# Patient Record
Sex: Male | Born: 1950 | Race: White | Hispanic: No | Smoking: Former smoker
Health system: Southern US, Community
[De-identification: ages and names within clinical notes are randomized; demographics above are authoritative.]

## PROBLEM LIST (undated history)

## (undated) DIAGNOSIS — H269 Unspecified cataract: Secondary | ICD-10-CM

## (undated) DIAGNOSIS — K219 Gastro-esophageal reflux disease without esophagitis: Secondary | ICD-10-CM

## (undated) DIAGNOSIS — K7460 Unspecified cirrhosis of liver: Secondary | ICD-10-CM

## (undated) DIAGNOSIS — I5022 Chronic systolic (congestive) heart failure: Secondary | ICD-10-CM

## (undated) DIAGNOSIS — I429 Cardiomyopathy, unspecified: Secondary | ICD-10-CM

## (undated) DIAGNOSIS — I10 Essential (primary) hypertension: Secondary | ICD-10-CM

## (undated) DIAGNOSIS — I4819 Other persistent atrial fibrillation: Secondary | ICD-10-CM

## (undated) DIAGNOSIS — R600 Localized edema: Secondary | ICD-10-CM

## (undated) DIAGNOSIS — I2782 Chronic pulmonary embolism: Secondary | ICD-10-CM

## (undated) DIAGNOSIS — I110 Hypertensive heart disease with heart failure: Secondary | ICD-10-CM

## (undated) DIAGNOSIS — J42 Unspecified chronic bronchitis: Secondary | ICD-10-CM

## (undated) DIAGNOSIS — I3139 Other pericardial effusion (noninflammatory): Secondary | ICD-10-CM

## (undated) DIAGNOSIS — R011 Cardiac murmur, unspecified: Secondary | ICD-10-CM

## (undated) DIAGNOSIS — J90 Pleural effusion, not elsewhere classified: Secondary | ICD-10-CM

## (undated) DIAGNOSIS — E042 Nontoxic multinodular goiter: Secondary | ICD-10-CM

## (undated) DIAGNOSIS — K8020 Calculus of gallbladder without cholecystitis without obstruction: Secondary | ICD-10-CM

## (undated) DIAGNOSIS — I350 Nonrheumatic aortic (valve) stenosis: Secondary | ICD-10-CM

## (undated) DIAGNOSIS — I313 Pericardial effusion (noninflammatory): Secondary | ICD-10-CM

## (undated) HISTORY — DX: Chronic systolic (congestive) heart failure: I50.22

## (undated) HISTORY — DX: Chronic pulmonary embolism: I27.82

## (undated) HISTORY — DX: Gastro-esophageal reflux disease without esophagitis: K21.9

## (undated) HISTORY — DX: Cardiomyopathy, unspecified: I42.9

## (undated) HISTORY — DX: Unspecified cataract: H26.9

## (undated) HISTORY — PX: OTHER SURGICAL HISTORY: SHX169

## (undated) HISTORY — DX: Nonrheumatic aortic (valve) stenosis: I35.0

## (undated) HISTORY — DX: Nontoxic multinodular goiter: E04.2

## (undated) HISTORY — DX: Pleural effusion, not elsewhere classified: J90

## (undated) HISTORY — DX: Unspecified cirrhosis of liver: K74.60

## (undated) HISTORY — DX: Other persistent atrial fibrillation: I48.19

## (undated) HISTORY — DX: Essential (primary) hypertension: I10

## (undated) HISTORY — DX: Cardiac murmur, unspecified: R01.1

## (undated) HISTORY — PX: BACK SURGERY: SHX140

## (undated) HISTORY — DX: Hypertensive heart disease with heart failure: I11.0

## (undated) HISTORY — DX: Other pericardial effusion (noninflammatory): I31.39

## (undated) HISTORY — DX: Localized edema: R60.0

## (undated) HISTORY — DX: Pericardial effusion (noninflammatory): I31.3

## (undated) HISTORY — DX: Calculus of gallbladder without cholecystitis without obstruction: K80.20

---
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
## 2015-04-28 DIAGNOSIS — Z1389 Encounter for screening for other disorder: Secondary | ICD-10-CM | POA: Diagnosis not present

## 2015-04-28 DIAGNOSIS — Z9181 History of falling: Secondary | ICD-10-CM | POA: Diagnosis not present

## 2015-04-28 DIAGNOSIS — I38 Endocarditis, valve unspecified: Secondary | ICD-10-CM | POA: Diagnosis not present

## 2015-04-28 DIAGNOSIS — Z6833 Body mass index (BMI) 33.0-33.9, adult: Secondary | ICD-10-CM | POA: Diagnosis not present

## 2015-04-28 DIAGNOSIS — Z79899 Other long term (current) drug therapy: Secondary | ICD-10-CM | POA: Diagnosis not present

## 2015-04-28 DIAGNOSIS — I10 Essential (primary) hypertension: Secondary | ICD-10-CM | POA: Diagnosis not present

## 2015-04-28 DIAGNOSIS — R609 Edema, unspecified: Secondary | ICD-10-CM | POA: Diagnosis not present

## 2015-05-05 DIAGNOSIS — R609 Edema, unspecified: Secondary | ICD-10-CM | POA: Diagnosis not present

## 2015-05-11 DIAGNOSIS — I340 Nonrheumatic mitral (valve) insufficiency: Secondary | ICD-10-CM | POA: Diagnosis not present

## 2015-05-11 DIAGNOSIS — I350 Nonrheumatic aortic (valve) stenosis: Secondary | ICD-10-CM | POA: Diagnosis not present

## 2015-05-11 DIAGNOSIS — I517 Cardiomegaly: Secondary | ICD-10-CM | POA: Diagnosis not present

## 2015-05-11 DIAGNOSIS — I38 Endocarditis, valve unspecified: Secondary | ICD-10-CM | POA: Diagnosis not present

## 2015-05-11 DIAGNOSIS — I272 Other secondary pulmonary hypertension: Secondary | ICD-10-CM | POA: Diagnosis not present

## 2015-05-11 DIAGNOSIS — I081 Rheumatic disorders of both mitral and tricuspid valves: Secondary | ICD-10-CM | POA: Diagnosis not present

## 2015-05-11 DIAGNOSIS — I351 Nonrheumatic aortic (valve) insufficiency: Secondary | ICD-10-CM | POA: Diagnosis not present

## 2015-05-14 DIAGNOSIS — I429 Cardiomyopathy, unspecified: Secondary | ICD-10-CM | POA: Diagnosis not present

## 2015-05-14 DIAGNOSIS — I5021 Acute systolic (congestive) heart failure: Secondary | ICD-10-CM | POA: Diagnosis not present

## 2015-05-14 DIAGNOSIS — E785 Hyperlipidemia, unspecified: Secondary | ICD-10-CM | POA: Diagnosis not present

## 2015-05-14 DIAGNOSIS — I350 Nonrheumatic aortic (valve) stenosis: Secondary | ICD-10-CM | POA: Diagnosis not present

## 2016-01-19 DIAGNOSIS — I10 Essential (primary) hypertension: Secondary | ICD-10-CM | POA: Diagnosis not present

## 2016-01-19 DIAGNOSIS — J45909 Unspecified asthma, uncomplicated: Secondary | ICD-10-CM | POA: Diagnosis not present

## 2016-01-19 DIAGNOSIS — I350 Nonrheumatic aortic (valve) stenosis: Secondary | ICD-10-CM | POA: Diagnosis not present

## 2016-01-19 DIAGNOSIS — Z6832 Body mass index (BMI) 32.0-32.9, adult: Secondary | ICD-10-CM | POA: Diagnosis not present

## 2016-01-19 DIAGNOSIS — Z79899 Other long term (current) drug therapy: Secondary | ICD-10-CM | POA: Diagnosis not present

## 2016-01-31 DIAGNOSIS — I130 Hypertensive heart and chronic kidney disease with heart failure and stage 1 through stage 4 chronic kidney disease, or unspecified chronic kidney disease: Secondary | ICD-10-CM | POA: Diagnosis not present

## 2016-01-31 DIAGNOSIS — E1122 Type 2 diabetes mellitus with diabetic chronic kidney disease: Secondary | ICD-10-CM | POA: Diagnosis not present

## 2016-01-31 DIAGNOSIS — I509 Heart failure, unspecified: Secondary | ICD-10-CM | POA: Diagnosis not present

## 2016-01-31 DIAGNOSIS — I4891 Unspecified atrial fibrillation: Secondary | ICD-10-CM | POA: Diagnosis not present

## 2016-01-31 DIAGNOSIS — I480 Paroxysmal atrial fibrillation: Secondary | ICD-10-CM | POA: Diagnosis not present

## 2016-01-31 DIAGNOSIS — I350 Nonrheumatic aortic (valve) stenosis: Secondary | ICD-10-CM | POA: Diagnosis not present

## 2016-01-31 DIAGNOSIS — R748 Abnormal levels of other serum enzymes: Secondary | ICD-10-CM | POA: Diagnosis not present

## 2016-01-31 DIAGNOSIS — N189 Chronic kidney disease, unspecified: Secondary | ICD-10-CM | POA: Diagnosis not present

## 2016-01-31 DIAGNOSIS — I129 Hypertensive chronic kidney disease with stage 1 through stage 4 chronic kidney disease, or unspecified chronic kidney disease: Secondary | ICD-10-CM | POA: Diagnosis not present

## 2016-01-31 DIAGNOSIS — I428 Other cardiomyopathies: Secondary | ICD-10-CM | POA: Diagnosis not present

## 2016-01-31 DIAGNOSIS — Z23 Encounter for immunization: Secondary | ICD-10-CM | POA: Diagnosis not present

## 2016-01-31 DIAGNOSIS — E118 Type 2 diabetes mellitus with unspecified complications: Secondary | ICD-10-CM | POA: Diagnosis not present

## 2016-01-31 DIAGNOSIS — N183 Chronic kidney disease, stage 3 (moderate): Secondary | ICD-10-CM | POA: Diagnosis not present

## 2016-01-31 DIAGNOSIS — I5023 Acute on chronic systolic (congestive) heart failure: Secondary | ICD-10-CM | POA: Diagnosis not present

## 2016-02-05 DIAGNOSIS — N183 Chronic kidney disease, stage 3 (moderate): Secondary | ICD-10-CM | POA: Diagnosis not present

## 2016-02-05 DIAGNOSIS — I428 Other cardiomyopathies: Secondary | ICD-10-CM | POA: Diagnosis not present

## 2016-02-05 DIAGNOSIS — E1122 Type 2 diabetes mellitus with diabetic chronic kidney disease: Secondary | ICD-10-CM | POA: Diagnosis not present

## 2016-02-05 DIAGNOSIS — R748 Abnormal levels of other serum enzymes: Secondary | ICD-10-CM | POA: Diagnosis not present

## 2016-02-05 DIAGNOSIS — I5023 Acute on chronic systolic (congestive) heart failure: Secondary | ICD-10-CM | POA: Diagnosis not present

## 2016-02-05 DIAGNOSIS — I4891 Unspecified atrial fibrillation: Secondary | ICD-10-CM | POA: Diagnosis not present

## 2016-02-05 DIAGNOSIS — I129 Hypertensive chronic kidney disease with stage 1 through stage 4 chronic kidney disease, or unspecified chronic kidney disease: Secondary | ICD-10-CM | POA: Diagnosis not present

## 2016-02-05 DIAGNOSIS — Z23 Encounter for immunization: Secondary | ICD-10-CM | POA: Diagnosis not present

## 2016-02-05 DIAGNOSIS — I350 Nonrheumatic aortic (valve) stenosis: Secondary | ICD-10-CM | POA: Diagnosis not present

## 2016-02-16 DIAGNOSIS — Z09 Encounter for follow-up examination after completed treatment for conditions other than malignant neoplasm: Secondary | ICD-10-CM | POA: Diagnosis not present

## 2016-02-16 DIAGNOSIS — Z6831 Body mass index (BMI) 31.0-31.9, adult: Secondary | ICD-10-CM | POA: Diagnosis not present

## 2016-02-16 DIAGNOSIS — I4891 Unspecified atrial fibrillation: Secondary | ICD-10-CM | POA: Diagnosis not present

## 2016-03-15 ENCOUNTER — Other Ambulatory Visit: Payer: Self-pay

## 2016-03-15 NOTE — Patient Outreach (Signed)
[facility]) Care Management  03/15/2016  Lucas Munoz May 15, 1951 [id number]  Telephone call to patient regarding [facility] referral.  Unable to reach patient. HIPAA compliant voice message left with call back phone number.   PLAN:  RNCM will attempt 2nd telephone call to patient within 1 week.  George Ina RN,BSN,CCM [facility] Telephonic  [phone]

## 2016-03-17 ENCOUNTER — Other Ambulatory Visit: Payer: Self-pay

## 2016-03-17 NOTE — Progress Notes (Signed)
This encounter was created in error - please disregard.

## 2016-03-17 NOTE — Patient Outreach (Signed)
[facility]) Care Management  03/17/2016  Lucas Munoz 10/04/1951 [id number]   SUBJECTIVE:  Telephone call to patient regarding [facility] referral.  HIPAA verified with patient. Discussed and offered [facility] care management services to patient. Patient verbally agreed to receive services by phone only at this time.  Patient states he went to the emergency room in March for flu like symptoms.  Patient states he was admitted to the hospital and was told he had an irregular heart beat and a heart valve problem.  Patient states he did follow up with his primary MD after discharge from the hospital.  Patient denies having follow up with any cardiologist.  Patient states he has ongoing symptoms of shortness of breath with activity and stomach pain.  Patient states his primary MD is aware of this. Patient states he is on a blood pressure medication and fluid pill.  Patient reports he has fluid retention as well. Patient states he takes his medications as prescribed. Patient states he knows his doctor wants him to follow up with a cardiologist. Patient states he is going to check his [facility] booklet and find a cardiologist in [place], [place]. Patient states he is considering taking medical leave soon so that he can work on managing his health care/conditions.  Patient states he is going to take his time so that he can make the right medical decisions for himself. Patient states he is monitoring his diet and eating better.  Patient states he is making a few changes in his life to see if it will help with his health conditions.  Patient states he has never been told any specifics regarding his medications.  Patient states he has questions regarding his medications. Patient verbally agreed to having [facility] care management pharmacy contact his to discuss his concerns regarding his medications.  Patient states he has been relatively healthy all of his life.  Patient states he was diagnosed with a heart murmur when  he tried to join the [employer].   Patient agreed to receive Advance directive information.   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   RNCM advised patient to schedule follow up appointment with his primary MD.  [facility] advised patient that his primary MD would have to write a referral to the cardiologist. Patient states he is aware and would have his primary MD write a referral once he finds one.  Patient verbalized agreement to next telephone outreach with [facility].   ASSESSMENT: Status post hospitalization for flu with findings of irregular heart beat and valve concerns per patient.  Patient would benefit from ongoing follow up by telephonic RNCM.    PLAN:  RNCM will open patients case to telephonic case management RNCM will follow up with patient within 1 month.  RNCM will send patients primary MD letter alerting to [facility] care management involvement. RNCM will send patient [facility] care management packet, consent form, advance directive, and EMMI education material.  George Ina RN,BSN,CCM [facility] Telephonic  [phone]

## 2016-03-23 ENCOUNTER — Other Ambulatory Visit: Payer: Self-pay

## 2016-03-23 DIAGNOSIS — Z6834 Body mass index (BMI) 34.0-34.9, adult: Secondary | ICD-10-CM | POA: Diagnosis not present

## 2016-03-23 DIAGNOSIS — K219 Gastro-esophageal reflux disease without esophagitis: Secondary | ICD-10-CM | POA: Diagnosis not present

## 2016-03-23 DIAGNOSIS — R0602 Shortness of breath: Secondary | ICD-10-CM | POA: Insufficient documentation

## 2016-03-23 DIAGNOSIS — Z79899 Other long term (current) drug therapy: Secondary | ICD-10-CM | POA: Diagnosis not present

## 2016-03-23 DIAGNOSIS — R600 Localized edema: Secondary | ICD-10-CM

## 2016-03-23 DIAGNOSIS — I350 Nonrheumatic aortic (valve) stenosis: Secondary | ICD-10-CM | POA: Diagnosis not present

## 2016-03-23 DIAGNOSIS — R609 Edema, unspecified: Secondary | ICD-10-CM

## 2016-03-23 DIAGNOSIS — I10 Essential (primary) hypertension: Secondary | ICD-10-CM | POA: Diagnosis not present

## 2016-03-23 NOTE — Patient Outreach (Addendum)
[facility]) Care Management  [facility] Care Manager  03/23/2016   Lucas Munoz 1950/12/11 [id number]  Subjective: Telephone call to patient regarding [facility] referral.  HIPAA verified with patient. Patient states he submitted his application for Medicaid and has signed HIPAA forms. Patient states he had appointment today with his primary care provider. Patient states he was very pleased with the way his appointment went. Patient states his primary MD  Increased his lasix  To 40 mg 2 times per day for the fluid retention and prescribed Nexium  daily due to his chronic abdominal pain.   Patient states his doctor referred him to a cardiologist but he is unable to recall the name.  Patient states he is awaiting call from the cardiology office for an appointment. Patient states his doctor is going to rerun test. Patient states he is unsure of the specific test at this time.  Patient states his doctor recommended him not to eat any dairy products and gave additional him dietary choices. Patient states he has been gaining a lot of weight over the last 1 1/2 year with increased shortness of breath.  Patient states now everything has gotten worse which is effecting his life.  Patient states he discussed his medications with his doctor today.  Patient  States he does not need any additional follow up at this time with [facility] pharmacy services.  RNCM offered and encouraged patient to receive follow up with a community case Production designer, theatre/television/film. Patient verbally agreed.    Objective: see assessment  Encounter Medications:  Outpatient Encounter Prescriptions as of 03/23/2016  Medication Sig Note  . carvedilol (COREG) 12.5 MG tablet Take 12.5 mg by mouth 2 (two) times daily with a meal.   . esomeprazole (NEXIUM) 40 MG capsule Take 40 mg by mouth daily at 12 noon.   . famotidine (PEPCID) 10 MG tablet Take 10 mg by mouth daily. Patient states he takes pecid  1-2 times per day depending on stomach pain.   .  furosemide (LASIX) 40 MG tablet Take 40 mg by mouth 2 (two) times daily.   Marland Kitchen ibuprofen (ADVIL,MOTRIN) 200 MG tablet Take 200 mg by mouth every 6 (six) hours as needed. Patient states he takes 2 tablets as needed for pain   . esomeprazole (NEXIUM) 20 MG capsule Take 20 mg by mouth daily at 12 noon. Reported on 03/23/2016 03/23/2016: Patient states he takes nexium  tablet 1 time per day  . furosemide (LASIX) 20 MG tablet Take 20 mg by mouth 2 (two) times daily. Reported on 03/23/2016 03/23/2016: Patient states he takes lasix 40 mg tablet 2 times per day.    No facility-administered encounter medications on file as of 03/23/2016.    Functional Status:  In your present state of health, do you have any difficulty performing the following activities: 03/23/2016  Hearing? N  Vision? N  Difficulty concentrating or making decisions? N  Walking or climbing stairs? Y  Dressing or bathing? N  Doing errands, shopping? Y  Preparing Food and eating ? N  Using the Toilet? N  In the past six months, have you accidently leaked urine? N  Do you have problems with loss of bowel control? N  Managing your Medications? N  Managing your Finances? N  Housekeeping or managing your Housekeeping? N    Fall/Depression Screening: PHQ 2/9 Scores 03/17/2016  PHQ - 2 Score 2  PHQ- 9 Score 3    Fall Risk  03/23/2016 03/17/2016  Falls in the past year? No  No    Assessment: Referral from [facility] for ongoing case management/ disease management related to health conditions.  Patient has agreed to follow up with a [facility] community case Production designer, theatre/television/film.   Plan: RNCM will refer patient to community case manager

## 2016-03-24 ENCOUNTER — Other Ambulatory Visit: Payer: Self-pay

## 2016-03-24 NOTE — Patient Outreach (Signed)
Care coordination for home visit: Placed call to patient. Explained purpose of call.  Offered home visit for 03/27/2016 and patient has accepted.  Plan: Will see patient on 03/27/2016 for home visit. Address confirmed. Provided my contact information.  Rowe Pavy, RN, BSN, CEN [facility] [employer] [phone]

## 2016-03-27 ENCOUNTER — Other Ambulatory Visit: Payer: Self-pay

## 2016-03-27 NOTE — Patient Outreach (Signed)
[facility]) Care Management   03/27/2016  Curren Lucas Munoz 28, 1952 [id number]  [name] is an 65 y.o. male 11am arrived for home visit.  Subjective:  Patient reports that he is having a hard time with fluid retention. States that his fluid retention has gotten worse over the last several months.  Patient reports that he was admitted to the hospital for fluid overload about 2 months ago. Patient reports that he saw his primary MD last week. Reports Lasix was increased.  Patient reports that he is not taking a blood thinner for his irregular heart beat because he was waiting for a second opinion for another cardiologist.  Reports he is waiting for a call to know when to go see a new cardiologist.  Patient reports that he is currently on FMLA due to his recent worsening condition. 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  Patient reports swelling in legs is not really getting any better. States he is watching his salt intake and taking his medications as prescribed.   Patient reports that he is very independent; with a change in his health is has to take frequent breaks. Reports he gets short of breath when walking to mail box.   Objective:  Awake and alert. Pleasant. Sitting in his duplex. Home neat and clean.  Filed Vitals:   03/27/16 1134  BP: 108/62  Pulse: 95  Resp: 18  Height: 1.753 m (5\' 9" )  Weight: 214 lb (97.07 kg)  SpO2: 97%   Review of Systems  Constitutional: Positive for malaise/fatigue.  Respiratory: Positive for shortness of breath.   Cardiovascular: Positive for leg swelling.  Genitourinary: Positive for frequency.  Musculoskeletal: Negative.   Neurological: Negative.   Endo/Heme/Allergies: Negative.   Psychiatric/Behavioral: Negative.     Physical Exam  Constitutional: He is oriented to person, place, and time. He appears well-developed and well-nourished.  Cardiovascular: Normal rate and intact distal pulses.   Irregular  pulse apically  Respiratory: Effort normal and breath sounds normal.  Lungs clear  GI: Soft. Bowel sounds are normal.  Musculoskeletal: Normal range of motion. He exhibits edema.  3 plus edema above the knees.  Unable to assess any higher level of edema due to clothing  Neurological: He is alert and oriented to person, place, and time.  Skin: Skin is warm and dry.  Skin dry to lower legs and feet  Psychiatric: He has a normal mood and affect. His behavior is normal. Judgment and thought content normal.    Encounter Medications:   Outpatient Encounter Prescriptions as of 03/27/2016  Medication Sig Note  . carvedilol (COREG) 12.5 MG tablet Take 12.5 mg by mouth 2 (two) times daily with a meal.   . esomeprazole (NEXIUM) 40 MG capsule Take 40 mg by mouth daily at 12 noon.   . furosemide (LASIX) 40 MG tablet Take 40 mg by mouth 2 (two) times daily.   Marland Kitchen ibuprofen (ADVIL,MOTRIN) 200 MG tablet Take 200 mg by mouth every 6 (six) hours as needed. Patient states he takes 2 tablets as needed for pain   . esomeprazole (NEXIUM) 20 MG capsule Take 20 mg by mouth daily at 12 noon. Reported on 03/27/2016 03/23/2016: Patient states he takes nexium 40mg  tablet 1 time per day  . famotidine (PEPCID) 10 MG tablet Take 10 mg by mouth daily. Reported on 03/27/2016   . furosemide (LASIX) 20 MG tablet Take 20 mg by mouth 2 (two) times daily. Reported on 03/27/2016 03/23/2016:  Patient states he takes lasix 40 mg tablet 2 times per day.    No facility-administered encounter medications on file as of 03/27/2016.    Functional Status:   In your present state of health, do you have any difficulty performing the following activities: 03/27/2016 03/23/2016  Hearing? N N  Vision? Y N  Difficulty concentrating or making decisions? N N  Walking or climbing stairs? Y Y  Dressing or bathing? N N  Doing errands, shopping? Lucas Munoz  Preparing Food and eating ? N N  Using the Toilet? N N  In the past six months, have you accidently  leaked urine? N N  Do you have problems with loss of bowel control? N N  Managing your Medications? N N  Managing your Finances? N N  Housekeeping or managing your Housekeeping? N N    Fall/Depression Screening:    PHQ 2/9 Scores 03/27/2016 03/17/2016  PHQ - 2 Score 2 2  PHQ- 9 Score 3 3   Fall Risk  03/27/2016 03/23/2016 03/17/2016  Falls in the past year? No No No    Assessment:   (1) reviewed [facility] program.  Patient reports that he has read the material that was sent by mail. States he already has calendar and new patient packet. Provided my contact care.  Written Consent obtained. (2) weighing daily with clothes. (3) pending a new cardiology follow up. (4) not taking his anticoagulant. (5) lack of knowledge about CHF daily management.  Plan:  (1) consent scanned into chart. (2)reviewed with patient the correct technique and weighing and recording weights. Todays weight record in [facility] calendar. (3) placed call to Dr. Blair Dolphin office and spoke with Andria Rhein in regards to second opinion requested by patient. (4) reminded patient of the reasons to take all meds as prescribed.  (5) Provided CHF education including the importance of daily weights and low salt diet. Reviewed heart failure zones.  Next home visit planned for 2 weeks for close follow up 04/11/2016 This note sent to MD.  Collaborative goal planning and care planning during home visit. Primary goal to to avoid hospital admission.  Goals and next appointment written in [facility] calendar for patient.   THN CM Care Plan Problem One        Most Recent Value   Care Plan Problem One  Knowledge deficit about daily management of heart failure   Role Documenting the Problem One  Care Management Coordinator   Care Plan for Problem One  Active   THN Long Term Goal (31-90 days)  Patient will verbalize no readmissions in the next 90 days.   THN Long Term Goal Start Date  03/27/16   Interventions for Problem One Long Term Goal  Reviewed  importance of daily management. Home visit completed. Update provided to primary MD. Reviewed heart failure zones and low salt diet.   THN CM Short Term Goal #1 (0-30 days)  Patient will weigh and record daily weights for the next 30 days.   THN CM Short Term Goal #1 Start Date  03/27/16   Interventions for Short Term Goal #1  reviewed importance of daily weights. discussed heart failure zones and when to call MD for weight gain greater than 3 pounds overnight or 5 pounds in a week.    THN CM Short Term Goal #2 (0-30 days)  Patient will follow low salt diet for the next 30 days.   THN CM Short Term Goal #2 Start Date  03/27/16   Interventions for Short  Term Goal #2  Provided THN tear off poster about low salt diet. Reviewed with patient the reasons for a low salt diet.   THN CM Short Term Goal #3 (0-30 days)  Patient will call Md for weight gain in the next 30 days.   THN CM Short Term Goal #3 Start Date  03/27/16   Interventions for Short Tern Goal #3  Reviewed reasons to call md. Provided [facility] calendar for recording weights and reasons to call md.      Rowe Pavy, RN, BSN, CEN [facility] Coordinator [phone]

## 2016-03-28 ENCOUNTER — Other Ambulatory Visit: Payer: Self-pay

## 2016-03-28 NOTE — Patient Outreach (Signed)
Care Coordination: Placed call to Dr.Campbells office and spoke with Andria Rhein about patients requested second opinion.  Andria Rhein will send message to MD to request referral.  I informed Andria Rhein that patient did not want to see [facility] Cardiology in particular Dr. Tomie China.  PLAN: Will continue to follow up with referral.  .Rowe Pavy, RN, BSN, CEN [facility] Coordinator [phone]

## 2016-04-03 ENCOUNTER — Telehealth: Payer: Self-pay | Admitting: Cardiovascular Disease

## 2016-04-03 NOTE — Telephone Encounter (Signed)
Received records from [facility] for appointment on 04/05/16 with Dr Tresa Endo.  Records given to [facility] (medical records) for Dr Landry Dyke schedule on 04/05/16. lp

## 2016-04-05 ENCOUNTER — Encounter: Payer: Self-pay | Admitting: Cardiovascular Disease

## 2016-04-05 ENCOUNTER — Ambulatory Visit (INDEPENDENT_AMBULATORY_CARE_PROVIDER_SITE_OTHER): Payer: Commercial Managed Care - HMO | Admitting: Cardiovascular Disease

## 2016-04-05 VITALS — BP 120/72 | HR 110 | Ht 69.0 in | Wt 207.4 lb

## 2016-04-05 DIAGNOSIS — R609 Edema, unspecified: Secondary | ICD-10-CM | POA: Diagnosis not present

## 2016-04-05 DIAGNOSIS — I350 Nonrheumatic aortic (valve) stenosis: Secondary | ICD-10-CM

## 2016-04-05 DIAGNOSIS — R300 Dysuria: Secondary | ICD-10-CM

## 2016-04-05 DIAGNOSIS — R160 Hepatomegaly, not elsewhere classified: Secondary | ICD-10-CM | POA: Diagnosis not present

## 2016-04-05 DIAGNOSIS — R601 Generalized edema: Secondary | ICD-10-CM

## 2016-04-05 DIAGNOSIS — Z7189 Other specified counseling: Secondary | ICD-10-CM

## 2016-04-05 DIAGNOSIS — I10 Essential (primary) hypertension: Secondary | ICD-10-CM

## 2016-04-05 DIAGNOSIS — Z79899 Other long term (current) drug therapy: Secondary | ICD-10-CM

## 2016-04-05 DIAGNOSIS — R1010 Upper abdominal pain, unspecified: Secondary | ICD-10-CM | POA: Diagnosis not present

## 2016-04-05 DIAGNOSIS — I481 Persistent atrial fibrillation: Secondary | ICD-10-CM

## 2016-04-05 DIAGNOSIS — I4819 Other persistent atrial fibrillation: Secondary | ICD-10-CM

## 2016-04-05 DIAGNOSIS — K219 Gastro-esophageal reflux disease without esophagitis: Secondary | ICD-10-CM

## 2016-04-05 DIAGNOSIS — R600 Localized edema: Secondary | ICD-10-CM

## 2016-04-05 LAB — COMPREHENSIVE METABOLIC PANEL
ALK PHOS: 129 U/L — AB (ref 40–115)
ALT: 13 U/L (ref 9–46)
AST: 22 U/L (ref 10–35)
Albumin: 3.8 g/dL (ref 3.6–5.1)
BUN: 23 mg/dL (ref 7–25)
CALCIUM: 9.4 mg/dL (ref 8.6–10.3)
CHLORIDE: 99 mmol/L (ref 98–110)
CO2: 32 mmol/L — AB (ref 20–31)
Creat: 1.35 mg/dL — ABNORMAL HIGH (ref 0.70–1.25)
GLUCOSE: 109 mg/dL — AB (ref 65–99)
POTASSIUM: 4 mmol/L (ref 3.5–5.3)
Sodium: 142 mmol/L (ref 135–146)
Total Bilirubin: 4 mg/dL — ABNORMAL HIGH (ref 0.2–1.2)
Total Protein: 7.3 g/dL (ref 6.1–8.1)

## 2016-04-05 LAB — CBC
HEMATOCRIT: 43.7 % (ref 38.5–50.0)
Hemoglobin: 14 g/dL (ref 13.2–17.1)
MCH: 29.5 pg (ref 27.0–33.0)
MCHC: 32 g/dL (ref 32.0–36.0)
MCV: 92.2 fL (ref 80.0–100.0)
MPV: 12.6 fL — ABNORMAL HIGH (ref 7.5–12.5)
Platelets: 161 10*3/uL (ref 140–400)
RBC: 4.74 MIL/uL (ref 4.20–5.80)
RDW: 16 % — AB (ref 11.0–15.0)
WBC: 6.5 10*3/uL (ref 3.8–10.8)

## 2016-04-05 LAB — TSH: TSH: 1.37 mIU/L (ref 0.40–4.50)

## 2016-04-05 LAB — MAGNESIUM: Magnesium: 2.2 mg/dL (ref 1.5–2.5)

## 2016-04-05 MED ORDER — APIXABAN 5 MG PO TABS: 5.0000 mg | ORAL_TABLET | Freq: Two times a day (BID) | ORAL | Status: DC

## 2016-04-05 MED ORDER — CARVEDILOL 12.5 MG PO TABS: 25.0000 mg | ORAL_TABLET | Freq: Two times a day (BID) | ORAL | Status: DC

## 2016-04-05 MED ORDER — FUROSEMIDE 80 MG PO TABS: ORAL_TABLET | ORAL | Status: DC

## 2016-04-05 NOTE — Patient Instructions (Signed)
Your physician has requested that you have an echocardiogram. Echocardiography is a painless test that uses sound waves to create images of your heart. It provides your doctor with information about the size and shape of your heart and how well your heart's chambers and valves are working. This procedure takes approximately one hour. There are no restrictions for this procedure.  Your physician recommends that you return for lab work TODAY.  Your physician has recommended you make the following change in your medication:   1.) start new prescription for Eliquis 5 mg.  2.) the  Furosemide has been increased to 80 mg in the morning and 40 mg in the evening.  3.) the carvedilol has been increased to 1 & 1/2 tablets twice a day for 1 week then 2 tablets twice a day  Your physician has requested that you have a lower  extremity venous duplex. This test is an ultrasound of the veins in the legs or arms. It looks at venous blood flow that carries blood from the heart to the legs or arms. Allow one hour for a Lower Venous exam. Allow thirty minutes for an Upper Venous exam. There are no restrictions or special instructions.  Your physician has requested that you have a lower  extremity arterial duplex. This test is an ultrasound of the arteries in the legs or arms. It looks at arterial blood flow in the legs and arms. Allow one hour for Lower and Upper Arterial scans. There are no restrictions or special instructions.  Your physician recommends that you schedule a follow-up appointment in:  3 weeks.   A abdominal CT scan has been ordered to look at your liver.and evaluate your abdominal pain.

## 2016-04-06 LAB — URINALYSIS, ROUTINE W REFLEX MICROSCOPIC
BILIRUBIN URINE: NEGATIVE
GLUCOSE, UA: NEGATIVE
Hgb urine dipstick: NEGATIVE
Ketones, ur: NEGATIVE
Leukocytes, UA: NEGATIVE
Nitrite: NEGATIVE
SPECIFIC GRAVITY, URINE: 1.021 (ref 1.001–1.035)
pH: 6.5 (ref 5.0–8.0)

## 2016-04-06 LAB — BRAIN NATRIURETIC PEPTIDE: Brain Natriuretic Peptide: 1711.3 pg/mL — ABNORMAL HIGH (ref <100)

## 2016-04-06 LAB — URINALYSIS, MICROSCOPIC ONLY
Bacteria, UA: NONE SEEN [HPF]
CASTS: NONE SEEN [LPF]
CRYSTALS: NONE SEEN [HPF]
Squamous Epithelial / LPF: NONE SEEN [HPF] (ref <=5)
Yeast: NONE SEEN [HPF]

## 2016-04-09 ENCOUNTER — Encounter: Payer: Self-pay | Admitting: Cardiovascular Disease

## 2016-04-09 DIAGNOSIS — R160 Hepatomegaly, not elsewhere classified: Secondary | ICD-10-CM | POA: Insufficient documentation

## 2016-04-09 DIAGNOSIS — R109 Unspecified abdominal pain: Secondary | ICD-10-CM | POA: Insufficient documentation

## 2016-04-09 DIAGNOSIS — I350 Nonrheumatic aortic (valve) stenosis: Secondary | ICD-10-CM | POA: Insufficient documentation

## 2016-04-09 DIAGNOSIS — I4819 Other persistent atrial fibrillation: Secondary | ICD-10-CM | POA: Insufficient documentation

## 2016-04-09 DIAGNOSIS — Z7189 Other specified counseling: Secondary | ICD-10-CM | POA: Insufficient documentation

## 2016-04-09 DIAGNOSIS — K219 Gastro-esophageal reflux disease without esophagitis: Secondary | ICD-10-CM | POA: Insufficient documentation

## 2016-04-09 DIAGNOSIS — R600 Localized edema: Secondary | ICD-10-CM | POA: Insufficient documentation

## 2016-04-09 NOTE — Progress Notes (Signed)
Patient ID: Lucas Munoz, male   DOB: 1951-07-04, 65 y.o.   MRN: [id number]     Primary MD: Dr. Jenean Lindau  PATIENT PROFILE: Lucas Munoz is a 65 y.o. male  who presents to establish cardiology care with me.  Remotely, the patient had been seen in [place] by Dr. Ballard Russell.  Chief complaint is progressive lower extremety edema, abdominal pain, and irregular heart rhythm.   HPI:  Lucas Munoz move to [place] from [place] to have fevers.  Ago.  He admits to long-standing history of hypertension.  He hasn't had issues with lower extremity edema and in the past had taken Lasix.  Several years ago.  He states his edema was very significant to the point where he was having significant scrotal edema.  He also was found to have increasing episodes of shortness of breath.  Previously was found to have an aortic murmur and was felt to have mild aortic stenosis.  Had lost confidence in his cardiologist in [place].  He tells he was recently hospitalized at [facility].  During that evaluation, he was in atrial fibrillation and had stage II hypertension.  Most recently he has been on carvedilol 12.5 mg twice a day, Pepcid 10 mg for GERD, and furosemide 40 mg twice a day.  He may have been given a prescription for eliquis but he  never started on anticoagulation.  Since hospital discharge, he continues to note shortness of breath.  His pulses are regular.  He denies any episodes of chest pain.  He has experienced intermittent abdominal pain.  He also has tense lower extremity edema.  He presents for evaluation.  Past Medical History  Diagnosis Date  . Cataract     right side per patient  . Heart murmur   . Hypertension   . Fluid retention   . CHF (congestive heart failure) ([place])   . GERD (gastroesophageal reflux disease)   . Atrial fibrillation [facility])     Past Surgical History  Procedure Laterality Date  . Back surgery      at age 68 years old  . Goiter removed      patient states at age 56    . Tonsillectomy      patient states at age 38    No Known Allergies  Current Outpatient Prescriptions  Medication Sig Dispense Refill  . carvedilol (COREG) 12.5 MG tablet Take 2 tablets (25 mg total) by mouth 2 (two) times daily with a meal. 60 tablet 6  . famotidine (PEPCID) 10 MG tablet Take 10 mg by mouth daily. Reported on 03/27/2016    . ibuprofen (ADVIL,MOTRIN) 200 MG tablet Take 200 mg by mouth every 6 (six) hours as needed. Patient states he takes 2 tablets as needed for pain    . apixaban (ELIQUIS) 5 MG TABS tablet Take 1 tablet (5 mg total) by mouth 2 (two) times daily. 60 tablet 6  . furosemide (LASIX) 80 MG tablet Take 1 tablet in the AM and 1/2 tablet in the evening 45 tablet 6   No current facility-administered medications for this visit.    Social History   Social History  . Marital Status: Unknown    Spouse Name: N/A  . Number of Children: N/A  . Years of Education: N/A   Occupational History  . Not on file.   Social History Main Topics  . Smoking status: Former Smoker -- 1.00 packs/day for 10 years    Types: Cigarettes    Quit date: 04/05/2000  .  Smokeless tobacco: Not on file  . Alcohol Use: No  . Drug Use: No  . Sexual Activity: Not on file   Other Topics Concern  . Not on file   Social History Narrative   Additional social history is notable in that he was born in [place].  He lived in [place] for 20 years.  2 half years ago.  He moved to [place] since his daughter is in [place].  He is divorced for 18 years.  He has 2 children and 3 grandchildren.  He lives by himself.  Previously had worked in the produce department at [employer]  He completed 2 years of college.  There is no tobacco use.  Recently, he has not been able to exercise.  Family History  Problem Relation Age of Onset  . Cancer Mother   . Heart disease Father   . Alcohol abuse Brother   . Cancer Brother   . Cancer Maternal Grandmother   . Alcohol abuse Maternal Grandfather    . Alcohol abuse Paternal Grandfather    Additional family history is notable that his mother died at age 57 and had brain cancer.  His father died at 58 with cancer.  2 brothers are deceased, one at age 76 1 at 14 in both had cancer.  He has a sister who is 78 and alive.  ROS General: Negative; No fevers, chills, or night sweats HEENT: Negative; No changes in vision or hearing, sinus congestion, difficulty swallowing Pulmonary: Negative; No cough, wheezing, shortness of breath, hemoptysis Cardiovascular:  See HPI;  Positive for severe lower extremity edema and in the past significant scrotal edema. GI: Positive for abdominal pain; GERD GU: Negative; No dysuria, hematuria, or difficulty voiding Musculoskeletal: Negative; no myalgias, joint pain, or weakness Hematologic/Oncologic: Negative; no easy bruising, bleeding Endocrine: Negative; no heat/cold intolerance; no diabetes Neuro: Negative; no changes in balance, headaches Skin: Negative; No rashes or skin lesions Psychiatric: Negative; No behavioral problems, depression Sleep: Negative; No daytime sleepiness, hypersomnolence, bruxism, restless legs, hypnogagnic hallucinations Other comprehensive 14 point system review is negative   Physical Exam BP 120/72 mmHg  Pulse 110  Ht 5' 9"  (1.753 m)  Wt 207 lb 6.4 oz (94.076 kg)  BMI 30.61 kg/m2  Wt Readings from Last 3 Encounters:  04/05/16 207 lb 6.4 oz (94.076 kg)  03/27/16 214 lb (97.07 kg)  03/23/16 220 lb (99.791 kg)   General: Alert, oriented, no distress.  Skin: normal turgor, no rashes, warm and dry HEENT: Normocephalic, atraumatic. Pupils equal round and reactive to light; sclera anicteric; extraocular muscles intact; Fundi No hemorrhages or exudates.  Disks flat.  Right eye cataract. Nose without nasal septal hypertrophy Mouth/Parynx benign; Mallinpatti scale 2 Neck: JVD 7-8 cm, no carotid bruits; normal carotid upstroke Lungs: clear to ausculatation and percussion; no  wheezing or rales Chest wall: without tenderness to palpitation Heart: Irregularly irregular rhythm in the 90s., s1 s2 normal, 2-0/9 systolic murmur in the aortic area, no diastolic murmur, no rubs, gallops, thrills, or heaves Abdomen: The patient's liver appears large and seems to be at least 3 fingers below the right costal margin, BS+; abdominal aorta nontender and not dilated by palpation. Back: no CVA tenderness Pulses 2+ Musculoskeletal: full range of motion, normal strength, no joint deformities Extremities: Very tense 3-4+ lower extremity edema bilaterally, Homan's sign negative  Neurologic: grossly nonfocal; Cranial nerves grossly wnl Psychologic: Normal mood and affect   ECG (independently read by me): Atrial fibrillation at 96 bpm.  One PVC versus  apparent complex.  QTc interval 480 ms.  LABS:  BMP Latest Ref Rng 04/05/2016  Glucose 65 - 99 mg/dL 109(H)  BUN 7 - 25 mg/dL 23  Creatinine 0.70 - 1.25 mg/dL 1.35(H)  Sodium 135 - 146 mmol/L 142  Potassium 3.5 - 5.3 mmol/L 4.0  Chloride 98 - 110 mmol/L 99  CO2 20 - 31 mmol/L 32(H)  Calcium 8.6 - 10.3 mg/dL 9.4    Hepatic Function Latest Ref Rng 04/05/2016  Total Protein 6.1 - 8.1 g/dL 7.3  Albumin 3.6 - 5.1 g/dL 3.8  AST 10 - 35 U/L 22  ALT 9 - 46 U/L 13  Alk Phosphatase 40 - 115 U/L 129(H)  Total Bilirubin 0.2 - 1.2 mg/dL 4.0(H)    CBC Latest Ref Rng 04/05/2016  WBC 3.8 - 10.8 K/uL 6.5  Hemoglobin 13.2 - 17.1 g/dL 14.0  Hematocrit 38.5 - 50.0 % 43.7  Platelets 140 - 400 K/uL 161   Lab Results  Component Value Date   MCV 92.2 04/05/2016   Lab Results  Component Value Date   TSH 1.37 04/05/2016   No results found for: HGBA1C   BNP    Component Value Date/Time   BNP 1711.3* 04/05/2016 1303    ProBNP No results found for: PROBNP   Lipid Panel  No results found for: CHOL, TRIG, HDL, CHOLHDL, VLDL, LDLCALC, LDLDIRECT  RADIOLOGY: No results found.   ASSESSMENT AND PLAN: Mr. Dewarren Ledbetter is a  65 year old male who admits to a long-standing history of hypertension and difficulty with significant lower extremity edema.  His ECG today confirms atrial fibrillation with ventricular rate in the 90s and he is not on anticoagulation therapy.  I am further titrating his carvedilol from 12.5 mg twice a day to 18.76m twice a day for the next week and then he will increase this to 25 mg twice a day.  I am starting him on eliquis 5 mg twice a day for anticoagulation of his atrial fibrillation.  He has a cardiac murmur compatible with his aortic valve disease.  I will schedule him for an echo Doppler study to reevaluate LV systolic and diastolic function and aortic stenosis.  And that he may have an enlarged liver.  He has been having recurrent episodes of abdominal pain.  I am scheduling him for an abdominal CT for further evaluation.  I will check laboratory today consisting of a CMP, CBC, magnesium level, TSH, brain natruretic peptide, and will also obtain a urinalysis.  With his 3- 4+ tense lower extremity edema.  I am scheduling him for both venous and arterial Doppler studies of his lower extremities.  I recommended further titration of his a.m. furosemide to 80 mg in the morning and he will continue to take 40 mg in late afternoon.  I will see him in 3 weeks for reevaluation and further recommendations will be made at that time.   TTroy Sine MD, [facility]: [place]onda W5/28/2017 2:36 PM

## 2016-04-11 ENCOUNTER — Ambulatory Visit (INDEPENDENT_AMBULATORY_CARE_PROVIDER_SITE_OTHER)
Admission: RE | Admit: 2016-04-11 | Discharge: 2016-04-11 | Disposition: A | Discharge status: Home / Self Care | Payer: Commercial Managed Care - HMO | Source: Ambulatory Visit | Attending: Cardiovascular Disease | Admitting: Cardiovascular Disease

## 2016-04-11 ENCOUNTER — Other Ambulatory Visit: Payer: Self-pay

## 2016-04-11 ENCOUNTER — Ambulatory Visit: Payer: Self-pay

## 2016-04-11 DIAGNOSIS — R601 Generalized edema: Secondary | ICD-10-CM

## 2016-04-11 DIAGNOSIS — K8020 Calculus of gallbladder without cholecystitis without obstruction: Secondary | ICD-10-CM | POA: Diagnosis not present

## 2016-04-11 DIAGNOSIS — R160 Hepatomegaly, not elsewhere classified: Secondary | ICD-10-CM

## 2016-04-11 MED ORDER — IOPAMIDOL (ISOVUE-300) INJECTION 61%
80.0000 mL | Freq: Once | INTRAVENOUS | Status: AC | PRN
  Administered 2016-04-11: 100 mL via INTRAVENOUS

## 2016-04-11 NOTE — Patient Outreach (Signed)
Telephone call: Placed call to patient to confirm appointment. Patient has testing ordered by cardiologist today.  Cancelled home visit and completed telephone assessment.  Home visit rescheduled for June 14th.  UPDATE SINCE HOME VISIT: Patient reports that he got a referral to [facility] and saw Dr. Tresa Endo. Patient reports that he likes Dr. Tresa Endo a lot and feels like his heart failure is getting better. Patient reports that he has lost 24 pounds and is breathing much better. States that he has less swelling from his thighs up.  States that he still has some swelling to his calves but reports feeling much better.  Patient reports that he is eating better and sleeping better.   Reports that he is planning to have doppler studies, echo and a CT scan of his liver. Dopplers are today.   Patient reports that he is very pleased and feels much better.  PLAN: rescheduled home visit for June 14th. Encouraged patient to call me for any additional concerns. Will send this note to primary MD.   [facility] CM Care Plan Problem One        Most Recent Value   Care Plan Problem One  Knowledge deficit about daily management of heart failure   Role Documenting the Problem One  Care Management Coordinator   Care Plan for Problem One  Active   THN Long Term Goal (31-90 days)  Patient will verbalize no readmissions in the next 90 days.   THN Long Term Goal Start Date  03/27/16   Interventions for Problem One Long Term Goal  Reviewed importance of daily management. Home visit completed. Update provided to primary MD. Reviewed heart failure zones and low salt diet.   THN CM Short Term Goal #1 (0-30 days)  Patient will weigh and record daily weights for the next 30 days.   THN CM Short Term Goal #1 Start Date  03/27/16   Interventions for Short Term Goal #1  reinforced need for daily weights   THN CM Short Term Goal #2 (0-30 days)  Patient will follow low salt diet for the next 30 days.   THN CM Short Term Goal  #2 Start Date  03/27/16   Interventions for Short Term Goal #2  encouraged patient to follow low salt diet.   THN CM Short Term Goal #3 (0-30 days)  Patient will call Md for weight gain in the next 30 days.   THN CM Short Term Goal #3 Start Date  03/27/16   Interventions for Short Tern Goal #3  Reviewed reasons to call md. Provided [facility] calendar for recording weights and reasons to call md.     Rowe Pavy, RN, BSN, CEN [facility] Coordinator [phone]

## 2016-04-25 ENCOUNTER — Encounter: Payer: Self-pay | Admitting: Cardiovascular Disease

## 2016-04-25 ENCOUNTER — Ambulatory Visit (INDEPENDENT_AMBULATORY_CARE_PROVIDER_SITE_OTHER): Payer: Commercial Managed Care - HMO | Admitting: Cardiovascular Disease

## 2016-04-25 VITALS — BP 106/77 | HR 102 | Ht 69.0 in | Wt 196.0 lb

## 2016-04-25 DIAGNOSIS — I319 Disease of pericardium, unspecified: Secondary | ICD-10-CM

## 2016-04-25 DIAGNOSIS — I3139 Other pericardial effusion (noninflammatory): Secondary | ICD-10-CM

## 2016-04-25 DIAGNOSIS — J90 Pleural effusion, not elsewhere classified: Secondary | ICD-10-CM

## 2016-04-25 DIAGNOSIS — I481 Persistent atrial fibrillation: Secondary | ICD-10-CM | POA: Diagnosis not present

## 2016-04-25 DIAGNOSIS — I10 Essential (primary) hypertension: Secondary | ICD-10-CM

## 2016-04-25 DIAGNOSIS — R1010 Upper abdominal pain, unspecified: Secondary | ICD-10-CM

## 2016-04-25 DIAGNOSIS — I313 Pericardial effusion (noninflammatory): Secondary | ICD-10-CM

## 2016-04-25 DIAGNOSIS — K219 Gastro-esophageal reflux disease without esophagitis: Secondary | ICD-10-CM

## 2016-04-25 DIAGNOSIS — R160 Hepatomegaly, not elsewhere classified: Secondary | ICD-10-CM

## 2016-04-25 DIAGNOSIS — I350 Nonrheumatic aortic (valve) stenosis: Secondary | ICD-10-CM

## 2016-04-25 DIAGNOSIS — K829 Disease of gallbladder, unspecified: Secondary | ICD-10-CM

## 2016-04-25 DIAGNOSIS — E8770 Fluid overload, unspecified: Secondary | ICD-10-CM

## 2016-04-25 DIAGNOSIS — I4819 Other persistent atrial fibrillation: Secondary | ICD-10-CM

## 2016-04-25 DIAGNOSIS — K8020 Calculus of gallbladder without cholecystitis without obstruction: Secondary | ICD-10-CM

## 2016-04-25 DIAGNOSIS — R609 Edema, unspecified: Secondary | ICD-10-CM

## 2016-04-25 MED ORDER — POTASSIUM CHLORIDE CRYS ER 20 MEQ PO TBCR: EXTENDED_RELEASE_TABLET | ORAL | Status: DC

## 2016-04-25 MED ORDER — METOLAZONE 2.5 MG PO TABS: ORAL_TABLET | ORAL | Status: DC

## 2016-04-25 NOTE — Patient Instructions (Addendum)
Your physician has recommended you make the following change in your medication:   1.) STOP taking the ibuprofen.  2.) start new prescription for metolazone ( fluid booster) every other day.  Then every 3rd day after.  3.) take potassium 20 meq on the days you take the metolazone.  A chest x-ray takes a picture of the organs and structures inside the chest, including the heart, lungs, and blood vessels. This test can show several things, including, whether the heart is enlarges; whether fluid is building up in the lungs; and whether pacemaker / defibrillator leads are still in place.  You have been referred to Dr Kinnie Scales for GI REFERRAL  Your physician recommends that you schedule a follow-up appointment in: 4 weeks

## 2016-04-26 ENCOUNTER — Other Ambulatory Visit: Payer: Self-pay

## 2016-04-26 NOTE — Patient Outreach (Signed)
[facility]) Care Management   04/26/2016  Lucas Munoz [birth year] [id number]  Lucas Munoz is an 65 y.o. male Arrived for home visit. Patient met me at on the porch and states that the visit needs to be quick because he has to go pay bills. Subjective: Patient reports that he likes his new cardiologist Dr. Claiborne Billings. States that he feels like he is on the right track to getting his medical problems addressed. States that he feels much better. States that he is able to be more active and his breathing is improved. Patient reports that he saw cardiologist yesterday and started a metolazone pill today.  Patient reports that he is waiting for an appointment with GI doctor for evaluation of gall bladder issues.   Patient reports decreased swelling and recent weight loss of about 30 pounds in the last month.  Reports that he is following his low salt diet. Reports that he is weighing daily but admits that he has missed a few days. States that he knows how to weight and what to do if he has weight gain.  Denies any new problems or concerns.    Objective:  Awake and alert. Ambulating without difficulty today. Filed Vitals:   04/26/16 1218  BP: 102/58  Pulse: 99  Resp: 18  Weight: 189 lb (85.73 kg)  SpO2: 98%   Review of Systems  Constitutional: Positive for weight loss.       Intentional weight loss of excess fluid  HENT: Negative.   Eyes: Negative.   Respiratory: Negative.        Denies shortness of breath  Cardiovascular: Positive for leg swelling.       States that he still has swelling but much less than his normal for the last 2 years  Gastrointestinal: Positive for nausea and abdominal pain.       Reports metallic taste in his mouth  Genitourinary: Positive for frequency.  Musculoskeletal: Negative.   Skin: Negative.   Neurological: Negative.   Endo/Heme/Allergies: Negative.   Psychiatric/Behavioral: Negative.     Physical Exam  Constitutional: He is oriented to person,  place, and time. He appears well-developed and well-nourished.  Cardiovascular: Normal rate.   Respiratory: Effort normal and breath sounds normal.  Lungs clear  GI: Soft. Bowel sounds are normal.  Musculoskeletal: Normal range of motion. He exhibits edema.  2 plus edema to lower legs.   Neurological: He is alert and oriented to person, place, and time.  Skin: Skin is warm and dry.  Feet not examined as patient is in a hurry during home visit. Patient has his socks and shoes on. Denies any problems with his feet.  Psychiatric: He has a normal mood and affect. His behavior is normal. Judgment and thought content normal.    Encounter Medications:   Outpatient Encounter Prescriptions as of 04/26/2016  Medication Sig  . apixaban (ELIQUIS) 5 MG TABS tablet Take 1 tablet (5 mg total) by mouth 2 (two) times daily.  . carvedilol (COREG) 12.5 MG tablet Take 2 tablets (25 mg total) by mouth 2 (two) times daily with a meal.  . famotidine (PEPCID) 10 MG tablet Take 10 mg by mouth daily. Reported on 03/27/2016  . furosemide (LASIX) 80 MG tablet Take 1 tablet in the AM and 1/2 tablet in the evening  . metolazone (ZAROXOLYN) 2.5 MG tablet Take 1 tablet 30 minutes prior to your furosemide dose  . potassium chloride SA (K-DUR,KLOR-CON) 20 MEQ tablet Take 1 tablet on the days that  you take the metolazone   No facility-administered encounter medications on file as of 04/26/2016.    Functional Status:   In your present state of health, do you have any difficulty performing the following activities: 03/27/2016 03/23/2016  Hearing? N N  Vision? Y N  Difficulty concentrating or making decisions? N N  Walking or climbing stairs? Y Y  Dressing or bathing? N N  Doing errands, shopping? Tempie Donning  Preparing Food and eating ? N N  Using the Toilet? N N  In the past six months, have you accidently leaked urine? N N  Do you have problems with loss of bowel control? N N  Managing your Medications? N N  Managing your  Finances? N N  Housekeeping or managing your Housekeeping? N N    Fall/Depression Screening:    PHQ 2/9 Scores 03/27/2016 03/17/2016  PHQ - 2 Score 2 2  PHQ- 9 Score 3 3    Assessment:   (1) Weight loss about 30 pounds in 1 month. Stated that he started metolazone today. Reviewed importance of daily weights. Breathing better, feels better overall. Eating and sleeping well. (2) pending GI follow up. Has occasional nausea. (3) reviewed medications. Provided med box to patient for medication planning.     Plan: (1) Reviewed heart failure zones. Confirmed patient knows how to call cardiologist about weight gain. Reviewed importance of daily weights and low salt diet. (2) reviewed low fat diet and foods to avoid. Encouraged patient to see GI specialist as soon as he can get an appointment per cardiology. (3) provided med box. Offered to assist patient with filling medication planner and patient declined. States he can do this himself.    Patient feels like he does not need any other home visits at this time. He is very happy with his progress and feels like he has the knowledge about how to manage his heart failure. Patient requested that I call him for follow up in 3 weeks. Will plan to make this call to patient and then if no problems transfer to health coach.  Patient is much improved and I congratulated patient on a great job since last home visit. Will send this note to primary MD for an update. Reminded patient to call me if needed.  THN CM Care Plan Problem One        Most Recent Value   Care Plan Problem One  Knowledge deficit about daily management of heart failure   Role Documenting the Problem One  Care Management [place] for Problem One  Active   THN Long Term Goal (31-90 days)  Patient will verbalize no readmissions in the next 90 days.   THN Long Term Goal Start Date  03/27/16   Interventions for Problem One Long Term Goal  Discussed importance of patient calling  MD for concerns and or weight gain. Reviewed importance of daily weights. reviewed importance of following low salt diet.Reminded patient to call me if he needs assistance. telephone follow up planned for 3 weeks.   THN CM Short Term Goal #1 (0-30 days)  Patient will weigh and record daily weights for the next 30 days.   THN CM Short Term Goal #1 Start Date  03/27/16   [facility] CM Short Term Goal #1 Met Date  04/26/16 Barrie Folk met]   Interventions for Short Term Goal #1  reinforced need for daily weights   THN CM Short Term Goal #2 (0-30 days)  Patient will follow low salt  diet for the next 30 days.   THN CM Short Term Goal #2 Start Date  03/27/16   [facility] CM Short Term Goal #2 Met Date  04/26/16 Barrie Folk met]   Interventions for Short Term Goal #2  encouraged patient to follow low salt diet.   THN CM Short Term Goal #3 (0-30 days)  Patient will call Md for weight gain in the next 30 days.   THN CM Short Term Goal #3 Start Date  03/27/16   [facility] CM Short Term Goal #3 Met Date  04/19/16 Barrie Folk met]   Interventions for Short Tern Goal #3  Reviewed reasons to call md. Provided [facility] calendar for recording weights and reasons to call md.      Tomasa Rand, RN, BSN, CEN [place] Coordinator [phone]

## 2016-04-28 ENCOUNTER — Encounter (HOSPITAL_COMMUNITY): Payer: Commercial Managed Care - HMO

## 2016-04-28 ENCOUNTER — Ambulatory Visit (INDEPENDENT_AMBULATORY_CARE_PROVIDER_SITE_OTHER): Payer: Commercial Managed Care - HMO | Admitting: Internal Medicine

## 2016-04-28 ENCOUNTER — Other Ambulatory Visit: Payer: Self-pay

## 2016-04-28 ENCOUNTER — Other Ambulatory Visit: Payer: Self-pay | Admitting: *Deleted

## 2016-04-28 ENCOUNTER — Other Ambulatory Visit: Payer: Self-pay | Admitting: Cardiovascular Disease

## 2016-04-28 ENCOUNTER — Encounter (HOSPITAL_COMMUNITY): Payer: Self-pay | Admitting: *Deleted

## 2016-04-28 ENCOUNTER — Ambulatory Visit (HOSPITAL_COMMUNITY): Payer: Commercial Managed Care - HMO | Attending: Internal Medicine

## 2016-04-28 VITALS — BP 102/62 | HR 92 | Ht 69.0 in | Wt 185.8 lb

## 2016-04-28 DIAGNOSIS — Z8249 Family history of ischemic heart disease and other diseases of the circulatory system: Secondary | ICD-10-CM | POA: Insufficient documentation

## 2016-04-28 DIAGNOSIS — I5023 Acute on chronic systolic (congestive) heart failure: Secondary | ICD-10-CM | POA: Diagnosis not present

## 2016-04-28 DIAGNOSIS — R931 Abnormal findings on diagnostic imaging of heart and coronary circulation: Secondary | ICD-10-CM

## 2016-04-28 DIAGNOSIS — I509 Heart failure, unspecified: Secondary | ICD-10-CM | POA: Diagnosis not present

## 2016-04-28 DIAGNOSIS — I350 Nonrheumatic aortic (valve) stenosis: Secondary | ICD-10-CM

## 2016-04-28 DIAGNOSIS — I110 Hypertensive heart disease with heart failure: Secondary | ICD-10-CM | POA: Insufficient documentation

## 2016-04-28 DIAGNOSIS — I313 Pericardial effusion (noninflammatory): Secondary | ICD-10-CM | POA: Insufficient documentation

## 2016-04-28 DIAGNOSIS — I77810 Thoracic aortic ectasia: Secondary | ICD-10-CM | POA: Insufficient documentation

## 2016-04-28 DIAGNOSIS — I071 Rheumatic tricuspid insufficiency: Secondary | ICD-10-CM | POA: Diagnosis not present

## 2016-04-28 DIAGNOSIS — I3139 Other pericardial effusion (noninflammatory): Secondary | ICD-10-CM | POA: Insufficient documentation

## 2016-04-28 DIAGNOSIS — Z87891 Personal history of nicotine dependence: Secondary | ICD-10-CM | POA: Insufficient documentation

## 2016-04-28 DIAGNOSIS — I7100 Dissection of unspecified site of aorta: Secondary | ICD-10-CM

## 2016-04-28 DIAGNOSIS — I319 Disease of pericardium, unspecified: Secondary | ICD-10-CM | POA: Diagnosis not present

## 2016-04-28 DIAGNOSIS — I340 Nonrheumatic mitral (valve) insufficiency: Secondary | ICD-10-CM | POA: Diagnosis not present

## 2016-04-28 DIAGNOSIS — I7409 Other arterial embolism and thrombosis of abdominal aorta: Secondary | ICD-10-CM

## 2016-04-28 DIAGNOSIS — R29898 Other symptoms and signs involving the musculoskeletal system: Secondary | ICD-10-CM | POA: Insufficient documentation

## 2016-04-28 DIAGNOSIS — I779 Disorder of arteries and arterioles, unspecified: Secondary | ICD-10-CM

## 2016-04-28 NOTE — Progress Notes (Signed)
Electrophysiology Office Note   Date:  04/28/2016   ID:  Lucas Munoz, DOB 10/08/1951, MRN [id number]  PCP:  Wilmer Floor., MD  Cardiologist:  Dr Tresa Endo Primary Electrophysiologist: Hillis Range, MD    Chief Complaint  Patient presents with  . abnormal echo     History of Present Illness: Lucas Munoz is a 65 y.o. male who presents today as an urgent add on as DOD.  The patient is being evaluated by Dr Tresa Endo for aortic stenosis.  His workup has included an echo.  He presents today for the echo.  He has severe AS, modest pericardial effusion without tamponade, enlarged aortic root, and concerns for possible dissection. On exam, the patient denies any symptoms related to a possible dissection.  He states "I feel better than I have in years".  Denies chest pain, back pain, or any other discomfort.  Today, he denies symptoms of palpitations,  shortness of breath at rest, orthopnea, PND, lower extremity edema, claudication, dizziness, presyncope, syncope, bleeding, or neurologic sequela. The patient is tolerating medications without difficulties and is otherwise without complaint today.    Past Medical History  Diagnosis Date  . Cataract     right side per patient  . Heart murmur   . Hypertension   . Fluid retention   . CHF (congestive heart failure) (HCC)   . GERD (gastroesophageal reflux disease)   . Atrial fibrillation [facility])    Past Surgical History  Procedure Laterality Date  . Back surgery      at age 89 years old  . Goiter removed      patient states at age 78  . Tonsillectomy      patient states at age 68     Current Outpatient Prescriptions  Medication Sig Dispense Refill  . apixaban (ELIQUIS) 5 MG TABS tablet Take 1 tablet (5 mg total) by mouth 2 (two) times daily. 60 tablet 6  . carvedilol (COREG) 12.5 MG tablet Take 2 tablets (25 mg total) by mouth 2 (two) times daily with a meal. 60 tablet 6  . famotidine (PEPCID) 10 MG tablet Take 10 mg by mouth daily.  Reported on 03/27/2016    . furosemide (LASIX) 80 MG tablet Take 1 tablet in the AM and 1/2 tablet in the evening 45 tablet 6  . metolazone (ZAROXOLYN) 2.5 MG tablet Take 1 tablet 30 minutes prior to your furosemide dose 30 tablet 6  . potassium chloride SA (K-DUR,KLOR-CON) 20 MEQ tablet Take 1 tablet on the days that you take the metolazone 30 tablet 6   No current facility-administered medications for this visit.    Allergies:   Review of patient's allergies indicates no known allergies.   Social History:  The patient  reports that he quit smoking about 16 years ago. His smoking use included Cigarettes. He has a 10 pack-year smoking history. He does not have any smokeless tobacco history on file. He reports that he does not drink alcohol or use illicit drugs.   Family History:  The patient's  family history includes Alcohol abuse in his brother, maternal grandfather, and paternal grandfather; Cancer in his brother, maternal grandmother, and mother; Heart disease in his father.    ROS:  Please see the history of present illness.   All other systems are reviewed and negative.    PHYSICAL EXAM: VS:  BP 102/62 mmHg  Pulse 92  Ht 5\' 9"  (1.753 m)  Wt 185 lb 12.8 oz (84.278 kg)  BMI 27.43 kg/m2 ,  BMI Body mass index is 27.43 kg/(m^2). GEN: Well nourished, well developed, in no acute distress HEENT: normal Neck: no JVD, carotid bruits, or masses Cardiac: RRR; 2/6 SEM LUSB which is late peaking, symmetric pulses in upper extremities Respiratory:  clear to auscultation bilaterally, normal work of breathing GI: soft, nontender, nondistended, + BS MS: no deformity or atrophy Skin: warm and dry  Neuro:  Strength and sensation are intact Psych: euthymic mood, full affect   Recent Labs: 04/05/2016: ALT 13; Brain Natriuretic Peptide 1711.3*; BUN 23; Creat 1.35*; Hemoglobin 14.0; Magnesium 2.2; Platelets 161; Potassium 4.0; Sodium 142; TSH 1.37    Lipid Panel  No results found for: CHOL,  TRIG, HDL, CHOLHDL, VLDL, LDLCALC, LDLDIRECT   Wt Readings from Last 3 Encounters:  04/28/16 185 lb 12.8 oz (84.278 kg)  04/26/16 189 lb (85.73 kg)  04/25/16 196 lb (88.905 kg)    ASSESSMENT AND PLAN:  1.  Concerns for aortic dissection The patient is completely asymptomatic however his echo is high risk.  I have spoken personally with Dr Rennis Golden and reviewed the echo images at bedside with the patient.  Though the finding could be a reverberation artifact, I cannot exclude dissection.  This concern was discussed with the patient.  I have advised urgent CT scan of the chest today to evaluate for dissection in the office.  If abnormal, he would be sent immediately to the ED. With my discussion, the patient became very agitated, stating that he feels "fine" and that my concerns are unwarranted.  He states that he has had "this abnormal heart issue" for years.  He is very clear that he will not stay in the office today for CT scan.  He states that he needs to go home and that he will return next week.  I have informed him that he is at risk of sudden death/ catastrophic event if he were to indeed have a dissection.  He states " I am ready if my time comes". He is therefore leaving against my advise today but states that he will return next week for CT. I will have my staff go ahead and order his CT and try to discuss with him further to get him to stay for the study today for a more appropriate triage of his situation.  2. Aortic stenosis Appears severe Will need close follow-up with Dr Tresa Endo  3. Acute systolic dysfunction with pericardial effusion Concerning given #1 and #2 above Will need close follow-up with Dr Tresa Endo  He is scheduled to follow-up closely with Dr Tresa Endo    Signed, Hillis Range, MD  04/28/2016 5:15 PM     [facility] [address] [place] [address] [phone] (office) [phone] (fax)

## 2016-04-28 NOTE — Progress Notes (Signed)
Patient ID: Lucas Munoz, male   DOB: Apr 03, 1951, 65 y.o.   MRN: [id number]  2D Echocardiogram was performed revealing Severe, Low Flow Aortic Stenosis (pressumed to be known prior as patient claims he is here as a second opinion from [facility] for valve replacement surgery).  The study also shows pericardial effusion, moderate to severe Tricuspid Regurgitation, and a possible Ascending Aortic Dissection.  Dr. Johney Frame (DOD) was notified, who asked for the echo reader to assess.  Dr. Rennis Golden was contacted, who read the study and cannot rule out Dissection.  Dr. Johney Frame was asked to order a STAT CT, and the patient was stable (although agitated) when transferred to his care at 1:00 pm.    Farrel Conners, RDCS

## 2016-04-28 NOTE — Patient Instructions (Addendum)
Medication Instructions:  Your physician recommends that you continue on your current medications as directed. Please refer to the Current Medication list given to you today.  Labwork: None ordered  Testing/Procedures: Chest CT scanning, (CAT scanning), is a noninvasive, special x-ray that produces cross-sectional images of the body using x-rays and a computer. CT scans help physicians diagnose and treat medical conditions. For some CT exams, a contrast material is used to enhance visibility in the area of the body being studied. CT scans provide greater clarity and reveal more details than regular x-ray exams.  CT was going to work patient in, but the patient refused to wait.  He said he could not wait and would schedule for next week.    Follow-Up:

## 2016-04-29 ENCOUNTER — Encounter: Payer: Self-pay | Admitting: Cardiovascular Disease

## 2016-04-29 DIAGNOSIS — K8020 Calculus of gallbladder without cholecystitis without obstruction: Secondary | ICD-10-CM | POA: Insufficient documentation

## 2016-04-29 NOTE — Progress Notes (Signed)
Patient ID: Yael Angerer, male   DOB: 28-Aug-1951, 65 y.o.   MRN: [id number]     Primary MD: Dr. Jenean Lindau  PATIENT PROFILE: Ragan Reale is a 65 y.o. male  who established cardiology care with me on 04/06/2016.  Remotely, the patient had been seen in [place] by Dr. Ballard Russell.  When I initially saw him, his chief complaint is progressive lower extremety edema, abdominal pain, and irregular heart rhythm.  He presents for follow-up evaluation.   HPI:  Ah Bott moved to [place] from [place] within the past year.  He admits to long-standing history of hypertension.  He has had issues with lower extremity edema and in the past had taken Lasix.  Several years ago his edema was very significant to the point where he was having significant scrotal edema.  He also was found to have increasing episodes of shortness of breath.  Previously was found to have an aortic murmur and was felt to have mild aortic stenosis.  He had lost confidence in his cardiologist in [place].  He was recently hospitalized at [facility].  During that evaluation, he was in atrial fibrillation and had stage II hypertension.  Most recently he had been on carvedilol 12.5 mg twice a day, Pepcid 10 mg for GERD, and furosemide 40 mg twice a day.  He may have been given a prescription for eliquis but he  never started on anticoagulation.  Since hospital discharge, he continued to note shortness of breath and significant tense lower extremity edema..  His pulses are regular.  He denies any episodes of chest pain.  He had experienced intermittent abdominal pain.  When I initially saw him, his ECG confirmed atrial fibrillation with ventricular rate in the 90s and he was not on anticoagulation therapy.  I titrated his carvedilol from 12.5 g twice a day to 18,750,000 g twice a day for 1 week and then further to 25 mg twice a day.  I started him on eloquence 5 mg twice a day for anticoagulation in light of his atrial fibrillation.  He had  a significant cardiac murmur compatible with aortic valve disease.  On exam there as a suggestion of an enlarged liver and he was having recurrent episodes of abdominal pain.  I scheduled him for an echo Doppler study, lower extremity arterial and venous Dopplers, as well as an abdominal CT.  His abdominal CT was done on 04/11/2016 and revealed evidence for a large pericardial effusion with moderate to large right pleural effusion with passive atelectasis.  There was evidence for hepatic cirrhosis.  There was a very large gallstone, 4.1 x3.4 cm and other tiny stones in the gallbladder fundus.  There was gallbladder wall thickening.  There was bilateral nonobstructive nephrolithiasis.  There are scattered small probably reactive retroperitoneal lymph nodes.  There was scarring in the left kidney.  There was evidence for lumbar spondylosis, scoliosis and degenerative disc disease causing multilevel impingement.  There was evidence for coronary artery calcification and dense calcification of his aortic valve.  His echo Doppler study as well as his lower extremity arterial and venous duplex studies have not yet been done.  When I saw him, I increased his Lasix to 80 mg in the morning and 40 mg in the late afternoon.  He has noted significant benefit with softening of his previous 4+ tense lower extremity edema.  He is breathing better.  He denies PND, orthopnea.  He presents for follow-up evaluation.  Past Medical History  Diagnosis Date  .  Cataract     right side per patient  . Heart murmur   . Hypertension   . Fluid retention   . CHF (congestive heart failure) ([place])   . GERD (gastroesophageal reflux disease)   . Atrial fibrillation [facility])     Past Surgical History  Procedure Laterality Date  . Back surgery      at age 69 years old  . Goiter removed      patient states at age 41  . Tonsillectomy      patient states at age 61    No Known Allergies  Current Outpatient Prescriptions  Medication  Sig Dispense Refill  . apixaban (ELIQUIS) 5 MG TABS tablet Take 1 tablet (5 mg total) by mouth 2 (two) times daily. 60 tablet 6  . carvedilol (COREG) 12.5 MG tablet Take 2 tablets (25 mg total) by mouth 2 (two) times daily with a meal. 60 tablet 6  . famotidine (PEPCID) 10 MG tablet Take 10 mg by mouth daily. Reported on 03/27/2016    . furosemide (LASIX) 80 MG tablet Take 1 tablet in the AM and 1/2 tablet in the evening 45 tablet 6  . metolazone (ZAROXOLYN) 2.5 MG tablet Take 1 tablet 30 minutes prior to your furosemide dose 30 tablet 6  . potassium chloride SA (K-DUR,KLOR-CON) 20 MEQ tablet Take 1 tablet on the days that you take the metolazone 30 tablet 6   No current facility-administered medications for this visit.    Social History   Social History  . Marital Status: Unknown    Spouse Name: N/A  . Number of Children: N/A  . Years of Education: N/A   Occupational History  . Not on file.   Social History Main Topics  . Smoking status: Former Smoker -- 1.00 packs/day for 10 years    Types: Cigarettes    Quit date: 04/05/2000  . Smokeless tobacco: Not on file  . Alcohol Use: No  . Drug Use: No  . Sexual Activity: Not on file   Other Topics Concern  . Not on file   Social History Narrative   Additional social history is notable in that he was born in [place].  He lived in [place] for 20 years.  2 half years ago.  He moved to [place] since his daughter is in [place].  He is divorced for 18 years.  He has 2 children and 3 grandchildren.  He lives by himself.  Previously had worked in the produce department at [employer]  He completed 2 years of college.  There is no tobacco use.  Recently, he has not been able to exercise.  Family History  Problem Relation Age of Onset  . Cancer Mother   . Heart disease Father   . Alcohol abuse Brother   . Cancer Brother   . Cancer Maternal Grandmother   . Alcohol abuse Maternal Grandfather   . Alcohol abuse Paternal Grandfather     Additional family history is notable that his mother died at age 84 and had brain cancer.  His father died at 61 with cancer.  2 brothers are deceased, one at age 65 1 at 16 in both had cancer.  He has a sister who is 21 and alive.  ROS General: Negative; No fevers, chills, or night sweats HEENT: Negative; No changes in vision or hearing, sinus congestion, difficulty swallowing Pulmonary: Negative; No cough, wheezing, shortness of breath, hemoptysis Cardiovascular:  See HPI;  Positive for severe lower extremity edema and in the  past significant scrotal edema. GI: Positive for abdominal pain; GERD GU: Negative; No dysuria, hematuria, or difficulty voiding Musculoskeletal: Negative; no myalgias, joint pain, or weakness Hematologic/Oncologic: Negative; no easy bruising, bleeding Endocrine: Negative; no heat/cold intolerance; no diabetes Neuro: Negative; no changes in balance, headaches Skin: Negative; No rashes or skin lesions Psychiatric: Negative; No behavioral problems, depression Sleep: Negative; No daytime sleepiness, hypersomnolence, bruxism, restless legs, hypnogagnic hallucinations Other comprehensive 14 point system review is negative   Physical Exam BP 106/77 mmHg  Pulse 102  Ht _0  (1.753 m)  Wt 196 lb (88.905 kg)  BMI 28.93 kg/m2  Wt Readings from Last 3 Encounters:  04/28/16 185 lb 12.8 oz (84.278 kg)  04/26/16 189 lb (85.73 kg)  04/25/16 196 lb (88.905 kg)   Of note, his weight on 03/23/2016 was 220 pounds, on 03/27/2016 was 2 and 14 pounds, and on 04/05/2016 was 207 pounds.  General: Alert, oriented, no distress.  Skin: normal turgor, no rashes, warm and dry HEENT: Normocephalic, atraumatic. Pupils equal round and reactive to light; sclera anicteric; extraocular muscles intact; Fundi No hemorrhages or exudates.  Disks flat.  Right eye cataract. Nose without nasal septal hypertrophy Mouth/Parynx benign; Mallinpatti scale 2 Neck: JVD 7- cm, no carotid bruits;  normal carotid upstroke Lungs: Decreased breath sounds in the right lower lung field consistent with his documented pleural effusion. Chest wall: without tenderness to palpitation Heart: Irregularly irregular rhythm in the 90s., s1 s2 normal, 9-6/0 systolic murmur in the aortic area, no diastolic murmur, no rubs, gallops, thrills, or heaves Abdomen: The patient's liver appears large and seems to be at least 3 fingers below the right costal margin, BS+; abdominal aorta nontender and not dilated by palpation. Back: no CVA tenderness Pulses 2+ Musculoskeletal: full range of motion, normal strength, no joint deformities Extremities: 3+ edema, significantly less tense than previously lower extremity edema bilaterally, Homan's sign negative  Neurologic: grossly nonfocal; Cranial nerves grossly wnl Psychologic: Normal mood and affect.e   ECG (independently read by me): Atrial fibrillation at 96 bpm.  Nonspecific inferolateral ST-T changes.  04/05/2016 ECG (independently read by me): Atrial fibrillation at 96 bpm.  One PVC versus apparent complex.  QTc interval 480 ms.  LABS:  BMP Latest Ref Rng 04/05/2016  Glucose 65 - 99 mg/dL 109(H)  BUN 7 - 25 mg/dL 23  Creatinine 0.70 - 1.25 mg/dL 1.35(H)  Sodium 135 - 146 mmol/L 142  Potassium 3.5 - 5.3 mmol/L 4.0  Chloride 98 - 110 mmol/L 99  CO2 20 - 31 mmol/L 32(H)  Calcium 8.6 - 10.3 mg/dL 9.4    Hepatic Function Latest Ref Rng 04/05/2016  Total Protein 6.1 - 8.1 g/dL 7.3  Albumin 3.6 - 5.1 g/dL 3.8  AST 10 - 35 U/L 22  ALT 9 - 46 U/L 13  Alk Phosphatase 40 - 115 U/L 129(H)  Total Bilirubin 0.2 - 1.2 mg/dL 4.0(H)    CBC Latest Ref Rng 04/05/2016  WBC 3.8 - 10.8 K/uL 6.5  Hemoglobin 13.2 - 17.1 g/dL 14.0  Hematocrit 38.5 - 50.0 % 43.7  Platelets 140 - 400 K/uL 161   Lab Results  Component Value Date   MCV 92.2 04/05/2016   Lab Results  Component Value Date   TSH 1.37 04/05/2016   No results found for: HGBA1C   BNP    Component  Value Date/Time   BNP 1711.3* 04/05/2016 1303    ProBNP No results found for: PROBNP   Lipid Panel  No results found for: CHOL,  TRIG, HDL, CHOLHDL, VLDL, LDLCALC, LDLDIRECT  RADIOLOGY: Ct Abdomen W Contrast  04/11/2016  CLINICAL DATA:  Hepatomegaly. Edema. Shortness of breath. Upper abdominal pain. EXAM: CT ABDOMEN WITH CONTRAST TECHNIQUE: Multidetector CT imaging of the abdomen was performed using the standard protocol following bolus administration of intravenous contrast. CONTRAST:  150m ISOVUE-300 IOPAMIDOL (ISOVUE-300) INJECTION 61% COMPARISON:  None. FINDINGS: Lower chest: Large pericardial effusion. Moderate enlargement of the cardiopericardial silhouette. Coronary artery atherosclerotic calcification. Dense calcification of the aortic valve. Moderate to large right pleural effusion. Nonspecific for transudative versus exudative. No right pleural effusion. Passive atelectasis in the right lower lobe and right middle lobe. Hepatobiliary: Nodular liver contour favor cirrhosis. There is some architectural distortion of the hepatic parenchyma. I do not see a discrete hepatic mass. The is a 4.1 by 3.4 cm laminated gallstone in the gallbladder. The gallbladder around the stone is contracted. There may be a few other tiny stones in the gallbladder fundus. Gallbladder wall thickening noted. There is a small amount of pericholecystic stranding in the soft tissues. No biliary dilatation. Pancreas: Unremarkable Spleen: Unremarkable Adrenals/Urinary Tract: Scattered small hypodense lesions throughout both kidneys, probably cysts although many of these are technically nonspecific. On image 19/7 there is triangular indistinct hypodensity in the right mid kidney measuring 1.2 by 1.2 cm which may be a volume average cyst or a small renal mass, this may may merit follow up. Scarring of the left kidney upper pole. Scarring of the left mid kidney laterally. Nonobstructive 8 by 5 by 5 mm calculus in the left  collecting system. Additional nonobstructive calculi include a 3 mm right kidney upper pole calculus, a 2 mm left mid kidney calculus, and a 6 mm right kidney lower pole calculus. Stomach/Bowel: Unremarkable Vascular/Lymphatic: Aortoiliac atherosclerotic vascular disease. Atherosclerotic calcification of the splenic artery. Numerous small retroperitoneal lymph nodes are present, index node 0.9 cm in short axis on image 38/2. Other: Unremarkable Musculoskeletal: Lumbar spondylosis and degenerative disc disease, causing impingement at L2-3, L3-4, L4-5, and L5-S1. There is grade 1 retrolisthesis at L1- 2 and L2-3 along with grade 1 anterolisthesis at L4-5, and loss of disc height at L5-S1. Dextroconvex lumbar scoliosis. Bridging spurring of both sacroiliac joints. IMPRESSION: 1. Large pericardial effusion with moderate to large right pleural effusion with passive atelectasis. 2. Hepatic cirrhosis. 3. Very large gallstone, 4.1 cm in long axis, somewhat distending the gallbladder. Mild gallbladder wall thickening and pericholecystic stranding, acute cholecystitis not excluded. 4. Bilateral nonobstructive nephrolithiasis. 5. Scattered small retroperitoneal lymph nodes are increased in number, and are probably reactive. 6. Scarring in the left kidney. 7. Lumbar spondylosis, scoliosis, and degenerative disc disease causing multilevel impingement. 8. Coronary artery atherosclerotic calcification. Dense calcification of the aortic valve. Electronically Signed   By: WVan ClinesM.D.   On: 04/11/2016 16:59     ASSESSMENT AND PLAN: Mr. AAbron Neddois a 65year old male who admits to a long-standing history of hypertension and difficulty with significant lower extremity edema.  When I initially saw him 3 weeks ago his ECG  confirms atrial fibrillation with ventricular rate in the 90s and he was not on anticoagulation therapy.  I am further titrated his carvedilol and started him on eliquis 5 mg twice a day for  anticoagulation of his atrial fibrillation.  He had a cardiac murmur compatible with his aortic valve disease.  He has not yet had his echo Doppler study.  He has lost significant weight with diuresis and his previous 4+ very tense lower extremity edema is now  much softer and 3+.  I reviewed his CT scan in detail with him, which shows significant abnormalities as noted above.  He was felt to have both a large pericardial effusion as well as moderate to large right pleural effusion.  On exam today, I did not hear any friction rub.  He is breathing better.  He has a large gallstone and laboratory revealed a bilirubin of 4, with normal transaminases.  Serum creatinine was 1.35.  I have recommended he discontinue ibuprofen.  With his persistent significant edema  I am adding metolazone 2.5 mg to take every other day for 3 doses, 30 minutes prior to his morning Lasix.  On the days he takes metolazone he will take an extra 20 mEq of potassium supplementation.  After 3 doses of every other day.  He will change this to every third day.  I am scheduling him a PA and lateral chest x-ray.  I am also recommending a GI evaluation.  He will be undergoing his lower extremity arterial and venous Doppler studies as well as his 2-D echo Doppler evaluation.  He continues to be in atrial fibrillation.  He denies bleeding on eliquis.  I will see him in 3-4 weeks for reevaluation.  Time spent: 30 minutes Troy Sine, MD, [facility] 04/29/2016 6:57 PM

## 2016-05-01 ENCOUNTER — Other Ambulatory Visit: Payer: Self-pay | Admitting: *Deleted

## 2016-05-01 ENCOUNTER — Ambulatory Visit (HOSPITAL_COMMUNITY)
Admission: RE | Admit: 2016-05-01 | Discharge: 2016-05-01 | Disposition: A | Discharge status: Home / Self Care | Payer: Commercial Managed Care - HMO | Source: Ambulatory Visit | Attending: Cardiovascular Disease | Admitting: Cardiovascular Disease

## 2016-05-01 ENCOUNTER — Encounter: Payer: Self-pay | Admitting: Internal Medicine

## 2016-05-01 ENCOUNTER — Ambulatory Visit (INDEPENDENT_AMBULATORY_CARE_PROVIDER_SITE_OTHER)
Admission: RE | Admit: 2016-05-01 | Discharge: 2016-05-01 | Disposition: A | Discharge status: Home / Self Care | Payer: Commercial Managed Care - HMO | Source: Ambulatory Visit | Attending: Internal Medicine | Admitting: Internal Medicine

## 2016-05-01 DIAGNOSIS — K219 Gastro-esophageal reflux disease without esophagitis: Secondary | ICD-10-CM | POA: Diagnosis not present

## 2016-05-01 DIAGNOSIS — I8393 Asymptomatic varicose veins of bilateral lower extremities: Secondary | ICD-10-CM | POA: Insufficient documentation

## 2016-05-01 DIAGNOSIS — R601 Generalized edema: Secondary | ICD-10-CM | POA: Diagnosis not present

## 2016-05-01 DIAGNOSIS — R931 Abnormal findings on diagnostic imaging of heart and coronary circulation: Secondary | ICD-10-CM

## 2016-05-01 DIAGNOSIS — I509 Heart failure, unspecified: Secondary | ICD-10-CM | POA: Insufficient documentation

## 2016-05-01 DIAGNOSIS — I110 Hypertensive heart disease with heart failure: Secondary | ICD-10-CM | POA: Insufficient documentation

## 2016-05-01 DIAGNOSIS — R609 Edema, unspecified: Secondary | ICD-10-CM

## 2016-05-01 MED ORDER — IOPAMIDOL (ISOVUE-370) INJECTION 76%
100.0000 mL | Freq: Once | INTRAVENOUS | Status: AC | PRN
  Administered 2016-05-01: 100 mL via INTRAVENOUS

## 2016-05-02 ENCOUNTER — Telehealth: Payer: Self-pay | Admitting: Cardiovascular Disease

## 2016-05-02 ENCOUNTER — Telehealth: Payer: Self-pay | Admitting: *Deleted

## 2016-05-02 ENCOUNTER — Ambulatory Visit (HOSPITAL_COMMUNITY)
Admission: RE | Admit: 2016-05-02 | Discharge: 2016-05-02 | Disposition: A | Discharge status: Home / Self Care | Payer: Commercial Managed Care - HMO | Source: Ambulatory Visit | Attending: Cardiology | Admitting: Cardiology

## 2016-05-02 DIAGNOSIS — I509 Heart failure, unspecified: Secondary | ICD-10-CM | POA: Diagnosis not present

## 2016-05-02 DIAGNOSIS — I779 Disorder of arteries and arterioles, unspecified: Secondary | ICD-10-CM

## 2016-05-02 DIAGNOSIS — I7409 Other arterial embolism and thrombosis of abdominal aorta: Secondary | ICD-10-CM | POA: Diagnosis not present

## 2016-05-02 DIAGNOSIS — K219 Gastro-esophageal reflux disease without esophagitis: Secondary | ICD-10-CM | POA: Diagnosis not present

## 2016-05-02 DIAGNOSIS — I110 Hypertensive heart disease with heart failure: Secondary | ICD-10-CM | POA: Insufficient documentation

## 2016-05-02 DIAGNOSIS — R938 Abnormal findings on diagnostic imaging of other specified body structures: Secondary | ICD-10-CM | POA: Diagnosis not present

## 2016-05-02 NOTE — Telephone Encounter (Signed)
Spoke with patient regarding appointment scheduled with [facility] (Dr. Ewing Schlein) on Wednesday 05/17/16 @ 4:00 pm--arrive at 3:45pm for check in.  Patient given address--[address] and phone # [phone].  Patient voiced his understanding.

## 2016-05-02 NOTE — Telephone Encounter (Addendum)
Returned call. Pt weight today 181 lbs - wants to know if he should continue current dose of lasix and metolazone. No SOB or excess abdominal fluid. Still puffy around ankles.  Advised continue on current dose until further recommendations from Dr. Tresa Endo.  We discussed recommendations from lower extremity bilateral US (DVT r/o) and recommendation to trial wear compression socks for venous insufficiency.  Pt also waiting to hear final results of CT scan from Monday - ordered by Dr. Johney Frame after AS noted on echo. Also had further questions for Dr. Tresa Endo He does not currently have a scheduled f/u - how soon does he need to be seen and is it OK to double book?

## 2016-05-02 NOTE — Telephone Encounter (Signed)
Spoke to patient.I received walk in form this afternoon that you were requesting a call back with Chest CT results done 05/01/16.Stated he already spoke to our triage nurse Harrold Donath earlier.Advised I will send message to Dr.Allred since he ordered Chest CT.Advised someone should call him back tomorrow with results.

## 2016-05-02 NOTE — Telephone Encounter (Signed)
Spoke with patient regarding appointment with GI physician---records were faxed to Dr. Madelyn Brunner. Kinnie Scales not in insurance network.  Patient then asked how much more weight does Dr. Tresa Endo want him to lose?

## 2016-05-03 NOTE — Telephone Encounter (Signed)
Dr Johney Frame was DOD and it was ordered for ? Dissection.  Patient was aware is showed dissection would be sent to the hospital and if not would follow up with Dr Tresa Endo.  There is no dissection.  Dr Johney Frame reviewed and advised to forward to Dr Tresa Endo for recommendations and follow up.  Spoke with patient and he is aware I will forward to Dr Tresa Endo and his nurse

## 2016-05-05 ENCOUNTER — Other Ambulatory Visit: Payer: Commercial Managed Care - HMO

## 2016-05-07 NOTE — Telephone Encounter (Signed)
No aortic dissection on CT;  But with severe As and pericardial effusion add pt onto my office schedule in July

## 2016-05-10 ENCOUNTER — Other Ambulatory Visit: Payer: Self-pay | Admitting: *Deleted

## 2016-05-10 ENCOUNTER — Telehealth: Payer: Self-pay | Admitting: *Deleted

## 2016-05-10 DIAGNOSIS — R9389 Abnormal findings on diagnostic imaging of other specified body structures: Secondary | ICD-10-CM

## 2016-05-10 NOTE — Telephone Encounter (Signed)
-----   Message from Lennette Bihari, MD sent at 05/07/2016  9:10 PM EDT ----- No evidence for aortic dissection.  Aortic valve calcification, consistent with his severe aortic valve stenosis.  Right pleural effusion and pericardial effusion.  He has a cirrhotic liver.  Recommend thyroid ultrasound to further evaluate bilateral thyroid hypodensities.

## 2016-05-10 NOTE — Telephone Encounter (Signed)
Called and informed patient of CT results and recommendations. Patient voiced understanding requested if possible to have thyroid ultrasound scheduled on 05/16/16. He has another doctors appointment in [place] that day at 3:45.

## 2016-05-17 ENCOUNTER — Ambulatory Visit
Admission: RE | Admit: 2016-05-17 | Discharge: 2016-05-17 | Disposition: A | Discharge status: Home / Self Care | Payer: Commercial Managed Care - HMO | Source: Ambulatory Visit | Attending: Cardiovascular Disease | Admitting: Cardiovascular Disease

## 2016-05-17 DIAGNOSIS — R140 Abdominal distension (gaseous): Secondary | ICD-10-CM | POA: Diagnosis not present

## 2016-05-17 DIAGNOSIS — R740 Nonspecific elevation of levels of transaminase and lactic acid dehydrogenase [LDH]: Secondary | ICD-10-CM | POA: Diagnosis not present

## 2016-05-17 DIAGNOSIS — Z1210 Encounter for screening for malignant neoplasm of intestinal tract, unspecified: Secondary | ICD-10-CM | POA: Diagnosis not present

## 2016-05-17 DIAGNOSIS — E042 Nontoxic multinodular goiter: Secondary | ICD-10-CM | POA: Diagnosis not present

## 2016-05-17 DIAGNOSIS — I499 Cardiac arrhythmia, unspecified: Secondary | ICD-10-CM | POA: Diagnosis not present

## 2016-05-17 DIAGNOSIS — R9389 Abnormal findings on diagnostic imaging of other specified body structures: Secondary | ICD-10-CM

## 2016-05-17 DIAGNOSIS — K210 Gastro-esophageal reflux disease with esophagitis: Secondary | ICD-10-CM | POA: Diagnosis not present

## 2016-05-17 DIAGNOSIS — R932 Abnormal findings on diagnostic imaging of liver and biliary tract: Secondary | ICD-10-CM | POA: Diagnosis not present

## 2016-05-18 ENCOUNTER — Other Ambulatory Visit: Payer: Self-pay

## 2016-05-18 NOTE — Patient Outreach (Signed)
Telephone follow up: Placed call to patient who reports that he is doing well. States that he feels the  best he has felt in a long time. States that he is happy with his progress and feels like he is on the right track. States that he had a follow up with GI yesterday and had a thyroid study completed yesterday as well. States that his nausea has decrease and he is eating better. Reports that dairy products makes his stomach feel better.    CHF: reports that he is weighing daily. States that he loses a pound and then he gains a pound. States that he is following his low salt diet. Reports that he has started wearing compression hose and feels like his swelling is decreased. States decreased shortness of breath.  Reports that he continues to take his medications as prescribed. States that he will be returning to work soon to work only part time. States that he is getting his energy back.  Reviewed with patient his plan of care and his goals. Offered health coach and patient has declined. States that he would call [facility] if he needs someone but he feels like he is doing much better. Again patient very appreciative of case management services.   PLAN: Will close case as patient has denied any current goals or needs.  Goals that were made has been achieved.            Will send update and case closure to MD.            Will send case closure letter to patient.            Will notify [facility] care management assistant of case closure.   THN CM Care Plan Problem One        Most Recent Value   Care Plan Problem One  Knowledge deficit about daily management of heart failure   Role Documenting the Problem One  Care Management [place] for Problem One  Active   THN Long Term Goal (31-90 days)  Patient will verbalize no readmissions in the next 90 days.   THN Long Term Goal Start Date  03/27/16   THN Long Term Goal Met Date  05/18/16   Interventions for Problem One Long Term Goal  Discussed  importance of patient calling MD for concerns and or weight gain. Reviewed importance of daily weights. reviewed importance of following low salt diet.Reminded patient to call me if he needs assistance. telephone follow up planned for 3 weeks.   THN CM Short Term Goal #1 (0-30 days)  Patient will weigh and record daily weights for the next 30 days.   THN CM Short Term Goal #1 Start Date  03/27/16   [facility] CM Short Term Goal #1 Met Date  04/26/16 Barrie Folk met]   Interventions for Short Term Goal #1  reinforced need for daily weights   THN CM Short Term Goal #2 (0-30 days)  Patient will follow low salt diet for the next 30 days.   THN CM Short Term Goal #2 Start Date  03/27/16   [facility] CM Short Term Goal #2 Met Date  04/26/16 Barrie Folk met]   Interventions for Short Term Goal #2  encouraged patient to follow low salt diet.   THN CM Short Term Goal #3 (0-30 days)  Patient will call Md for weight gain in the next 30 days.   THN CM Short Term Goal #3 Start Date  03/27/16   [facility]  CM Short Term Goal #3 Met Date  04/19/16 Barrie Folk met]   Interventions for Short Tern Goal #3  Reviewed reasons to call md. Provided [facility] calendar for recording weights and reasons to call md.      Tomasa Rand, RN, BSN, CEN [place] Coordinator [phone]

## 2016-06-05 ENCOUNTER — Telehealth: Payer: Self-pay | Admitting: Cardiovascular Disease

## 2016-06-13 ENCOUNTER — Telehealth: Payer: Self-pay | Admitting: Cardiovascular Disease

## 2016-06-13 ENCOUNTER — Other Ambulatory Visit: Payer: Self-pay | Admitting: Cardiovascular Disease

## 2016-06-13 NOTE — Telephone Encounter (Signed)
New message       FYI Pt is calling to let Dr Tresa Endo know he is feeling great and is back at work.

## 2016-06-15 NOTE — Telephone Encounter (Signed)
Closed encounter °

## 2016-06-22 ENCOUNTER — Other Ambulatory Visit: Payer: Self-pay | Admitting: *Deleted

## 2016-06-22 ENCOUNTER — Telehealth: Payer: Self-pay | Admitting: *Deleted

## 2016-06-22 DIAGNOSIS — E041 Nontoxic single thyroid nodule: Secondary | ICD-10-CM

## 2016-06-22 NOTE — Telephone Encounter (Signed)
Patient notified of thyroid ultrasound results and recommendations. He states that he is going out of town to see his new granddaughter and will call to let me know when he returns. He will see the Endocrinologist then. I stated to him that I didn't want this to fall through the cracks so I will do the referral and he can tell their office when he can come when they call to make the appointment. Patient agreed with the plan. Referral made to the Endocrinologist.

## 2016-08-15 ENCOUNTER — Encounter: Payer: Self-pay | Admitting: Nurse Practitioner

## 2016-08-15 ENCOUNTER — Ambulatory Visit (INDEPENDENT_AMBULATORY_CARE_PROVIDER_SITE_OTHER): Payer: Commercial Managed Care - HMO | Admitting: Nurse Practitioner

## 2016-08-15 ENCOUNTER — Telehealth: Payer: Self-pay

## 2016-08-15 VITALS — BP 110/74 | HR 61 | Ht 68.5 in | Wt 206.0 lb

## 2016-08-15 DIAGNOSIS — E042 Nontoxic multinodular goiter: Secondary | ICD-10-CM | POA: Diagnosis not present

## 2016-08-15 DIAGNOSIS — I5023 Acute on chronic systolic (congestive) heart failure: Secondary | ICD-10-CM | POA: Diagnosis not present

## 2016-08-15 DIAGNOSIS — I481 Persistent atrial fibrillation: Secondary | ICD-10-CM

## 2016-08-15 DIAGNOSIS — K8020 Calculus of gallbladder without cholecystitis without obstruction: Secondary | ICD-10-CM

## 2016-08-15 DIAGNOSIS — I429 Cardiomyopathy, unspecified: Secondary | ICD-10-CM

## 2016-08-15 DIAGNOSIS — I350 Nonrheumatic aortic (valve) stenosis: Secondary | ICD-10-CM

## 2016-08-15 DIAGNOSIS — I4819 Other persistent atrial fibrillation: Secondary | ICD-10-CM

## 2016-08-15 LAB — CBC
HCT: 44.5 % (ref 38.5–50.0)
HEMOGLOBIN: 13.8 g/dL (ref 13.2–17.1)
MCH: 26.3 pg — ABNORMAL LOW (ref 27.0–33.0)
MCHC: 31 g/dL — ABNORMAL LOW (ref 32.0–36.0)
MCV: 84.8 fL (ref 80.0–100.0)
MPV: 12.1 fL (ref 7.5–12.5)
PLATELETS: 206 10*3/uL (ref 140–400)
RBC: 5.25 MIL/uL (ref 4.20–5.80)
RDW: 16.8 % — ABNORMAL HIGH (ref 11.0–15.0)
WBC: 7.3 10*3/uL (ref 3.8–10.8)

## 2016-08-15 LAB — BASIC METABOLIC PANEL
BUN: 23 mg/dL (ref 7–25)
CALCIUM: 9.4 mg/dL (ref 8.6–10.3)
CHLORIDE: 96 mmol/L — AB (ref 98–110)
CO2: 29 mmol/L (ref 20–31)
Creat: 1.6 mg/dL — ABNORMAL HIGH (ref 0.70–1.25)
GLUCOSE: 112 mg/dL — AB (ref 65–99)
POTASSIUM: 3.7 mmol/L (ref 3.5–5.3)
SODIUM: 138 mmol/L (ref 135–146)

## 2016-08-15 MED ORDER — CARVEDILOL 6.25 MG PO TABS: 6.2500 mg | ORAL_TABLET | Freq: Two times a day (BID) | ORAL | 6 refills | Status: DC

## 2016-08-15 MED ORDER — FUROSEMIDE 80 MG PO TABS: ORAL_TABLET | ORAL | 6 refills | Status: DC

## 2016-08-15 NOTE — Progress Notes (Signed)
Office Visit    Patient Name: Lucas Munoz Date of Encounter: 08/15/2016  Primary Care Provider:  Wilmer FloorAMPBELL, STEPHEN D., MD Primary Cardiologist:  Bishop Limbo. Kelly, MD   Chief Complaint    65 y/o ? with a h/o severe AS, LV dysfxn, HTN, noncompliance, thyroid nodules, pericardial effusion, cholelithiasis, and persistent AF on eliquis, who presents to clinic today for f/u related to volume overload and prior testing.  Past Medical History    Past Medical History:  Diagnosis Date  . Cardiomyopathy (HCC)    a. 04/2016 Echo: EF 20-25%.  . Cataract    right side per patient  . Cholelithiasis    a. 03/2016 Abd CT: 4.1 x 3.4 cm gallstone and other tiny stones in the gb fundus-->referred to GI.  Marland Kitchen. Chronic pulmonary embolism (HCC)    a. 04/2016 CTA Chest: Sequelae of chronic pulm thromboembolism in RLL. No acute PE.  Marland Kitchen. Chronic systolic CHF (congestive heart failure) (HCC)    a. 04/2016 Echo: EF 20-25%, mod LVH, diff HK w/ anterior, anteroseptal, and apical AK, sev AS (mean grad 40 mmHg, peak grad 66 mmHg), Asc Ao diameter 45 mm - ? flap (not seen on f/u CTA), mild MR, reduced RV fxn, sev dil RA, mod TR, PASP 50 mmHg, small to mod circumferential pericardial effusion.  Marland Kitchen. GERD (gastroesophageal reflux disease)   . Hepatic cirrhosis (HCC)    a. 03/2016 Abd CT: evidence for hepatic cirrhosis.  . Hypertensive heart disease with heart failure (HCC)   . Lower extremity edema    a. 04/2016 LE U/S: No DVT. Bilat greater saphenous vein incompetence.  . Multiple thyroid nodules    a. 05/2016 Thyroid U/S: Bilat nodules & cysts, largest in left thyroid lobe, 1.4 cm; solid nodule along the medial right thyroid lobe measuring 1.0 cm - rec u/s guided needle bx.  . Pericardial effusion    a. 03/2016 Abd CT: mod to large pericardial effusion; b. 04/2016 Echo: small to mod circumferential pericardial effusion.  . Persistent atrial fibrillation (HCC)    a. On eliquis (CHA2DS2VASc = 3).  . Pleural effusion on right    a.  03/2016 incidental finding on abd CT; b. 04/2016 R pl effusion noted on CTA Chest.  . Severe aortic stenosis    Past Surgical History:  Procedure Laterality Date  . BACK SURGERY     at age 65 years old  . goiter removed     patient states at age 89  . TONSILLECTOMY     patient states at age 65    Allergies  No Known Allergies  History of Present Illness    65 y/o ? with the above complex past medical history.  He had previously been seen by Dr. Roanna Raideravinkar in [place] related to volume overload and dyspnea.  Per pt, he was hospitalized at some point in 2016 with flu like Ss and following evaluation, was told that he needed to have valve surgery.  He did not feel that this was accurate and deferred, wishing to seek a second opinion.  He also was dx w/ Afib during hospitalization @ [facility] earlier this year.  He established care with Dr. Tresa EndoKelly in May of 2017.  He was in AFib @ the time and his coreg was titrated to 18.75 mg BID and eliquis 5mg  bid was added.  He had an aortic murmur and he also c/o abd pain.  His lasix dose was escalated and he was sent for a CT of the abd revealing  evidence of hepatic cirrhosis, a large pericardial effusion, a mod to large R pleural effusion, and a 4.1 x 3.4 cm gallstone, and  bilat nonobstructive nephrolithiasis.  He was referred to GI but has yet to see them, and isn't scheduled to be seen until later this month.    By the time he was seen back in June, he had not yet had his lower ext u/s and echo.  These were subsequently performed and the u/s was neg for DVT.  The echo showed LV dysfxn with an EF of 20-25%, diff HK w/ anterior, anteroseptal, and apical AK, along with severe AS with mean and peak gradients of 40 and 66 mmHg respectively.  There was also concern for possible dissection flap in the ascending Ao, in the setting of a dilated ascending Ao of 45 mm.  B/c of this, he was seen by Dr. Johney Frame on the same day as the echo and referred for CTA of the chest.   This was negative for Ao dissection but did show chronic pulmonary thromboembolism in the RLL PA segment, along with a right pleural effusion, pericardial effusion, and bilateral thyroid hypodensities with rec for f/u thyroid u/s.  This was performed in July, revealing multiple thyroid nodules and cysts with recommendation for u/s guided needle bx.  Pt was contacted and endocrine referral was made, though he never followed up on this.  He was also advised to f/u with Dr. Tresa Endo re: abnl echo however, appt was never made as pt went on vacation and says that he was feeling well.  About 2 wks ago, he started noticing worsening lower ext edema, DOE, and early satiety.  He has not been taking his lasix as Rx (80 in the AM, 40 in the PM) b/c he says he only takes a dose if his volume is up.  Though he recognizes that his volume is currently up, he has only taken about 4 doses of lasix in the past week.  He has also continued to have significant abd discomfort, similar to what he's been having over the past several mos.  He thinks that it is worse after he takes coreg, and so he has not been taking his coreg over the past few days to weeks.  He has not been having chest pain and also denies palpitations, pnd, orthopnea, n, v, dizziness, or syncope.  He says he weighs himself @ home but isn't aware that his wt is up 20 lbs since his last visit here.  He says that despite prior reluctance to pursue further eval for his AS and CHF, he is now ready.  Home Medications    Prior to Admission medications   Medication Sig Start Date End Date Taking? Authorizing Provider  apixaban (ELIQUIS) 5 MG TABS tablet Take 1 tablet (5 mg total) by mouth 2 (two) times daily. 04/05/16  Yes Lennette Bihari, MD  carvedilol (COREG) 6.25 MG tablet Take 1 tablet (6.25 mg total) by mouth 2 (two) times daily with a meal. 08/15/16  Yes Ok Anis, NP  famotidine (PEPCID) 10 MG tablet Take 10 mg by mouth daily. Reported on 03/27/2016   Yes  [name] Orvan Falconer, MD  furosemide (LASIX) 80 MG tablet Take 1 tablet in the AM and 1/2 tablet in the evening 08/15/16  Yes Ok Anis, NP    Review of Systems    As above, he has been having progressive DOE, lower ext edema, inc abd girth, and early satiety.  He denies  chest pain, palpitations, pnd, orthopnea, n, v, dizziness, or syncope. All other systems reviewed and are otherwise negative except as noted above.  Physical Exam    VS:  BP 110/74 (BP Location: Left Arm, Patient Position: Sitting, Cuff Size: Normal)   Pulse 61   Ht 5' 8.5" (1.74 m)   Wt 206 lb (93.4 kg)   BMI 30.87 kg/m  , BMI Body mass index is 30.87 kg/m. GEN: Well nourished, well developed, in no acute distress.  HEENT: normal.  Neck: Supple, JVP ~ 14 cm, no carotid bruits, or masses. Cardiac: IR, IR, 2/6 SEM RUSB - heard throughout.  No rubs, or gallops. No clubbing, cyanosis.  3+ bilat LE edema to thighs.  Radials/DP/PT 1+ and equal bilaterally.  Respiratory:  Respirations regular and unlabored, clear to auscultation bilaterally. GI: firm, protuberant, edematous, non-tender, BS + x 4. MS: no deformity or atrophy. Skin: warm and dry, no rash. Neuro:  Strength and sensation are intact. Psych: Normal affect.  Accessory Clinical Findings    ECG - Afib, 148, PVC, poor R progression, mild inflatST dep. No acute st/t changes.  Assessment & Plan    1.  Acute on chronic systolic CHF/Cardiomyopathy:  Pt presents with a 2 wk h/o progressive DOE, LEE, increased abd girth w/ flank edema, wt gain (20 lbs since last visit), and early satiety.  Following his last visit, he underwent echo, which showed LV dysfxn (EF 20-25%, diff HK, ant/antsept/apical AK) and severe Ao stenosis.  He never f/u after echo and reports that he has not been taking his meds as Rx.  We discussed the importance of daily weights, sodium restriction, medication compliance, and symptom reporting and he verbalizes understanding.  I will check a  bmet today.  We had a long discussion about future evaluation of LV dysfxn and severe aortic stenosis.  He understands that this would require diagnostic right and left heart cath likely to be followed by CT surgery consultation.  He is not interested in admission for IV diuresis today.  He would like to defer cath until he is feeling better and so I have asked him to resume lasix 80 in the am and 40 in the pm. He has not been taking coreg over the past few days b/c he feels like it hurts his stomach.  He would be willing to take a lower dose, and so I have asked him to resume coreg at a lower dose of 6.25 BID.  I will see him back in one week to reassess his volume status and arrange for outpt cath.  2.  Severe Aortic Stenosis:  This appears to date back to 2016, as he says he was previously told by his former cardiologist in [place] that he would require a valve replacement.  Echo in June reconfirms severe AS as outlined above.  I will see him back next week following resumption of diuretics and plan for R & L heart cath thereafter.  Resuming  blocker as above.  3.  Persistent Afib:  Rate up today, though he hasn't been taking his coreg.  I have asked him to resume it @ a lower dose of 6.25 mg BID as his pressure is only 110.  He remains on eliquis and says that he has been compliant with it.  This will need to be held pre-cath.  4.  Hypertensive Heart Dzs:  BP stable.  5.  Thyroid Nodules & Cysts:  Gershon Cull found incidentally on CTA chest in June with dedicated thyroid  u/s in July confirming.  He has prev been referred to endocrine however he did not follow through on this.  We will put in for another referral as radiology did rec u/s guided needle bx.  Of note ,TSH was nl in May.  6.  Cholelithiasis:  CT of abdomen back in May showed a very large gallstone.  He was referred to GI but has yet to f/u.  He says that he is scheduled to f/u with GI on 10/20 - this must be outside of the [facility] system as no  appts are listed.  I suspect this is causing his abd pain with eating.  7.  Noncompliance:  This will continue to be an issue going forward.  8.  Disposition:  I will see him back in one week to reassess his response to diuresis.  BMET today.  Hopefully we can schedule R & L heart cath for next week if labs stable and he is able to lie flat.   Nicolasa Ducking, NP 08/15/2016, 3:54 PM

## 2016-08-15 NOTE — Telephone Encounter (Signed)
Patient walked in office.Stated he has been having sob with exertion.Stated he saw Dr.Kelly 04/25/16 and never received results of 04/28/16 echo.Advised Dr.Kelly out of office today.Appointment scheduled with Ward Givens PA this afternoon at 3:00 pm.

## 2016-08-15 NOTE — Patient Instructions (Signed)
Medication Instructions:   START FUROSEMIDE 80 MG IN THE MORNING AND 40 MG IN THE EVENING= 1 TABLET IN THE AM AND 1/2 TABLET IN THE PM  DECREASE CARVEDILOL TO 6.25 MG ONE TABLET TWICE DAILY  Labwork:  Your physician recommends that you HAVE LAB WORK TODAY  Follow-Up:  Your physician recommends that you schedule a follow-up appointment in: ONE WEEK= 08-23-16   REFERRAL TO ENDOCRINOLOGY FOR THYROID NODULES

## 2016-08-16 ENCOUNTER — Telehealth: Payer: Self-pay | Admitting: *Deleted

## 2016-08-16 DIAGNOSIS — I10 Essential (primary) hypertension: Secondary | ICD-10-CM

## 2016-08-16 MED ORDER — POTASSIUM CHLORIDE CRYS ER 20 MEQ PO TBCR: 20.0000 meq | EXTENDED_RELEASE_TABLET | Freq: Every day | ORAL | 3 refills | Status: DC

## 2016-08-16 NOTE — Telephone Encounter (Signed)
-----   Message from Ok Anis, NP sent at 08/16/2016  6:10 AM EDT ----- Mr. Lenning creat is elevated above prior baseline, likely as a result of his heart failure.  This will hopefully improve with diuresis.  In order to keep close tabs on it, he should have a repeat bmet on Friday morning.  He should also take 20 of KCl daily.  I believe he has potassium @ home, though we'll need to check with him again.

## 2016-08-18 ENCOUNTER — Telehealth: Payer: Self-pay | Admitting: Nurse Practitioner

## 2016-08-18 ENCOUNTER — Other Ambulatory Visit: Payer: Self-pay | Admitting: Nurse Practitioner

## 2016-08-18 ENCOUNTER — Other Ambulatory Visit: Payer: Self-pay | Admitting: *Deleted

## 2016-08-18 DIAGNOSIS — I10 Essential (primary) hypertension: Secondary | ICD-10-CM

## 2016-08-18 DIAGNOSIS — Z79899 Other long term (current) drug therapy: Secondary | ICD-10-CM | POA: Diagnosis not present

## 2016-08-18 LAB — BASIC METABOLIC PANEL
BUN: 25 mg/dL (ref 7–25)
CALCIUM: 8.6 mg/dL (ref 8.6–10.3)
CO2: 34 mmol/L — ABNORMAL HIGH (ref 20–31)
CREATININE: 1.79 mg/dL — AB (ref 0.70–1.25)
Chloride: 95 mmol/L — ABNORMAL LOW (ref 98–110)
Glucose, Bld: 106 mg/dL — ABNORMAL HIGH (ref 65–99)
Potassium: 3.5 mmol/L (ref 3.5–5.3)
Sodium: 141 mmol/L (ref 135–146)

## 2016-08-18 NOTE — Telephone Encounter (Signed)
pt states he "came in for wt check" weight is 197.6

## 2016-08-21 ENCOUNTER — Telehealth: Payer: Self-pay | Admitting: *Deleted

## 2016-08-21 DIAGNOSIS — K8020 Calculus of gallbladder without cholecystitis without obstruction: Secondary | ICD-10-CM

## 2016-08-21 MED ORDER — FUROSEMIDE 80 MG PO TABS: ORAL_TABLET | ORAL | 6 refills | Status: DC

## 2016-08-21 NOTE — Telephone Encounter (Signed)
-----   Message from Ok Anis, NP sent at 08/21/2016  8:24 AM EDT ----- K is low @ 3.5. Creat is up slightly. Let's have him drop lasix dose to 40 bid. Cont KCl @ 20 daily.  I'll see him Wed as scheduled.

## 2016-08-21 NOTE — Telephone Encounter (Signed)
Pt aware of his lab results. Pt did advise that he was only taking 10 meq of K (on his own) Pt was advised that he needed to take a whole pill 20 meq . Pt was advise the Lasix to 40 bid Pt verbalized understanding and confirmed appt 08/23/16. Changes made in EPIC.

## 2016-08-23 ENCOUNTER — Encounter: Payer: Self-pay | Admitting: Nurse Practitioner

## 2016-08-23 ENCOUNTER — Ambulatory Visit (INDEPENDENT_AMBULATORY_CARE_PROVIDER_SITE_OTHER): Payer: Commercial Managed Care - HMO | Admitting: Nurse Practitioner

## 2016-08-23 VITALS — BP 125/86 | HR 156 | Ht 68.0 in | Wt 203.6 lb

## 2016-08-23 DIAGNOSIS — I110 Hypertensive heart disease with heart failure: Secondary | ICD-10-CM

## 2016-08-23 DIAGNOSIS — I5023 Acute on chronic systolic (congestive) heart failure: Secondary | ICD-10-CM | POA: Diagnosis not present

## 2016-08-23 DIAGNOSIS — I481 Persistent atrial fibrillation: Secondary | ICD-10-CM

## 2016-08-23 DIAGNOSIS — I429 Cardiomyopathy, unspecified: Secondary | ICD-10-CM | POA: Diagnosis not present

## 2016-08-23 DIAGNOSIS — I4819 Other persistent atrial fibrillation: Secondary | ICD-10-CM

## 2016-08-23 DIAGNOSIS — I350 Nonrheumatic aortic (valve) stenosis: Secondary | ICD-10-CM

## 2016-08-23 MED ORDER — SODIUM CHLORIDE 0.9 % IV SOLN: 250.0000 mL | INTRAVENOUS | Status: DC | PRN

## 2016-08-23 MED ORDER — SODIUM CHLORIDE 0.9% FLUSH
3.0000 mL | Freq: Two times a day (BID) | INTRAVENOUS | Status: DC
  Administered 2016-08-24 – 2016-08-28 (×7): 3 mL via INTRAVENOUS

## 2016-08-23 MED ORDER — SODIUM CHLORIDE 0.9% FLUSH: 3.0000 mL | INTRAVENOUS | Status: DC | PRN

## 2016-08-23 NOTE — Patient Instructions (Signed)
Your physician recommends that you schedule a follow-up appointment in: Pt is being admitted tomorrow.

## 2016-08-23 NOTE — Progress Notes (Addendum)
Cardiology Clinic Note   Patient Name: Lucas Munoz Date of Encounter: 08/23/2016  Primary Care Provider:  Wilmer Floor., MD Primary Cardiologist:  Bishop Limbo, MD   Patient Profile    65 y/o ? with a h/o severe AS, LV dysfxn, HTN, noncompliance, thyroid nodules, pericardial effusion, cholelithiasis, and persistent AF on eliquis, who presents to clinic today for f/u related to volume overload.  Past Medical History    Past Medical History:  Diagnosis Date  . Cardiomyopathy (HCC)    a. 04/2016 Echo: EF 20-25%.  . Cataract    right side per patient  . Cholelithiasis    a. 03/2016 Abd CT: 4.1 x 3.4 cm gallstone and other tiny stones in the gb fundus-->referred to GI.  Marland Kitchen Chronic pulmonary embolism (HCC)    a. 04/2016 CTA Chest: Sequelae of chronic pulm thromboembolism in RLL. No acute PE.  Marland Kitchen Chronic systolic CHF (congestive heart failure) (HCC)    a. 04/2016 Echo: EF 20-25%, mod LVH, diff HK w/ anterior, anteroseptal, and apical AK, sev AS (mean grad 40 mmHg, peak grad 66 mmHg), Asc Ao diameter 45 mm - ? flap (not seen on f/u CTA), mild MR, reduced RV fxn, sev dil RA, mod TR, PASP 50 mmHg, small to mod circumferential pericardial effusion.  Marland Kitchen GERD (gastroesophageal reflux disease)   . Hepatic cirrhosis (HCC)    a. 03/2016 Abd CT: evidence for hepatic cirrhosis.  . Hypertensive heart disease with heart failure (HCC)   . Lower extremity edema    a. 04/2016 LE U/S: No DVT. Bilat greater saphenous vein incompetence.  . Multiple thyroid nodules    a. 05/2016 Thyroid U/S: Bilat nodules & cysts, largest in left thyroid lobe, 1.4 cm; solid nodule along the medial right thyroid lobe measuring 1.0 cm - rec u/s guided needle bx.  . Pericardial effusion    a. 03/2016 Abd CT: mod to large pericardial effusion; b. 04/2016 Echo: small to mod circumferential pericardial effusion.  . Persistent atrial fibrillation (HCC)    a. On eliquis (CHA2DS2VASc = 3).  . Pleural effusion on right    a. 03/2016  incidental finding on abd CT; b. 04/2016 R pl effusion noted on CTA Chest.  . Severe aortic stenosis    Past Surgical History:  Procedure Laterality Date  . BACK SURGERY     at age 66 years old  . goiter removed     patient states at age 62  . TONSILLECTOMY     patient states at age 25    Allergies  No Known Allergies  History of Present Illness    65 y/o ? with the above complex past medical history.  He had previously been seen by Dr. Roanna Raider in [place] related to volume overload and dyspnea.  Per pt, he was hospitalized at some point in 2016 with flu like Ss and following evaluation, was told that he needed to have valve surgery.  He did not feel that this was accurate and deferred, wishing to seek a second opinion.  He also was dx w/ Afib during hospitalization @ [facility] earlier this year.  He established care with Dr. Tresa Endo in May of 2017.  He was in AFib @ the time and his coreg was titrated to 18.75 mg BID and eliquis 5mg  bid was added.  He had an aortic murmur and he also c/o abd pain.  His lasix dose was escalated and he was sent for a CT of the abd revealing evidence of hepatic cirrhosis,  a large pericardial effusion, a mod to large R pleural effusion, and a 4.1 x 3.4 cm gallstone, and  bilat nonobstructive nephrolithiasis.  He was referred to GI but has yet to see them, and isn't scheduled to be seen until later this month.    By the time he was seen back in June, he had not yet had his lower ext u/s and echo.  These were subsequently performed and the u/s was neg for DVT.  The echo showed LV dysfxn with an EF of 20-25%, diff HK w/ anterior, anteroseptal, and apical AK, along with severe AS with mean and peak gradients of 40 and 66 mmHg respectively.  There was also concern for possible dissection flap in the ascending Ao, in the setting of a dilated ascending Ao of 45 mm.  B/c of this, he was seen by Dr. Johney FrameAllred on the same day as the echo and referred for CTA of the chest.  This  was negative for Ao dissection but did show chronic pulmonary thromboembolism in the RLL PA segment, along with a right pleural effusion, pericardial effusion, and bilateral thyroid hypodensities with rec for f/u thyroid u/s.  This was performed in July, revealing multiple thyroid nodules and cysts with recommendation for u/s guided needle bx.  Pt was contacted and endocrine referral was made, though he never followed up on this.  He was also advised to f/u with Dr. Tresa EndoKelly re: abnl echo however, appt was never made as pt went on vacation and says that he was feeling well.  I saw Mr. Areta HaberRench on 08/15/2016 b/c he was experiencing worsening lower ext edema, DOE, and early satiety in the setting of noncompliance with his lasix along with dietary indiscretions.  He also c/o ongoing lower abd discomfort, which he felt was worse after taking coreg.  He said that he was ready to really begin the w/u into his AS and CHF and I offered to schedule him for right and left heart cath.  He balked and instead wished to f/u one more time in the office prior to scheduling.  I advised him to resume lasix @ 80 am and 40 pm and at his request, I lowered his coreg dose to 6.25 mg BID.  He had good response from resumption of lasix with 9 lbs wt loss as of 10/6.  F/u labs however revealed worsening renal fxn (BUN 25, creat 1.79, bicarb 34), and thus I advised via phone note on 10/6 that he drop his lasix back to 40 mg BID.  He was also advised to continue taking KCl 20 meq daily.  Since then, his wt has crept back up  203 lbs today.  He has been dyspneic with activity and has noted return of lower extremity swelling.  He has also continued to have lower abdominal discomfort and intermittent nausea.  He is interested in hospital admission but wishes to defer until tomorrow b/c he will have to arrange for transportation.  Home Medications    Prior to Admission medications   Medication Sig Start Date End Date Taking? Authorizing Provider    apixaban (ELIQUIS) 5 MG TABS tablet Take 1 tablet (5 mg total) by mouth 2 (two) times daily. 04/05/16  Yes Lennette Biharihomas A Kelly, MD  carvedilol (COREG) 6.25 MG tablet Take 1 tablet (6.25 mg total) by mouth 2 (two) times daily with a meal. 08/15/16  Yes Ok Anishristopher R Isidro Monks, NP  famotidine (PEPCID) 10 MG tablet Take 10 mg by mouth daily. Reported on 03/27/2016  Yes [name] Orvan Falconer, MD  furosemide (LASIX) 80 MG tablet Take 1/2 tablet in the AM and 1/2 tablet in the evening 08/21/16  Yes Ok Anis, NP  potassium chloride SA (K-DUR,KLOR-CON) 20 MEQ tablet Take 1 tablet (20 mEq total) by mouth daily. 08/16/16  Yes Ok Anis, NP    Family History    Family History  Problem Relation Age of Onset  . Cancer Mother   . Heart disease Father   . Alcohol abuse Brother   . Cancer Brother   . Cancer Maternal Grandmother   . Alcohol abuse Maternal Grandfather   . Alcohol abuse Paternal Grandfather     Social History    Social History   Social History  . Marital status: Unknown    Spouse name: N/A  . Number of children: N/A  . Years of education: N/A   Occupational History  . Not on file.   Social History Main Topics  . Smoking status: Former Smoker    Packs/day: 1.00    Years: 10.00    Types: Cigarettes    Quit date: 04/05/2000  . Smokeless tobacco: Not on file  . Alcohol use No  . Drug use: No  . Sexual activity: Not on file   Other Topics Concern  . Not on file   Social History Narrative  . No narrative on file     Review of Systems    General:  No chills, fever, night sweats or weight changes.  Cardiovascular:  No chest pain, +++ dyspnea on exertion, +++ edema, no orthopnea, palpitations, paroxysmal nocturnal dyspnea, +++ early satiety. Dermatological: No rash, lesions/masses Respiratory: +++ cough w/ clear sputum, +++ dyspnea Urologic: No hematuria, dysuria Abdominal:   +++ intermittent nausea w/ chronic LLQ abd discomfofrt, no vomiting, diarrhea, bright  red blood per rectum, melena, or hematemesis Neurologic:  No visual changes, wkns, changes in mental status. All other systems reviewed and are otherwise negative except as noted above.  Physical Exam    VS:  BP 125/86   Pulse (!) 96   Ht 5\' 8"  (1.727 m)   Wt 203 lb 9.6 oz (92.4 kg)   BMI 30.96 kg/m  , BMI Body mass index is 30.96 kg/m. GEN: Well nourished, well developed, in no acute distress.  HEENT: normal.  Neck: Supple, JVP ~ 12 cm, no carotid bruits, or masses. Cardiac: IR, IR, 2/6 syst murmur heard throughout, no rubs, or gallops. No clubbing, cyanosis, 3+ bilat LE edema to knees.  Radials/DP/PT 2+ and equal bilaterally.  Respiratory:  Respirations regular and unlabored, clear to auscultation bilaterally. GI: Soft, nontender, nondistended, BS + x 4. MS: no deformity or atrophy. Skin: warm and dry, no rash. Neuro:  Strength and sensation are intact. Psych: Normal affect.  Accessory Clinical Findings    Lab Results  Component Value Date   CREATININE 1.79 (H) 08/18/2016   BUN 25 08/18/2016   NA 141 08/18/2016   K 3.5 08/18/2016   CL 95 (L) 08/18/2016   CO2 34 (H) 08/18/2016     Assessment & Plan   1.  Acute on chronic systolic CHF/Cardiomyopathy:  Pt with known h/o AS with echo in June showing an EF of 20-25% with diff HK, anterior/anteroseptal/apical AK.  When I saw him last week, he was markedly volume overloaded and up roughly 20 lbs from the spring, in the setting of noncompliance with lasix and coreg.  We had a prolonged discussion at that time as to how to proceed to  best evaluate his cardiomyopathy and AS, specifically regarding diagnostic catheterization, likely to be followed by thoracic surgery consultation.  We also discussed admission at that time as I had concerns that oral lasix may be inadequate in providing him the diuresis that he would need.  He preferred outpt management and arranged for f/u today.  Though he did initially respond to lasix 80 mg in the am  and 40 mg in the pm, this resulted in worsening of renal fxn, causing [place] to scale back his dose.  With that he has put some fluid back on (lost 9 lbs then gained 6 of it back).  In that setting, he has had ongoing lower ext swelling and DOE.  He is now interested in admission, though says that he'd have to wait until tomorrow in order to arrange transportation.  He is not in acute distress @ rest and thus I feel that this is a reasonable compromise. I have discussed his case with Dr. Tresa Endo today.  We'll plan to admit to [facility] on 10/12 for diuresis and monitoring of renal fxn.  Hold eliquis beginning on 10/14 with a plan for R and L heart cath on 10/16 provided that renal fxn stable.  Expect TCTS consult following cath.  Continue coreg (he has not been taking as Rx b/c he feels that it upsets his stomach).  No acei/arb/entresto/spiro in setting of CKD with need for diuresis and contrast.  Bidil not ideal in setting of severe aortic stenosis.  2.  Severe Aortic Stenosis:  This appears to date back to at least 2016, as he says he was previously told by his former cardiologist in [place] that he would require a valve replacement.  Echo in June reconfirms severe AS as outlined above. Elective admission 10/12 for diuresis followed by R & L heart cath next week provided that renal fxn is stable.  3.  Persistent Afib:  HR was elevated upon arrival today, however he was quite upset about having to pay a co-pay and once he calmed some, his HR improved to the 90's/low 100's.  Cont coreg and eliquis.  Eliquis will need to be held beginning 10/14.  4.  Hypertensive Heart Disease:  BP stable.  5.  Thyroid Nodules & Cysts:  Gershon Cull found incidentally on CTA chest in June with dedicated thyroid u/s in July confirming.  He has prev been referred to endocrine for u/s guided needle bx, however he did not follow through on this. I put in for another referral last week, however he has not heard anything from endocrine.  His TSH  was nl in May.  We will need to keep this in mind moving forward as he will likely require another referral following hospital d/c.  6.  Cholelithiasis:  CT of abdomen back in May showed a very large gallstone.  He was referred to GI but has yet to f/u.  He says that he is scheduled to f/u with GI on 10/20. His abd discomfort is more LLQ vs RUQ.  He has had nl BM's and says that appetite has been stable.  I question to what extent passive congestion from CHF as well as finding of cirrhosis (noted 03/2016 CT) might be playing a role in abd Ss.   7.  CKD III:  Worsening of creat with escalation of oral lasix.  Suspect he is not metabolizing it well in the setting of massive volume overload.  Follow creat with IV diuresis.  8.  Noncompliance:  He says that  he has been taking meds as Rx since last week.  9.  Disposition:  Elective admission to [facility] on 10/12 for diuresis and further w/u of sev AS and cardiomyopathy.  Nicolasa Ducking, NP 08/23/2016, 2:34 PM

## 2016-08-24 ENCOUNTER — Inpatient Hospital Stay (HOSPITAL_COMMUNITY)
Admission: AD | Admit: 2016-08-24 | Discharge: 2016-09-16 | DRG: 266 | Disposition: A | Discharge status: Home Health | Payer: Commercial Managed Care - HMO | Source: Ambulatory Visit | Attending: Cardiovascular Disease | Admitting: Cardiovascular Disease

## 2016-08-24 ENCOUNTER — Encounter (HOSPITAL_COMMUNITY): Payer: Self-pay | Admitting: General Practice

## 2016-08-24 DIAGNOSIS — I5043 Acute on chronic combined systolic (congestive) and diastolic (congestive) heart failure: Secondary | ICD-10-CM | POA: Diagnosis present

## 2016-08-24 DIAGNOSIS — J90 Pleural effusion, not elsewhere classified: Secondary | ICD-10-CM

## 2016-08-24 DIAGNOSIS — I5020 Unspecified systolic (congestive) heart failure: Secondary | ICD-10-CM | POA: Diagnosis not present

## 2016-08-24 DIAGNOSIS — J189 Pneumonia, unspecified organism: Secondary | ICD-10-CM | POA: Diagnosis not present

## 2016-08-24 DIAGNOSIS — I130 Hypertensive heart and chronic kidney disease with heart failure and stage 1 through stage 4 chronic kidney disease, or unspecified chronic kidney disease: Secondary | ICD-10-CM | POA: Diagnosis present

## 2016-08-24 DIAGNOSIS — Z9689 Presence of other specified functional implants: Secondary | ICD-10-CM

## 2016-08-24 DIAGNOSIS — K7460 Unspecified cirrhosis of liver: Secondary | ICD-10-CM | POA: Diagnosis present

## 2016-08-24 DIAGNOSIS — Q231 Congenital insufficiency of aortic valve: Secondary | ICD-10-CM

## 2016-08-24 DIAGNOSIS — I420 Dilated cardiomyopathy: Secondary | ICD-10-CM | POA: Diagnosis not present

## 2016-08-24 DIAGNOSIS — R739 Hyperglycemia, unspecified: Secondary | ICD-10-CM | POA: Diagnosis not present

## 2016-08-24 DIAGNOSIS — Z9114 Patient's other noncompliance with medication regimen: Secondary | ICD-10-CM | POA: Diagnosis not present

## 2016-08-24 DIAGNOSIS — K8020 Calculus of gallbladder without cholecystitis without obstruction: Secondary | ICD-10-CM | POA: Diagnosis present

## 2016-08-24 DIAGNOSIS — N189 Chronic kidney disease, unspecified: Secondary | ICD-10-CM | POA: Diagnosis not present

## 2016-08-24 DIAGNOSIS — I700 Atherosclerosis of aorta: Secondary | ICD-10-CM | POA: Diagnosis present

## 2016-08-24 DIAGNOSIS — J9601 Acute respiratory failure with hypoxia: Secondary | ICD-10-CM | POA: Diagnosis not present

## 2016-08-24 DIAGNOSIS — I350 Nonrheumatic aortic (valve) stenosis: Secondary | ICD-10-CM

## 2016-08-24 DIAGNOSIS — I2510 Atherosclerotic heart disease of native coronary artery without angina pectoris: Secondary | ICD-10-CM | POA: Diagnosis present

## 2016-08-24 DIAGNOSIS — J42 Unspecified chronic bronchitis: Secondary | ICD-10-CM | POA: Diagnosis present

## 2016-08-24 DIAGNOSIS — Z23 Encounter for immunization: Secondary | ICD-10-CM

## 2016-08-24 DIAGNOSIS — I481 Persistent atrial fibrillation: Principal | ICD-10-CM | POA: Diagnosis present

## 2016-08-24 DIAGNOSIS — R6521 Severe sepsis with septic shock: Secondary | ICD-10-CM | POA: Diagnosis not present

## 2016-08-24 DIAGNOSIS — R918 Other nonspecific abnormal finding of lung field: Secondary | ICD-10-CM | POA: Diagnosis not present

## 2016-08-24 DIAGNOSIS — R0602 Shortness of breath: Secondary | ICD-10-CM | POA: Diagnosis not present

## 2016-08-24 DIAGNOSIS — R6881 Early satiety: Secondary | ICD-10-CM | POA: Diagnosis not present

## 2016-08-24 DIAGNOSIS — I083 Combined rheumatic disorders of mitral, aortic and tricuspid valves: Secondary | ICD-10-CM | POA: Diagnosis present

## 2016-08-24 DIAGNOSIS — N183 Chronic kidney disease, stage 3 (moderate): Secondary | ICD-10-CM | POA: Diagnosis present

## 2016-08-24 DIAGNOSIS — I509 Heart failure, unspecified: Secondary | ICD-10-CM

## 2016-08-24 DIAGNOSIS — N179 Acute kidney failure, unspecified: Secondary | ICD-10-CM

## 2016-08-24 DIAGNOSIS — A419 Sepsis, unspecified organism: Secondary | ICD-10-CM | POA: Diagnosis not present

## 2016-08-24 DIAGNOSIS — K219 Gastro-esophageal reflux disease without esophagitis: Secondary | ICD-10-CM | POA: Diagnosis not present

## 2016-08-24 DIAGNOSIS — Z515 Encounter for palliative care: Secondary | ICD-10-CM | POA: Diagnosis not present

## 2016-08-24 DIAGNOSIS — Z8249 Family history of ischemic heart disease and other diseases of the circulatory system: Secondary | ICD-10-CM

## 2016-08-24 DIAGNOSIS — Z888 Allergy status to other drugs, medicaments and biological substances status: Secondary | ICD-10-CM

## 2016-08-24 DIAGNOSIS — Z952 Presence of prosthetic heart valve: Secondary | ICD-10-CM

## 2016-08-24 DIAGNOSIS — I5023 Acute on chronic systolic (congestive) heart failure: Secondary | ICD-10-CM | POA: Diagnosis not present

## 2016-08-24 DIAGNOSIS — R570 Cardiogenic shock: Secondary | ICD-10-CM | POA: Diagnosis present

## 2016-08-24 DIAGNOSIS — R7989 Other specified abnormal findings of blood chemistry: Secondary | ICD-10-CM | POA: Diagnosis present

## 2016-08-24 DIAGNOSIS — R0902 Hypoxemia: Secondary | ICD-10-CM | POA: Diagnosis not present

## 2016-08-24 DIAGNOSIS — E872 Acidosis: Secondary | ICD-10-CM | POA: Diagnosis not present

## 2016-08-24 DIAGNOSIS — Z006 Encounter for examination for normal comparison and control in clinical research program: Secondary | ICD-10-CM | POA: Diagnosis not present

## 2016-08-24 DIAGNOSIS — Z4682 Encounter for fitting and adjustment of non-vascular catheter: Secondary | ICD-10-CM

## 2016-08-24 DIAGNOSIS — E042 Nontoxic multinodular goiter: Secondary | ICD-10-CM | POA: Diagnosis present

## 2016-08-24 DIAGNOSIS — E871 Hypo-osmolality and hyponatremia: Secondary | ICD-10-CM | POA: Diagnosis not present

## 2016-08-24 DIAGNOSIS — J942 Hemothorax: Secondary | ICD-10-CM | POA: Diagnosis not present

## 2016-08-24 DIAGNOSIS — K5730 Diverticulosis of large intestine without perforation or abscess without bleeding: Secondary | ICD-10-CM | POA: Diagnosis present

## 2016-08-24 DIAGNOSIS — Y95 Nosocomial condition: Secondary | ICD-10-CM | POA: Diagnosis not present

## 2016-08-24 DIAGNOSIS — R1110 Vomiting, unspecified: Secondary | ICD-10-CM | POA: Diagnosis not present

## 2016-08-24 DIAGNOSIS — I272 Pulmonary hypertension, unspecified: Secondary | ICD-10-CM | POA: Diagnosis present

## 2016-08-24 DIAGNOSIS — J9811 Atelectasis: Secondary | ICD-10-CM | POA: Diagnosis not present

## 2016-08-24 DIAGNOSIS — I313 Pericardial effusion (noninflammatory): Secondary | ICD-10-CM | POA: Diagnosis not present

## 2016-08-24 DIAGNOSIS — I950 Idiopathic hypotension: Secondary | ICD-10-CM | POA: Diagnosis not present

## 2016-08-24 DIAGNOSIS — Z01810 Encounter for preprocedural cardiovascular examination: Secondary | ICD-10-CM | POA: Diagnosis not present

## 2016-08-24 DIAGNOSIS — N170 Acute kidney failure with tubular necrosis: Secondary | ICD-10-CM | POA: Diagnosis not present

## 2016-08-24 DIAGNOSIS — R34 Anuria and oliguria: Secondary | ICD-10-CM | POA: Diagnosis not present

## 2016-08-24 DIAGNOSIS — R896 Abnormal cytological findings in specimens from other organs, systems and tissues: Secondary | ICD-10-CM | POA: Diagnosis not present

## 2016-08-24 DIAGNOSIS — R05 Cough: Secondary | ICD-10-CM | POA: Diagnosis not present

## 2016-08-24 DIAGNOSIS — E876 Hypokalemia: Secondary | ICD-10-CM | POA: Diagnosis not present

## 2016-08-24 DIAGNOSIS — K4090 Unilateral inguinal hernia, without obstruction or gangrene, not specified as recurrent: Secondary | ICD-10-CM | POA: Diagnosis present

## 2016-08-24 DIAGNOSIS — Z954 Presence of other heart-valve replacement: Secondary | ICD-10-CM | POA: Diagnosis not present

## 2016-08-24 DIAGNOSIS — Z87891 Personal history of nicotine dependence: Secondary | ICD-10-CM

## 2016-08-24 DIAGNOSIS — I513 Intracardiac thrombosis, not elsewhere classified: Secondary | ICD-10-CM | POA: Diagnosis not present

## 2016-08-24 DIAGNOSIS — R269 Unspecified abnormalities of gait and mobility: Secondary | ICD-10-CM | POA: Diagnosis not present

## 2016-08-24 DIAGNOSIS — Z7901 Long term (current) use of anticoagulants: Secondary | ICD-10-CM

## 2016-08-24 DIAGNOSIS — J9690 Respiratory failure, unspecified, unspecified whether with hypoxia or hypercapnia: Secondary | ICD-10-CM

## 2016-08-24 DIAGNOSIS — I4891 Unspecified atrial fibrillation: Secondary | ICD-10-CM | POA: Diagnosis not present

## 2016-08-24 HISTORY — DX: Unspecified chronic bronchitis: J42

## 2016-08-24 LAB — CBC WITH DIFFERENTIAL/PLATELET
BASOS ABS: 0.1 10*3/uL (ref 0.0–0.1)
Basophils Relative: 1 %
Eosinophils Absolute: 0 10*3/uL (ref 0.0–0.7)
Eosinophils Relative: 0 %
HEMATOCRIT: 45.3 % (ref 39.0–52.0)
HEMOGLOBIN: 14.1 g/dL (ref 13.0–17.0)
LYMPHS PCT: 13 %
Lymphs Abs: 1.2 10*3/uL (ref 0.7–4.0)
MCH: 26.9 pg (ref 26.0–34.0)
MCHC: 31.1 g/dL (ref 30.0–36.0)
MCV: 86.5 fL (ref 78.0–100.0)
Monocytes Absolute: 0.8 10*3/uL (ref 0.1–1.0)
Monocytes Relative: 9 %
NEUTROS ABS: 7.1 10*3/uL (ref 1.7–7.7)
NEUTROS PCT: 77 %
PLATELETS: 176 10*3/uL (ref 150–400)
RBC: 5.24 MIL/uL (ref 4.22–5.81)
RDW: 18.3 % — ABNORMAL HIGH (ref 11.5–15.5)
WBC: 9.1 10*3/uL (ref 4.0–10.5)

## 2016-08-24 LAB — COMPREHENSIVE METABOLIC PANEL
ALK PHOS: 82 U/L (ref 38–126)
ALT: 146 U/L — AB (ref 17–63)
AST: 204 U/L — AB (ref 15–41)
Albumin: 3.1 g/dL — ABNORMAL LOW (ref 3.5–5.0)
Anion gap: 16 — ABNORMAL HIGH (ref 5–15)
BUN: 43 mg/dL — AB (ref 6–20)
CHLORIDE: 99 mmol/L — AB (ref 101–111)
CO2: 22 mmol/L (ref 22–32)
CREATININE: 3.42 mg/dL — AB (ref 0.61–1.24)
Calcium: 9.2 mg/dL (ref 8.9–10.3)
GFR calc Af Amer: 20 mL/min — ABNORMAL LOW (ref >60)
GFR, EST NON AFRICAN AMERICAN: 17 mL/min — AB (ref >60)
Glucose, Bld: 137 mg/dL — ABNORMAL HIGH (ref 65–99)
Potassium: 4.4 mmol/L (ref 3.5–5.1)
Sodium: 137 mmol/L (ref 135–145)
Total Bilirubin: 6.1 mg/dL — ABNORMAL HIGH (ref 0.3–1.2)
Total Protein: 6.4 g/dL — ABNORMAL LOW (ref 6.5–8.1)

## 2016-08-24 LAB — TSH: TSH: 1.246 u[IU]/mL (ref 0.350–4.500)

## 2016-08-24 MED ORDER — ACETAMINOPHEN 325 MG PO TABS
650.0000 mg | ORAL_TABLET | ORAL | Status: DC | PRN
  Administered 2016-08-25 – 2016-09-16 (×17): 650 mg via ORAL
  Filled 2016-08-24 (×17): qty 2

## 2016-08-24 MED ORDER — METOPROLOL TARTRATE 5 MG/5ML IV SOLN
INTRAVENOUS | Status: AC
  Administered 2016-08-24: 5 mg via INTRAVENOUS
  Filled 2016-08-24: qty 5

## 2016-08-24 MED ORDER — ONDANSETRON HCL 4 MG/2ML IJ SOLN
4.0000 mg | Freq: Four times a day (QID) | INTRAMUSCULAR | Status: DC | PRN
  Administered 2016-08-27 – 2016-09-08 (×4): 4 mg via INTRAVENOUS
  Filled 2016-08-24 (×7): qty 2

## 2016-08-24 MED ORDER — APIXABAN 5 MG PO TABS
5.0000 mg | ORAL_TABLET | Freq: Two times a day (BID) | ORAL | Status: DC
  Administered 2016-08-24 – 2016-08-25 (×3): 5 mg via ORAL
  Filled 2016-08-24 (×3): qty 1

## 2016-08-24 MED ORDER — FAMOTIDINE 20 MG PO TABS
20.0000 mg | ORAL_TABLET | Freq: Every day | ORAL | Status: DC
  Administered 2016-08-24 – 2016-08-25 (×2): 20 mg via ORAL
  Filled 2016-08-24 (×2): qty 1

## 2016-08-24 MED ORDER — METOPROLOL TARTRATE 5 MG/5ML IV SOLN
5.0000 mg | Freq: Once | INTRAVENOUS | Status: AC
  Administered 2016-08-24: 5 mg via INTRAVENOUS

## 2016-08-24 MED ORDER — INFLUENZA VAC SPLIT QUAD 0.5 ML IM SUSY
0.5000 mL | PREFILLED_SYRINGE | INTRAMUSCULAR | Status: AC
  Administered 2016-08-25: 0.5 mL via INTRAMUSCULAR
  Filled 2016-08-24: qty 0.5

## 2016-08-24 MED ORDER — CARVEDILOL 6.25 MG PO TABS
6.2500 mg | ORAL_TABLET | Freq: Two times a day (BID) | ORAL | Status: DC
Start: 2016-08-24 — End: 2016-08-25
  Administered 2016-08-24: 6.25 mg via ORAL
  Filled 2016-08-24 (×2): qty 1

## 2016-08-24 MED ORDER — FAMOTIDINE 20 MG PO TABS: 10.0000 mg | ORAL_TABLET | Freq: Every day | ORAL | Status: DC

## 2016-08-24 MED ORDER — POTASSIUM CHLORIDE CRYS ER 20 MEQ PO TBCR
20.0000 meq | EXTENDED_RELEASE_TABLET | Freq: Every day | ORAL | Status: DC
  Filled 2016-08-24: qty 1

## 2016-08-24 MED ORDER — FUROSEMIDE 10 MG/ML IJ SOLN
80.0000 mg | Freq: Two times a day (BID) | INTRAMUSCULAR | Status: DC
  Filled 2016-08-24: qty 8

## 2016-08-24 NOTE — Progress Notes (Signed)
1800 placed a call To cardiology MD on call. Dr hochrein called back . Spoken to him . Made him aware of low bp as charted . Pt asymptomatic . That coreg and lasix were not given . No further orders given . To continue to monitor pt. Pt aware

## 2016-08-24 NOTE — Progress Notes (Signed)
1530 Seen by Dr Mayford Knife , Made aware of afib 110=120"s To give  Metropolol if BP is .> 110 pbs. VS taken and charted

## 2016-08-24 NOTE — Progress Notes (Signed)
1510 report received from [facility]

## 2016-08-24 NOTE — H&P (Signed)
Cardiology H&P    Patient ID: Lucas Munoz MRN: [id number], DOB/AGE: August 16, 1951   Admit date: 08/24/2016 Date of Consult: 08/24/2016  Primary Physician: Wilmer Floor., MD Primary Cardiologist: Dr. Tresa Endo  Patient Profile  65 y/o ?with a h/o severe AS, LV dysfxn, HTN, noncompliance, thyroid nodules, pericardial effusion, cholelithiasis, and persistent AF on eliquis, who presented to clinic on 08/23/16 with volume overload and was admitted for further work up of AS and CHF.   History of Present Illness  65 y/o ?with the above complex past medical history. He had previously been seen by Dr. Roanna Raider in [place] related to volume overload and dyspnea. Per pt, he was hospitalized at some point in 2016 with flu like Ss and following evaluation, was told that he needed to have valve surgery. He did not feel that this was accurate and deferred, wishing to seek a second opinion. He also was dx w/ Afib during hospitalization @ [facility] earlier this year. He established care with Dr. Tresa Endo in May of 2017. He was in AFib @ the time and his coreg was titrated to 18.75 mg BID and eliquis 5mg  bid was added. He had an aortic murmur and he also c/o abd pain. His lasix dose was escalated and he was sent for a CT of the abd revealing evidence of hepatic cirrhosis, a large pericardial effusion, a mod to large R pleural effusion, and a 4.1 x 3.4 cm gallstone, and bilat nonobstructive nephrolithiasis. He was referred to GI but has yet to see them, and isn't scheduled to be seen until later this month.   By the time he was seen back in June, he had not yet had his lower ext u/s and echo. These were subsequently performed and the u/s was neg for DVT. The echo showed LV dysfxn with an EF of 20-25%, diff HK w/ anterior, anteroseptal, and apical AK, along with severe AS with mean and peak gradients of 40 and 66 mmHg respectively. 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 B/c of this, he was seen by Dr. Johney Frame on the same day as the echo and referred for CTA of the chest. This was negative for Ao dissection but did show chronic pulmonary thromboembolism in the RLL PA segment, along with a right pleural effusion, pericardial effusion, and bilateral thyroid hypodensities with rec for f/u thyroid u/s. This was performed in July, revealing multiple thyroid nodules and cysts with recommendation for u/s guided needle bx. Pt was contacted and endocrine referral was made, though he never followed up on this. He was also advised to f/u with Dr. Tresa Endo re: abnl echo however, appt was never made as pt went on vacation and says that he was feeling well.  Mr. Crescenzo was seen in clinic on 08/15/2016 b/c he was experiencing worsening lower ext edema, DOE, and early satiety in the setting of noncompliance with his lasix along with dietary indiscretions.  He also c/o ongoing lower abd discomfort, which he felt was worse after taking coreg.  He said that he was ready to really begin the w/u into his AS and CHF and our office offered to schedule him for right and left heart cath.  He balked and instead wished to f/u one more time in the office prior to scheduling. He was advised to resume lasix @ 80 am and 40 pm and at his request, his coreg dose was lowered  to 6.25 mg BID.  He had good response from resumption of lasix with 9 lbs wt loss as of 10/6.  F/u labs however revealed worsening renal fxn (BUN 25, creat 1.79, bicarb 34), and thus he was advised via phone note on 10/6 that he drop his lasix back to 40 mg BID.  He was also advised to continue taking KCl 20 meq daily.  Since then, his wt has crept back up  203 lbs on the day of his office visit on 08/23/16.  He has been dyspneic with activity and has noted return of lower extremity swelling.  He has also continued to have lower abdominal discomfort and intermittent nausea.  He was referred  for hospital admission.   Past Medical History   Past Medical History:  Diagnosis Date  . Cardiomyopathy (HCC)    a. 04/2016 Echo: EF 20-25%.  . Cataract    right side per patient  . Cholelithiasis    a. 03/2016 Abd CT: 4.1 x 3.4 cm gallstone and other tiny stones in the gb fundus-->referred to GI.  Marland Kitchen Chronic pulmonary embolism (HCC)    a. 04/2016 CTA Chest: Sequelae of chronic pulm thromboembolism in RLL. No acute PE.  Marland Kitchen Chronic systolic CHF (congestive heart failure) (HCC)    a. 04/2016 Echo: EF 20-25%, mod LVH, diff HK w/ anterior, anteroseptal, and apical AK, sev AS (mean grad 40 mmHg, peak grad 66 mmHg), Asc Ao diameter 45 mm - ? flap (not seen on f/u CTA), mild MR, reduced RV fxn, sev dil RA, mod TR, PASP 50 mmHg, small to mod circumferential pericardial effusion.  Marland Kitchen GERD (gastroesophageal reflux disease)   . Hepatic cirrhosis (HCC)    a. 03/2016 Abd CT: evidence for hepatic cirrhosis.  . Hypertensive heart disease with heart failure (HCC)   . Lower extremity edema    a. 04/2016 LE U/S: No DVT. Bilat greater saphenous vein incompetence.  . Multiple thyroid nodules    a. 05/2016 Thyroid U/S: Bilat nodules & cysts, largest in left thyroid lobe, 1.4 cm; solid nodule along the medial right thyroid lobe measuring 1.0 cm - rec u/s guided needle bx.  . Pericardial effusion    a. 03/2016 Abd CT: mod to large pericardial effusion; b. 04/2016 Echo: small to mod circumferential pericardial effusion.  . Persistent atrial fibrillation (HCC)    a. On eliquis (CHA2DS2VASc = 3).  . Pleural effusion on right    a. 03/2016 incidental finding on abd CT; b. 04/2016 R pl effusion noted on CTA Chest.  . Severe aortic stenosis     Past Surgical History:  Procedure Laterality Date  . BACK SURGERY     at age 89 years old  . goiter removed     patient states at age 48  . TONSILLECTOMY     patient states at age 55     Allergies  Allergies  Allergen Reactions  . Potassium-Containing Compounds Other (See  Comments)    Upset stomach    Inpatient Medications    . apixaban  5 mg Oral BID  . carvedilol  6.25 mg Oral BID WC  . famotidine  10 mg Oral Daily  . furosemide  80 mg Intravenous BID  . metoprolol  5 mg Intravenous Once  . potassium chloride SA  20 mEq Oral Daily  . sodium chloride flush  3 mL Intravenous Q12H    Family History    Family History  Problem Relation Age of Onset  . Cancer Mother   .  Heart disease Father   . Alcohol abuse Brother   . Cancer Brother   . Cancer Maternal Grandmother   . Alcohol abuse Maternal Grandfather   . Alcohol abuse Paternal Grandfather     Social History    Social History   Social History  . Marital status: Single    Spouse name: N/A  . Number of children: N/A  . Years of education: N/A   Occupational History  . Not on file.   Social History Main Topics  . Smoking status: Former Smoker    Packs/day: 1.00    Years: 10.00    Types: Cigarettes    Quit date: 04/05/2000  . Smokeless tobacco: Not on file  . Alcohol use No  . Drug use: No  . Sexual activity: Not on file   Other Topics Concern  . Not on file   Social History Narrative  . No narrative on file     Review of Systems  General:  No chills, fever, night sweats or weight changes.  Cardiovascular:  No chest pain, +++ dyspnea on exertion, +++ edema, no orthopnea, palpitations, paroxysmal nocturnal dyspnea, +++ early satiety. Dermatological: No rash, lesions/masses Respiratory: +++ cough w/ clear sputum, +++ dyspnea Urologic: No hematuria, dysuria Abdominal:   +++ intermittent nausea w/ chronic LLQ abd discomfofrt, no vomiting, diarrhea, bright red blood per rectum, melena, or hematemesis Neurologic:  No visual changes, wkns, changes in mental status. All other systems reviewed and are otherwise negative except as noted above.    Physical Exam    Blood pressure 107/75, pulse 81, temperature 98.7 F (37.1 C), temperature source Oral, resp. rate (!) 21, height 5'  8" (1.727 m), weight 197 lb 8 oz (89.6 kg).  GEN: Well nourished, well developed, in no acute distress.  HEENT: normal.  Neck: Supple, JVP ~ 12 cm, no carotid bruits, or masses. Cardiac: IR, IR, 2/6 syst murmur heard throughout, no rubs, or gallops. No clubbing, cyanosis, 3+ bilat LE edema to knees.  Radials/DP/PT 2+ and equal bilaterally.  Respiratory:  Respirations regular and unlabored, clear to auscultation bilaterally. GI: Soft, nontender, nondistended, BS + x 4. MS: no deformity or atrophy. Skin: warm and dry, no rash. Neuro:  Strength and sensation are intact. Psych: Normal affect.  Labs     Lab Results  Component Value Date   WBC 7.3 08/15/2016   HGB 13.8 08/15/2016   HCT 44.5 08/15/2016   MCV 84.8 08/15/2016   PLT 206 08/15/2016    Recent Labs Lab 08/18/16 1503  NA 141  K 3.5  CL 95*  CO2 34*  BUN 25  CREATININE 1.79*  CALCIUM 8.6  GLUCOSE 106*     Radiology Studies    No results found.  EKG & Cardiac Imaging    EKG:   Echocardiogram:   Assessment & Plan  1.  Acute on chronic systolic CHF/Cardiomyopathy:  Pt with known h/o AS with echo in June showing an EF of 20-25% with diff HK, anterior/anteroseptal/apical AK. When he was seen last week, he was markedly volume overloaded and up roughly 20 lbs from the spring, in the setting of noncompliance with lasix and coreg.    Ward Givenshris Berge, NP had a prolonged discussion at that time as to how to proceed to best evaluate his cardiomyopathy and AS, specifically regarding diagnostic catheterization, likely to be followed by thoracic surgery consultation, Also discussed admission at that time as he had concerns that oral lasix may be inadequate in providing him the diuresis  that he would need.  He preferred outpt management and arranged for f/u yesterday.  Though he did initially respond to lasix 80 mg in the am and 40 mg in the pm, this resulted in worsening of renal fxn, causing [place] to scale back his dose.  With that he  has put some fluid back on (lost 9 lbs then gained 6 of it back).  In that setting, he has had ongoing lower ext swelling and DOE.  He stated that he was now interested in admission, though says that he'd have to wait until today in order to arrange transportation.  He is now admitted for diuresis and monitoring of renal fxn.  Hold eliquis beginning on 10/14 with a plan for R and L heart cath on 10/16 provided that renal fxn stable.  Expect TCTS consult following cath.  Continue coreg (he has not been taking as Rx b/c he feels that it upsets his stomach).  No acei/arb/entresto/spiro in setting of CKD with need for diuresis and contrast.  Bidil not ideal in setting of severe aortic stenosis.  2.  Severe Aortic Stenosis:  This appears to date back to at least 2016,as he says he was previously told by his former cardiologist in [place] that he would require a valve replacement. Echo in June reconfirms severe AS as outlined above. Elective admission 10/12 for diuresis followed by R & L heart cath next week provided that renal fxn is stable.  3.  Persistent Afib:  HR was elevated upon arrival yesterday, however he was quite upset about having to pay a co-pay and once he calmed some, his HR improved to the 90's/low 100's.  Cont coreg and eliquis.  Eliquis will need to be held beginning 10/14.  4.  Hypertensive Heart Disease:  BP stable.  5. Thyroid Nodules &Cysts: Gershon Cull found incidentally on CTA chest in June with dedicated thyroid u/s in July confirming.  He has prev been referred to endocrine for u/s guided needle bx, however he did not follow through on this. I put in for another referral last week, however he has not heard anything from endocrine.  His TSH was nl in May.  We will need to keep this in mind moving forward as he will likely require another referral following hospital d/c.  6. Cholelithiasis: CT of abdomen back in May showed a very large gallstone. He was referred to GI but has  yet to f/u. He says that he is scheduled to f/u with GI on 10/20. His abd discomfort is more LLQ vs RUQ.  He has had nl BM's and says that appetite has been stable.  I question to what extent passive congestion from CHF as well as finding of cirrhosis (noted 03/2016 CT) might be playing a role in abd Ss.   7.  CKD III:  Worsening of creat with escalation of oral lasix.  Suspect he is not metabolizing it well in the setting of massive volume overload.  Follow creat with IV diuresis.  8.  Noncompliance:  He says that he has been taking meds as Rx since last week.   Signed, Little Ishikawa, NP 08/24/2016, 1:30 PM Pager: [phone]  Patient seen and independently examined with Suzzette Righter, NP. We discussed all aspects of the encounter. I agree with the assessment and plan as stated above.  Patient now admitted with diuresis and monitoring of kidney function prior to right and left heart cath on Monday for DCM with CHF ad EF 20% as well as  severe AS.  Will check CMET, CBC, PT, INR on admit.  Start Lasix IV, continue BB.  No  ACE I/ARB due to CKD.  No hydralazine or nitrates due to severe AS.  Will hold Eliquis starting 10/14 in preparation for cath on Monday.  Signed: Armanda Magic, MD [facility] 08/24/2016

## 2016-08-25 ENCOUNTER — Inpatient Hospital Stay (HOSPITAL_COMMUNITY): Payer: Commercial Managed Care - HMO

## 2016-08-25 DIAGNOSIS — I4891 Unspecified atrial fibrillation: Secondary | ICD-10-CM

## 2016-08-25 DIAGNOSIS — Z515 Encounter for palliative care: Secondary | ICD-10-CM

## 2016-08-25 DIAGNOSIS — R570 Cardiogenic shock: Secondary | ICD-10-CM

## 2016-08-25 LAB — BASIC METABOLIC PANEL
Anion gap: 11 (ref 5–15)
BUN: 49 mg/dL — AB (ref 6–20)
CHLORIDE: 98 mmol/L — AB (ref 101–111)
CO2: 28 mmol/L (ref 22–32)
CREATININE: 3.39 mg/dL — AB (ref 0.61–1.24)
Calcium: 8.9 mg/dL (ref 8.9–10.3)
GFR calc Af Amer: 20 mL/min — ABNORMAL LOW (ref >60)
GFR calc non Af Amer: 17 mL/min — ABNORMAL LOW (ref >60)
Glucose, Bld: 110 mg/dL — ABNORMAL HIGH (ref 65–99)
Potassium: 3.9 mmol/L (ref 3.5–5.1)
SODIUM: 137 mmol/L (ref 135–145)

## 2016-08-25 LAB — MRSA PCR SCREENING: MRSA by PCR: NEGATIVE

## 2016-08-25 MED ORDER — NOREPINEPHRINE BITARTRATE 1 MG/ML IV SOLN
0.0000 ug/min | INTRAVENOUS | Status: DC
  Filled 2016-08-25: qty 4

## 2016-08-25 MED ORDER — AMIODARONE HCL IN DEXTROSE 360-4.14 MG/200ML-% IV SOLN
30.0000 mg/h | INTRAVENOUS | Status: DC
  Administered 2016-08-25 – 2016-09-04 (×21): 30 mg/h via INTRAVENOUS
  Filled 2016-08-25 (×23): qty 200

## 2016-08-25 MED ORDER — HEPARIN (PORCINE) IN NACL 100-0.45 UNIT/ML-% IJ SOLN
1450.0000 [IU]/h | INTRAMUSCULAR | Status: DC
  Administered 2016-08-25 – 2016-08-26 (×2): 1300 [IU]/h via INTRAVENOUS
  Administered 2016-08-27 – 2016-08-29 (×3): 1450 [IU]/h via INTRAVENOUS
  Filled 2016-08-25 (×5): qty 250

## 2016-08-25 MED ORDER — FUROSEMIDE 10 MG/ML IJ SOLN
80.0000 mg | Freq: Two times a day (BID) | INTRAMUSCULAR | Status: DC
  Administered 2016-08-25: 80 mg via INTRAVENOUS

## 2016-08-25 MED ORDER — FUROSEMIDE 10 MG/ML IJ SOLN
80.0000 mg | Freq: Once | INTRAMUSCULAR | Status: AC
  Administered 2016-08-25: 80 mg via INTRAVENOUS
  Filled 2016-08-25: qty 8

## 2016-08-25 MED ORDER — AMIODARONE HCL IN DEXTROSE 360-4.14 MG/200ML-% IV SOLN
60.0000 mg/h | INTRAVENOUS | Status: AC
  Administered 2016-08-25: 60 mg/h via INTRAVENOUS

## 2016-08-25 MED ORDER — FUROSEMIDE 10 MG/ML IJ SOLN
80.0000 mg | Freq: Two times a day (BID) | INTRAMUSCULAR | Status: DC
  Administered 2016-08-26 – 2016-08-29 (×8): 80 mg via INTRAVENOUS
  Filled 2016-08-25 (×10): qty 8

## 2016-08-25 MED ORDER — NOREPINEPHRINE BITARTRATE 1 MG/ML IV SOLN
0.0000 ug/min | INTRAVENOUS | Status: DC
  Administered 2016-09-05: 1 ug/min via INTRAVENOUS
  Filled 2016-08-25: qty 4

## 2016-08-25 MED ORDER — AMIODARONE HCL IN DEXTROSE 360-4.14 MG/200ML-% IV SOLN
INTRAVENOUS | Status: AC
  Filled 2016-08-25: qty 200

## 2016-08-25 NOTE — Consult Note (Signed)
Consultation Note Date: 08/25/2016   Patient Name: Lucas Munoz  DOB: November 23, 1950  MRN: [id number]  Age / Sex: 65 y.o., male  PCP: [name] Orvan Falconer, MD Referring Physician: Lennette Bihari, MD  Reason for Consultation: Establishing goals of care and Psychosocial/spiritual support  HPI/Patient Profile: 65 y.o. male  with past medical history of hypertension, severe aortic stenosis, LV dysfunction, noncompliance, thyroid nodules, pericardial effusion, cirrhosis, cholecystolithiasis, persistent atrial fib on eliquis, CKD III was admitted on 08/24/2016 with volume overload and further workup for aortic stenosis and congestive heart failure. Echocardiogram performed 04/28/2016 showed an EF of 20-25%, severe aortic stenosis mild mitral regurgitation. Patient reports that he is been noncompliant with treatment because he feels this physician that he has seen in [facility] is someone who damaged his heart with the medications he prescribed. He was unable to tell me what they were except that he inhaled some white powder which made him this sick.Marland Kitchen He reports feeling much better since he's been admitted to [facility]. He denies any lack of confidence or concerns with Dr. Tresa Endo or his team. He states that whenever he recommends he will do. He denies ever having other problems with medical providers in the past.   Clinical Assessment and Goals of Care: Patient is alert and oriented 3. His speech is mildly pressured. His thought processes circumstantial. There is a flavor of chronic paranoia, suspiciousness about him but he only acknowledges fearfulness in terms of this one particular physician. When I asked him about why he refused to have a central line put in, or further workup for his thyroid nodules or further workup for his GI complaints in the past as directed by [facility] health team, or Dr. Tresa Endo he becomes vague and  states "I'm trying to decide who to trust here" as well as "I didn't think I needed those things". He states he's learned a hard lesson particularly as it relates to his heart. It confusing to talk to him about his CODE STATUS as well. He states my doctors know that I don't want to be resuscitated. I pointed out to him that his goals however verbally to [place] are stating that he does want CPR or defibrillation, drugs to maintain his blood pressure which are all forms of life support but no intubation. He said  "yes that is what I want". He also informs me that he is unmarried but has 2 daughters from whom he is "half estranged". He has one daughter  that lives in [place] and 1 daughter that lives in [place]. He seems closest to his sister-in-law who lives in [place].  PATIENT. Pt's SIL does not have HCPOA so by default his daughters do. He was unable to give me their phone numbers    SUMMARY OF RECOMMENDATIONS   States he will have cardiac catheter on 08/28/2016 States he will not have a central line but agreed to a PICC line Verbalizes he will be compliant with oral medications as prescribed by Dr. Tresa Endo Also verbalizing  that he would go through with valvular replacement if he was a candidate but then states I need to go back to work for a little while. If issues regarding his ability to make appropriate decisions are a concern would recommend a psychiatric evaluation for capacity Palliative medicine team to follow up over this weekend to see if we can talk further about goals of care and also clarify his CODE STATUS Code Status/Advance Care Planning:  Limited code   Palliative Prophylaxis:   Aspiration, Bowel Regimen, Delirium Protocol, Frequent Pain Assessment, Oral Care and Turn Reposition  Additional Recommendations (Limitations, Scope, Preferences):  Full Scope Treatment  Psycho-social/Spiritual:   Desire for further Chaplaincy support:no  Additional  Recommendations: Referral to Community Resources   Prognosis:   < 6 months in the setting of severe aortic stenosis, chronic kidney disease stage 3-4, cardiomyopathy with global hypokinesis and ejection fraction of 20-25%, chronic atrial fib, cirrhosis of the liver  Discharge Planning: To Be Determined      Primary Diagnoses: Present on Admission: . Acute on chronic systolic CHF (congestive heart failure) (HCC)   I have reviewed the medical record, interviewed the patient and family, and examined the patient. The following aspects are pertinent.  Past Medical History:  Diagnosis Date  . Cardiomyopathy (HCC)    a. 04/2016 Echo: EF 20-25%.  . Cataract    right side per patient  . Cholelithiasis    a. 03/2016 Abd CT: 4.1 x 3.4 cm gallstone and other tiny stones in the gb fundus-->referred to GI.  Marland Kitchen. Chronic bronchitis (HCC)   . Chronic pulmonary embolism (HCC)    a. 04/2016 CTA Chest: Sequelae of chronic pulm thromboembolism in RLL. No acute PE.  Marland Kitchen. Chronic systolic CHF (congestive heart failure) (HCC)    a. 04/2016 Echo: EF 20-25%, mod LVH, diff HK w/ anterior, anteroseptal, and apical AK, sev AS (mean grad 40 mmHg, peak grad 66 mmHg), Asc Ao diameter 45 mm - ? flap (not seen on f/u CTA), mild MR, reduced RV fxn, sev dil RA, mod TR, PASP 50 mmHg, small to mod circumferential pericardial effusion.  Marland Kitchen. GERD (gastroesophageal reflux disease)   . Heart murmur dx'd 1968  . Hepatic cirrhosis (HCC)    a. 03/2016 Abd CT: evidence for hepatic cirrhosis.  . Hypertension   . Hypertensive heart disease with heart failure (HCC)   . Lower extremity edema    a. 04/2016 LE U/S: No DVT. Bilat greater saphenous vein incompetence.  . Multiple thyroid nodules    a. 05/2016 Thyroid U/S: Bilat nodules & cysts, largest in left thyroid lobe, 1.4 cm; solid nodule along the medial right thyroid lobe measuring 1.0 cm - rec u/s guided needle bx.  . Pericardial effusion    a. 03/2016 Abd CT: mod to large pericardial  effusion; b. 04/2016 Echo: small to mod circumferential pericardial effusion.  . Persistent atrial fibrillation (HCC)    a. On eliquis (CHA2DS2VASc = 3).  . Pleural effusion on right    a. 03/2016 incidental finding on abd CT; b. 04/2016 R pl effusion noted on CTA Chest.  . Severe aortic stenosis    Social History   Social History  . Marital status: Divorced    Spouse name: N/A  . Number of children: N/A  . Years of education: N/A   Social History Main Topics  . Smoking status: Former Smoker    Packs/day: 1.00    Years: 30.00    Types: Cigarettes    Quit date: 04/05/2000  .  Smokeless tobacco: Never Used  . Alcohol use 0.0 oz/week     Comment: 08/24/2016 "stopped drinking in 1990"  . Drug use: No  . Sexual activity: Not Currently   Other Topics Concern  . None   Social History Narrative  . None   Family History  Problem Relation Age of Onset  . Cancer Mother   . Heart disease Father   . Alcohol abuse Brother   . Cancer Brother   . Cancer Maternal Grandmother   . Alcohol abuse Maternal Grandfather   . Alcohol abuse Paternal Grandfather    Scheduled Meds: . famotidine  20 mg Oral Daily  . furosemide  80 mg Intravenous BID  . sodium chloride flush  3 mL Intravenous Q12H   Continuous Infusions: . amiodarone 60 mg/hr (08/25/16 1424)  . amiodarone    . heparin    . norepinephrine (LEVOPHED) Adult infusion    . norepinephrine (LEVOPHED) Adult infusion Stopped (08/25/16 1400)   PRN Meds:.sodium chloride, acetaminophen, ondansetron (ZOFRAN) IV, sodium chloride flush Medications Prior to Admission:  Prior to Admission medications   Medication Sig Start Date End Date Taking? Authorizing Provider  apixaban (ELIQUIS) 5 MG TABS tablet Take 1 tablet (5 mg total) by mouth 2 (two) times daily. 04/05/16  Yes Lennette Bihari, MD  carvedilol (COREG) 6.25 MG tablet Take 1 tablet (6.25 mg total) by mouth 2 (two) times daily with a meal. 08/15/16  Yes Ok Anis, NP  famotidine  (PEPCID) 20 MG tablet Take 20 mg by mouth daily.   Yes Historical Provider, MD  furosemide (LASIX) 80 MG tablet Take 1/2 tablet in the AM and 1/2 tablet in the evening Patient taking differently: Take 40 mg by mouth 2 (two) times daily.  08/21/16  Yes Ok Anis, NP  ibuprofen (ADVIL,MOTRIN) 200 MG tablet Take 400 mg by mouth every 6 (six) hours as needed for headache or moderate pain.   Yes Historical Provider, MD  potassium chloride SA (K-DUR,KLOR-CON) 20 MEQ tablet Take 1 tablet (20 mEq total) by mouth daily. 08/16/16  Yes Ok Anis, NP   Allergies  Allergen Reactions  . Potassium-Containing Compounds Other (See Comments)    Upset stomach   Review of Systems  Constitutional: Positive for activity change and fatigue.  HENT: Negative.   Eyes: Negative.   Respiratory: Positive for shortness of breath.   Cardiovascular: Negative.   Gastrointestinal: Negative.   Endocrine: Negative.   Genitourinary: Negative.   Musculoskeletal: Negative.   Skin: Negative.   Neurological: Positive for weakness.  Hematological: Negative.   Psychiatric/Behavioral: Negative.     Physical Exam  Constitutional: He is oriented to person, place, and time. He appears well-developed.  HENT:  Head: Normocephalic and atraumatic.  Neck: Normal range of motion.  Cardiovascular:  Irrg, Pressure soft. Now on levophed  Pulmonary/Chest: Effort normal.  Musculoskeletal: Normal range of motion.  Neurological: He is alert and oriented to person, place, and time.  Skin: Skin is warm and dry.  Psychiatric:  Speech slightly pressured Thought processes circumstantial. Flavor of paranoia but he denies feeling suspicious of medical providers just one in particular  Nursing note and vitals reviewed.   Vital Signs: BP 101/82   Pulse (!) 110   Temp 97.8 F (36.6 C) (Oral)   Resp (!) 22   Ht 5\' 8"  (1.727 m)   Wt 90.5 kg (199 lb 8 oz) Comment: scale c  SpO2 96%   BMI 30.33 kg/m  Pain Assessment:  No/denies pain  SpO2: SpO2: 96 % O2 Device:SpO2: 96 % O2 Flow Rate: .   IO: Intake/output summary:  Intake/Output Summary (Last 24 hours) at 08/25/16 1633 Last data filed at 08/25/16 1424  Gross per 24 hour  Intake              123 ml  Output              575 ml  Net             -452 ml    LBM: Last BM Date: 08/22/16 Baseline Weight: Weight: 89.6 kg (197 lb 8.5 oz) Most recent weight: Weight: 90.5 kg (199 lb 8 oz) (scale c)     Palliative Assessment/Data:   Flowsheet Rows   Flowsheet Row Most Recent Value  Intake Tab  Referral Department  Hospitalist  Unit at Time of Referral  Cardiac/Telemetry Unit  Palliative Care Primary Diagnosis  Cardiac  Date Notified  08/25/16  Palliative Care Type  New Palliative care  Reason for referral  Clarify Goals of Care, Psychosocial or Spiritual support  Date of Admission  08/24/16  Date first seen by Palliative Care  08/25/16  # of days Palliative referral response time  0 Day(s)  # of days IP prior to Palliative referral  1  Clinical Assessment  Palliative Performance Scale Score  40%  Pain Max last 24 hours  0  Pain Min Last 24 hours  0  Dyspnea Max Last 24 Hours  0  Dyspnea Min Last 24 hours  0  Nausea Max Last 24 Hours  0  Nausea Min Last 24 Hours  0  Anxiety Max Last 24 Hours  0  Anxiety Min Last 24 Hours  0  Psychosocial & Spiritual Assessment  Palliative Care Outcomes  Patient/Family meeting held?  Yes  Who was at the meeting?  pt  Palliative Care follow-up planned  Yes, Facility      Time In: 1530 Time Out: 1640 Time Total: 70 min Greater than 50%  of this time was spent counseling and coordinating care related to the above assessment and plan.  Signed by: Irean Hong, NP   Please contact Palliative Medicine Team phone at [phone] for questions and concerns.  For individual provider: See Loretha Stapler

## 2016-08-25 NOTE — Progress Notes (Signed)
1203 Pt made aware of transfer

## 2016-08-25 NOTE — Discharge Instructions (Signed)

## 2016-08-25 NOTE — Progress Notes (Signed)
1300.Dr Mclean seen the pt Discussed plan of care.with orders . 1315 . Offered to call wife about transfer. Pt prefers to call himself. Pt transferred to 2h  Via bed . CMt awrae of transfeer Endorsed new order  To [employer]  Pt not on apparent distress

## 2016-08-25 NOTE — Progress Notes (Signed)
Patient has a low BP of 84/62, physician notified.

## 2016-08-25 NOTE — Progress Notes (Signed)
1208 Report given to [place] ,2h 02 RN

## 2016-08-25 NOTE — Progress Notes (Signed)
Advanced Heart Failure Rounding Note  PCP: Wilmer Floor., MD Primary Cardiologist: Dr. Tresa Endo  Subjective:    65 y/o ?with a h/o severe AS, LV dysfxn, HTN, noncompliance, chronic PE, cirrhosis by imaging, thyroid nodules, pericardial effusion, cholelithiasis, and persistent AF on eliquis, who presented to clinic on 08/23/16 with volume overload and was admitted for further work up of AS and CHF.    Echo 04/28/16 LVEF 20-25%, Severe AS - Mean gradient 40, Peak 66, ? Flap representing AA disection, Mild MR, Moderate LAE, RV mod dilated and reduced, Severe RAE, PA peak pressure 50 mm Hg.   No complaints currently.  Eating, drinking, and peeing better. He said prior to admission he had barely any UO. Also had nausea with abdominal pain. Ate two full meals this morning he is feeling so much better.  Denies lightheadedness or dizziness.   Less SOB, but still DOE with mild exertion. (NYHA IIIb)  Objective:   Weight Range: 199 lb 8 oz (90.5 kg) Body mass index is 30.33 kg/m.   Vital Signs:   Temp:  [97.5 F (36.4 C)-98.7 F (37.1 C)] 97.8 F (36.6 C) (10/13 0846) Pulse Rate:  [70-122] 78 (10/13 0846) Resp:  [16-21] 18 (10/13 0846) BP: (70-107)/(50-75) 84/62 (10/13 0846) SpO2:  [96 %-100 %] 98 % (10/13 0846) Weight:  [197 lb 8.5 oz (89.6 kg)-199 lb 8 oz (90.5 kg)] 199 lb 8 oz (90.5 kg) (10/13 0440) Last BM Date: 08/22/16  Weight change: Filed Weights   08/24/16 1308 08/25/16 0440  Weight: 197 lb 8.5 oz (89.6 kg) 199 lb 8 oz (90.5 kg)    Intake/Output:   Intake/Output Summary (Last 24 hours) at 08/25/16 1048 Last data filed at 08/25/16 0818  Gross per 24 hour  Intake              123 ml  Output              400 ml  Net             -277 ml     Physical Exam: General:  Elderly appearing.  HEENT: normal Neck: supple. JVP 14-16, Carotids 2+ bilat; no bruits. No lymphadenopathy or thyromegaly appreciated. Cor: PMI nondisplaced. Irregular, tachy S1S2, 3/6  crescendo-decrescendo murmur RUSB with obscured S2 Lungs: Diminished right base.  Abdomen: soft, NT, ND, no HSM. No bruits or masses. +BS  Extremities: no cyanosis, clubbing, rash. 1+ edema to thighs Neuro: alert & orientedx3, cranial nerves grossly intact. moves all 4 extremities w/o difficulty. Affect pleasant  Telemetry: Reviewed, Afib RVR ranging from 100-120s  Labs: CBC  Recent Labs  08/24/16 1448  WBC 9.1  NEUTROABS 7.1  HGB 14.1  HCT 45.3  MCV 86.5  PLT 176   Basic Metabolic Panel  Recent Labs  08/24/16 1448 08/25/16 0412  NA 137 137  K 4.4 3.9  CL 99* 98*  CO2 22 28  GLUCOSE 137* 110*  BUN 43* 49*  CREATININE 3.42* 3.39*  CALCIUM 9.2 8.9   Liver Function Tests  Recent Labs  08/24/16 1448  AST 204*  ALT 146*  ALKPHOS 82  BILITOT 6.1*  PROT 6.4*  ALBUMIN 3.1*   No results for input(s): LIPASE, AMYLASE in the last 72 hours. Cardiac Enzymes No results for input(s): CKTOTAL, CKMB, CKMBINDEX, TROPONINI in the last 72 hours.  BNP: BNP (last 3 results)  Recent Labs  04/05/16 1303  BNP 1,711.3*    ProBNP (last 3 results) No results for input(s): PROBNP in  the last 8760 hours.   D-Dimer No results for input(s): DDIMER in the last 72 hours. Hemoglobin A1C No results for input(s): HGBA1C in the last 72 hours. Fasting Lipid Panel No results for input(s): CHOL, HDL, LDLCALC, TRIG, CHOLHDL, LDLDIRECT in the last 72 hours. Thyroid Function Tests  Recent Labs  08/24/16 1448  TSH 1.246    Other results:     Imaging/Studies:   No results found.  Latest Echo  Latest Cath   Medications:     Scheduled Medications: . apixaban  5 mg Oral BID  . famotidine  20 mg Oral Daily  . Influenza vac split quadrivalent PF  0.5 mL Intramuscular Tomorrow-1000  . sodium chloride flush  3 mL Intravenous Q12H     Infusions:     PRN Medications:  sodium chloride, acetaminophen, ondansetron (ZOFRAN) IV, sodium chloride  flush   Assessment/Plan   65 y/o ?with a h/o severe AS, LV dysfxn, HTN, noncompliance, thyroid nodules, pericardial effusion, cholelithiasis, and persistent AF on eliquis, who presented to clinic on 08/23/16 with volume overload and was admitted for further work up of AS and CHF.   1. Acute on chronic systolic CHF/Cardiomyopathy - Remains volume overloaded on exam. Lasix held thus far with soft pressures. Request in for Stepdown/CCU bed so we can initiate levophed with soft pressures.  Likely has some component of cardiogenic shock. NYHA Class IIIb symptoms.  - Plan starting levophed once in Stepdown/CCU.  - Will plan on giving IV lasix and gauging response.  - Pt would likely benefit from Central Access.   PICC poor option currently with ARF.  2. Persistent Afib, with RVR up to 120-130s - Continue eliquis. Plan on stopping tomorrow for cath Monday.  - Plan on starting Amio once we can get stepdown and start Levo for soft pressures 3. Severe AS by Echo 04/2016 - Needs re-quantification. TTE vs TEE.  Has cath planned for next week. - May need consideration for AVR.  4. AKI on CKD III - 1.79 -> 3.42 yesterday.  Likely cardio renal vs ATN with hypotension and arrythmias.  - Continue to follow closely.  He still needs some diuresis as above, but need to improve hemodynamics with soft pressures.  Plan on levophed as above. Will discuss diuretic dosing with MD. May need milrinone.  5. Non compliance - Pt excitable and slightly agitated but states he was "satisfied" with our meeting. Pt states "no treatment" over the past year or so  "made his heart worse", despite the notes making it clear that he has deferred ongoing evaluation time and time again.   - Pts general distrust of medicine would make him a difficult candidate therapy for advanced therapies at best.   Awaiting Stepdown. Then will plan Levophed + Initiation of Amiodarone.  Needs further diuresis as well. Limited currently from  cardiorenal standpoint.  Length of Stay: 1  Graciella Freer PA-C 08/25/2016, 10:48 AM   Advanced Heart Failure Team Pager [phone] (M-F; 7a - 4p)  Please contact [facility] for night-coverage after hours (4p -7a ) and weekends on [email or web address]   Patient seen with PA, agree with the above note.   1. Acute on chronic systolic CHF: EF 31-51% in setting of severe aortic stenosis.  Cause uncertain, could be from the valvular disease itself but cannot rule out underlying CAD.  Has not had prior cardiac cath.  This admission, he has been hypotensive with SBP in 80s though BP has improved this afternoon in the 100s range.  He is also markedly volume overloaded.  I suspect low cardiac output.   - He needs central access.  He refused CVL this afternoon but is agreeing to PICC.  Need to place today.  After central access available, will follow CVP and co-ox.  If co-ox is low, will start milrinone 0.25 (will need amiodarone gtt for rate control).   - Lasix 80 mg IV bid, 1st dose now.  - Can use norepinephrine if BP drops again.  2. Aortic stenosis: Severe AS.  Will need AVR, may be best suited for TAVR.  Will need to be more stable prior to AVR.  3. AKI on CKD stage III: Baseline creatinine seems to be around 1.7-1.8, now up to 3.4.  Possible ATN in setting of hypotension. BP better now.  As above, will check co-ox and add inotropy if low.  Will use norepinephrine as needed to keep BP up, though current SBP stable in 100s.  4. Atrial fibrillation: Persistent, with RVR this admission.  He has been in atrial fibrillation at least since 6/17.  No prior DCCV.  Not sure how long prior to 6/17 he was in atrial fibrillation.  - Will use amiodarone gtt for now for rate control.  - Will hold apixaban as may need procedures and will put him on a heparin gtt.  5. Noncompliance: From prior notes, patient has a history of medical noncompliance.  He does not have a lot of trust in the medical system in general.     45 minutes critical care time.   Marca AnconaDalton Harmonie Verrastro 08/25/2016 5:03 PM

## 2016-08-25 NOTE — Progress Notes (Signed)
Nutrition Education Note  RD consulted for nutrition education regarding new onset CHF.  RD provided "Low Sodium Nutrition Therapy" handout from the Academy of [employer] and [employer]etics. Reviewed patient's dietary recall. Provided examples on ways to decrease sodium intake in diet. Discouraged intake of processed foods and use of salt shaker. Encouraged fresh fruits and vegetables as well as whole grain sources of carbohydrates to maximize fiber intake.   RD discussed why it is important for patient to adhere to diet recommendations, and emphasized the role of fluids, foods to avoid, and importance of weighing self daily. Teach back method used.  Expect good compliance. Pt states that he likes to cook and can use spices and onions instead of salt. He expresses motivation to make changes.   Body mass index is 30.33 kg/m. Pt meets criteria for Obesitu based on current BMI.  Current diet order is Heart Healthy, patient is consuming approximately 100% of meals at this time. He reports eating poorly PTA due to stomach pain and emesis with PO intake which he relates to fluid overload. He reports feeling better and eating well today. Labs and medications reviewed. No further nutrition interventions warranted at this time. RD contact information provided. If additional nutrition issues arise, please re-consult RD.   Dorothea Ogle RD, CSP, LDN Inpatient Clinical Dietitian Pager: [phone] After Hours Pager: [phone]

## 2016-08-25 NOTE — Progress Notes (Addendum)
SUBJECTIVE:   No complaints  OBJECTIVE:   Vitals:   Vitals:   08/24/16 2050 08/24/16 2100 08/25/16 0440 08/25/16 0846  BP: (!) 84/62 (!) 79/58 (!) 72/51 (!) 84/62  Pulse:  70 (!) 120 78  Resp:  20 18 18   Temp:  97.5 F (36.4 C) 97.5 F (36.4 C) 97.8 F (36.6 C)  TempSrc:  Oral Oral Oral  SpO2:  96% 100% 98%  Weight:   199 lb 8 oz (90.5 kg)   Height:       I&O's:   Intake/Output Summary (Last 24 hours) at 08/25/16 0958 Last data filed at 08/25/16 0818  Gross per 24 hour  Intake              123 ml  Output              400 ml  Net             -277 ml   TELEMETRY: Reviewed telemetry pt in atrial fibrillation with RVR     PHYSICAL EXAM General: Well developed, well nourished, in no acute distress Head: Eyes PERRLA, No xanthomas.   Normal cephalic and atramatic  Lungs: crackles at bases bilaterally Heart:   Irregularly irregular and tachy S1 S2 Pulses are 2+ & equal. Abdomen: Bowel sounds are positive, abdomen soft and non-tender without masses  Msk:  Back normal, normal gait. Normal strength and tone for age. Extremities:   No clubbing, cyanosis or edema.  DP +1 Neuro: Alert and oriented X 3. Psych:  Good affect, responds appropriately   LABS: Basic Metabolic Panel:  Recent Labs  11/06/82 1448 08/25/16 0412  NA 137 137  K 4.4 3.9  CL 99* 98*  CO2 22 28  GLUCOSE 137* 110*  BUN 43* 49*  CREATININE 3.42* 3.39*  CALCIUM 9.2 8.9   Liver Function Tests:  Recent Labs  08/24/16 1448  AST 204*  ALT 146*  ALKPHOS 82  BILITOT 6.1*  PROT 6.4*  ALBUMIN 3.1*   No results for input(s): LIPASE, AMYLASE in the last 72 hours. CBC:  Recent Labs  08/24/16 1448  WBC 9.1  NEUTROABS 7.1  HGB 14.1  HCT 45.3  MCV 86.5  PLT 176   Cardiac Enzymes: No results for input(s): CKTOTAL, CKMB, CKMBINDEX, TROPONINI in the last 72 hours. BNP: Invalid input(s): POCBNP D-Dimer: No results for input(s): DDIMER in the last 72 hours. Hemoglobin A1C: No results for  input(s): HGBA1C in the last 72 hours. Fasting Lipid Panel: No results for input(s): CHOL, HDL, LDLCALC, TRIG, CHOLHDL, LDLDIRECT in the last 72 hours. Thyroid Function Tests:  Recent Labs  08/24/16 1448  TSH 1.246   Anemia Panel: No results for input(s): VITAMINB12, FOLATE, FERRITIN, TIBC, IRON, RETICCTPCT in the last 72 hours. Coag Panel:   No results found for: INR, PROTIME  RADIOLOGY: No results found.  Assessment & Plan  1. Acute on chronic systolic CHF/Cardiomyopathy: Pt with known h/o AS with echo in June showing an EF of 20-25% with diff HK, anterior/anteroseptal/apical AK. When he was seen last week, he was markedly volume overloaded and up roughly 20 lbs from the spring, in the setting of noncompliance with lasix and coreg.   Lucas Givens, NP had a prolonged discussion at that time as to how to proceed to best evaluate his cardiomyopathy and AS, specifically regarding diagnostic catheterization, likely to be followed by thoracic surgery consultation, Also discussed admission at that time as he had concerns that oral lasix may be  inadequate in providing him the diuresis that he would need. He preferred outpt management and arranged for f/u yesterday. Though he did initially respond to lasix 80 mg in the am and 40 mg in the pm, this resulted in worsening of renal fxn, causing [place] to scale back his dose. With that he has put some fluid back on (lost 9 lbs then gained 6 of it back). In that setting, he has had ongoing lower ext swelling and DOE. He stated that he was now interested in admission and was admitted yesterday for diuresis and monitoring of renal fxn. He was started on IV Lasix but has not had much output.  He was hypotensive all night but asymptomatic and diuretics and BB held  - Will hold eliquis tonight and start IV Heparin per pharmacy  with a plan for R and L heart cath on 10/16 provided that renal fxn stable.  - Expect TCTS consult following cath.  - No  acei/arb/entresto/spiro in setting of CKD with need for diuresis and contrast. Bidil not ideal in setting of severe aortic stenosis.   - Will stop BB for now due to hypotension.   - His is only net neg 277cc since admission but diuretics held last night due to hypotension and he remains hypotensive. - I will transfer to CCU  given hypotension for more intense monitoring of BP while diuresing especially in setting of rapid afib. - Start low dose IV Levophed for pressor support.  His HR needs to be slowed down but due to low BP cannot use BB.  Hopefully we can get his pressure up with low dose Levo and start IV Amio to get HR under better control.  Suspect his poor diuresis is due to reduced CO and low renal blood flow. - I will ask advanced HF to see now for help in management.   2. Severe Aortic Stenosis: This appears to date back to at least 2016,as he says he was previously told by his former cardiologist in [place] that he would require a valve replacement. Echo in June reconfirms severe AS as outlined above. Electively admitted 10/12 for diuresis followed by R & L heart cath next week provided that renal fxn is stable.  Echo 04/2016 showed severe AS with mean AVG of in setting of EF 20-25%.    3. Persistent Afib: In review of prior notes, it looks like he was seen by Dr. Tresa Endo in June and diagnosed with afib and was started on Eliquis and was supposed to followup in 3-4 weeks but never did until he saw Lucas Munoz 10/3.  He appears to have been on Eliquis since June and says he has been compliant with it.   Now with RVR in the 120-130's.  Will stop BB due to hypotension.  May not tolerate Amio IV due to hypotension and severe AS. Will transfer to CCU and start Levophed for pressor support and then once SBP >20mmHg will add IV Amio cautiously and slowly to avoid further hypotension.  Hopefully BP will improve with better HR control.  Eliquis no stopped and will start IV Heparin gtt per  pharmacy tonight as he had am dose of Eliquis.   If we cannot get HR controlled on IV Amio may need emergent DCCV but will hold off since he ate this am.    4. Cardiogenic shock - SBP has been in the 70's all night and diuretics and BB held.  Will transfer to CCU and get AHF service involved.  Suspect this is multifactorial from severe AS, severe LV dysfunction, rapid afib all contributing to reduced CO.  5. Thyroid Nodules &Cysts: Lucas Cullnially found incidentally on CTA chest in June with dedicated thyroid u/s in July confirming. He has prev been referred to endocrine for u/s guided needle bx,however he did not follow through on this. I put in for another referral last week, however he has not heard anything from endocrine. His TSH was nl in May. We will need to keep this in mind moving forward as he will likely require another referral following hospital d/c.  6. Cholelithiasis: CT of abdomen back in May showed a very large gallstone. He was referred to GI but has yet to f/u. He says that he is scheduled to f/u with GI on 10/20. His abd discomfort is more LLQ vs RUQ. He has had nl BM's and says that appetite has been stable. I question to what extent passive congestion from CHF as well as finding of cirrhosis (noted 03/2016 CT) might be playing a role in abd Ss.   7. CKD III: Worsening of creat with escalation of oral lasix. Suspect he is not metabolizing it well in the setting of massive volume overload. Creatinine slightly improved. Continue to follow.  Creatinine 1.6 on 10/3.  Holding diuresis due to hypotension.  8. Noncompliance: He says that he has been taking meds as Rx since last week.  9.  Elevated LFTs secondary to CHF and cardiogenic shock.  Continue to follow.    The patient is critically ill with multiple organ systems failure and requires high complexity decision making for assessment and support, frequent evaluation and titration of therapies, application of advanced  monitoring technologies and extensive interpretation of multiple databases. Critical Care Time devoted to patient care services described in this note independent of APP time is 60 minutes with >50% of time spent in direct patient care.     Lucas Magicraci Ivanell Deshotel, MD  08/25/2016  9:58 AM

## 2016-08-25 NOTE — Progress Notes (Signed)
Requested to place CVL by HF team  Discussed informed consent w/ patient, this included: risk/benefit and reason for therapy.   He politely declined CVL placement at this point.  I am here until 1600 today after that the on-call CCM provider will be available.  Have requested that nursing notify us should his clinical exam decline OR should he change his mind.    Simonne Martinet ACNP-BC [facility] Pulmonary/Critical Care Pager # [phone] OR # [phone] if no answer

## 2016-08-25 NOTE — Progress Notes (Signed)
ANTICOAGULATION CONSULT NOTE - Initial Consult  Pharmacy Consult for Heparin Indication: atrial fibrillation  Allergies  Allergen Reactions  . Potassium-Containing Compounds Other (See Comments)    Upset stomach    Patient Measurements: Height: 5\' 8"  (172.7 cm) Weight: 199 lb 8 oz (90.5 kg) (scale c) IBW/kg (Calculated) : 68.4 Heparin Dosing Weight: 86.8kg  Vital Signs: Temp: 97.8 F (36.6 C) (10/13 0846) Temp Source: Oral (10/13 0846) BP: 84/62 (10/13 0846) Pulse Rate: 78 (10/13 0846)  Labs:  Recent Labs  08/24/16 1448 08/25/16 0412  HGB 14.1  --   HCT 45.3  --   PLT 176  --   CREATININE 3.42* 3.39*    Estimated Creatinine Clearance: 23.1 mL/min (by C-G formula based on SCr of 3.39 mg/dL (H)).   Medical History: Past Medical History:  Diagnosis Date  . Cardiomyopathy (HCC)    a. 04/2016 Echo: EF 20-25%.  . Cataract    right side per patient  . Cholelithiasis    a. 03/2016 Abd CT: 4.1 x 3.4 cm gallstone and other tiny stones in the gb fundus-->referred to GI.  Marland Kitchen Chronic bronchitis (HCC)   . Chronic pulmonary embolism (HCC)    a. 04/2016 CTA Chest: Sequelae of chronic pulm thromboembolism in RLL. No acute PE.  Marland Kitchen Chronic systolic CHF (congestive heart failure) (HCC)    a. 04/2016 Echo: EF 20-25%, mod LVH, diff HK w/ anterior, anteroseptal, and apical AK, sev AS (mean grad 40 mmHg, peak grad 66 mmHg), Asc Ao diameter 45 mm - ? flap (not seen on f/u CTA), mild MR, reduced RV fxn, sev dil RA, mod TR, PASP 50 mmHg, small to mod circumferential pericardial effusion.  Marland Kitchen GERD (gastroesophageal reflux disease)   . Heart murmur dx'd 1968  . Hepatic cirrhosis (HCC)    a. 03/2016 Abd CT: evidence for hepatic cirrhosis.  . Hypertension   . Hypertensive heart disease with heart failure (HCC)   . Lower extremity edema    a. 04/2016 LE U/S: No DVT. Bilat greater saphenous vein incompetence.  . Multiple thyroid nodules    a. 05/2016 Thyroid U/S: Bilat nodules & cysts, largest in  left thyroid lobe, 1.4 cm; solid nodule along the medial right thyroid lobe measuring 1.0 cm - rec u/s guided needle bx.  . Pericardial effusion    a. 03/2016 Abd CT: mod to large pericardial effusion; b. 04/2016 Echo: small to mod circumferential pericardial effusion.  . Persistent atrial fibrillation (HCC)    a. On eliquis (CHA2DS2VASc = 3).  . Pleural effusion on right    a. 03/2016 incidental finding on abd CT; b. 04/2016 R pl effusion noted on CTA Chest.  . Severe aortic stenosis     Assessment: 65yom on apixaban for afib, now transitioning to heparin in setting of cardiogenic shock and need for cath at some point. Last apixaban dose today at 1108. Will delay start of heparin until tonight when next dose would have been due. Will also use APTTs to initially monitor heparin as apixaban falsely elevates heparin levels.  Goal of Therapy:  APTT 66-102 seconds Heparin level 0.3-0.7 units/ml Monitor platelets by anticoagulation protocol: Yes   Plan:  1) At 2300 tonight, start heparin at 1300 units/hr 2) Check 8 hour APTT/heparin level 3) Daily heparin level, APTT, CBC  Fredrik Rigger 08/25/2016,1:04 PM

## 2016-08-25 NOTE — Progress Notes (Signed)
Consent for PICC placement obtained after discussion of risks, benefits, and alternatives. Patient requested placement be in the morning rather than tonight. Primary RN notified.

## 2016-08-26 ENCOUNTER — Inpatient Hospital Stay (HOSPITAL_COMMUNITY): Payer: Commercial Managed Care - HMO

## 2016-08-26 DIAGNOSIS — I509 Heart failure, unspecified: Secondary | ICD-10-CM

## 2016-08-26 LAB — CBC
HEMATOCRIT: 41.9 % (ref 39.0–52.0)
HEMOGLOBIN: 13.1 g/dL (ref 13.0–17.0)
MCH: 26.8 pg (ref 26.0–34.0)
MCHC: 31.3 g/dL (ref 30.0–36.0)
MCV: 85.9 fL (ref 78.0–100.0)
Platelets: 154 10*3/uL (ref 150–400)
RBC: 4.88 MIL/uL (ref 4.22–5.81)
RDW: 18.3 % — ABNORMAL HIGH (ref 11.5–15.5)
WBC: 5.8 10*3/uL (ref 4.0–10.5)

## 2016-08-26 LAB — BASIC METABOLIC PANEL
Anion gap: 12 (ref 5–15)
BUN: 49 mg/dL — ABNORMAL HIGH (ref 6–20)
CHLORIDE: 97 mmol/L — AB (ref 101–111)
CO2: 28 mmol/L (ref 22–32)
CREATININE: 2.64 mg/dL — AB (ref 0.61–1.24)
Calcium: 8.8 mg/dL — ABNORMAL LOW (ref 8.9–10.3)
GFR calc non Af Amer: 23 mL/min — ABNORMAL LOW (ref >60)
GFR, EST AFRICAN AMERICAN: 27 mL/min — AB (ref >60)
Glucose, Bld: 121 mg/dL — ABNORMAL HIGH (ref 65–99)
POTASSIUM: 3.6 mmol/L (ref 3.5–5.1)
SODIUM: 137 mmol/L (ref 135–145)

## 2016-08-26 LAB — COOXEMETRY PANEL
Carboxyhemoglobin: 1.5 % (ref 0.5–1.5)
Methemoglobin: 0.7 % (ref 0.0–1.5)
O2 SAT: 48.5 %
TOTAL HEMOGLOBIN: 12.4 g/dL (ref 12.0–16.0)

## 2016-08-26 LAB — HEPARIN LEVEL (UNFRACTIONATED): HEPARIN UNFRACTIONATED: 2.14 [IU]/mL — AB (ref 0.30–0.70)

## 2016-08-26 LAB — MAGNESIUM: Magnesium: 2.3 mg/dL (ref 1.7–2.4)

## 2016-08-26 LAB — ECHOCARDIOGRAM COMPLETE
Height: 68 in
WEIGHTICAEL: 3216 [oz_av]

## 2016-08-26 LAB — APTT
aPTT: 72 seconds — ABNORMAL HIGH (ref 24–36)
aPTT: 62 seconds — ABNORMAL HIGH (ref 24–36)

## 2016-08-26 LAB — CARBOXYHEMOGLOBIN - COOX: CARBOXYHEMOGLOBIN: 1.1 % (ref 0.5–1.5)

## 2016-08-26 MED ORDER — SODIUM CHLORIDE 0.9% FLUSH
10.0000 mL | INTRAVENOUS | Status: DC | PRN
  Administered 2016-09-14 (×2): 10 mL
  Filled 2016-08-26 (×2): qty 40

## 2016-08-26 MED ORDER — FAMOTIDINE 20 MG PO TABS: 20.0000 mg | ORAL_TABLET | Freq: Every day | ORAL | Status: DC

## 2016-08-26 MED ORDER — MILRINONE LACTATE IN DEXTROSE 20-5 MG/100ML-% IV SOLN
0.2500 ug/kg/min | INTRAVENOUS | Status: DC
  Administered 2016-08-26 – 2016-08-28 (×4): 0.25 ug/kg/min via INTRAVENOUS
  Administered 2016-08-29 – 2016-09-05 (×18): 0.375 ug/kg/min via INTRAVENOUS
  Administered 2016-09-06: 0.25 ug/kg/min via INTRAVENOUS
  Administered 2016-09-06: 0.375 ug/kg/min via INTRAVENOUS
  Administered 2016-09-07 – 2016-09-10 (×6): 0.25 ug/kg/min via INTRAVENOUS
  Filled 2016-08-26 (×32): qty 100

## 2016-08-26 MED ORDER — MILRINONE LACTATE IN DEXTROSE 20-5 MG/100ML-% IV SOLN: 0.2500 ug/kg/min | INTRAVENOUS | Status: DC

## 2016-08-26 MED ORDER — FAMOTIDINE 20 MG PO TABS
20.0000 mg | ORAL_TABLET | Freq: Two times a day (BID) | ORAL | Status: DC
  Administered 2016-08-26: 20 mg via ORAL
  Filled 2016-08-26: qty 1

## 2016-08-26 MED ORDER — SODIUM CHLORIDE 0.9% FLUSH
10.0000 mL | Freq: Two times a day (BID) | INTRAVENOUS | Status: DC
  Administered 2016-08-26 (×2): 10 mL
  Administered 2016-08-27: 20 mL
  Administered 2016-08-27 – 2016-09-09 (×13): 10 mL
  Administered 2016-09-10: 20 mL
  Administered 2016-09-11: 10 mL
  Administered 2016-09-11: 20 mL
  Administered 2016-09-12: 10 mL

## 2016-08-26 MED ORDER — FAMOTIDINE 20 MG PO TABS
10.0000 mg | ORAL_TABLET | Freq: Two times a day (BID) | ORAL | Status: DC
  Administered 2016-08-26 – 2016-09-05 (×20): 10 mg via ORAL
  Filled 2016-08-26 (×20): qty 1

## 2016-08-26 NOTE — Progress Notes (Signed)
ANTICOAGULATION CONSULT NOTE - Initial Consult  Pharmacy Consult for Heparin Indication: atrial fibrillation  Patient Measurements: Height: 5\' 8"  (172.7 cm) Weight: 201 lb (91.2 kg) IBW/kg (Calculated) : 68.4 Heparin Dosing Weight: 86.8kg  Vital Signs: Temp: 97.5 F (36.4 C) (10/14 0700) Temp Source: Oral (10/14 0700) BP: 97/86 (10/14 1000) Pulse Rate: 64 (10/14 1000)  Labs:  Recent Labs  08/24/16 1448 08/25/16 0412 08/26/16 0837 08/26/16 0933  HGB 14.1  --  13.1  --   HCT 45.3  --  41.9  --   PLT 176  --  154  --   APTT  --   --  72*  --   HEPARINUNFRC  --   --   --  2.14*  CREATININE 3.42* 3.39* 2.64*  --     Estimated Creatinine Clearance: 29.8 mL/min (by C-G formula based on SCr of 2.64 mg/dL (H)).   Assessment: 65yom on apixaban for afib, now transitioning to heparin in setting of cardiogenic shock and need for cath at some point. Last apixaban dose 10/13 at 1108. Heparin start was delayed until time when next dose would have been due. An 8 hour heparin level/APTT was drawn and as expected heparin level falsely elevated due to apixaban dose. Therefore, will use APTT to dose heparin until they they correlate. APTT 72 - at goal.  Goal of Therapy:  APTT 66-102 seconds Heparin level 0.3-0.7 units/ml Monitor platelets by anticoagulation protocol: Yes   Plan:  1) Continue heparin at 1300 units/hr 2) Check 8 hour APTT to confirm 3) Daily heparin level, APTT, CBC   Sherron Monday, PharmD Clinical Pharmacy Resident Pager: [phone] 08/26/16 11:42 AM

## 2016-08-26 NOTE — Progress Notes (Signed)
Co-oximetry values  ctHb 12.8 s02 47.2  F02Hb 46.3 FCOHb 1.1 FMetHb 0.8

## 2016-08-26 NOTE — Progress Notes (Signed)
ANTICOAGULATION CONSULT NOTE  Pharmacy Consult for Heparin Indication: atrial fibrillation  Patient Measurements: Height: 5\' 8"  (172.7 cm) Weight: 201 lb (91.2 kg) IBW/kg (Calculated) : 68.4 Heparin Dosing Weight: 86.8kg  Vital Signs: Temp: 97.8 F (36.6 C) (10/14 2341) Temp Source: Oral (10/14 2341) BP: 93/63 (10/14 2100) Pulse Rate: 63 (10/14 2100)  Labs:  Recent Labs  08/24/16 1448 08/25/16 0412 08/26/16 0837 08/26/16 0933 08/26/16 2046  HGB 14.1  --  13.1  --   --   HCT 45.3  --  41.9  --   --   PLT 176  --  154  --   --   APTT  --   --  72*  --  62*  HEPARINUNFRC  --   --   --  2.14*  --   CREATININE 3.42* 3.39* 2.64*  --   --     Estimated Creatinine Clearance: 29.8 mL/min (by C-G formula based on SCr of 2.64 mg/dL (H)).   Assessment: 65 y.o. male with h/o Afib, Eliquis on hold, for heparin  Goal of Therapy:  APTT 66-102 seconds Heparin level 0.3-0.7 units/ml Monitor platelets by anticoagulation protocol: Yes   Plan:  Increase Heparin  1450 units/hr Follow-up am labs.  Geannie Risen, PharmD, BCPS  08/26/16 11:54 PM

## 2016-08-26 NOTE — Progress Notes (Signed)
Respiratory Therapy charge contacted to verify CO OX result. O2 saturation reported by therapist was 47.2. MD notified. New orders obtained.

## 2016-08-26 NOTE — Progress Notes (Signed)
Peripherally Inserted Central Catheter/Midline Placement  The IV Nurse has discussed with the patient and/or persons authorized to consent for the patient, the purpose of this procedure and the potential benefits and risks involved with this procedure.  The benefits include less needle sticks, lab draws from the catheter, and the patient may be discharged home with the catheter. Risks include, but not limited to, infection, bleeding, blood clot (thrombus formation), and puncture of an artery; nerve damage and irregular heartbeat and possibility to perform a PICC exchange if needed/ordered by physician.  Alternatives to this procedure were also discussed.  Bard Power PICC patient education guide, fact sheet on infection prevention and patient information card has been provided to patient /or left at bedside.    PICC/Midline Placement Documentation  PICC Double Lumen 08/26/16 PICC Right Brachial 40 cm 0 cm (Active)  Indication for Insertion or Continuance of Line Vasoactive infusions 08/26/2016  8:22 AM  Exposed Catheter (cm) 0 cm 08/26/2016  8:22 AM  Site Assessment Clean;Dry;Intact 08/26/2016  8:22 AM  Lumen #1 Status Flushed;Saline locked;Blood return noted 08/26/2016  8:22 AM  Lumen #2 Status Flushed;Saline locked;Blood return noted 08/26/2016  8:22 AM  Dressing Type Transparent 08/26/2016  8:22 AM  Dressing Status Clean;Dry;Intact 08/26/2016  8:22 AM  Dressing Change Due 09/02/16 08/26/2016  8:22 AM       Ethelda Chick 08/26/2016, 8:24 AM

## 2016-08-26 NOTE — Progress Notes (Signed)
  Echocardiogram 2D Echocardiogram has been performed.  Arvil Chaco 08/26/2016, 11:56 AM

## 2016-08-26 NOTE — Progress Notes (Addendum)
Patient ID: Lucas Munoz, male   DOB: 06/25/1951, 65 y.o.   MRN: [id number]    SUBJECTIVE: Patient refused CVL then refused PICC last night.  He has a fair amount of paranoid ideation.  Willing to have PICC this morning.   Says he feels better.  Increased UOP, creatinine trending down, HR in 90s on amiodarone gtt and SBP in 100s-110s (better).   Scheduled Meds: . famotidine  20 mg Oral Daily  . furosemide  80 mg Intravenous BID  . sodium chloride flush  3 mL Intravenous Q12H   Continuous Infusions: . amiodarone 30 mg/hr (08/26/16 0559)  . heparin 1,300 Units/hr (08/25/16 2253)  . norepinephrine (LEVOPHED) Adult infusion    . norepinephrine (LEVOPHED) Adult infusion Stopped (08/25/16 1400)   PRN Meds:.sodium chloride, acetaminophen, ondansetron (ZOFRAN) IV, sodium chloride flush    Vitals:   08/26/16 0400 08/26/16 0500 08/26/16 0600 08/26/16 0700  BP: (!) 126/105 115/90 (!) 108/95 113/83  Pulse: 85 (!) 58 89 99  Resp: (!) 25 20 18 17   Temp: 98.4 F (36.9 C)     TempSrc: Oral     SpO2: 98% 95% 91% 94%  Weight:  201 lb (91.2 kg)    Height:        Intake/Output Summary (Last 24 hours) at 08/26/16 0740 Last data filed at 08/26/16 0600  Gross per 24 hour  Intake              685 ml  Output             1600 ml  Net             -915 ml    LABS: Basic Metabolic Panel:  Recent Labs  86/57/8409/10/29 1448 08/25/16 0412  NA 137 137  K 4.4 3.9  CL 99* 98*  CO2 22 28  GLUCOSE 137* 110*  BUN 43* 49*  CREATININE 3.42* 3.39*  CALCIUM 9.2 8.9   Liver Function Tests:  Recent Labs  08/24/16 1448  AST 204*  ALT 146*  ALKPHOS 82  BILITOT 6.1*  PROT 6.4*  ALBUMIN 3.1*   No results for input(s): LIPASE, AMYLASE in the last 72 hours. CBC:  Recent Labs  08/24/16 1448  WBC 9.1  NEUTROABS 7.1  HGB 14.1  HCT 45.3  MCV 86.5  PLT 176   Cardiac Enzymes: No results for input(s): CKTOTAL, CKMB, CKMBINDEX, TROPONINI in the last 72 hours. BNP: Invalid input(s):  POCBNP D-Dimer: No results for input(s): DDIMER in the last 72 hours. Hemoglobin A1C: No results for input(s): HGBA1C in the last 72 hours. Fasting Lipid Panel: No results for input(s): CHOL, HDL, LDLCALC, TRIG, CHOLHDL, LDLDIRECT in the last 72 hours. Thyroid Function Tests:  Recent Labs  08/24/16 1448  TSH 1.246   Anemia Panel: No results for input(s): VITAMINB12, FOLATE, FERRITIN, TIBC, IRON, RETICCTPCT in the last 72 hours.  RADIOLOGY: Dg Chest Port 1 View  Result Date: 08/25/2016 CLINICAL DATA:  Acute on chronic systolic congestive heart failure. Dilated cardiomyopathy. Acute kidney injury. EXAM: PORTABLE CHEST 1 VIEW COMPARISON:  None. FINDINGS: Marked cardiac enlargement noted. Diffuse interstitial infiltrates consistent with mild interstitial edema. Small right pleural effusion also seen with right basilar atelectasis. IMPRESSION: Mild congestive heart failure, with small right pleural effusion and right basilar atelectasis. Electronically Signed   By: Myles RosenthalJohn  Stahl M.D.   On: 08/25/2016 15:14    PHYSICAL EXAM General: NAD Neck: JVP 14 cm, no thyromegaly or thyroid nodule.  Lungs: Decreased breath sounds right base.  CV: Nondisplaced PMI.  Heart irregular S1/S2, no S3/S4, 3/6 crescendo-decrescendo murmur RUSB.  1+ ankle edema.   Abdomen: Soft, nontender, no hepatosplenomegaly, no distention.  Neurologic: Alert and oriented x 3.  Psych: Normal affect. Extremities: No clubbing or cyanosis.   TELEMETRY: Reviewed telemetry pt in atrial fibrillation in 90s  ASSESSMENT AND PLAN: 65 y/o ?with a h/o severe AS, LV dysfxn, HTN, noncompliance, thyroid nodules, pericardial effusion, cholelithiasis, and persistent AF on eliquis, who presentedto clinic on 08/23/16 with volume overload and was admitted for further work up of AS and CHF.  1. Acute on chronic systolic CHF: EF 20-35% in setting of severe aortic stenosis.  Cause uncertain, could be from the valvular disease itself but  cannot rule out underlying CAD.  Has not had prior cardiac cath.  This admission, he was initially hypotensive with SBP in 80s though BP has improved this afternoon in the 100s-110s range today.  He is also markedly volume overloaded.  I suspect low cardiac output. Yesterday, he refused CVL and PICC, agreeing to PICC today.  Lasix IV started yesterday with some diuresis and creatinine trending down now.  - PICC this morning (refusing CVL). After central access available, will follow CVP and co-ox.  If co-ox is low, will start milrinone 0.25 (on amiodarone gtt for rate control).   - Lasix 80 mg IV bid to continue.  Needs more diuresis.   - Can use norepinephrine if BP drops again.  2. Aortic stenosis: Severe AS.  Will need AVR, may be best suited for TAVR.  Will need to be more stable prior to AVR.  3. AKI on CKD stage III: Baseline creatinine seems to be around 1.7-1.8, rose to 3.42, 3.39 this morning.  Possible ATN in setting of hypotension. BP better now.  As above, will check co-ox and add inotropy if low.  Will use norepinephrine as needed to keep BP up, though current SBP stable in 100s.  4. Atrial fibrillation: Persistent, with RVR this admission.  He has been in atrial fibrillation at least since 6/17.  No prior DCCV.  Not sure how long prior to 6/17 he was in atrial fibrillation.  - Will use amiodarone gtt for now for rate control, HR now in 90s.  - Continue heparin gtt (was on apixaban at home).  5. Noncompliance: From prior notes, patient has a history of medical noncompliance.  He does not have a lot of trust in the medical system in general.    35 minutes critical care time.   Marca Ancona 08/26/2016 7:52 AM   Co-ox 47%, start milrinone 0.25 and follow HR closely.  Repeat co-ox in pm.   Marca Ancona 08/26/2016 11:27 AM

## 2016-08-27 DIAGNOSIS — I5020 Unspecified systolic (congestive) heart failure: Secondary | ICD-10-CM

## 2016-08-27 LAB — CBC
HCT: 38.2 % — ABNORMAL LOW (ref 39.0–52.0)
Hemoglobin: 12.1 g/dL — ABNORMAL LOW (ref 13.0–17.0)
MCH: 26.8 pg (ref 26.0–34.0)
MCHC: 31.7 g/dL (ref 30.0–36.0)
MCV: 84.7 fL (ref 78.0–100.0)
PLATELETS: 160 10*3/uL (ref 150–400)
RBC: 4.51 MIL/uL (ref 4.22–5.81)
RDW: 18.2 % — AB (ref 11.5–15.5)
WBC: 5.5 10*3/uL (ref 4.0–10.5)

## 2016-08-27 LAB — COMPREHENSIVE METABOLIC PANEL
ALT: 154 U/L — ABNORMAL HIGH (ref 17–63)
ANION GAP: 10 (ref 5–15)
AST: 108 U/L — ABNORMAL HIGH (ref 15–41)
Albumin: 2.7 g/dL — ABNORMAL LOW (ref 3.5–5.0)
Alkaline Phosphatase: 70 U/L (ref 38–126)
BILIRUBIN TOTAL: 2.8 mg/dL — AB (ref 0.3–1.2)
BUN: 41 mg/dL — AB (ref 6–20)
CHLORIDE: 95 mmol/L — AB (ref 101–111)
CO2: 33 mmol/L — ABNORMAL HIGH (ref 22–32)
Calcium: 8.5 mg/dL — ABNORMAL LOW (ref 8.9–10.3)
Creatinine, Ser: 2.21 mg/dL — ABNORMAL HIGH (ref 0.61–1.24)
GFR, EST AFRICAN AMERICAN: 34 mL/min — AB (ref >60)
GFR, EST NON AFRICAN AMERICAN: 29 mL/min — AB (ref >60)
Glucose, Bld: 121 mg/dL — ABNORMAL HIGH (ref 65–99)
POTASSIUM: 2.9 mmol/L — AB (ref 3.5–5.1)
Sodium: 138 mmol/L (ref 135–145)
TOTAL PROTEIN: 5.9 g/dL — AB (ref 6.5–8.1)

## 2016-08-27 LAB — BASIC METABOLIC PANEL
ANION GAP: 7 (ref 5–15)
ANION GAP: 8 (ref 5–15)
BUN: 34 mg/dL — AB (ref 6–20)
BUN: 34 mg/dL — AB (ref 6–20)
CHLORIDE: 95 mmol/L — AB (ref 101–111)
CHLORIDE: 96 mmol/L — AB (ref 101–111)
CO2: 33 mmol/L — ABNORMAL HIGH (ref 22–32)
CO2: 33 mmol/L — ABNORMAL HIGH (ref 22–32)
Calcium: 8.1 mg/dL — ABNORMAL LOW (ref 8.9–10.3)
Calcium: 8 mg/dL — ABNORMAL LOW (ref 8.9–10.3)
Creatinine, Ser: 2.02 mg/dL — ABNORMAL HIGH (ref 0.61–1.24)
Creatinine, Ser: 1.97 mg/dL — ABNORMAL HIGH (ref 0.61–1.24)
GFR calc Af Amer: 38 mL/min — ABNORMAL LOW (ref >60)
GFR calc non Af Amer: 32 mL/min — ABNORMAL LOW (ref >60)
GFR, EST AFRICAN AMERICAN: 39 mL/min — AB (ref >60)
GFR, EST NON AFRICAN AMERICAN: 33 mL/min — AB (ref >60)
Glucose, Bld: 206 mg/dL — ABNORMAL HIGH (ref 65–99)
Glucose, Bld: 175 mg/dL — ABNORMAL HIGH (ref 65–99)
POTASSIUM: 6.4 mmol/L — AB (ref 3.5–5.1)
POTASSIUM: 3.6 mmol/L (ref 3.5–5.1)
SODIUM: 135 mmol/L (ref 135–145)
SODIUM: 137 mmol/L (ref 135–145)

## 2016-08-27 LAB — APTT: APTT: 87 s — AB (ref 24–36)

## 2016-08-27 LAB — COOXEMETRY PANEL
CARBOXYHEMOGLOBIN: 1.8 % — AB (ref 0.5–1.5)
METHEMOGLOBIN: 0.6 % (ref 0.0–1.5)
O2 SAT: 56.3 %
TOTAL HEMOGLOBIN: 12 g/dL (ref 12.0–16.0)

## 2016-08-27 LAB — HEPARIN LEVEL (UNFRACTIONATED): Heparin Unfractionated: 1.02 IU/mL — ABNORMAL HIGH (ref 0.30–0.70)

## 2016-08-27 MED ORDER — POTASSIUM CHLORIDE 10 MEQ/50ML IV SOLN
10.0000 meq | INTRAVENOUS | Status: AC
  Administered 2016-08-27 (×6): 10 meq via INTRAVENOUS
  Filled 2016-08-27 (×6): qty 50

## 2016-08-27 MED ORDER — POTASSIUM CHLORIDE 20 MEQ PO PACK
40.0000 meq | PACK | Freq: Two times a day (BID) | ORAL | Status: DC
  Filled 2016-08-27: qty 2

## 2016-08-27 MED ORDER — POTASSIUM CHLORIDE 10 MEQ/50ML IV SOLN
10.0000 meq | INTRAVENOUS | Status: DC
  Filled 2016-08-27 (×3): qty 50

## 2016-08-27 MED ORDER — POTASSIUM CHLORIDE 10 MEQ/50ML IV SOLN: 10.0000 meq | INTRAVENOUS | Status: DC

## 2016-08-27 MED ORDER — POTASSIUM CHLORIDE 10 MEQ/50ML IV SOLN
10.0000 meq | INTRAVENOUS | Status: AC
  Administered 2016-08-27 (×3): 10 meq via INTRAVENOUS
  Filled 2016-08-27: qty 50

## 2016-08-27 MED ORDER — SPIRONOLACTONE 25 MG PO TABS
12.5000 mg | ORAL_TABLET | Freq: Every day | ORAL | Status: DC
  Administered 2016-08-27: 12.5 mg via ORAL
  Filled 2016-08-27: qty 1

## 2016-08-27 NOTE — Progress Notes (Signed)
Daily Progress Note   Patient Name: Lucas Munoz       Date: 08/27/2016 DOB: 07/16/1951  Age: 65 y.o. MRN#: [id number] Attending Physician: Lennette Biharihomas A Kelly, MD Primary Care Physician: Wilmer FloorAMPBELL, STEPHEN D., MD Admit Date: 08/24/2016  Reason for Consultation/Follow-up: Establishing goals of care and Psychosocial/spiritual support  Subjective: Patient reports feeling better since admission. He has been started on milrinone and his co-ox is now 3357. They're planning on a cardiac cath 08/28/2016. His repeat echo showed his ejection fraction had decreased to 15%. Discussed again his CODE STATUS and reviewed that in fact a partial code is what he would like to include CPR defibrillation pressors but no intubation. Patient wishes to designate his niece as healthcare power of attorney.  Length of Stay: 3  Current Medications: Scheduled Meds:  . famotidine  10 mg Oral BID  . furosemide  80 mg Intravenous BID  . potassium chloride  10 mEq Intravenous Q1 Hr x 6  . potassium chloride  10 mEq Intravenous Q1 Hr x 3  . sodium chloride flush  10-40 mL Intracatheter Q12H  . sodium chloride flush  3 mL Intravenous Q12H  . spironolactone  12.5 mg Oral Daily    Continuous Infusions: . amiodarone 30 mg/hr (08/27/16 1000)  . heparin 1,450 Units/hr (08/27/16 1000)  . milrinone 0.25 mcg/kg/min (08/27/16 1000)  . norepinephrine (LEVOPHED) Adult infusion      PRN Meds: sodium chloride, acetaminophen, ondansetron (ZOFRAN) IV, sodium chloride flush, sodium chloride flush  Physical Exam  Constitutional: He is oriented to person, place, and time. He appears well-developed and well-nourished.  HENT:  Head: Normocephalic and atraumatic.  Neck: Normal range of motion.  Cardiovascular: Normal rate and regular  rhythm.   On milrinone  Musculoskeletal: Normal range of motion.  Neurological: He is alert and oriented to person, place, and time.  Skin: Skin is warm and dry.  Nursing note and vitals reviewed.           Vital Signs: BP 111/77   Pulse 66   Temp 98.1 F (36.7 C) (Oral)   Resp (!) 23   Ht 5\' 8"  (1.727 m)   Wt 89.9 kg (198 lb 3.2 oz)   SpO2 98%   BMI 30.14 kg/m  SpO2: SpO2: 98 % O2 Device: O2 Device: Not Delivered O2 Flow Rate:  O2 Flow Rate (L/min): 4 L/min  Intake/output summary:  Intake/Output Summary (Last 24 hours) at 08/27/16 1039 Last data filed at 08/27/16 1024  Gross per 24 hour  Intake          1235.17 ml  Output             4075 ml  Net         -2839.83 ml   LBM: Last BM Date: 08/25/16 Baseline Weight: Weight: 89.6 kg (197 lb 8.5 oz) Most recent weight: Weight: 89.9 kg (198 lb 3.2 oz)       Palliative Assessment/Data:    Flowsheet Rows   Flowsheet Row Most Recent Value  Intake Tab  Referral Department  Hospitalist  Unit at Time of Referral  Cardiac/Telemetry Unit  Palliative Care Primary Diagnosis  Cardiac  Date Notified  08/25/16  Palliative Care Type  New Palliative care  Reason for referral  Clarify Goals of Care, Psychosocial or Spiritual support  Date of Admission  08/24/16  Date first seen by Palliative Care  08/25/16  # of days Palliative referral response time  0 Day(s)  # of days IP prior to Palliative referral  1  Clinical Assessment  Palliative Performance Scale Score  40%  Pain Max last 24 hours  0  Pain Min Last 24 hours  0  Dyspnea Max Last 24 Hours  0  Dyspnea Min Last 24 hours  0  Nausea Max Last 24 Hours  0  Nausea Min Last 24 Hours  0  Anxiety Max Last 24 Hours  0  Anxiety Min Last 24 Hours  0  Psychosocial & Spiritual Assessment  Palliative Care Outcomes  Patient/Family meeting held?  Yes  Who was at the meeting?  pt  Palliative Care follow-up planned  Yes, Facility      Patient Active Problem List   Diagnosis Date  Noted  . Palliative care encounter   . Acute on chronic systolic CHF (congestive heart failure) (HCC) 08/24/2016  . DCM (dilated cardiomyopathy) (HCC)   . Acute kidney injury superimposed on CKD (HCC)   . Gallstones 04/29/2016  . Systolic dysfunction with acute on chronic heart failure (HCC) 04/28/2016  . Pericardial effusion 04/28/2016  . Persistent atrial fibrillation (HCC) 04/09/2016  . Encounter for anticoagulation discussion and counseling 04/09/2016  . Bilateral lower extremity edema 04/09/2016  . Hepatomegaly 04/09/2016  . Severe aortic stenosis 04/09/2016  . GERD (gastroesophageal reflux disease) 04/09/2016  . Abdominal pain 04/09/2016  . Shortness of breath 03/23/2016  . Essential hypertension 03/23/2016  . Fluid retention 03/23/2016    Palliative Care Assessment & Plan   Patient Profile: 65 y/o ?with a h/o severe AS, LV dysfxn, HTN, noncompliance, thyroid nodules, pericardial effusion, cholelithiasis, and persistent AF on eliquis, who presentedto clinic on 08/23/16 with volume overload and was admitted for further work up of AS and CHF.  Recommendations/Plan:  Continue with partial code with CPR, defibrillation, pressors, but no intubation  Consult placed to spiritual care with his permission to finalize healthcare power of attorney for his niece.  Goals of Care and Additional Recommendations:  Limitations on Scope of Treatment: Full Scope Treatment  Code Status:    Code Status Orders        Start     Ordered   08/25/16 1134  Limited resuscitation (code)  Continuous    Question Answer Comment  In the event of cardiac or respiratory ARREST: Initiate Code Blue, Call Rapid Response Yes   In the event of  cardiac or respiratory ARREST: Perform CPR Yes   In the event of cardiac or respiratory ARREST: Perform Intubation/Mechanical Ventilation No   In the event of cardiac or respiratory ARREST: Use NIPPV/BiPAp only if indicated Yes   In the event of cardiac or  respiratory ARREST: Administer ACLS medications if indicated Yes   In the event of cardiac or respiratory ARREST: Perform Defibrillation or Cardioversion if indicated Yes      08/25/16 1133    Code Status History    Date Active Date Inactive Code Status Order ID Comments User Context   08/24/2016  1:13 PM 08/25/2016 11:33 AM Full Code [id number]  Ok Anis, NP Inpatient       Prognosis:   < 6 months in the setting of severe aortic stenosis, chronic kidney disease stage 3-4, cardiomyopathy with global hypokinesis and ejection fraction of 15%, chronic atrial fibrillation, cirrhosis of the liver  Discharge Planning:  To Be Determined   Thank you for allowing the Palliative Medicine Team to assist in the care of this patient.   Time In: 0800 Time Out: 0825 Total Time 25 min Prolonged Time Billed  no       Greater than 50%  of this time was spent counseling and coordinating care related to the above assessment and plan.  Irean Hong, NP  Please contact Palliative Medicine Team phone at [phone] for questions and concerns.

## 2016-08-27 NOTE — Progress Notes (Signed)
ANTICOAGULATION CONSULT NOTE - Follow-up Consult  Pharmacy Consult for Heparin Indication: atrial fibrillation  Patient Measurements: Height: 5\' 8"  (172.7 cm) Weight: 198 lb 3.2 oz (89.9 kg) IBW/kg (Calculated) : 68.4 Heparin Dosing Weight: 86.8kg  Vital Signs: Temp: 98.1 F (36.7 C) (10/15 0800) Temp Source: Oral (10/15 0800) BP: 99/69 (10/15 0800) Pulse Rate: 45 (10/15 0800)  Labs:  Recent Labs  08/24/16 1448 08/25/16 0412 08/26/16 0837 08/26/16 0933 08/26/16 2046 08/27/16 0500  HGB 14.1  --  13.1  --   --  12.1*  HCT 45.3  --  41.9  --   --  38.2*  PLT 176  --  154  --   --  160  APTT  --   --  72*  --  62* 87*  HEPARINUNFRC  --   --   --  2.14*  --  1.02*  CREATININE 3.42* 3.39* 2.64*  --   --  2.21*    Estimated Creatinine Clearance: 35.3 mL/min (by C-G formula based on SCr of 2.21 mg/dL (H)).   Assessment: 65yom on apixaban for afib, now transitioning to heparin. Last apixaban dose 10/13 at 1108. Heparin level still falsely elevated so will use APTT to dose heparin until they they correlate. APTT 87 - at goal. No issues reported by RN.  Goal of Therapy:  APTT 66-102 seconds Heparin level 0.3-0.7 units/ml Monitor platelets by anticoagulation protocol: Yes   Plan:  1) Continue heparin at 1450 units/hr 2) Daily heparin level, APTT, CBC   Sherron Monday, PharmD Clinical Pharmacy Resident Pager: [phone] 08/27/16 8:32 AM

## 2016-08-27 NOTE — Progress Notes (Signed)
Patient ID: Lucas Munoz, male   DOB: 1951/03/30, 65 y.o.   MRN: [id number]    SUBJECTIVE: PICC placed yesterday, initial co-ox 47%.  He was started on milrinone, co-ox 56% this morning.  Creatinine down to 2.2.  He diuresed well, weight down 3 lbs.  Breathing better.  K low this morning, says he cannot take po K.  Echo (10/14): EF 15%, severe AS, moderately decreased RV systolic function, PASP 65 mmHg, moderate to large pericardial effusion without tamponade.   Scheduled Meds: . famotidine  10 mg Oral BID  . furosemide  80 mg Intravenous BID  . potassium chloride  10 mEq Intravenous Q1 Hr x 6  . potassium chloride  10 mEq Intravenous Q1 Hr x 3  . sodium chloride flush  10-40 mL Intracatheter Q12H  . sodium chloride flush  3 mL Intravenous Q12H  . spironolactone  12.5 mg Oral Daily   Continuous Infusions: . amiodarone 30 mg/hr (08/27/16 0500)  . heparin 1,450 Units/hr (08/26/16 2356)  . milrinone 0.25 mcg/kg/min (08/26/16 2300)  . norepinephrine (LEVOPHED) Adult infusion     PRN Meds:.sodium chloride, acetaminophen, ondansetron (ZOFRAN) IV, sodium chloride flush, sodium chloride flush    Vitals:   08/27/16 0400 08/27/16 0500 08/27/16 0600 08/27/16 0700  BP: 106/88 101/88 110/83 107/79  Pulse: (!) 137 (!) 54 91 84  Resp: (!) 22 17 15 19   Temp: 98.8 F (37.1 C)     TempSrc: Oral     SpO2: 94% 94% 93% 94%  Weight: 198 lb 3.2 oz (89.9 kg)     Height:        Intake/Output Summary (Last 24 hours) at 08/27/16 0723 Last data filed at 08/27/16 0700  Gross per 24 hour  Intake          1126.67 ml  Output             3550 ml  Net         -2423.33 ml    LABS: Basic Metabolic Panel:  Recent Labs  40/98/11 0837 08/27/16 0500  NA 137 138  K 3.6 2.9*  CL 97* 95*  CO2 28 33*  GLUCOSE 121* 121*  BUN 49* 41*  CREATININE 2.64* 2.21*  CALCIUM 8.8* 8.5*  MG 2.3  --    Liver Function Tests:  Recent Labs  08/24/16 1448 08/27/16 0500  AST 204* 108*  ALT 146* 154*  ALKPHOS 82  70  BILITOT 6.1* 2.8*  PROT 6.4* 5.9*  ALBUMIN 3.1* 2.7*   No results for input(s): LIPASE, AMYLASE in the last 72 hours. CBC:  Recent Labs  08/24/16 1448 08/26/16 0837 08/27/16 0500  WBC 9.1 5.8 5.5  NEUTROABS 7.1  --   --   HGB 14.1 13.1 12.1*  HCT 45.3 41.9 38.2*  MCV 86.5 85.9 84.7  PLT 176 154 160   Cardiac Enzymes: No results for input(s): CKTOTAL, CKMB, CKMBINDEX, TROPONINI in the last 72 hours. BNP: Invalid input(s): POCBNP D-Dimer: No results for input(s): DDIMER in the last 72 hours. Hemoglobin A1C: No results for input(s): HGBA1C in the last 72 hours. Fasting Lipid Panel: No results for input(s): CHOL, HDL, LDLCALC, TRIG, CHOLHDL, LDLDIRECT in the last 72 hours. Thyroid Function Tests:  Recent Labs  08/24/16 1448  TSH 1.246   Anemia Panel: No results for input(s): VITAMINB12, FOLATE, FERRITIN, TIBC, IRON, RETICCTPCT in the last 72 hours.  RADIOLOGY: Dg Chest Port 1 View  Result Date: 08/26/2016 CLINICAL DATA:  65 year old male with a history of  shortness of breath EXAM: PORTABLE CHEST 1 VIEW COMPARISON:  08/25/2016, CT chest 05/01/2016 FINDINGS: Compare to the prior plain film there is unchanged size of the cardiac silhouette. Calcifications of the aortic arch. Interval placement of right upper extremity PICC with the tip appearing to terminate at the superior cavoatrial junction. Increasing opacity at the right base with thickening of the minor fissure, obscuration the right hemidiaphragm and right heart border. Interlobular septal thickening. IMPRESSION: Evidence of CHF with increasing right-sided pleural effusion/atelectasis. Unchanged cardiac silhouette in this patient with a history of pericardial effusion imaged on prior CT 05/01/2016. Interval placement of right upper extremity PICC. Aortic atherosclerosis. Signed, Yvone Neu. Loreta Ave, DO [facility] Electronically Signed   By: Gilmer Mor D.O.    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 Dilated cardiomyopathy. Acute kidney injury. EXAM: PORTABLE CHEST 1 VIEW COMPARISON:  None. FINDINGS: Marked cardiac enlargement noted. Diffuse interstitial infiltrates consistent with mild interstitial edema. Small right pleural effusion also seen with right basilar atelectasis. IMPRESSION: Mild congestive heart failure, with small right pleural effusion and right basilar atelectasis. Electronically Signed   By: Myles Rosenthal M.D.   On: 08/25/2016 15:14    PHYSICAL EXAM General: NAD Neck: JVP 14 cm, no thyromegaly or thyroid nodule.  Lungs: Decreased breath sounds right base.  CV: Nondisplaced PMI.  Heart irregular S1/S2, no S3/S4, 3/6 crescendo-decrescendo murmur RUSB.  1+ ankle edema.   Abdomen: Soft, nontender, no hepatosplenomegaly, no distention.  Neurologic: Alert and oriented x 3.  Psych: Normal affect. Extremities: No clubbing or cyanosis.   TELEMETRY: Reviewed telemetry pt in atrial fibrillation in 90s-100s  ASSESSMENT AND PLAN: 65 y/o ?with a h/o severe AS, LV dysfxn, HTN, noncompliance, thyroid nodules, pericardial effusion, cholelithiasis, and persistent AF on eliquis, who presentedto clinic on 08/23/16 with volume overload and was admitted for further work up of AS and CHF.  1. Acute on chronic systolic CHF: EF 84-16% in setting of severe aortic stenosis.  Cause uncertain, could be from the valvular disease itself but cannot rule out underlying CAD.  Has not had prior cardiac cath.  This admission, he was initially hypotensive with SBP in 80s though BP has improved to 100s-110s range.  He is also markedly volume overloaded. PICC was placed, low output confirmed by co-ox 47%.  Milrinone 0.25 started, co-ox 56% this morning.  Creatinine now lower (2.2) and good UOP yesterday.   Weight down 3 lbs, CVP remains 18.  - Continue milrinone 0.25.   -  Lasix 80 mg IV bid to continue.  Needs more diuresis.   - Low K => he will not take po K, says it upsets stomach too much.  Will replace IV and will add spironolactone 12.5 daily.   - Will need eventual coronary angiography prior to AVR, would like to see creatinine better. 2. Aortic stenosis: Severe AS.  Will need AVR, likely best suited for TAVR.  He will need to be medically optimized prior to this.  3. AKI on CKD stage III: Baseline creatinine seems to be around 1.7-1.8, rose to 3.42, now down to 2.2.  Possible ATN in setting of hypotension. BP better now, on milrinone to increase inotropy.  4. Atrial fibrillation: Persistent, with RVR this admission.  He has been in atrial fibrillation at least since 6/17.  No prior DCCV.  Not sure how long prior to 6/17 he  was in atrial fibrillation.  - Will use amiodarone gtt for now for rate control, HR now in 90s-100s with milrinone running.  - Continue heparin gtt (was on apixaban at home).  5. Noncompliance: From prior notes, patient has a history of medical noncompliance.  He does not have a lot of trust in the medical system in general.    35 minutes critical care time.   Marca AnconaDalton Jashun Puertas 08/27/2016 7:23 AM

## 2016-08-27 NOTE — Progress Notes (Addendum)
CRITICAL VALUE ALERT  Critical value received:  k 6.4  Date of notification:  08/27/16  Time of notification:  1650  Critical value read back: yes  Nurse who received alert:  Floy Sabina  MD notified (1st page):  [place]  Time of first page:  1700  MD notified (2nd page): [place]  Time of second page:1705  Responding MD:  Mayford Knife  Time MD responded:  [phone]

## 2016-08-28 LAB — COOXEMETRY PANEL
Carboxyhemoglobin: 1.1 % (ref 0.5–1.5)
Carboxyhemoglobin: 1.4 % (ref 0.5–1.5)
METHEMOGLOBIN: 0.8 % (ref 0.0–1.5)
METHEMOGLOBIN: 0.8 % (ref 0.0–1.5)
O2 SAT: 47.2 %
O2 Saturation: 57.7 %
TOTAL HEMOGLOBIN: 12.8 g/dL (ref 12.0–16.0)
TOTAL HEMOGLOBIN: 12.4 g/dL (ref 12.0–16.0)

## 2016-08-28 LAB — BASIC METABOLIC PANEL
Anion gap: 9 (ref 5–15)
Anion gap: 10 (ref 5–15)
BUN: 31 mg/dL — AB (ref 6–20)
BUN: 28 mg/dL — AB (ref 6–20)
CHLORIDE: 96 mmol/L — AB (ref 101–111)
CHLORIDE: 96 mmol/L — AB (ref 101–111)
CO2: 34 mmol/L — AB (ref 22–32)
CO2: 33 mmol/L — ABNORMAL HIGH (ref 22–32)
CREATININE: 1.81 mg/dL — AB (ref 0.61–1.24)
CREATININE: 1.82 mg/dL — AB (ref 0.61–1.24)
Calcium: 8.7 mg/dL — ABNORMAL LOW (ref 8.9–10.3)
Calcium: 8.7 mg/dL — ABNORMAL LOW (ref 8.9–10.3)
GFR calc Af Amer: 43 mL/min — ABNORMAL LOW (ref >60)
GFR calc Af Amer: 43 mL/min — ABNORMAL LOW (ref >60)
GFR calc non Af Amer: 37 mL/min — ABNORMAL LOW (ref >60)
GFR, EST NON AFRICAN AMERICAN: 37 mL/min — AB (ref >60)
GLUCOSE: 120 mg/dL — AB (ref 65–99)
GLUCOSE: 160 mg/dL — AB (ref 65–99)
POTASSIUM: 3.3 mmol/L — AB (ref 3.5–5.1)
POTASSIUM: 3.3 mmol/L — AB (ref 3.5–5.1)
SODIUM: 139 mmol/L (ref 135–145)
SODIUM: 139 mmol/L (ref 135–145)

## 2016-08-28 LAB — CBC
HEMATOCRIT: 39.7 % (ref 39.0–52.0)
HEMOGLOBIN: 12.2 g/dL — AB (ref 13.0–17.0)
MCH: 26.5 pg (ref 26.0–34.0)
MCHC: 30.7 g/dL (ref 30.0–36.0)
MCV: 86.3 fL (ref 78.0–100.0)
Platelets: 129 10*3/uL — ABNORMAL LOW (ref 150–400)
RBC: 4.6 MIL/uL (ref 4.22–5.81)
RDW: 18.2 % — AB (ref 11.5–15.5)
WBC: 4.8 10*3/uL (ref 4.0–10.5)

## 2016-08-28 LAB — HEPARIN LEVEL (UNFRACTIONATED): HEPARIN UNFRACTIONATED: 0.82 [IU]/mL — AB (ref 0.30–0.70)

## 2016-08-28 LAB — APTT: aPTT: 92 seconds — ABNORMAL HIGH (ref 24–36)

## 2016-08-28 MED ORDER — ACETAZOLAMIDE 250 MG PO TABS
250.0000 mg | ORAL_TABLET | Freq: Two times a day (BID) | ORAL | Status: AC
  Administered 2016-08-28 – 2016-08-29 (×4): 250 mg via ORAL
  Filled 2016-08-28 (×4): qty 1

## 2016-08-28 MED ORDER — POTASSIUM CHLORIDE 10 MEQ/50ML IV SOLN
10.0000 meq | INTRAVENOUS | Status: AC
  Administered 2016-08-28 (×3): 10 meq via INTRAVENOUS
  Filled 2016-08-28: qty 50

## 2016-08-28 MED ORDER — SPIRONOLACTONE 25 MG PO TABS
25.0000 mg | ORAL_TABLET | Freq: Every day | ORAL | Status: DC
  Administered 2016-08-28 – 2016-09-06 (×10): 25 mg via ORAL
  Filled 2016-08-28 (×11): qty 1

## 2016-08-28 MED ORDER — POTASSIUM CHLORIDE 10 MEQ/50ML IV SOLN
10.0000 meq | INTRAVENOUS | Status: AC
  Administered 2016-08-28 (×3): 10 meq via INTRAVENOUS
  Filled 2016-08-28 (×2): qty 50

## 2016-08-28 NOTE — Progress Notes (Signed)
CARDIAC REHAB PHASE I   PRE:  Rate/Rhythm: 110 afib    BP: sitting 96/69    SaO2:   MODE:  Ambulation: 350 ft   POST:  Rate/Rhythm: 148 afib    BP: sitting 115/88     SaO2: 92-93 RA  Pt eager to walk. Fairly steady. He was very pleased to see he could walk without SOB. He sts PTA he couldn't walk more than 10 ft without resting. HR up to 148 afib, asx. Return to recliner. Will f/u. [phone]   Harriet Masson CES, ACSM 08/28/2016 2:36 PM

## 2016-08-28 NOTE — Progress Notes (Signed)
   08/28/16 1100  Clinical Encounter Type  Visited With Patient  Visit Type Initial  Referral From Patient (Advanced Directive)  Stress Factors  Patient Stress Factors Health changes;Family relationships   - Chaplain facilitated completion of Advanced Directive (HCPOA & LW). - Unpacked pt.'s concerns regarding family structures.  - Complex family dynamic.  - Personal/Physical distance from children may inform/comlicate goals-of-care.  - Rev. Chaplain Kipp Brood MDiv ThM

## 2016-08-28 NOTE — Progress Notes (Signed)
ANTICOAGULATION CONSULT NOTE - Follow-up Consult  Pharmacy Consult for Heparin Indication: atrial fibrillation   Assessment: 65yom on apixaban for afib, now transitioning to heparin. Last apixaban dose 10/13 am. Heparin level still falsely elevated so will use APTT to dose heparin until they they correlate. APTT 92 - at goal. No issues reported by RN.  Goal of Therapy:  APTT 66-102 seconds Heparin level 0.3-0.7 units/ml Monitor platelets by anticoagulation protocol: Yes   Plan:  1) Continue heparin at 1450 units/hr 2) Daily heparin level, APTT, CBC  Patient Measurements: Height: 5\' 8"  (172.7 cm) Weight: 197 lb 1.6 oz (89.4 kg) IBW/kg (Calculated) : 68.4 Heparin Dosing Weight: 86.8kg  Vital Signs: Temp: 98.1 F (36.7 C) (10/15 2300) Temp Source: Oral (10/15 2300) BP: 121/82 (10/16 0600) Pulse Rate: 35 (10/16 0600)  Labs:  Recent Labs  08/26/16 0837 08/26/16 0933 08/26/16 2046 08/27/16 0500 08/27/16 1530 08/27/16 1726 08/28/16 0455  HGB 13.1  --   --  12.1*  --   --  12.2*  HCT 41.9  --   --  38.2*  --   --  39.7  PLT 154  --   --  160  --   --  129*  APTT 72*  --  62* 87*  --   --  92*  HEPARINUNFRC  --  2.14*  --  1.02*  --   --  0.82*  CREATININE 2.64*  --   --  2.21* 2.02* 1.97* 1.81*    Estimated Creatinine Clearance: 43 mL/min (by C-G formula based on SCr of 1.81 mg/dL (H)).  Sheppard Coil PharmD., BCPS Clinical Pharmacist Pager [phone] 08/28/2016 7:29 AM

## 2016-08-28 NOTE — Progress Notes (Signed)
Patient ID: Lucas Munoz, male   DOB: 03/04/1951, 65 y.o.   MRN: [id number]    SUBJECTIVE: PICC placed yesterday, initial co-ox 47%.  He was started on milrinone, co-ox 58% this morning.  Creatinine down to 1.8.   Diuresis slowing.     Denies SOB, feels better overall.   Echo (10/14): EF 15%, severe AS, moderately decreased RV systolic function, PASP 65 mmHg, moderate to large pericardial effusion without tamponade.   Scheduled Meds: . famotidine  10 mg Oral BID  . furosemide  80 mg Intravenous BID  . sodium chloride flush  10-40 mL Intracatheter Q12H  . sodium chloride flush  3 mL Intravenous Q12H  . spironolactone  12.5 mg Oral Daily   Continuous Infusions: . amiodarone 30 mg/hr (08/28/16 0426)  . heparin 1,450 Units/hr (08/28/16 0426)  . milrinone 0.25 mcg/kg/min (08/28/16 0426)  . norepinephrine (LEVOPHED) Adult infusion     PRN Meds:.sodium chloride, acetaminophen, ondansetron (ZOFRAN) IV, sodium chloride flush, sodium chloride flush    Vitals:   08/28/16 0300 08/28/16 0434 08/28/16 0500 08/28/16 0600  BP: 107/74  92/72 121/82  Pulse: 62  (!) 51 (!) 35  Resp: 15  18 20   Temp:      TempSrc:      SpO2: (!) 89%  90% 96%  Weight:  197 lb 1.6 oz (89.4 kg)    Height:        Intake/Output Summary (Last 24 hours) at 08/28/16 0712 Last data filed at 08/28/16 0600  Gross per 24 hour  Intake           2047.3 ml  Output             3450 ml  Net          -1402.7 ml    LABS: Basic Metabolic Panel:  Recent Labs  40/98/1110/14/17 0837  08/27/16 1726 08/28/16 0455  NA 137  < > 137 139  K 3.6  < > 3.6 3.3*  CL 97*  < > 96* 96*  CO2 28  < > 33* 34*  GLUCOSE 121*  < > 175* 120*  BUN 49*  < > 34* 31*  CREATININE 2.64*  < > 1.97* 1.81*  CALCIUM 8.8*  < > 8.0* 8.7*  MG 2.3  --   --   --   < > = values in this interval not displayed. Liver Function Tests:  Recent Labs  08/27/16 0500  AST 108*  ALT 154*  ALKPHOS 70  BILITOT 2.8*  PROT 5.9*  ALBUMIN 2.7*   No results for  input(s): LIPASE, AMYLASE in the last 72 hours. CBC:  Recent Labs  08/27/16 0500 08/28/16 0455  WBC 5.5 4.8  HGB 12.1* 12.2*  HCT 38.2* 39.7  MCV 84.7 86.3  PLT 160 129*   Cardiac Enzymes: No results for input(s): CKTOTAL, CKMB, CKMBINDEX, TROPONINI in the last 72 hours. BNP: Invalid input(s): POCBNP D-Dimer: No results for input(s): DDIMER in the last 72 hours. Hemoglobin A1C: No results for input(s): HGBA1C in the last 72 hours. Fasting Lipid Panel: No results for input(s): CHOL, HDL, LDLCALC, TRIG, CHOLHDL, LDLDIRECT in the last 72 hours. Thyroid Function Tests: No results for input(s): TSH, T4TOTAL, T3FREE, THYROIDAB in the last 72 hours.  Invalid input(s): FREET3 Anemia Panel: No results for input(s): VITAMINB12, FOLATE, FERRITIN, TIBC, IRON, RETICCTPCT in the last 72 hours.  RADIOLOGY: Dg Chest Port 1 View  Result Date: 08/26/2016 CLINICAL DATA:  65 year old male with a history of shortness of  breath EXAM: PORTABLE CHEST 1 VIEW COMPARISON:  08/25/2016, CT chest 05/01/2016 FINDINGS: Compare to the prior plain film there is unchanged size of the cardiac silhouette. Calcifications of the aortic arch. Interval placement of right upper extremity PICC with the tip appearing to terminate at the superior cavoatrial junction. Increasing opacity at the right base with thickening of the minor fissure, obscuration the right hemidiaphragm and right heart border. Interlobular septal thickening. IMPRESSION: Evidence of CHF with increasing right-sided pleural effusion/atelectasis. Unchanged cardiac silhouette in this patient with a history of pericardial effusion imaged on prior CT 05/01/2016. Interval placement of right upper extremity PICC. Aortic atherosclerosis. Signed, Yvone Neu. Loreta Ave, DO [facility] Electronically Signed   By: Gilmer Mor D.O.   On: 08/26/2016 09:37   Dg Chest Port 1 View  Result Date: 08/25/2016 CLINICAL  DATA:  Acute on chronic systolic congestive heart failure. Dilated cardiomyopathy. Acute kidney injury. EXAM: PORTABLE CHEST 1 VIEW COMPARISON:  None. FINDINGS: Marked cardiac enlargement noted. Diffuse interstitial infiltrates consistent with mild interstitial edema. Small right pleural effusion also seen with right basilar atelectasis. IMPRESSION: Mild congestive heart failure, with small right pleural effusion and right basilar atelectasis. Electronically Signed   By: Myles Rosenthal M.D.   On: 08/25/2016 15:14    PHYSICAL EXAM CVP 14-15 General: NAD Neck: JVP 12 cm, no thyromegaly or thyroid nodule.  Lungs: Decreased breath sounds right base.  CV: Nondisplaced PMI.  Heart irregular S1/S2, no S3/S4, 3/6 crescendo-decrescendo murmur RUSB.  Trace-1+ ankle edema.   Abdomen: Soft, nontender, no hepatosplenomegaly, no distention.  Neurologic: Alert and oriented x 3.  Psych: Normal affect. Extremities: No clubbing or cyanosis. LUE PICC  TELEMETRY: Reviewed telemetry pt in atrial fibrillation in 90s-100s  ASSESSMENT AND PLAN: 65 y/o ?with a h/o severe AS, LV dysfxn, HTN, noncompliance, thyroid nodules, pericardial effusion, cholelithiasis, and persistent AF on eliquis, who presentedto clinic on 08/23/16 with volume overload and was admitted for further work up of AS and CHF.  1. Acute on chronic systolic CHF: EF 16-10% in setting of severe aortic stenosis.  Cause uncertain, could be from the valvular disease itself but cannot rule out underlying CAD.  Has not had prior cardiac cath.  This admission, he was initially hypotensive with SBP in 80s though BP has improved to 100s-110s range.  He is also markedly volume overloaded. PICC was placed, low output confirmed by co-ox 47%.  Milrinone 0.25 started, co-ox 58% this morning.  Creatinine now lower (1.81) and UOP not a good as previous day. Weight down 1 lb, CVP remains 15.  - CO2 trending up. Add Diamox 250 twice daily, hopefully will help diuresis.   -  Continue milrinone 0.25.   - Lasix 80 mg IV bid to continue.  Needs more diuresis.   - K 3.3. Give 3 runs of k. Increase spiro 25 mg daily.   - Will need eventual coronary angiography prior to AVR, possibly tomorrow.  2. Aortic stenosis: Severe AS.  Will need AVR, likely best suited for TAVR.  He will need to be medically optimized prior to this.  3. AKI on CKD stage III: Baseline creatinine seems to be around 1.7-1.8, rose to 3.42, now down to 1.81.  Possible ATN in setting of hypotension. BP better now, on milrinone to increase inotropy.  4. Atrial fibrillation: Persistent, with RVR this admission.  He has been in atrial fibrillation at least since 6/17.  No prior DCCV.  Not sure how long prior to  6/17 he was in atrial fibrillation.  - Will use amiodarone gtt for now for rate control, HR now in 90s-100s with milrinone running.  - Continue heparin gtt (was on apixaban at home).  5. Noncompliance: From prior notes, patient has a history of medical noncompliance.  He does not have a lot of trust in the medical system in general.    Amy Clegg  NP-C  08/28/2016 7:12 AM   Patient seen with NP, agree with the above note.  He is stable today, no dyspnea.  Feeling better overall.  Co-ox acceptable on current milrinone, creatinine continues to trend down, now appears to be near his baseline.  - Continue Lasix IV today, add Diamox.  - Possible RHC/LHC tomorrow, will keep NPO.   Marca Ancona 08/28/2016 7:53 AM

## 2016-08-29 ENCOUNTER — Encounter (HOSPITAL_COMMUNITY)
Admission: AD | Disposition: A | Discharge status: Home Health | Payer: Self-pay | Source: Ambulatory Visit | Attending: Cardiovascular Disease

## 2016-08-29 ENCOUNTER — Ambulatory Visit (HOSPITAL_COMMUNITY)
Admission: RE | Admit: 2016-08-29 | Payer: Commercial Managed Care - HMO | Source: Ambulatory Visit | Admitting: Cardiology

## 2016-08-29 DIAGNOSIS — I2510 Atherosclerotic heart disease of native coronary artery without angina pectoris: Secondary | ICD-10-CM

## 2016-08-29 DIAGNOSIS — I350 Nonrheumatic aortic (valve) stenosis: Secondary | ICD-10-CM

## 2016-08-29 HISTORY — PX: CARDIAC CATHETERIZATION: SHX172

## 2016-08-29 LAB — POCT I-STAT 3, VENOUS BLOOD GAS (G3P V)
ACID-BASE EXCESS: 4 mmol/L — AB (ref 0.0–2.0)
Acid-Base Excess: 4 mmol/L — ABNORMAL HIGH (ref 0.0–2.0)
BICARBONATE: 31.1 mmol/L — AB (ref 20.0–28.0)
Bicarbonate: 30.5 mmol/L — ABNORMAL HIGH (ref 20.0–28.0)
O2 Saturation: 47 %
O2 Saturation: 46 %
PH VEN: 7.368 (ref 7.250–7.430)
PH VEN: 7.38 (ref 7.250–7.430)
PO2 VEN: 26 mmHg — AB (ref 32.0–45.0)
TCO2: 33 mmol/L (ref 0–100)
TCO2: 32 mmol/L (ref 0–100)
pCO2, Ven: 54.1 mmHg (ref 44.0–60.0)
pCO2, Ven: 51.6 mmHg (ref 44.0–60.0)
pO2, Ven: 27 mmHg — CL (ref 32.0–45.0)

## 2016-08-29 LAB — BASIC METABOLIC PANEL
ANION GAP: 8 (ref 5–15)
BUN: 25 mg/dL — AB (ref 6–20)
CO2: 32 mmol/L (ref 22–32)
Calcium: 8.6 mg/dL — ABNORMAL LOW (ref 8.9–10.3)
Chloride: 97 mmol/L — ABNORMAL LOW (ref 101–111)
Creatinine, Ser: 1.66 mg/dL — ABNORMAL HIGH (ref 0.61–1.24)
GFR, EST AFRICAN AMERICAN: 48 mL/min — AB (ref >60)
GFR, EST NON AFRICAN AMERICAN: 41 mL/min — AB (ref >60)
Glucose, Bld: 114 mg/dL — ABNORMAL HIGH (ref 65–99)
POTASSIUM: 3.4 mmol/L — AB (ref 3.5–5.1)
SODIUM: 137 mmol/L (ref 135–145)

## 2016-08-29 LAB — COOXEMETRY PANEL
CARBOXYHEMOGLOBIN: 1.5 % (ref 0.5–1.5)
Methemoglobin: 0.9 % (ref 0.0–1.5)
O2 SAT: 62 %
TOTAL HEMOGLOBIN: 11.5 g/dL — AB (ref 12.0–16.0)

## 2016-08-29 LAB — CBC
HCT: 37.7 % — ABNORMAL LOW (ref 39.0–52.0)
Hemoglobin: 11.4 g/dL — ABNORMAL LOW (ref 13.0–17.0)
MCH: 26.3 pg (ref 26.0–34.0)
MCHC: 30.2 g/dL (ref 30.0–36.0)
MCV: 86.9 fL (ref 78.0–100.0)
Platelets: 141 10*3/uL — ABNORMAL LOW (ref 150–400)
RBC: 4.34 MIL/uL (ref 4.22–5.81)
RDW: 18.2 % — ABNORMAL HIGH (ref 11.5–15.5)
WBC: 5.5 10*3/uL (ref 4.0–10.5)

## 2016-08-29 LAB — HEPARIN LEVEL (UNFRACTIONATED): HEPARIN UNFRACTIONATED: 0.61 [IU]/mL (ref 0.30–0.70)

## 2016-08-29 LAB — PROTIME-INR
INR: 1.27
Prothrombin Time: 16 seconds — ABNORMAL HIGH (ref 11.4–15.2)

## 2016-08-29 SURGERY — RIGHT/LEFT HEART CATH AND CORONARY ANGIOGRAPHY

## 2016-08-29 MED ORDER — SODIUM CHLORIDE 0.9 % IV SOLN
250.0000 mL | INTRAVENOUS | Status: DC | PRN
Start: 2016-08-29 — End: 2016-08-29

## 2016-08-29 MED ORDER — HEPARIN (PORCINE) IN NACL 100-0.45 UNIT/ML-% IJ SOLN
1600.0000 [IU]/h | INTRAMUSCULAR | Status: DC
  Administered 2016-08-29 – 2016-09-03 (×8): 1450 [IU]/h via INTRAVENOUS
  Administered 2016-09-04: 1600 [IU]/h via INTRAVENOUS
  Administered 2016-09-04: 1450 [IU]/h via INTRAVENOUS
  Filled 2016-08-29 (×9): qty 250

## 2016-08-29 MED ORDER — HEPARIN SODIUM (PORCINE) 1000 UNIT/ML IJ SOLN
INTRAMUSCULAR | Status: AC
  Filled 2016-08-29: qty 1

## 2016-08-29 MED ORDER — SODIUM CHLORIDE 0.9% FLUSH: 3.0000 mL | Freq: Two times a day (BID) | INTRAVENOUS | Status: DC

## 2016-08-29 MED ORDER — SODIUM CHLORIDE 0.9% FLUSH: 3.0000 mL | INTRAVENOUS | Status: DC | PRN

## 2016-08-29 MED ORDER — SODIUM CHLORIDE 0.9 % IV SOLN
250.0000 mL | INTRAVENOUS | Status: DC | PRN
  Administered 2016-09-05 – 2016-09-08 (×2): 250 mL via INTRAVENOUS

## 2016-08-29 MED ORDER — IOPAMIDOL (ISOVUE-370) INJECTION 76%
INTRAVENOUS | Status: AC
  Filled 2016-08-29: qty 100

## 2016-08-29 MED ORDER — VERAPAMIL HCL 2.5 MG/ML IV SOLN
INTRAVENOUS | Status: AC
  Filled 2016-08-29: qty 2

## 2016-08-29 MED ORDER — FENTANYL CITRATE (PF) 100 MCG/2ML IJ SOLN
INTRAMUSCULAR | Status: AC
  Filled 2016-08-29: qty 2

## 2016-08-29 MED ORDER — BISACODYL 5 MG PO TBEC: 5.0000 mg | DELAYED_RELEASE_TABLET | Freq: Every day | ORAL | Status: DC | PRN

## 2016-08-29 MED ORDER — LIDOCAINE HCL (PF) 1 % IJ SOLN
INTRAMUSCULAR | Status: AC
  Filled 2016-08-29: qty 30

## 2016-08-29 MED ORDER — DOCUSATE SODIUM 100 MG PO CAPS: 100.0000 mg | ORAL_CAPSULE | Freq: Two times a day (BID) | ORAL | Status: DC

## 2016-08-29 MED ORDER — HEPARIN (PORCINE) IN NACL 2-0.9 UNIT/ML-% IJ SOLN
INTRAMUSCULAR | Status: DC | PRN
  Administered 2016-08-29: 1500 mL

## 2016-08-29 MED ORDER — IOPAMIDOL (ISOVUE-370) INJECTION 76%
INTRAVENOUS | Status: DC | PRN
  Administered 2016-08-29: 60 mL via INTRA_ARTERIAL

## 2016-08-29 MED ORDER — SODIUM CHLORIDE 0.9 % IV SOLN: INTRAVENOUS | Status: DC

## 2016-08-29 MED ORDER — SODIUM CHLORIDE 0.9% FLUSH
3.0000 mL | Freq: Two times a day (BID) | INTRAVENOUS | Status: DC
  Administered 2016-08-31: 3 mL via INTRAVENOUS

## 2016-08-29 MED ORDER — MIDAZOLAM HCL 2 MG/2ML IJ SOLN
INTRAMUSCULAR | Status: AC
  Filled 2016-08-29: qty 2

## 2016-08-29 MED ORDER — SODIUM CHLORIDE 0.9% FLUSH
3.0000 mL | Freq: Two times a day (BID) | INTRAVENOUS | Status: DC
  Administered 2016-08-29 – 2016-09-15 (×18): 3 mL via INTRAVENOUS

## 2016-08-29 MED ORDER — ACETAMINOPHEN 325 MG PO TABS: 650.0000 mg | ORAL_TABLET | ORAL | Status: DC | PRN

## 2016-08-29 MED ORDER — SODIUM CHLORIDE 0.9 % IV SOLN: 250.0000 mL | INTRAVENOUS | Status: DC | PRN

## 2016-08-29 MED ORDER — HEPARIN (PORCINE) IN NACL 2-0.9 UNIT/ML-% IJ SOLN
INTRAMUSCULAR | Status: AC
  Filled 2016-08-29: qty 1000

## 2016-08-29 MED ORDER — DOCUSATE SODIUM 100 MG PO CAPS
100.0000 mg | ORAL_CAPSULE | Freq: Every day | ORAL | Status: DC | PRN
  Administered 2016-08-29 – 2016-09-16 (×3): 100 mg via ORAL
  Filled 2016-08-29 (×3): qty 1

## 2016-08-29 MED ORDER — ASPIRIN 81 MG PO CHEW: 81.0000 mg | CHEWABLE_TABLET | ORAL | Status: DC

## 2016-08-29 MED ORDER — MIDAZOLAM HCL 2 MG/2ML IJ SOLN
INTRAMUSCULAR | Status: DC | PRN
  Administered 2016-08-29 (×2): 1 mg via INTRAVENOUS

## 2016-08-29 MED ORDER — LIDOCAINE HCL (PF) 1 % IJ SOLN
INTRAMUSCULAR | Status: DC | PRN
  Administered 2016-08-29 (×2): 2 mL via INTRADERMAL

## 2016-08-29 MED ORDER — HEPARIN SODIUM (PORCINE) 5000 UNIT/ML IJ SOLN: 5000.0000 [IU] | Freq: Three times a day (TID) | INTRAMUSCULAR | Status: DC

## 2016-08-29 MED ORDER — HEPARIN (PORCINE) IN NACL 2-0.9 UNIT/ML-% IJ SOLN
INTRAMUSCULAR | Status: AC
  Filled 2016-08-29: qty 500

## 2016-08-29 MED ORDER — HEPARIN SODIUM (PORCINE) 1000 UNIT/ML IJ SOLN
INTRAMUSCULAR | Status: DC | PRN
  Administered 2016-08-29: 4500 [IU] via INTRAVENOUS

## 2016-08-29 MED ORDER — FENTANYL CITRATE (PF) 100 MCG/2ML IJ SOLN
INTRAMUSCULAR | Status: DC | PRN
  Administered 2016-08-29 (×3): 25 ug via INTRAVENOUS

## 2016-08-29 MED ORDER — ONDANSETRON HCL 4 MG/2ML IJ SOLN: 4.0000 mg | Freq: Four times a day (QID) | INTRAMUSCULAR | Status: DC | PRN

## 2016-08-29 MED ORDER — POTASSIUM CHLORIDE 10 MEQ/50ML IV SOLN
10.0000 meq | INTRAVENOUS | Status: AC
  Administered 2016-08-29 (×4): 10 meq via INTRAVENOUS
  Filled 2016-08-29 (×5): qty 50

## 2016-08-29 SURGICAL SUPPLY — 13 items
CATH BALLN WEDGE 5F 110CM (CATHETERS) ×2 IMPLANT
CATH INFINITI 5 FR JL3.5 (CATHETERS) ×2 IMPLANT
CATH INFINITI 5FR MULTPACK ANG (CATHETERS) ×2 IMPLANT
DEVICE RAD COMP TR BAND LRG (VASCULAR PRODUCTS) ×2 IMPLANT
GLIDESHEATH SLEND SS 6F .021 (SHEATH) ×2 IMPLANT
KIT HEART LEFT (KITS) ×2 IMPLANT
PACK CARDIAC CATHETERIZATION (CUSTOM PROCEDURE TRAY) ×2 IMPLANT
SHEATH FAST CATH BRACH 5F 5CM (SHEATH) ×2 IMPLANT
SYR MEDRAD MARK V 150ML (SYRINGE) ×2 IMPLANT
TRANSDUCER W/STOPCOCK (MISCELLANEOUS) ×2 IMPLANT
TUBING CIL FLEX 10 FLL-RA (TUBING) ×2 IMPLANT
WIRE HI TORQ VERSACORE-J 145CM (WIRE) ×2 IMPLANT
WIRE SAFE-T 1.5MM-J .035X260CM (WIRE) ×2 IMPLANT

## 2016-08-29 NOTE — Progress Notes (Signed)
CARDIAC REHAB PHASE I   PRE:  Rate/Rhythm: 108 afib with PVCs    BP: sitting 101/82    SaO2: 98 RA  MODE:  Ambulation: 350 ft   POST:  Rate/Rhythm: 141 afib    BP: sitting 99/75     SaO2: 96 RA  Pt ambulated independently (did not want assist). Slightly wobbly at times, sts he has an old ankle injury. No LOB. Rest x1 for SOB, "but not bad". HR up to 141 afib. Return to recliner with feet elevated. Will f/u. 1324-4010   Harriet Masson CES, ACSM 08/29/2016 11:07 AM

## 2016-08-29 NOTE — Progress Notes (Addendum)
Patient ID: Lucas Munoz, male   DOB: Aug 18, 1951, 65 y.o.   MRN: [id number]    SUBJECTIVE: PICC placed yesterday, initial co-ox 47%.  He was started on milrinone, co-ox 62% this morning. Yesterday diamox added and spiro was increased.  Creatinine down to 1.66   Brisk diuresis noted.      Denies SOB   Echo (10/14): EF 15%, severe AS, moderately decreased RV systolic function, PASP 65 mmHg, moderate to large pericardial effusion without tamponade.   Scheduled Meds: . acetaZOLAMIDE  250 mg Oral BID  . famotidine  10 mg Oral BID  . furosemide  80 mg Intravenous BID  . sodium chloride flush  10-40 mL Intracatheter Q12H  . sodium chloride flush  3 mL Intravenous Q12H  . spironolactone  25 mg Oral Daily   Continuous Infusions: . amiodarone 30 mg/hr (08/29/16 0434)  . heparin 1,450 Units/hr (08/29/16 0434)  . milrinone 0.25 mcg/kg/min (08/28/16 2300)  . norepinephrine (LEVOPHED) Adult infusion     PRN Meds:.sodium chloride, acetaminophen, ondansetron (ZOFRAN) IV, sodium chloride flush, sodium chloride flush    Vitals:   08/29/16 0003 08/29/16 0200 08/29/16 0400 08/29/16 0600  BP: 91/77 97/78 102/66 103/75  Pulse: (!) 104  67 67  Resp:      Temp: 98.1 F (36.7 C)  98.3 F (36.8 C)   TempSrc: Oral  Oral   SpO2: 97%  92% (!) 82%  Weight:   197 lb 8 oz (89.6 kg)   Height:        Intake/Output Summary (Last 24 hours) at 08/29/16 0701 Last data filed at 08/29/16 0600  Gross per 24 hour  Intake           2027.3 ml  Output             5010 ml  Net          -2982.7 ml    LABS: Basic Metabolic Panel:  Recent Labs  40/98/11 0837  08/28/16 1432 08/29/16 0430  NA 137  < > 139 137  K 3.6  < > 3.3* 3.4*  CL 97*  < > 96* 97*  CO2 28  < > 33* 32  GLUCOSE 121*  < > 160* 114*  BUN 49*  < > 28* 25*  CREATININE 2.64*  < > 1.82* 1.66*  CALCIUM 8.8*  < > 8.7* 8.6*  MG 2.3  --   --   --   < > = values in this interval not displayed. Liver Function Tests:  Recent Labs  08/27/16 0500   AST 108*  ALT 154*  ALKPHOS 70  BILITOT 2.8*  PROT 5.9*  ALBUMIN 2.7*   No results for input(s): LIPASE, AMYLASE in the last 72 hours. CBC:  Recent Labs  08/28/16 0455 08/29/16 0430  WBC 4.8 5.5  HGB 12.2* 11.4*  HCT 39.7 37.7*  MCV 86.3 86.9  PLT 129* 141*   Cardiac Enzymes: No results for input(s): CKTOTAL, CKMB, CKMBINDEX, TROPONINI in the last 72 hours. BNP: Invalid input(s): POCBNP D-Dimer: No results for input(s): DDIMER in the last 72 hours. Hemoglobin A1C: No results for input(s): HGBA1C in the last 72 hours. Fasting Lipid Panel: No results for input(s): CHOL, HDL, LDLCALC, TRIG, CHOLHDL, LDLDIRECT in the last 72 hours. Thyroid Function Tests: No results for input(s): TSH, T4TOTAL, T3FREE, THYROIDAB in the last 72 hours.  Invalid input(s): FREET3 Anemia Panel: No results for input(s): VITAMINB12, FOLATE, FERRITIN, TIBC, IRON, RETICCTPCT in the last 72 hours.  RADIOLOGY: Dg  Chest Port 1 View  Result Date: 08/26/2016 CLINICAL DATA:  65 year old male with a history of shortness of breath EXAM: PORTABLE CHEST 1 VIEW COMPARISON:  08/25/2016, CT chest 05/01/2016 FINDINGS: Compare to the prior plain film there is unchanged size of the cardiac silhouette. Calcifications of the aortic arch. Interval placement of right upper extremity PICC with the tip appearing to terminate at the superior cavoatrial junction. Increasing opacity at the right base with thickening of the minor fissure, obscuration the right hemidiaphragm and right heart border. Interlobular septal thickening. IMPRESSION: Evidence of CHF with increasing right-sided pleural effusion/atelectasis. Unchanged cardiac silhouette in this patient with a history of pericardial effusion imaged on prior CT 05/01/2016. Interval placement of right upper extremity PICC. Aortic atherosclerosis. Signed, Yvone NeuJaime S. Loreta AveWagner, DO [facility] Electronically Signed   By: Gilmer MorJaime   Wagner D.O.   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 Dilated cardiomyopathy. Acute kidney injury. EXAM: PORTABLE CHEST 1 VIEW COMPARISON:  None. FINDINGS: Marked cardiac enlargement noted. Diffuse interstitial infiltrates consistent with mild interstitial edema. Small right pleural effusion also seen with right basilar atelectasis. IMPRESSION: Mild congestive heart failure, with small right pleural effusion and right basilar atelectasis. Electronically Signed   By: Myles RosenthalJohn  Stahl M.D.   On: 08/25/2016 15:14    PHYSICAL EXAM CVP 12-13 General: NAD Neck: JVP 12 cm, no thyromegaly or thyroid nodule.  Lungs: Clear  CV: Nondisplaced PMI.  Heart irregular S1/S2, no S3/S4, 3/6 crescendo-decrescendo murmur RUSB.  Trace-1+ ankle edema.   Abdomen: Soft, nontender, no hepatosplenomegaly, no distention.  Neurologic: Alert and oriented x 3.  Psych: Normal affect. Extremities: No clubbing or cyanosis. RUE PICC  TELEMETRY: A fib 100s   ASSESSMENT AND PLAN: 65 y/o ?with a h/o severe AS, LV dysfxn, HTN, noncompliance, thyroid nodules, pericardial effusion, cholelithiasis, and persistent AF on eliquis, who presentedto clinic on 08/23/16 with volume overload and was admitted for further work up of AS and CHF.  1. Acute on chronic systolic CHF: EF 16-10%20-25% in setting of severe aortic stenosis.  Cause uncertain, could be from the valvular disease itself but cannot rule out underlying CAD.  Has not had prior cardiac cath.  This admission, he was initially hypotensive with SBP in 80s though BP has improved to 100s-110s range.  He is also markedly volume overloaded. PICC was placed, low output confirmed by co-ox 47%.  Milrinone 0.25 started, co-ox 62% this morning.  Creatinine now lower (1.66). UOP improved--> Negative 2.9 liters.  Weight unchanged but he was weighed in the bed.  CVP down to 12.   - CO2 trending down.   Continue  Diamox 250 twice daily today then stop.    - Continue milrinone 0.25.   - Lasix 80 mg IV bid to continue.  Needs more diuresis.   - K 3.4. Give 4 runs of k.  Continue spiro 25 mg daily.   - Possible LHC today.   2. Aortic stenosis: Severe AS.  Will need AVR, likely best suited for TAVR.  He will need to be medically optimized prior to this.  3. AKI on CKD stage III: Baseline creatinine seems to be around 1.7-1.8, rose to 3.42, now down to 1.66.  Possible ATN in setting of hypotension. BP better now, on milrinone to increase inotropy.  4. Atrial fibrillation: Persistent, with RVR this admission.  He has been in atrial fibrillation at  least since 6/17.  No prior DCCV.  Not sure how long prior to 6/17 he was in atrial fibrillation.  - Will use amiodarone gtt for now for rate control, HR now in 100s with milrinone running.  - Continue heparin gtt (was on apixaban at home).  5. Pericardial effusion: Moderate to large, no tamponade. 6. Cirrhosis: Noted on prior abdominal CT, repeat LFTs tomorrow. 7. Noncompliance: From prior notes, patient has a history of medical noncompliance.  He does not have a lot of trust in the medical system in general.    Amy Clegg  NP-C  08/29/2016 7:01 AM   Patient seen with NP, agree with the above note.  CVP 12 today with co-ox 62%.  He diuresed well yesterday.  Creatinine continues to come down, appears to be below prior baseline.   - Continue diuretics one more day.  - Think he is ready for left and right heart cath today, will work to minimize contrast.  - Will need to start moving towards AVR, will have TAVR evaluation.   Marca Ancona 08/29/2016 7:41 AM

## 2016-08-29 NOTE — Progress Notes (Addendum)
ANTICOAGULATION CONSULT NOTE - Follow-up Consult  Pharmacy Consult for Heparin Indication: atrial fibrillation   Assessment: 65yom on apixaban for afib, now transitioning to heparin. Last apixaban dose 10/13 am. Heparin level now correlating with APTT, so will use heparin level for dosing. Heparin level 0.61 - at goal. No issues reported by RN. Hgb and PLT stable.  Goal of Therapy:  APTT 66-102 seconds Heparin level 0.3-0.7 units/ml Monitor platelets by anticoagulation protocol: Yes   Plan:  1) Continue heparin at 1450 units/hr 2) Daily heparin level, APTT, CBC  ADDENDUM: Patient is now s/p cath. Heparin to resume 8 hours post sheath removal. Radial sheath removed ~ 1415. Will resume heparin at 2200 tonight at previous therapeutic rate of 1450 units/hr and follow up AM labs.  Louie Casa, PharmD, BCPS 08/29/2016, 3:02 PM  Patient Measurements: Height: 5\' 8"  (172.7 cm) Weight: 197 lb 8 oz (89.6 kg) IBW/kg (Calculated) : 68.4 Heparin Dosing Weight: 86.8kg  Vital Signs: Temp: 98.3 F (36.8 C) (10/17 0400) Temp Source: Oral (10/17 0400) BP: 103/75 (10/17 0600) Pulse Rate: 67 (10/17 0600)  Labs:  Recent Labs  08/26/16 2046 08/27/16 0500  08/28/16 0455 08/28/16 1432 08/29/16 0430  HGB  --  12.1*  --  12.2*  --  11.4*  HCT  --  38.2*  --  39.7  --  37.7*  PLT  --  160  --  129*  --  141*  APTT 62* 87*  --  92*  --   --   LABPROT  --   --   --   --   --  16.0*  INR  --   --   --   --   --  1.27  HEPARINUNFRC  --  1.02*  --  0.82*  --  0.61  CREATININE  --  2.21*  < > 1.81* 1.82* 1.66*  < > = values in this interval not displayed.  Estimated Creatinine Clearance: 47 mL/min (by C-G formula based on SCr of 1.66 mg/dL (H)).  Sheppard Coil PharmD., BCPS Clinical Pharmacist Pager [phone] 08/29/2016 7:13 AM

## 2016-08-30 ENCOUNTER — Encounter (HOSPITAL_COMMUNITY): Payer: Self-pay | Admitting: Cardiology

## 2016-08-30 DIAGNOSIS — I313 Pericardial effusion (noninflammatory): Secondary | ICD-10-CM

## 2016-08-30 LAB — COMPREHENSIVE METABOLIC PANEL
ALBUMIN: 2.8 g/dL — AB (ref 3.5–5.0)
ALT: 80 U/L — AB (ref 17–63)
AST: 36 U/L (ref 15–41)
Alkaline Phosphatase: 79 U/L (ref 38–126)
Anion gap: 8 (ref 5–15)
BILIRUBIN TOTAL: 2.4 mg/dL — AB (ref 0.3–1.2)
BUN: 22 mg/dL — AB (ref 6–20)
CHLORIDE: 94 mmol/L — AB (ref 101–111)
CO2: 30 mmol/L (ref 22–32)
CREATININE: 1.91 mg/dL — AB (ref 0.61–1.24)
Calcium: 8.8 mg/dL — ABNORMAL LOW (ref 8.9–10.3)
GFR calc Af Amer: 40 mL/min — ABNORMAL LOW (ref >60)
GFR, EST NON AFRICAN AMERICAN: 35 mL/min — AB (ref >60)
GLUCOSE: 165 mg/dL — AB (ref 65–99)
Potassium: 3.5 mmol/L (ref 3.5–5.1)
Sodium: 132 mmol/L — ABNORMAL LOW (ref 135–145)
TOTAL PROTEIN: 6.3 g/dL — AB (ref 6.5–8.1)

## 2016-08-30 LAB — BASIC METABOLIC PANEL
Anion gap: 7 (ref 5–15)
BUN: 21 mg/dL — AB (ref 6–20)
CO2: 30 mmol/L (ref 22–32)
CREATININE: 1.73 mg/dL — AB (ref 0.61–1.24)
Calcium: 8.7 mg/dL — ABNORMAL LOW (ref 8.9–10.3)
Chloride: 97 mmol/L — ABNORMAL LOW (ref 101–111)
GFR calc Af Amer: 45 mL/min — ABNORMAL LOW (ref >60)
GFR, EST NON AFRICAN AMERICAN: 39 mL/min — AB (ref >60)
Glucose, Bld: 201 mg/dL — ABNORMAL HIGH (ref 65–99)
Potassium: 3.4 mmol/L — ABNORMAL LOW (ref 3.5–5.1)
SODIUM: 134 mmol/L — AB (ref 135–145)

## 2016-08-30 LAB — CBC
HCT: 38.7 % — ABNORMAL LOW (ref 39.0–52.0)
Hemoglobin: 11.9 g/dL — ABNORMAL LOW (ref 13.0–17.0)
MCH: 26.2 pg (ref 26.0–34.0)
MCHC: 30.7 g/dL (ref 30.0–36.0)
MCV: 85.1 fL (ref 78.0–100.0)
PLATELETS: 122 10*3/uL — AB (ref 150–400)
RBC: 4.55 MIL/uL (ref 4.22–5.81)
RDW: 18.2 % — AB (ref 11.5–15.5)
WBC: 6.1 10*3/uL (ref 4.0–10.5)

## 2016-08-30 LAB — COOXEMETRY PANEL
Carboxyhemoglobin: 1.4 % (ref 0.5–1.5)
Methemoglobin: 0.9 % (ref 0.0–1.5)
O2 Saturation: 62 %
Total hemoglobin: 11.9 g/dL — ABNORMAL LOW (ref 12.0–16.0)

## 2016-08-30 LAB — HEPARIN LEVEL (UNFRACTIONATED): HEPARIN UNFRACTIONATED: 0.48 [IU]/mL (ref 0.30–0.70)

## 2016-08-30 MED ORDER — ASPIRIN 81 MG PO CHEW
81.0000 mg | CHEWABLE_TABLET | Freq: Every day | ORAL | Status: DC
  Administered 2016-08-30 – 2016-08-31 (×2): 81 mg via ORAL
  Filled 2016-08-30 (×2): qty 1

## 2016-08-30 MED ORDER — POTASSIUM CHLORIDE 10 MEQ/50ML IV SOLN
10.0000 meq | INTRAVENOUS | Status: AC
  Administered 2016-08-30 (×3): 10 meq via INTRAVENOUS
  Filled 2016-08-30 (×2): qty 50

## 2016-08-30 MED ORDER — ATORVASTATIN CALCIUM 40 MG PO TABS
40.0000 mg | ORAL_TABLET | Freq: Every day | ORAL | Status: DC
  Administered 2016-08-30 – 2016-09-15 (×17): 40 mg via ORAL
  Filled 2016-08-30 (×17): qty 1

## 2016-08-30 MED ORDER — POTASSIUM CHLORIDE 10 MEQ/50ML IV SOLN
10.0000 meq | Freq: Once | INTRAVENOUS | Status: AC
Start: 2016-08-30 — End: 2016-08-30
  Administered 2016-08-30: 10 meq via INTRAVENOUS
  Filled 2016-08-30: qty 50

## 2016-08-30 MED ORDER — DIGOXIN 125 MCG PO TABS
0.1250 mg | ORAL_TABLET | Freq: Every day | ORAL | Status: DC
  Administered 2016-08-30 – 2016-09-07 (×9): 0.125 mg via ORAL
  Filled 2016-08-30 (×9): qty 1

## 2016-08-30 MED ORDER — POLYETHYLENE GLYCOL 3350 17 G PO PACK
17.0000 g | PACK | Freq: Every day | ORAL | Status: DC
  Administered 2016-08-30 – 2016-09-06 (×6): 17 g via ORAL
  Filled 2016-08-30 (×14): qty 1

## 2016-08-30 MED FILL — Verapamil HCl IV Soln 2.5 MG/ML: INTRAVENOUS | Qty: 2 | Status: AC

## 2016-08-30 NOTE — Progress Notes (Signed)
Received to 3W 23 via bed from 2 Heart. Milirinone infusing at 0.375 mcg/kg/min, Amiodarone infusing 30 mg/hr, Heparin gtt infusing at 1450 units/hr per Left FA PIV. IV potassium 10 meq/100 ml infusing. Oriented to unit. Call Bell in reach. Supper tray provided. No further needs expressed at this time.

## 2016-08-30 NOTE — Progress Notes (Signed)
Patient ID: Lucas Munoz, male   DOB: 01/16/1951, 65 y.o.   MRN: [id number]    SUBJECTIVE: PICC placed yesterday, initial co-ox 47%.  He was started on milrinone.   Yesterday milrinone was increased to 0.375 mcg for low mixed venous sat on RHC (CI 1.9). Todays CO-OX 62%. Creatinine trending up 1.6>1.9.  Brisk diuresis.  CVP down to 7 this morning.   Denies SOB   RHC/LHC (08/29/16) Left Main  Mild ostial narrowing.  Left Anterior Descending  30% proximal LAD stenosis. 60% distal LAD stenosis. Moderate D1 without significant disease. Small to moderate D2 with long 70% ostial stenosis.  Left Circumflex  50% proximal LCx stenosis at OM1. Moderate OM1 with 30% ostial stenosis.  Right Coronary Artery  40% proximal and 40% mid RCA stenosis.    RA mean 19 RV 65/15 PA 69/34, mean 51 PCWP mean 43 (not sure we got an accurate PCWP).  AO 106/87 Oxygen saturations: PA 46% AO 92% Cardiac Output (Fick) 3.8  Cardiac Index (Fick) 1.9  Echo (10/14): EF 15%, severe AS, moderately decreased RV systolic function, PASP 65 mmHg, moderate to large pericardial effusion without tamponade.   Scheduled Meds: . famotidine  10 mg Oral BID  . furosemide  80 mg Intravenous BID  . sodium chloride flush  10-40 mL Intracatheter Q12H  . sodium chloride flush  3 mL Intravenous Q12H  . sodium chloride flush  3 mL Intravenous Q12H  . sodium chloride flush  3 mL Intravenous Q12H  . spironolactone  25 mg Oral Daily   Continuous Infusions: . amiodarone 30 mg/hr (08/30/16 0424)  . heparin 1,450 Units/hr (08/29/16 2205)  . milrinone 0.375 mcg/kg/min (08/30/16 0230)  . norepinephrine (LEVOPHED) Adult infusion     PRN Meds:.sodium chloride, sodium chloride, sodium chloride, acetaminophen, docusate sodium, ondansetron (ZOFRAN) IV, sodium chloride flush, sodium chloride flush, sodium chloride flush, sodium chloride flush   Vitals:   08/30/16 0300 08/30/16 0400 08/30/16 0500 08/30/16 0600  BP: 99/70 92/69 91/79   100/77  Pulse: (!) 50 (!) 104 62 88  Resp:      Temp:  97.6 F (36.4 C)    TempSrc:  Oral    SpO2: 95% 92% 92% 94%  Weight:      Height:        Intake/Output Summary (Last 24 hours) at 08/30/16 0705 Last data filed at 08/30/16 0424  Gross per 24 hour  Intake          1271.21 ml  Output             4245 ml  Net         -2973.79 ml    LABS: Basic Metabolic Panel:  Recent Labs  08/65/7810/17/17 0430 08/30/16 0420  NA 137 132*  K 3.4* 3.5  CL 97* 94*  CO2 32 30  GLUCOSE 114* 165*  BUN 25* 22*  CREATININE 1.66* 1.91*  CALCIUM 8.6* 8.8*   Liver Function Tests:  Recent Labs  08/30/16 0420  AST 36  ALT 80*  ALKPHOS 79  BILITOT 2.4*  PROT 6.3*  ALBUMIN 2.8*   No results for input(s): LIPASE, AMYLASE in the last 72 hours. CBC:  Recent Labs  08/29/16 0430 08/30/16 0420  WBC 5.5 6.1  HGB 11.4* 11.9*  HCT 37.7* 38.7*  MCV 86.9 85.1  PLT 141* 122*   Cardiac Enzymes: No results for input(s): CKTOTAL, CKMB, CKMBINDEX, TROPONINI in the last 72 hours. BNP: Invalid input(s): POCBNP D-Dimer: No results for input(s): DDIMER in the  last 72 hours. Hemoglobin A1C: No results for input(s): HGBA1C in the last 72 hours. Fasting Lipid Panel: No results for input(s): CHOL, HDL, LDLCALC, TRIG, CHOLHDL, LDLDIRECT in the last 72 hours. Thyroid Function Tests: No results for input(s): TSH, T4TOTAL, T3FREE, THYROIDAB in the last 72 hours.  Invalid input(s): FREET3 Anemia Panel: No results for input(s): VITAMINB12, FOLATE, FERRITIN, TIBC, IRON, RETICCTPCT in the last 72 hours.  RADIOLOGY: Dg Chest Port 1 View  Result Date: 08/26/2016 CLINICAL DATA:  65 year old male with a history of shortness of breath EXAM: PORTABLE CHEST 1 VIEW COMPARISON:  08/25/2016, CT chest 05/01/2016 FINDINGS: Compare to the prior plain film there is unchanged size of the cardiac silhouette. Calcifications of the aortic arch. Interval placement of right upper extremity PICC with the tip appearing to  terminate at the superior cavoatrial junction. Increasing opacity at the right base with thickening of the minor fissure, obscuration the right hemidiaphragm and right heart border. Interlobular septal thickening. IMPRESSION: Evidence of CHF with increasing right-sided pleural effusion/atelectasis. Unchanged cardiac silhouette in this patient with a history of pericardial effusion imaged on prior CT 05/01/2016. Interval placement of right upper extremity PICC. Aortic atherosclerosis. Signed, Yvone Neu. Loreta Ave, DO [facility] Radiology Electronically Signed   By: Gilmer Mor D.O.   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 Dilated cardiomyopathy. Acute kidney injury. EXAM: PORTABLE CHEST 1 VIEW COMPARISON:  None. FINDINGS: Marked cardiac enlargement noted. Diffuse interstitial infiltrates consistent with mild interstitial edema. Small right pleural effusion also seen with right basilar atelectasis. IMPRESSION: Mild congestive heart failure, with small right pleural effusion and right basilar atelectasis. Electronically Signed   By: Myles Rosenthal M.D.   On: 08/25/2016 15:14    PHYSICAL EXAM CVP 7-8 General: NAD Neck: JVP ~8 no thyromegaly or thyroid nodule.  Lungs: Clear  CV: Nondisplaced PMI.  Heart irregular S1/S2, no S3/S4, 3/6 crescendo-decrescendo murmur RUSB.  Trace-1+ ankle edema.   Abdomen: Soft, nontender, no hepatosplenomegaly, no distention.  Neurologic: Alert and oriented x 3.  Psych: Normal affect. Extremities: No clubbing or cyanosis. RUE PICC  TELEMETRY: A fib 100s   ASSESSMENT AND PLAN: 65 y/o ?with a h/o severe AS, LV dysfxn, HTN, noncompliance, thyroid nodules, pericardial effusion, cholelithiasis, and persistent AF on eliquis, who presentedto clinic on 08/23/16 with volume overload and was admitted for further work up of AS and CHF.    1. Acute on chronic systolic CHF: EF 81-10% in setting of severe aortic stenosis.  May be due to severe aortic stenosis given lack of flow-limiting CAD.  This admission, he was initially hypotensive with SBP in 80s though BP has improved to 100s-110s range.  He is also markedly volume overloaded. PICC was placed, low output confirmed by co-ox 47%.  Milrinone 0.25 started then increased to 0.375, co-ox 62% this morning.  Creatinine up today post cath 1.9.  UOP improved--> Negative 2.9 liters.   CVP down to 7-8.  - Stop IV lasix.  Hold diuretics today. Anticipate restarting Lasix po. - CO2 30. Stop  Diamox.     - Continue milrinone 0.375 mcg .   - K 3.5. Repeat BMET at 1300 Can add runs if needed at that time.   - Continue spiro 25 mg daily.   2. Aortic stenosis: Severe AS.  Will need AVR, likely best suited for TAVR.  He will need to be medically optimized  prior to this. Timing per Dr Shirlee Latch. - Dr. Clifton James to see today.   3. AKI on CKD stage III: Baseline creatinine seems to be around 1.7-1.8, rose to 3.42, down to 1.66 but today up to 1.91.  Possible ATN in setting of hypotension. BP better now, on milrinone to increase inotropy.   - Holding diuretics today with CVP down to 7.  4. Atrial fibrillation: Persistent, with RVR this admission.  He has been in atrial fibrillation at least since 6/17.  No prior DCCV.  Not sure how long prior to 6/17 he was in atrial fibrillation.  - Will use amiodarone gtt for now for rate control, HR now in 100s at rest with milrinone running.  - Continue heparin gtt (was on apixaban at home).  - Will add digoxin today to help with rate control  - Would like to eventually cardiovert him.  Currently requiring milrinone so less likely to hold NSR, ?wait until post-TAVR.  Will discuss with Dr Clifton James.  5. Pericardial effusion: Moderate to large, no tamponade.  Will need to decide on whether to address this peri-TAVR.  6. Cirrhosis: Noted on prior abdominal CT, LFTs much  improved today with diuresis.  7. Noncompliance: From prior notes, patient has a history of medical noncompliance.  He does not have a lot of trust in the medical system in general. 8. CAD:  LHC with moderate coronary disease, nothing flow-limiting. Add statin and 81 mg aspirin.   Amy Clegg  NP-C  08/30/2016 7:05 AM   Patient seen with NP, agree with the above note.  Doing well, good co-ox, CVP down to 7.   - Hold diuretics today, likely restart po tomorrow.   HR remains 100s at rest in atrial fibrillation.   - Add digoxin to help with rate control, remains on amiodarone.   - Eventually want to cardiovert, but currently requiring milrinone.  ?post-TAVR.   Severe AS may be cause of cardiomyopathy given lack of flow-limiting CAD.  To be seen by Dr Clifton James today for TAVR evaluation.   Will need to discuss how to approach his pericardial effusion (?peri-TAVR).  It is moderate to large but there is no tamponade.   Marca Ancona 08/30/2016 7:44 AM

## 2016-08-30 NOTE — Progress Notes (Signed)
CARDIAC REHAB PHASE I   PRE:  Rate/Rhythm: 104 a fib  BP:  Sitting: 108/92        SaO2: 99 ra  MODE:  Ambulation: 350 ft   POST:  Rate/Rhythm: 136 a fib  BP:  Sitting: 124/89         SaO2: 96 RA  Pt ambulated 350 ft on RA, IV, assist x1 (to push IV pole, pt declined any additional assistance), steady gait, tolerated well with no complaints. Pt HR elevated 130s a fib. Pt to bed per pt request after walk, call bell within reach. Encouraged out of bed as tolerated. Will follow.   [id number] Joylene Grapes, RN, BSN 08/30/2016 3:08 PM

## 2016-08-30 NOTE — Progress Notes (Addendum)
ANTICOAGULATION CONSULT NOTE - Follow-up Consult  Pharmacy Consult for Heparin Indication: atrial fibrillation   Assessment: 65yom on apixaban for afib, now transitioning to heparin. Last apixaban dose 10/13 am. Heparin level now correlating with APTT, so will use heparin level for dosing. Heparin level 0.48 - at goal. No issues reported by RN.  Goal of Therapy:  Heparin level 0.3-0.7 units/ml Monitor platelets by anticoagulation protocol: Yes   Plan:  1) Continue heparin at 1450 units/hr 2) Daily heparin level, CBC   Sherron Monday, PharmD Clinical Pharmacy Resident Pager: [phone] 08/30/16 7:11 AM  Patient Measurements: Height: 5\' 8"  (172.7 cm) Weight: 185 lb 14.4 oz (84.3 kg) IBW/kg (Calculated) : 68.4 Heparin Dosing Weight: 86.8kg  Vital Signs: Temp: 97.6 F (36.4 C) (10/18 0400) Temp Source: Oral (10/18 0400) BP: 100/77 (10/18 0600) Pulse Rate: 88 (10/18 0600)  Labs:  Recent Labs  08/28/16 0455 08/28/16 1432 08/29/16 0430 08/30/16 0420 08/30/16 0431  HGB 12.2*  --  11.4* 11.9*  --   HCT 39.7  --  37.7* 38.7*  --   PLT 129*  --  141* 122*  --   APTT 92*  --   --   --   --   LABPROT  --   --  16.0*  --   --   INR  --   --  1.27  --   --   HEPARINUNFRC 0.82*  --  0.61  --  0.48  CREATININE 1.81* 1.82* 1.66* 1.91*  --     Estimated Creatinine Clearance: 39.7 mL/min (by C-G formula based on SCr of 1.91 mg/dL (H)).

## 2016-08-30 NOTE — Progress Notes (Signed)
TCTS BRIEF PROGRESS NOTE   Patient seen and examined, ECHO and cath reviewed.  Full consult note to follow.  I agree w/ plans outlined by Dr. Clifton James including plans for pericardiocentesis.  Purcell Nails, MD 08/30/2016 6:28 PM

## 2016-08-30 NOTE — Consult Note (Signed)
TAVR/Valve Team Consult Note  Patient ID: Lucas Munoz MRN: [id number] DOB/AGE: 67/06/1951 67 y.o.  Admit date: 08/24/2016 Referring Physician: Marca AnconaMcLean, Dalton Primary Physician: Wilmer FloorAMPBELL, STEPHEN D., MD Primary Cardiologist: Daphene Jaegerom Kelly Reason for Consultation: Aortic stenosis  HPI:  [id number] yo male with history of non-ischemic cardiomyopathy, chronic systolic CHF, CAD, aortic valve stenosis, HTN, chronic systolic CHF, atrial fibrillation, medical non-compliance, chronic kidney disease, pericardial effusion, chronic PE who I am asked to see today regarding his severe aortic valve stenosis. He has been followed by cardiology in the past in [place], [place] (Dr. Roanna Raideravinkar). He was told in 2016 that he needed valve surgery but he did not follow up to discuss further. He established here in our office with Dr. Tresa EndoKelly in May 2017. He was in atrial fibrillation at this visit and was started on Eliquis and his beta blocker was titrated. He was felt to be volume overloaded and his Lasix dose was increased. CT abdomen ordered for workup of abdominal pain showed evidence of hepatic cirrhosis, large pericardial effusion, large right pleural effusion. Echo 04/28/16 with LVEF=20-25% with diffuse LV hypokinesis, moderate LVH and severe AS. The pericardial effusion was felt to be moderate in size at that time with no features of tamponade. There was concern for a dissection flap in his ascending aorta, leading to a chest CTA which showed chronic pulmonary thromboembolism, pericardial and pleural effusions and thyroid hypodense areas. He has yet to have f/u with GI or f/u for the thyroid abnormality.   He had progressive dyspnea with exertion, lower extremity edema and abdominal pain and was admitted to [facility] 08/24/16. He has been followed by the CHF service. He has been diuresing with IV Lasix and milrinone. Renal function has stabilized. He has had persistent atrial fibrillation managed on amiodarone and heparin. Echo  08/26/16 shows severe LV systolic dysfunction with LVEF=15%, moderate LVH. Dilated ascending aorta, severely thickened aortic valve leaflets with restricted opening, mean gradient 46 mmHg, AVA 0.51cnm2. He has a large pericardial effusion with no signs of tamponade. Cardiac cath 08/29/16 with moderate non-obstructive CAD, elevated filling pressures (PCWP 43 mmHg) and cardiac output 3.8 L/min.   He tells me today that he has had a rapid decline in his health over the last 6 months. He has had significant worsening of his LE edema, dyspnea and fatigue. He is feeling somewhat better since he has been diuresed. Dyspnea is dramatically improved. He is from [place] but has lived most recently in [place] and now in [place] for the last 16 years. 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 He most recently worked part time for [employer]. He lives alone. He has two children. One is in [place] and one is in [place]. He has had    Past Medical History:  Diagnosis Date  . Cardiomyopathy (HCC)    a. 04/2016 Echo: EF 20-25%.  . Cataract    right side per patient  . Cholelithiasis    a. 03/2016 Abd CT: 4.1 x 3.4 cm gallstone and other tiny stones in the gb fundus-->referred to GI.  Marland Kitchen. Chronic bronchitis (HCC)   . Chronic pulmonary embolism (HCC)    a. 04/2016 CTA Chest: Sequelae of chronic pulm thromboembolism in RLL. No acute PE.  Marland Kitchen. Chronic systolic CHF (congestive heart failure) (HCC)    a. 04/2016 Echo: EF 20-25%, mod LVH, diff HK w/ anterior, anteroseptal, and apical AK, sev AS (mean grad 40 mmHg, peak grad 66 mmHg), Asc Ao diameter 45  mm - ? flap (not seen on f/u CTA), mild MR, reduced RV fxn, sev dil RA, mod TR, PASP 50 mmHg, small to mod circumferential pericardial effusion.  Marland Kitchen GERD (gastroesophageal reflux disease)   . Heart murmur dx'd 1968  . Hepatic cirrhosis (HCC)    a. 03/2016 Abd CT: evidence for hepatic cirrhosis.  . Hypertension   . Hypertensive heart disease with heart failure  (HCC)   . Lower extremity edema    a. 04/2016 LE U/S: No DVT. Bilat greater saphenous vein incompetence.  . Multiple thyroid nodules    a. 05/2016 Thyroid U/S: Bilat nodules & cysts, largest in left thyroid lobe, 1.4 cm; solid nodule along the medial right thyroid lobe measuring 1.0 cm - rec u/s guided needle bx.  . Pericardial effusion    a. 03/2016 Abd CT: mod to large pericardial effusion; b. 04/2016 Echo: small to mod circumferential pericardial effusion.  . Persistent atrial fibrillation (HCC)    a. On eliquis (CHA2DS2VASc = 3).  . Pleural effusion on right    a. 03/2016 incidental finding on abd CT; b. 04/2016 R pl effusion noted on CTA Chest.  . Severe aortic stenosis     Family History  Problem Relation Age of Onset  . Cancer Mother   . Heart disease Father   . Alcohol abuse Brother   . Cancer Brother   . Cancer Maternal Grandmother   . Alcohol abuse Maternal Grandfather   . Alcohol abuse Paternal Grandfather     Social History   Social History  . Marital status: Divorced    Spouse name: N/A  . Number of children: N/A  . Years of education: N/A   Occupational History  . Not on file.   Social History Main Topics  . Smoking status: Former Smoker    Packs/day: 1.00    Years: 30.00    Types: Cigarettes    Quit date: 04/05/2000  . Smokeless tobacco: Never Used  . Alcohol use 0.0 oz/week     Comment: 08/24/2016 "stopped drinking in 1990"  . Drug use: No  . Sexual activity: Not Currently   Other Topics Concern  . Not on file   Social History Narrative  . No narrative on file    Past Surgical History:  Procedure Laterality Date  . BACK SURGERY    . CARDIAC CATHETERIZATION N/A 08/29/2016   Procedure: Right/Left Heart Cath and Coronary Angiography;  Surgeon: Laurey Morale, MD;  Location: [facility] INVASIVE CV LAB;  Service: Cardiovascular;  Laterality: N/A;  . goiter removed  ~ 1958  . LUMBAR DISC SURGERY  1968   "herniated disc"  . TONSILLECTOMY  1960    Allergies    Allergen Reactions  . Potassium-Containing Compounds Other (See Comments)    Upset stomach   Hospital Medications:  . aspirin  81 mg Oral Daily  . atorvastatin  40 mg Oral q1800  . digoxin  0.125 mg Oral Daily  . famotidine  10 mg Oral BID  . polyethylene glycol  17 g Oral Daily  . potassium chloride  10 mEq Intravenous Q1 Hr x 4  . sodium chloride flush  10-40 mL Intracatheter Q12H  . sodium chloride flush  3 mL Intravenous Q12H  . sodium chloride flush  3 mL Intravenous Q12H  . sodium chloride flush  3 mL Intravenous Q12H  . spironolactone  25 mg Oral Daily   Review of systems complete and found to be negative unless listed above   Telemetry: atrial fib  Physical Exam: Blood pressure 97/66, pulse 61, temperature 97.4 F (36.3 C), temperature source Oral, resp. rate 19, height 5\' 8"  (1.727 m), weight 185 lb 14.4 oz (84.3 kg), SpO2 96 %.    General: Well developed, well nourished, NAD, alert and oriented x 3. HEENT: OP clear, mucus membranes moist  SKIN: warm, dry. No rashes.  Neuro: No focal deficits  Musculoskeletal: Muscle strength 5/5 all ext  Psychiatric: Mood and affect normal  Neck: No JVD, no carotid bruits, no thyromegaly, no lymphadenopathy.  Lungs:Clear bilaterally, no wheezes, rhonci, crackles  Cardiovascular: Irregular irregular with loud systolic murmur.  Abdomen:Soft. Bowel sounds present. Non-tender.  Extremities: No lower extremity edema. Pulses are 2 + in the bilateral DP/PT.  Echo 08/26/16: Left ventricle: The cavity size was normal. Wall thickness was   increased in a pattern of moderate LVH. The estimated ejection   fraction was 15%. Diffuse hypokinesis. Indeterminant diastolic   function (atrial fibrillation). - Aortic valve: Trileaflet; severely calcified leaflets. There was   severe stenosis. There was trivial regurgitation. Mean gradient   (S): 46 mm Hg. Valve area (VTI): 0.51 cm^2. - Aorta: Dilated ascending aorta, 4.3 cm. - Mitral valve:  Mildly calcified annulus. There was mild   regurgitation. - Left atrium: The atrium was moderately to severely dilated. - Right ventricle: The cavity size was normal. Systolic function   was moderately reduced. - Right atrium: The atrium was moderately dilated. - Tricuspid valve: There was moderate regurgitation. Peak RV-RA   gradient (S): 50 mm Hg. - Pulmonary arteries: PA peak pressure: 65 mm Hg (S). - Systemic veins: IVC measured 2.4 cm with < 50% respirophasic   variation, suggesting RA pressure 15 mmHg. - Pericardium, extracardiac: Moderate to large circumferential   pericardial effusion without tamponade.  Impressions:  - The patient was in atrial fibrillation. Normal LV size with   moderate LV hypertrophy. EF 15%, diffuse hypokinesis. Normal RV   size with moderately decreased systolic function. Biatrial   enlargement. Moderate TR, mild MR. There was severe aortic   stenosis. Moderate pulmonary hypertension. There was a moderate   to large circumferential pericardial effusion without evidence   for tamponade.  ------------------------------------------------------------------- Study data:  Comparison was made to the study of 04/28/2016.  Study status:  Routine.  Procedure:  The patient reported no pain pre or post test. Transthoracic echocardiography. Image quality was adequate.  Study completion:  There were no complications. Transthoracic echocardiography.  M-mode, complete 2D, spectral Doppler, and color Doppler.  Birthdate:  Patient birthdate: [birth year].  Age:  Patient is 65 yr old.  Sex:  Gender: male. BMI: 30.6 kg/m^2.  Blood pressure:     97/86  Patient status: Inpatient.  Study date:  Study date: 08/26/2016. Study time: 10:58 AM.  Location:  ICU/CCU  -------------------------------------------------------------------  ------------------------------------------------------------------- Left ventricle:  The cavity size was normal. Wall thickness  was increased in a pattern of moderate LVH. The estimated ejection fraction was 15%. Diffuse hypokinesis. Indeterminant diastolic function (atrial fibrillation).  ------------------------------------------------------------------- Aortic valve:   Trileaflet; severely calcified leaflets.  Doppler:  There was severe stenosis.   There was trivial regurgitation. VTI ratio of LVOT to aortic valve: 0.16. Valve area (VTI): 0.51 cm^2. Indexed valve area (VTI): 0.24 cm^2/m^2. Peak velocity ratio of LVOT to aortic valve: 0.18. Valve area (Vmax): 0.57 cm^2. Indexed valve area (Vmax): 0.27 cm^2/m^2. Mean velocity ratio of LVOT to aortic valve: 0.17. Valve area (Vmean): 0.53 cm^2. Indexed valve area (Vmean): 0.25 cm^2/m^2.    Mean gradient (S): 46  mm Hg.   ------------------------------------------------------------------- Aorta:  Dilated ascending aorta, 4.3 cm.  ------------------------------------------------------------------- Mitral valve:   Mildly calcified annulus.  Doppler:   There was no evidence for stenosis.   There was mild regurgitation.  ------------------------------------------------------------------- Left atrium:  The atrium was moderately to severely dilated.   ------------------------------------------------------------------- Right ventricle:  The cavity size was normal. Systolic function was moderately reduced.  ------------------------------------------------------------------- Pulmonic valve:    Structurally normal valve.   Cusp separation was normal.  Doppler:  Transvalvular velocity was within the normal range. There was trivial regurgitation.  ------------------------------------------------------------------- Tricuspid valve:   Doppler:  There was moderate regurgitation.   ------------------------------------------------------------------- Right atrium:  The atrium was moderately  dilated.  ------------------------------------------------------------------- Pericardium:  Moderate to large circumferential pericardial effusion without tamponade.  ------------------------------------------------------------------- Systemic veins:  IVC measured 2.4 cm with < 50% respirophasic variation, suggesting RA pressure 15 mmHg.  ------------------------------------------------------------------- Post procedure conclusions Ascending Aorta:  - Dilated ascending aorta, 4.3 cm.  ------------------------------------------------------------------- Measurements   Left ventricle                            Value          Reference  LV ID, ED, PLAX chordal           (L)     39    mm       43 - 52  LV ID, ES, PLAX chordal                   35.1  mm       23 - 38  LV fx shortening, PLAX chordal    (L)     10    %        >=29  LV PW thickness, ED                       15.6  mm       ---------  IVS/LV PW ratio, ED                       0.96           <=1.3  Stroke volume, 2D                         39    ml       ---------  Stroke volume/bsa, 2D                     18    ml/m^2   ---------    Ventricular septum                        Value          Reference  IVS thickness, ED                         15    mm       ---------    LVOT                                      Value          Reference  LVOT ID, S  20    mm       ---------  LVOT area                                 3.14  cm^2     ---------  LVOT peak velocity, S                     61.3  cm/s     ---------  LVOT mean velocity, S                     42.3  cm/s     ---------  LVOT VTI, S                               12.5  cm       ---------    Aortic valve                              Value          Reference  Aortic valve peak velocity, S             336   cm/s     ---------  Aortic valve mean velocity, S             252   cm/s     ---------  Aortic valve VTI, S                        77.7  cm       ---------  Aortic mean gradient, S                   35    mm Hg    ---------  VTI ratio, LVOT/AV                        0.16           ---------  Aortic valve area, VTI                    0.51  cm^2     ---------  Aortic valve area/bsa, VTI                0.24  cm^2/m^2 ---------  Velocity ratio, peak, LVOT/AV             0.18           ---------  Aortic valve area, peak velocity          0.57  cm^2     ---------  Aortic valve area/bsa, peak               0.27  cm^2/m^2 ---------  velocity  Velocity ratio, mean, LVOT/AV             0.17           ---------  Aortic valve area, mean velocity          0.53  cm^2     ---------  Aortic valve area/bsa, mean               0.25  cm^2/m^2 ---------  velocity    Aorta  Value          Reference  Aortic root ID, ED                        34    mm       ---------    Left atrium                               Value          Reference  LA ID, A-P, ES                            48    mm       ---------  LA ID/bsa, A-P                    (H)     2.27  cm/m^2   <=2.2  LA volume, S                              123   ml       ---------  LA volume/bsa, S                          58.1  ml/m^2   ---------  LA volume, ES, 1-p A4C                    101   ml       ---------  LA volume/bsa, ES, 1-p A4C                47.7  ml/m^2   ---------  LA volume, ES, 1-p A2C                    137   ml       ---------  LA volume/bsa, ES, 1-p A2C                64.7  ml/m^2   ---------    Pulmonary arteries                        Value          Reference  PA pressure, S, DP                (H)     65    mm Hg    <=30    Tricuspid valve                           Value          Reference  Tricuspid regurg peak velocity            354   cm/s     ---------  Tricuspid peak RV-RA gradient             50    mm Hg    ---------    Systemic veins                            Value          Reference  Estimated CVP  15    mm Hg    ---------    Right ventricle                           Value          Reference  TAPSE                                     12.8  mm       ---------  RV s&', lateral, S                         8.81  cm/s     ---------    Pulmonic valve                            Value          Reference  Pulmonic regurg velocity, ED              178   cm/s     ---------  Pulmonic regurg gradient, ED              13    mm Hg    ---------  Cardiac cath 08/29/16: Left Main  Mild ostial narrowing.  Left Anterior Descending  30% proximal LAD stenosis. 60% distal LAD stenosis. Moderate D1 without significant disease. Small to moderate D2 with long 70% ostial stenosis.  Left Circumflex  50% proximal LCx stenosis at OM1. Moderate OM1 with 30% ostial stenosis.  Right Coronary Artery  40% proximal and 40% mid RCA stenosis.  Right Heart   Right Heart Pressures RHC Procedural Findings: Hemodynamics (mmHg) RA mean 19 RV 65/15 PA 69/34, mean 51 PCWP mean 43 (not sure we got an accurate PCWP).  AO 106/87  Oxygen saturations: PA 46% AO 92%  Cardiac Output (Fick) 3.8  Cardiac Index (Fick) 1.9       Labs:   Lab Results  Component Value Date   WBC 6.1 08/30/2016   HGB 11.9 (L) 08/30/2016   HCT 38.7 (L) 08/30/2016   MCV 85.1 08/30/2016   PLT 122 (L) 08/30/2016     Recent Labs Lab 08/30/16 0420 08/30/16 1305  NA 132* 134*  K 3.5 3.4*  CL 94* 97*  CO2 30 30  BUN 22* 21*  CREATININE 1.91* 1.73*  CALCIUM 8.8* 8.7*  PROT 6.3*  --   BILITOT 2.4*  --   ALKPHOS 79  --   ALT 80*  --   AST 36  --   GLUCOSE 165* 201*   No results found for: CKTOTAL, CKMB, CKMBINDEX, TROPONINI    EKG: atrial fib  ASSESSMENT AND PLAN:   1. Severe aortic stenosis 2. Large pericardial effusion without tamponade 3. Non-ischemic cardiomyopathy with severe LV systolic dysfunction 4. Persistent atrial fibrillation 5. Chronic Pulmonary thromboembolic disease 6. Acute on chronic renal  failure  He has stage D symptomatic aortic valve stenosis. I have personally reviewed the echo images. The aortic valve is thickened, calcified with limited leaflet mobility. I think he would benefit from AVR. Given his multiple co-morbidities as listed above, I do not think he would be a good candidate for conventional AVR by surgical approach. I think he may be a good candidate for TAVR. I have reviewed the TAVR procedure in detail today with  the patient today. He has no family here. He would like to proceed with planning for TAVR. He has already undergone right and left heart catheterization. He understands that his procedure will be potentially complicated by his other conditions.  -I will ask the CT surgeons on our TAVR team to see him this week -I will arrange the Cardiac CT and CTA chest abdomen/pelvis in a staged fashion over the next few days pending his renal function.  -PFTs and carotid dopplers -I think he would benefit from pericardiocentesis prior to the TAVR procedure. I will try to arrange this on Friday if he is stable.  -I would not plan cardioversion before the TAVR procedure as he is rate controlled.   -will review findings with the TAVR team and will follow along. We may not be able to complete the CT scans prior to next Tuesday given his renal failure.    Signed: Verne Carrow, MD 08/30/2016, 4:15 PM

## 2016-08-31 ENCOUNTER — Inpatient Hospital Stay (HOSPITAL_COMMUNITY): Payer: Commercial Managed Care - HMO

## 2016-08-31 DIAGNOSIS — I5021 Acute systolic (congestive) heart failure: Secondary | ICD-10-CM

## 2016-08-31 LAB — PULMONARY FUNCTION TEST
FEF 25-75 POST: 1.26 L/s
FEF 25-75 Pre: 0.92 L/sec
FEF2575-%CHANGE-POST: 36 %
FEF2575-%PRED-POST: 52 %
FEF2575-%Pred-Pre: 38 %
FEV1-%CHANGE-POST: 6 %
FEV1-%Pred-Post: 43 %
FEV1-%Pred-Pre: 40 %
FEV1-Post: 1.34 L
FEV1-Pre: 1.26 L
FEV1FVC-%Change-Post: 2 %
FEV1FVC-%PRED-PRE: 101 %
FEV6-%Change-Post: 4 %
FEV6-%Pred-Post: 44 %
FEV6-%Pred-Pre: 42 %
FEV6-Post: 1.75 L
FEV6-Pre: 1.68 L
FEV6FVC-%Change-Post: 0 %
FEV6FVC-%Pred-Post: 106 %
FEV6FVC-%Pred-Pre: 106 %
FVC-%Change-Post: 4 %
FVC-%PRED-POST: 41 %
FVC-%PRED-PRE: 40 %
FVC-POST: 1.75 L
FVC-PRE: 1.68 L
POST FEV1/FVC RATIO: 76 %
PRE FEV6/FVC RATIO: 100 %
Post FEV6/FVC ratio: 100 %
Pre FEV1/FVC ratio: 75 %

## 2016-08-31 LAB — BASIC METABOLIC PANEL
ANION GAP: 6 (ref 5–15)
BUN: 19 mg/dL (ref 6–20)
CALCIUM: 8.8 mg/dL — AB (ref 8.9–10.3)
CHLORIDE: 100 mmol/L — AB (ref 101–111)
CO2: 27 mmol/L (ref 22–32)
CREATININE: 1.61 mg/dL — AB (ref 0.61–1.24)
GFR calc non Af Amer: 43 mL/min — ABNORMAL LOW (ref >60)
GFR, EST AFRICAN AMERICAN: 49 mL/min — AB (ref >60)
Glucose, Bld: 115 mg/dL — ABNORMAL HIGH (ref 65–99)
Potassium: 3.9 mmol/L (ref 3.5–5.1)
SODIUM: 133 mmol/L — AB (ref 135–145)

## 2016-08-31 LAB — CBC
HEMATOCRIT: 37.1 % — AB (ref 39.0–52.0)
Hemoglobin: 11.7 g/dL — ABNORMAL LOW (ref 13.0–17.0)
MCH: 26.4 pg (ref 26.0–34.0)
MCHC: 31.5 g/dL (ref 30.0–36.0)
MCV: 83.6 fL (ref 78.0–100.0)
Platelets: 127 10*3/uL — ABNORMAL LOW (ref 150–400)
RBC: 4.44 MIL/uL (ref 4.22–5.81)
RDW: 18 % — AB (ref 11.5–15.5)
WBC: 6.3 10*3/uL (ref 4.0–10.5)

## 2016-08-31 LAB — COOXEMETRY PANEL
CARBOXYHEMOGLOBIN: 1.5 % (ref 0.5–1.5)
METHEMOGLOBIN: 0.8 % (ref 0.0–1.5)
O2 Saturation: 80.4 %
Total hemoglobin: 11.9 g/dL — ABNORMAL LOW (ref 12.0–16.0)

## 2016-08-31 LAB — HEPARIN LEVEL (UNFRACTIONATED): Heparin Unfractionated: 0.42 IU/mL (ref 0.30–0.70)

## 2016-08-31 MED ORDER — SODIUM CHLORIDE 0.9% FLUSH
3.0000 mL | Freq: Two times a day (BID) | INTRAVENOUS | Status: DC
Start: 2016-08-31 — End: 2016-09-01
  Administered 2016-08-31: 3 mL via INTRAVENOUS

## 2016-08-31 MED ORDER — LACTULOSE 10 GM/15ML PO SOLN
20.0000 g | Freq: Every day | ORAL | Status: DC | PRN
  Administered 2016-08-31: 20 g via ORAL
  Filled 2016-08-31: qty 30

## 2016-08-31 MED ORDER — SODIUM CHLORIDE 0.9 % IV SOLN
250.0000 mL | INTRAVENOUS | Status: DC | PRN
Start: 2016-08-31 — End: 2016-09-01

## 2016-08-31 MED ORDER — POTASSIUM CHLORIDE 10 MEQ/50ML IV SOLN
10.0000 meq | INTRAVENOUS | Status: AC
  Administered 2016-09-01 (×2): 10 meq via INTRAVENOUS
  Filled 2016-08-31 (×2): qty 50

## 2016-08-31 MED ORDER — SODIUM CHLORIDE 0.9 % IV SOLN
INTRAVENOUS | Status: DC
  Administered 2016-09-01: 10 mL/h via INTRAVENOUS

## 2016-08-31 MED ORDER — FUROSEMIDE 40 MG PO TABS
60.0000 mg | ORAL_TABLET | Freq: Two times a day (BID) | ORAL | Status: DC
  Administered 2016-08-31 – 2016-09-06 (×13): 60 mg via ORAL
  Filled 2016-08-31 (×13): qty 1

## 2016-08-31 MED ORDER — SODIUM CHLORIDE 0.9% FLUSH: 3.0000 mL | INTRAVENOUS | Status: DC | PRN

## 2016-08-31 MED ORDER — ALBUTEROL SULFATE (2.5 MG/3ML) 0.083% IN NEBU
2.5000 mg | INHALATION_SOLUTION | Freq: Once | RESPIRATORY_TRACT | Status: AC
  Administered 2016-08-31: 2.5 mg via RESPIRATORY_TRACT

## 2016-08-31 MED ORDER — POTASSIUM CHLORIDE CRYS ER 20 MEQ PO TBCR
20.0000 meq | EXTENDED_RELEASE_TABLET | Freq: Every day | ORAL | Status: DC
  Administered 2016-08-31: 20 meq via ORAL
  Filled 2016-08-31: qty 1

## 2016-08-31 MED ORDER — ASPIRIN 81 MG PO CHEW
81.0000 mg | CHEWABLE_TABLET | ORAL | Status: AC
  Administered 2016-09-01: 81 mg via ORAL
  Filled 2016-08-31 (×2): qty 1

## 2016-08-31 NOTE — Progress Notes (Signed)
ANTICOAGULATION CONSULT NOTE - Follow-up Consult  Pharmacy Consult for Heparin Indication: atrial fibrillation   Assessment: 65yom on apixaban for afib, now transitioning to heparin. Last apixaban dose 10/13 am. Heparin level accurate and at goal. Scheduled for pericardiocentesis tomorrow and plan for TAVR next week. No issues reported by RN.  Goal of Therapy:  Heparin level 0.3-0.7 units/ml Monitor platelets by anticoagulation protocol: Yes   Plan:  1) Continue heparin at 1450 units/hr 2) Daily heparin level, CBC 3) Follow after procedure to resume heparin  Sherron Monday, PharmD Clinical Pharmacy Resident Pager: [phone] 08/31/16 2:24 PM  Patient Measurements: Height: 5\' 8"  (172.7 cm) Weight: 189 lb 6.4 oz (85.9 kg) IBW/kg (Calculated) : 68.4 Heparin Dosing Weight: 86.8kg  Vital Signs: Temp: 97.9 F (36.6 C) (10/19 0430) Temp Source: Oral (10/19 0755) BP: 104/90 (10/19 0755) Pulse Rate: 96 (10/19 0755)  Labs:  Recent Labs  08/29/16 0430 08/30/16 0420 08/30/16 0431 08/30/16 1305 08/31/16 0530  HGB 11.4* 11.9*  --   --  11.7*  HCT 37.7* 38.7*  --   --  37.1*  PLT 141* 122*  --   --  127*  LABPROT 16.0*  --   --   --   --   INR 1.27  --   --   --   --   HEPARINUNFRC 0.61  --  0.48  --  0.42  CREATININE 1.66* 1.91*  --  1.73* 1.61*    Estimated Creatinine Clearance: 47.5 mL/min (by C-G formula based on SCr of 1.61 mg/dL (H)).

## 2016-08-31 NOTE — Progress Notes (Signed)
CARDIAC REHAB PHASE I   PRE:  Rate/Rhythm: 102 afib    BP: sitting 126/93    SaO2: 95 RA  MODE:  Ambulation: 400 ft   POST:  Rate/Rhythm: 137 afib    BP: sitting 129/113, recheck 137/121     SaO2: 90-92 RA  Pt with slow pace today, more rest stops. He denies being SOB although seems SOB to me. Blames his sx on the potassium pill he took (sts this is a chronic problem for him with pill form). On return to room, BP elevated, esp diastolic, with machine. There was not a manual cuff in room. Notified RN. Will continue to follow. [id number]  Harriet Masson CES, ACSM 08/31/2016 11:39 AM

## 2016-08-31 NOTE — Progress Notes (Signed)
Valve Team Note:   I reviewed his valve issue and his pericardial effusion today. Will plan pericardiocentesis tomorrow. NPO at midnight. Will try to arrange cardiac CT tomorrow and CTA chest/abd/pelvis over the weekend if renal function tolerates.   Verne Carrow 08/31/2016  12:07 PM

## 2016-08-31 NOTE — Progress Notes (Signed)
Patient ID: Lucas Munoz, male   DOB: [birth year], 65 y.o.   MRN: [id number]    SUBJECTIVE: PICC placed yesterday, initial co-ox 47%.  He was started on milrinone.   Milrinone increased to 0.375 mcg 08/29/16 for low mixed venous sat on RHC (CI 1.9).  Todays CO-OX 80.4%.   Feeling OK this morning. Didn't sleep very well. Denies SOB.   I/O positive 500 cc yesterday. Weight up 3 lbs.  Creatinine back down slightly 1.6>1.9>1.6.   Being planned for TAVR, possibly next week. Pending Cardiac CT and CTA chest/abdomen/Pelvis per Dr. Clifton Munoz. Likely pericardiocentesis tomorrow if stable.   RHC/LHC (08/29/16) Left Main  Mild ostial narrowing.  Left Anterior Descending  30% proximal LAD stenosis. 60% distal LAD stenosis. Moderate D1 without significant disease. Small to moderate D2 with long 70% ostial stenosis.  Left Circumflex  50% proximal LCx stenosis at OM1. Moderate OM1 with 30% ostial stenosis.  Right Coronary Artery  40% proximal and 40% mid RCA stenosis.    RA mean 19 RV 65/15 PA 69/34, mean 51 PCWP mean 43 (not sure we got an accurate PCWP).  AO 106/87 Oxygen saturations: PA 46% AO 92% Cardiac Output (Fick) 3.8  Cardiac Index (Fick) 1.9  Echo (10/14): EF 15%, severe AS, moderately decreased RV systolic function, PASP 65 mmHg, moderate to large pericardial effusion without tamponade.   Scheduled Meds: . aspirin  81 mg Oral Daily  . atorvastatin  40 mg Oral q1800  . digoxin  0.125 mg Oral Daily  . famotidine  10 mg Oral BID  . polyethylene glycol  17 g Oral Daily  . sodium chloride flush  10-40 mL Intracatheter Q12H  . sodium chloride flush  3 mL Intravenous Q12H  . sodium chloride flush  3 mL Intravenous Q12H  . sodium chloride flush  3 mL Intravenous Q12H  . spironolactone  25 mg Oral Daily   Continuous Infusions: . amiodarone 30 mg/hr (08/31/16 0540)  . heparin 1,450 Units/hr (08/31/16 0756)  . milrinone 0.375 mcg/kg/min (08/31/16 0033)  . norepinephrine (LEVOPHED)  Adult infusion     PRN Meds:.sodium chloride, sodium chloride, sodium chloride, acetaminophen, docusate sodium, ondansetron (ZOFRAN) IV, sodium chloride flush, sodium chloride flush, sodium chloride flush, sodium chloride flush   Vitals:   08/30/16 2012 08/31/16 0001 08/31/16 0430 08/31/16 0755  BP: 92/81   104/90  Pulse: (!) 121   96  Resp: (!) 25 18  20   Temp: 98.1 F (36.7 C) 98.7 F (37.1 C) 97.9 F (36.6 C)   TempSrc: Oral Oral Oral Oral  SpO2: 95% 94%  92%  Weight:   189 lb 6.4 oz (85.9 kg)   Height:        Intake/Output Summary (Last 24 hours) at 08/31/16 0910 Last data filed at 08/31/16 0700  Gross per 24 hour  Intake           1285.5 ml  Output              920 ml  Net            365.5 ml    LABS: Basic Metabolic Panel:  Recent Labs  40/98/11 1305 08/31/16 0530  NA 134* 133*  K 3.4* 3.9  CL 97* 100*  CO2 30 27  GLUCOSE 201* 115*  BUN 21* 19  CREATININE 1.73* 1.61*  CALCIUM 8.7* 8.8*   Liver Function Tests:  Recent Labs  08/30/16 0420  AST 36  ALT 80*  ALKPHOS 79  BILITOT 2.4*  PROT  6.3*  ALBUMIN 2.8*   No results for input(s): LIPASE, AMYLASE in the last 72 hours. CBC:  Recent Labs  08/30/16 0420 08/31/16 0530  WBC 6.1 6.3  HGB 11.9* 11.7*  HCT 38.7* 37.1*  MCV 85.1 83.6  PLT 122* 127*   Cardiac Enzymes: No results for input(s): CKTOTAL, CKMB, CKMBINDEX, TROPONINI in the last 72 hours. BNP: Invalid input(s): POCBNP D-Dimer: No results for input(s): DDIMER in the last 72 hours. Hemoglobin A1C: No results for input(s): HGBA1C in the last 72 hours. Fasting Lipid Panel: No results for input(s): CHOL, HDL, LDLCALC, TRIG, CHOLHDL, LDLDIRECT in the last 72 hours. Thyroid Function Tests: No results for input(s): TSH, T4TOTAL, T3FREE, THYROIDAB in the last 72 hours.  Invalid input(s): FREET3 Anemia Panel: No results for input(s): VITAMINB12, FOLATE, FERRITIN, TIBC, IRON, RETICCTPCT in the last 72 hours.  RADIOLOGY: Dg Chest Port 1  View  Result Date: 08/26/2016 CLINICAL DATA:  65 year old male with a history of shortness of breath EXAM: PORTABLE CHEST 1 VIEW COMPARISON:  08/25/2016, CT chest 05/01/2016 FINDINGS: Compare to the prior plain film there is unchanged size of the cardiac silhouette. Calcifications of the aortic arch. Interval placement of right upper extremity PICC with the tip appearing to terminate at the superior cavoatrial junction. Increasing opacity at the right base with thickening of the minor fissure, obscuration the right hemidiaphragm and right heart border. Interlobular septal thickening. IMPRESSION: Evidence of CHF with increasing right-sided pleural effusion/atelectasis. Unchanged cardiac silhouette in this patient with a history of pericardial effusion imaged on prior CT 05/01/2016. Interval placement of right upper extremity PICC. Aortic atherosclerosis. Signed, Lucas Munoz. Lucas Ave, DO [facility] Radiology Electronically Signed   By: Lucas Munoz D.O.   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 Dilated cardiomyopathy. Acute kidney injury. EXAM: PORTABLE CHEST 1 VIEW COMPARISON:  None. FINDINGS: Marked cardiac enlargement noted. Diffuse interstitial infiltrates consistent with mild interstitial edema. Small right pleural effusion also seen with right basilar atelectasis. IMPRESSION: Mild congestive heart failure, with small right pleural effusion and right basilar atelectasis. Electronically Signed   By: Lucas Munoz M.D.   On: 08/25/2016 15:14    PHYSICAL EXAM General: NAD Neck: JVP 8-9 no thyromegaly or thyroid nodule.  Lungs: Clear  CV: Nondisplaced PMI.  Heart irregular S1/S2, no S3/S4, 3/6 crescendo-decrescendo murmur RUSB. Trace-1+ ankle edema.   Abdomen: Soft, NT, ND, no HSM. No bruits or masses. +BS  Neurologic: Alert and oriented x 3.  Psych: Normal  affect. Extremities: No clubbing or cyanosis. RUE PICC  TELEMETRY: Reviewed, A fib 100-110s   ASSESSMENT AND PLAN: 65 y/o ?with a h/o severe AS, LV dysfxn, HTN, noncompliance, thyroid nodules, pericardial effusion, cholelithiasis, and persistent AF on eliquis, who presentedto clinic on 08/23/16 with volume overload and was admitted for further work up of AS and CHF.  1. Acute on chronic systolic CHF: EF 96-04% in setting of severe aortic stenosis.  May be due to severe aortic stenosis given lack of flow-limiting CAD.  This admission, he was initially hypotensive with SBP in 80s though BP has improved to 100s-110s range.  He is also markedly volume overloaded. PICC was placed, low output confirmed by co-ox 47%.  Milrinone 0.25 started then increased to 0.375, co-ox 62% this morning.  Creatinine up today post cath 1.9.  UOP improved--> Negative 2.9 liters.   CVP 7-8 today.  - Restart  po lasix at 60 mg po BID (was on 40 mg BID prior to admit).  - CO2 33. Now off diamox. - Continue milrinone 0.375 mcg/kg/min, likely will continue through TAVR.   - K 3.9, will replace IV (does not tolerate po K).  - Continue spiro 25 mg daily.   - Continue digoxin.  2. Aortic stenosis: Severe AS.  Will need AVR, likely best suited for TAVR.   -  Dr. Clifton JamesMcAlhany has seen. Plan for imaging this week, with Pericardiocentesis Friday 09/01/16. Will continue with TAVR workup. -  Dr. Cornelius Moraswen following as well.  3. AKI on CKD stage III: Baseline creatinine 1.7-1.8 - Back down to 1.6 with holding diuretics yesterday.  - Continue to follow closely with resumption of po lasix.  4. Atrial fibrillation: Persistent, with RVR this admission.  He has been in atrial fibrillation at least since 6/17.  No prior DCCV.  Not sure how long prior to 6/17 he was in atrial fibrillation.  - Will use amiodarone gtt for now for rate control, HR now in 100s at rest with milrinone running.  - Continue heparin gtt (was on apixaban at home).  -  Continue digoxin. - Per TAVR team would not DCCV prior to TAVR unless rate becomes an issue.  5. Pericardial effusion: Moderate to large, no tamponade.   - Per Dr. Clifton JamesMcAlhany, plan for pericardiocentesis tomorrow.  6. Cirrhosis:  - Noted on prior abdominal CT, LFTs improved with diuresis.  7. Noncompliance:  - History of medical noncompliance. Overall has low trust in the medical system in general. 8. CAD:  - LHC with moderate coronary disease, nothing flow-limiting.  - Continue statin and 81 mg aspirin.   Undergoing TAVR work up per McAlhany/Owen.  We will continue to follow.   Mariam DollarMichael Andrew Tillery  PA-C  08/31/2016 9:10 AM   Patient seen with PA, agree with the above note.  Stable today.  Creatinine down, CVP and co-ox optimized, remains in atrial fibrillation.  Plan for pericardiocentesis tomorrow by Dr Lucas JamesMcAlhany.  Will need CTA chest for pre-TAVR tomorrow and will do CTA abdomen/pelvis with runoff on Sunday to separate out contrast boluses.   Marca AnconaDalton Zainab Crumrine 08/31/2016 1:15 PM

## 2016-08-31 NOTE — Care Management Important Message (Signed)
Important Message  Patient Details  Name: Lucas Munoz MRN: [id number] Date of Birth: Jan 23, 1951   Medicare Important Message Given:  Yes    Lucas Munoz Abena 08/31/2016, 10:46 AM

## 2016-09-01 ENCOUNTER — Other Ambulatory Visit: Payer: Self-pay

## 2016-09-01 ENCOUNTER — Encounter (HOSPITAL_COMMUNITY): Payer: Self-pay | Admitting: Radiology

## 2016-09-01 ENCOUNTER — Encounter (HOSPITAL_COMMUNITY)
Admission: AD | Disposition: A | Discharge status: Home Health | Payer: Self-pay | Source: Ambulatory Visit | Attending: Cardiovascular Disease

## 2016-09-01 ENCOUNTER — Inpatient Hospital Stay (HOSPITAL_COMMUNITY): Payer: Commercial Managed Care - HMO

## 2016-09-01 DIAGNOSIS — I509 Heart failure, unspecified: Secondary | ICD-10-CM

## 2016-09-01 DIAGNOSIS — I350 Nonrheumatic aortic (valve) stenosis: Secondary | ICD-10-CM

## 2016-09-01 HISTORY — PX: CARDIAC CATHETERIZATION: SHX172

## 2016-09-01 LAB — BASIC METABOLIC PANEL
ANION GAP: 8 (ref 5–15)
BUN: 13 mg/dL (ref 6–20)
CALCIUM: 8.8 mg/dL — AB (ref 8.9–10.3)
CO2: 26 mmol/L (ref 22–32)
Chloride: 100 mmol/L — ABNORMAL LOW (ref 101–111)
Creatinine, Ser: 1.46 mg/dL — ABNORMAL HIGH (ref 0.61–1.24)
GFR calc Af Amer: 56 mL/min — ABNORMAL LOW (ref >60)
GFR, EST NON AFRICAN AMERICAN: 48 mL/min — AB (ref >60)
GLUCOSE: 124 mg/dL — AB (ref 65–99)
Potassium: 3.8 mmol/L (ref 3.5–5.1)
SODIUM: 134 mmol/L — AB (ref 135–145)

## 2016-09-01 LAB — COOXEMETRY PANEL
Carboxyhemoglobin: 1.6 % — ABNORMAL HIGH (ref 0.5–1.5)
METHEMOGLOBIN: 0.8 % (ref 0.0–1.5)
O2 Saturation: 77.5 %
TOTAL HEMOGLOBIN: 11.9 g/dL — AB (ref 12.0–16.0)

## 2016-09-01 LAB — GRAM STAIN

## 2016-09-01 LAB — HEPARIN LEVEL (UNFRACTIONATED): HEPARIN UNFRACTIONATED: 0.5 [IU]/mL (ref 0.30–0.70)

## 2016-09-01 LAB — CBC
HEMATOCRIT: 39.2 % (ref 39.0–52.0)
HEMOGLOBIN: 12.1 g/dL — AB (ref 13.0–17.0)
MCH: 26.1 pg (ref 26.0–34.0)
MCHC: 30.9 g/dL (ref 30.0–36.0)
MCV: 84.5 fL (ref 78.0–100.0)
Platelets: 123 10*3/uL — ABNORMAL LOW (ref 150–400)
RBC: 4.64 MIL/uL (ref 4.22–5.81)
RDW: 18 % — ABNORMAL HIGH (ref 11.5–15.5)
WBC: 6.3 10*3/uL (ref 4.0–10.5)

## 2016-09-01 LAB — BODY FLUID CELL COUNT WITH DIFFERENTIAL
Eos, Fluid: 0 %
LYMPHS FL: 88 %
MONOCYTE-MACROPHAGE-SEROUS FLUID: 12 % — AB (ref 50–90)
NEUTROPHIL FLUID: 0 % (ref 0–25)
Total Nucleated Cell Count, Fluid: 335 cu mm (ref 0–1000)

## 2016-09-01 SURGERY — PERICARDIOCENTESIS

## 2016-09-01 MED ORDER — ALUM & MAG HYDROXIDE-SIMETH 200-200-20 MG/5ML PO SUSP
15.0000 mL | ORAL | Status: DC | PRN
  Administered 2016-09-01: 15 mL via ORAL
  Filled 2016-09-01: qty 30

## 2016-09-01 MED ORDER — OXYCODONE-ACETAMINOPHEN 5-325 MG PO TABS
1.0000 | ORAL_TABLET | ORAL | Status: DC | PRN
  Administered 2016-09-01 – 2016-09-11 (×7): 1 via ORAL
  Filled 2016-09-01 (×8): qty 1

## 2016-09-01 MED ORDER — IOPAMIDOL (ISOVUE-370) INJECTION 76%
INTRAVENOUS | Status: AC
  Administered 2016-09-01: 80 mL
  Filled 2016-09-01: qty 100

## 2016-09-01 MED ORDER — SODIUM CHLORIDE 0.9% FLUSH: 3.0000 mL | INTRAVENOUS | Status: DC | PRN

## 2016-09-01 MED ORDER — FENTANYL CITRATE (PF) 100 MCG/2ML IJ SOLN
INTRAMUSCULAR | Status: DC | PRN
  Administered 2016-09-01: 25 ug via INTRAVENOUS

## 2016-09-01 MED ORDER — ASPIRIN 81 MG PO CHEW
81.0000 mg | CHEWABLE_TABLET | Freq: Every day | ORAL | Status: DC
  Administered 2016-09-02 – 2016-09-12 (×11): 81 mg via ORAL
  Filled 2016-09-01 (×11): qty 1

## 2016-09-01 MED ORDER — MIDAZOLAM HCL 2 MG/2ML IJ SOLN
INTRAMUSCULAR | Status: DC | PRN
  Administered 2016-09-01: 1 mg via INTRAVENOUS

## 2016-09-01 MED ORDER — SODIUM CHLORIDE 0.9% FLUSH: 3.0000 mL | Freq: Two times a day (BID) | INTRAVENOUS | Status: DC

## 2016-09-01 MED ORDER — HEPARIN (PORCINE) IN NACL 2-0.9 UNIT/ML-% IJ SOLN
INTRAMUSCULAR | Status: AC
  Filled 2016-09-01: qty 500

## 2016-09-01 MED ORDER — SODIUM CHLORIDE 0.9 % IV SOLN: 250.0000 mL | INTRAVENOUS | Status: DC | PRN

## 2016-09-01 MED ORDER — METOPROLOL TARTRATE 5 MG/5ML IV SOLN
5.0000 mg | Freq: Once | INTRAVENOUS | Status: AC
Start: 2016-09-01 — End: 2016-09-01
  Administered 2016-09-01: 5 mg via INTRAVENOUS

## 2016-09-01 MED ORDER — MIDAZOLAM HCL 2 MG/2ML IJ SOLN
INTRAMUSCULAR | Status: AC
  Filled 2016-09-01: qty 2

## 2016-09-01 MED ORDER — HEPARIN (PORCINE) IN NACL 2-0.9 UNIT/ML-% IJ SOLN
INTRAMUSCULAR | Status: DC | PRN
Start: 2016-09-01 — End: 2016-09-01
  Administered 2016-09-01: 500 mL

## 2016-09-01 MED ORDER — FENTANYL CITRATE (PF) 100 MCG/2ML IJ SOLN
INTRAMUSCULAR | Status: AC
  Filled 2016-09-01: qty 2

## 2016-09-01 MED ORDER — LIDOCAINE HCL (PF) 1 % IJ SOLN
INTRAMUSCULAR | Status: AC
  Filled 2016-09-01: qty 30

## 2016-09-01 MED ORDER — METOPROLOL TARTRATE 5 MG/5ML IV SOLN
INTRAVENOUS | Status: AC
  Filled 2016-09-01: qty 5

## 2016-09-01 MED ORDER — LIDOCAINE HCL (PF) 1 % IJ SOLN
INTRAMUSCULAR | Status: DC | PRN
  Administered 2016-09-01: 10 mL

## 2016-09-01 SURGICAL SUPPLY — 3 items
EVACUATOR 1/8 PVC DRAIN (DRAIN) ×2 IMPLANT
PACK CARDIAC CATHETERIZATION (CUSTOM PROCEDURE TRAY) ×2 IMPLANT
PERIVAC PERICARDIOCENTESIS 8.3 (TRAY / TRAY PROCEDURE) ×2 IMPLANT

## 2016-09-01 NOTE — Interval H&P Note (Signed)
History and Physical Interval Note:  09/01/2016 1:58 PM  Lucas Munoz  has presented today for pericardiocentesis with the diagnosis of pericardial affusion  The various methods of treatment have been discussed with the patient and family. After consideration of risks, benefits and other options for treatment, the patient has consented to  Procedure(s): Pericardiocentesis (N/A) as a surgical intervention .  The patient's history has been reviewed, patient examined, no change in status, stable for surgery.  I have reviewed the patient's chart and labs.  Questions were answered to the patient's satisfaction.    Verne Carrow

## 2016-09-01 NOTE — Evaluation (Signed)
Physical Therapy Evaluation- PreTAVR assessment/ Discharge Patient Details Name: Lucas Munoz MRN: [id number] DOB: Oct 31, 1951 Today's Date: 09/01/2016   History of Present Illness  65 yo admitted with CHF and AFib with consideration for TAVR. PMHx: CHF, aortic stenosis, cirrhosis  Clinical Impression  Pt moving well and pleasant. Pt works part time at [employer], lives alone and has been self-sufficient to this point. Pt reports no falls, no need for DME and that his only deficit is becoming fatigued with activity. Pt without further therapy needs at this time as he is at his baseline and agreeable to no need. Will sign off with pt aware and agreeable.  PreTAVR assessment  Pre: HR 105, sats 96% on RA, BP 109/86 Post: HR 119, sats 93% on RA, BP 123/96  Clinical frailty of 4 5 meter walk tests: 6.0, 5.23, 5.6 = average 5.61 6 min walk test = 635 ft- HR 130 with gait Borg dyspnea with gait 3     Follow Up Recommendations No PT follow up    Equipment Recommendations  None recommended by PT    Recommendations for Other Services       Precautions / Restrictions Precautions Precautions: None      Mobility  Bed Mobility Overal bed mobility: Modified Independent                Transfers Overall transfer level: Modified independent                  Ambulation/Gait Ambulation/Gait assistance: Modified independent (Device/Increase time) Ambulation Distance (Feet): 800 Feet Assistive device: None Gait Pattern/deviations: WFL(Within Functional Limits)   Gait velocity interpretation: at or above normal speed for age/gender    Stairs            Wheelchair Mobility    Modified Rankin (Stroke Patients Only)       Balance Overall balance assessment: No apparent balance deficits (not formally assessed)                                           Pertinent Vitals/Pain Pain Assessment: No/denies pain    Home Living Family/patient expects  to be discharged to:: Private residence Living Arrangements: Alone   Type of Home: House Home Access: Level entry     Home Layout: One level Home Equipment: None      Prior Function Level of Independence: Independent         Comments: pt works part time at [employer]        Extremity/Trunk Assessment   Upper Extremity Assessment: Overall WFL for tasks assessed           Lower Extremity Assessment: Overall WFL for tasks assessed      Cervical / Trunk Assessment: Normal  Communication   Communication: No difficulties  Cognition Arousal/Alertness: Awake/alert Behavior During Therapy: WFL for tasks assessed/performed Overall Cognitive Status: Within Functional Limits for tasks assessed                      General Comments      Exercises     Assessment/Plan    PT Assessment Patent does not need any further PT services  PT Problem List            PT Treatment Interventions      PT Goals (Current goals can be found  in the Care Plan section)  Acute Rehab PT Goals PT Goal Formulation: All assessment and education complete, DC therapy    Frequency     Barriers to discharge        Co-evaluation               End of Session   Activity Tolerance: Patient tolerated treatment well Patient left: in chair;with call bell/phone within reach Nurse Communication: Mobility status         Time: 1136-1203 PT Time Calculation (min) (ACUTE ONLY): 27 min   Charges:   PT Evaluation $PT Eval Moderate Complexity: 1 Procedure PT Treatments $Gait Training: 8-22 mins   PT G CodesDelorse Lek:        Tabor, Kam Kushnir Beth 09/01/2016, 12:54 PM Delaney MeigsMaija Tabor Lynee Rosenbach, PT [phone]

## 2016-09-01 NOTE — Progress Notes (Signed)
ANTICOAGULATION CONSULT NOTE - Follow-up Consult  Pharmacy Consult for Heparin Indication: atrial fibrillation   Assessment: 65yom on apixaban for afib, now transitioning to heparin. Last apixaban dose 10/13 am. Heparin level accurate and at goal. S/p pericardiocentesis this afternoon, and plan for TAVR next week. Spoke with Dr. Clifton James, ok to restart heparin now. Heparin level (0.5) was therapeutic on 1450 units/hr this morning.   Goal of Therapy:  Heparin level 0.3-0.7 units/ml Monitor platelets by anticoagulation protocol: Yes   Plan:  1) Restart heparin at 1450 units/hr without bolus 2) Daily heparin level, CBC  Patient Measurements: Height: 5\' 8"  (172.7 cm) Weight: 183 lb (83 kg) IBW/kg (Calculated) : 68.4 Heparin Dosing Weight: 86.8kg  Vital Signs: Temp: 98.3 F (36.8 C) (10/20 1716) Temp Source: Oral (10/20 1716) BP: 111/83 (10/20 1716) Pulse Rate: 87 (10/20 1716)  Labs:  Recent Labs  08/30/16 0420 08/30/16 0431 08/30/16 1305 08/31/16 0530 09/01/16 0500  HGB 11.9*  --   --  11.7* 12.1*  HCT 38.7*  --   --  37.1* 39.2  PLT 122*  --   --  127* 123*  HEPARINUNFRC  --  0.48  --  0.42 0.50  CREATININE 1.91*  --  1.73* 1.61* 1.46*    Estimated Creatinine Clearance: 51.5 mL/min (by C-G formula based on SCr of 1.46 mg/dL (H)).

## 2016-09-01 NOTE — Progress Notes (Signed)
Labs reviewed. Discussed pericardiocentesis with the patient. He is on the schedule for 1:30 pm today. Cardiac CT later today. Would plan CTA chest/abd/pelvis per TAVR protocol over the weekend if renal function is stable. PFTs with severe obstructive airways disease.   Verne Carrow 09/01/2016 9:59 AM

## 2016-09-01 NOTE — H&P (View-Only) (Signed)
Patient ID: Lucas Munoz, male   DOB: 03/26/1951, 65 y.o.   MRN: [id number]    SUBJECTIVE: PICC placed yesterday, initial co-ox 47%.  He was started on milrinone.   Milrinone increased to 0.375 mcg 08/29/16 for low mixed venous sat on RHC (CI 1.9).  Today's CO-OX 77%.   Feeling OK this morning. No dyspnea.  Had BM.    I/Os negative, weight down.  Now on po Lasix.  Creatinine down to 1.46.   Being planned for TAVR, possibly next week. Pending gated CTA chest today for TAVR planning. Will also have pericardiocentesis today.   RHC/LHC (08/29/16) Left Main  Mild ostial narrowing.  Left Anterior Descending  30% proximal LAD stenosis. 60% distal LAD stenosis. Moderate D1 without significant disease. Small to moderate D2 with long 70% ostial stenosis.  Left Circumflex  50% proximal LCx stenosis at OM1. Moderate OM1 with 30% ostial stenosis.  Right Coronary Artery  40% proximal and 40% mid RCA stenosis.   RA mean 19 RV 65/15 PA 69/34, mean 51 PCWP mean 43 (not sure we got an accurate PCWP).  AO 106/87 Oxygen saturations: PA 46% AO 92% Cardiac Output (Fick) 3.8  Cardiac Index (Fick) 1.9  Echo (10/14): EF 15%, severe AS, moderately decreased RV systolic function, PASP 65 mmHg, moderate to large pericardial effusion without tamponade.   Scheduled Meds: . [START ON 09/02/2016] aspirin  81 mg Oral Daily  . atorvastatin  40 mg Oral q1800  . digoxin  0.125 mg Oral Daily  . famotidine  10 mg Oral BID  . furosemide  60 mg Oral BID  . polyethylene glycol  17 g Oral Daily  . sodium chloride flush  10-40 mL Intracatheter Q12H  . sodium chloride flush  3 mL Intravenous Q12H  . sodium chloride flush  3 mL Intravenous Q12H  . sodium chloride flush  3 mL Intravenous Q12H  . sodium chloride flush  3 mL Intravenous Q12H  . spironolactone  25 mg Oral Daily   Continuous Infusions: . sodium chloride 10 mL/hr (09/01/16 0646)  . amiodarone 30 mg/hr (09/01/16 0453)  . heparin 1,450 Units/hr  (09/01/16 0155)  . milrinone 0.375 mcg/kg/min (09/01/16 0740)  . norepinephrine (LEVOPHED) Adult infusion     PRN Meds:.sodium chloride, sodium chloride, sodium chloride, sodium chloride, acetaminophen, docusate sodium, lactulose, ondansetron (ZOFRAN) IV, sodium chloride flush, sodium chloride flush, sodium chloride flush, sodium chloride flush, sodium chloride flush   Vitals:   08/31/16 2353 09/01/16 0422 09/01/16 0810 09/01/16 1203  BP:   99/74 109/86  Pulse:   91 97  Resp:   16 17  Temp: 97.8 F (36.6 C) 98.6 F (37 C) 97.5 F (36.4 C) 97.6 F (36.4 C)  TempSrc: Oral Oral Oral Oral  SpO2:   94% 100%  Weight:  83 kg (183 lb)    Height:        Intake/Output Summary (Last 24 hours) at 09/01/16 1210 Last data filed at 09/01/16 1130  Gross per 24 hour  Intake          1002.64 ml  Output             4450 ml  Net         -3447.36 ml    LABS: Basic Metabolic Panel:  Recent Labs  78/29/5610/19/17 0530 09/01/16 0500  NA 133* 134*  K 3.9 3.8  CL 100* 100*  CO2 27 26  GLUCOSE 115* 124*  BUN 19 13  CREATININE 1.61* 1.46*  CALCIUM 8.8*  8.8*   Liver Function Tests:  Recent Labs  08/30/16 0420  AST 36  ALT 80*  ALKPHOS 79  BILITOT 2.4*  PROT 6.3*  ALBUMIN 2.8*   No results for input(s): LIPASE, AMYLASE in the last 72 hours. CBC:  Recent Labs  08/31/16 0530 09/01/16 0500  WBC 6.3 6.3  HGB 11.7* 12.1*  HCT 37.1* 39.2  MCV 83.6 84.5  PLT 127* 123*   Cardiac Enzymes: No results for input(s): CKTOTAL, CKMB, CKMBINDEX, TROPONINI in the last 72 hours. BNP: Invalid input(s): POCBNP D-Dimer: No results for input(s): DDIMER in the last 72 hours. Hemoglobin A1C: No results for input(s): HGBA1C in the last 72 hours. Fasting Lipid Panel: No results for input(s): CHOL, HDL, LDLCALC, TRIG, CHOLHDL, LDLDIRECT in the last 72 hours. Thyroid Function Tests: No results for input(s): TSH, T4TOTAL, T3FREE, THYROIDAB in the last 72 hours.  Invalid input(s): FREET3 Anemia  Panel: No results for input(s): VITAMINB12, FOLATE, FERRITIN, TIBC, IRON, RETICCTPCT in the last 72 hours.  RADIOLOGY: Dg Chest Port 1 View  Result Date: 08/26/2016 CLINICAL DATA:  65 year old male with a history of shortness of breath EXAM: PORTABLE CHEST 1 VIEW COMPARISON:  08/25/2016, CT chest 05/01/2016 FINDINGS: Compare to the prior plain film there is unchanged size of the cardiac silhouette. Calcifications of the aortic arch. Interval placement of right upper extremity PICC with the tip appearing to terminate at the superior cavoatrial junction. Increasing opacity at the right base with thickening of the minor fissure, obscuration the right hemidiaphragm and right heart border. Interlobular septal thickening. IMPRESSION: Evidence of CHF with increasing right-sided pleural effusion/atelectasis. Unchanged cardiac silhouette in this patient with a history of pericardial effusion imaged on prior CT 05/01/2016. Interval placement of right upper extremity PICC. Aortic atherosclerosis. Signed, Yvone Neu. Lucas Ave, DO [facility] Radiology Electronically Signed   By: Gilmer Mor D.O.   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 Dilated cardiomyopathy. Acute kidney injury. EXAM: PORTABLE CHEST 1 VIEW COMPARISON:  None. FINDINGS: Marked cardiac enlargement noted. Diffuse interstitial infiltrates consistent with mild interstitial edema. Small right pleural effusion also seen with right basilar atelectasis. IMPRESSION: Mild congestive heart failure, with small right pleural effusion and right basilar atelectasis. Electronically Signed   By: Myles Rosenthal M.D.   On: 08/25/2016 15:14    PHYSICAL EXAM General: NAD Neck: JVP 7-8 no thyromegaly or thyroid nodule.  Lungs: Clear  CV: Nondisplaced PMI.  Heart mildly tachy, irregular S1/S2, no S3/S4, 3/6  crescendo-decrescendo murmur RUSB. Trace-1+ ankle edema.   Abdomen: Soft, NT, ND, no HSM. No bruits or masses. +BS  Neurologic: Alert and oriented x 3.  Psych: Normal affect. Extremities: No clubbing or cyanosis. RUE PICC  TELEMETRY: Reviewed, A fib 100-110s   ASSESSMENT AND PLAN: 65 y/o ?with a h/o severe AS, LV dysfxn, HTN, noncompliance, thyroid nodules, pericardial effusion, cholelithiasis, and persistent AF on eliquis, who presentedto clinic on 08/23/16 with volume overload and was admitted for further work up of AS and CHF.  1. Acute on chronic systolic CHF: EF 37-34% in setting of severe aortic stenosis.  May be due to severe aortic stenosis given lack of flow-limiting CAD.  This admission, he was initially hypotensive with SBP in 80s though BP has improved to 100s-110s range.  He is also markedly volume overloaded. PICC was placed, low output confirmed by co-ox 47%.  Milrinone 0.25 started then  increased to 0.375, co-ox 77% this morning.  Creatinine 1.46 today.  CVP 9-10.  Now po po Lasix but good diuresis yesterday. - Continue Lasix 60 mg po bid. - Continue milrinone 0.375 mcg/kg/min, likely will continue through TAVR.   - Need to replace K IV given intolerance to po.  - Continue spiro 25 mg daily.   - Continue digoxin, check level Monday.  2. Aortic stenosis: Severe AS.  Will need AVR, likely best suited for TAVR.   -  Drs. McAlhany and Cornelius Moras have seen. Needs gated CTA chest today and will then need to do CTA abdomen/pelvis with runoff on Sunday (splitting the contrast load given CKD).   3. AKI on CKD stage III: Baseline creatinine 1.7-1.8.  Down to 1.46 today.  - Continue to follow closely with po Lasix and need for contrast boluses with CTs.  4. Atrial fibrillation: Persistent, with RVR this admission.  He has been in atrial fibrillation at least since 6/17.  No prior DCCV.  Not sure how long prior to 6/17 he was in atrial fibrillation.  - Will use amiodarone gtt for now for rate  control, HR now in 100s at rest with milrinone running.  - Continue heparin gtt (was on apixaban at home).  - Continue digoxin. - Plan for DCCV post-TAVR, hopefully off milrinone at that point.  5. Pericardial effusion: Moderate to large, no tamponade.   - Pericardiocentesis with Dr Clifton James today.  6. Cirrhosis: Noted on prior abdominal CT, LFTs improved with diuresis.  7. CAD:  LHC with moderate coronary disease, nothing flow-limiting.  - Continue statin and 81 mg aspirin.   Marca Ancona 09/01/2016 12:10 PM

## 2016-09-01 NOTE — Progress Notes (Signed)
Patient ID: Lucas Munoz, male   DOB: 03/26/1951, 65 y.o.   MRN: [id number]    SUBJECTIVE: PICC placed yesterday, initial co-ox 47%.  He was started on milrinone.   Milrinone increased to 0.375 mcg 08/29/16 for low mixed venous sat on RHC (CI 1.9).  Today's CO-OX 77%.   Feeling OK this morning. No dyspnea.  Had BM.    I/Os negative, weight down.  Now on po Lasix.  Creatinine down to 1.46.   Being planned for TAVR, possibly next week. Pending gated CTA chest today for TAVR planning. Will also have pericardiocentesis today.   RHC/LHC (08/29/16) Left Main  Mild ostial narrowing.  Left Anterior Descending  30% proximal LAD stenosis. 60% distal LAD stenosis. Moderate D1 without significant disease. Small to moderate D2 with long 70% ostial stenosis.  Left Circumflex  50% proximal LCx stenosis at OM1. Moderate OM1 with 30% ostial stenosis.  Right Coronary Artery  40% proximal and 40% mid RCA stenosis.   RA mean 19 RV 65/15 PA 69/34, mean 51 PCWP mean 43 (not sure we got an accurate PCWP).  AO 106/87 Oxygen saturations: PA 46% AO 92% Cardiac Output (Fick) 3.8  Cardiac Index (Fick) 1.9  Echo (10/14): EF 15%, severe AS, moderately decreased RV systolic function, PASP 65 mmHg, moderate to large pericardial effusion without tamponade.   Scheduled Meds: . [START ON 09/02/2016] aspirin  81 mg Oral Daily  . atorvastatin  40 mg Oral q1800  . digoxin  0.125 mg Oral Daily  . famotidine  10 mg Oral BID  . furosemide  60 mg Oral BID  . polyethylene glycol  17 g Oral Daily  . sodium chloride flush  10-40 mL Intracatheter Q12H  . sodium chloride flush  3 mL Intravenous Q12H  . sodium chloride flush  3 mL Intravenous Q12H  . sodium chloride flush  3 mL Intravenous Q12H  . sodium chloride flush  3 mL Intravenous Q12H  . spironolactone  25 mg Oral Daily   Continuous Infusions: . sodium chloride 10 mL/hr (09/01/16 0646)  . amiodarone 30 mg/hr (09/01/16 0453)  . heparin 1,450 Units/hr  (09/01/16 0155)  . milrinone 0.375 mcg/kg/min (09/01/16 0740)  . norepinephrine (LEVOPHED) Adult infusion     PRN Meds:.sodium chloride, sodium chloride, sodium chloride, sodium chloride, acetaminophen, docusate sodium, lactulose, ondansetron (ZOFRAN) IV, sodium chloride flush, sodium chloride flush, sodium chloride flush, sodium chloride flush, sodium chloride flush   Vitals:   08/31/16 2353 09/01/16 0422 09/01/16 0810 09/01/16 1203  BP:   99/74 109/86  Pulse:   91 97  Resp:   16 17  Temp: 97.8 F (36.6 C) 98.6 F (37 C) 97.5 F (36.4 C) 97.6 F (36.4 C)  TempSrc: Oral Oral Oral Oral  SpO2:   94% 100%  Weight:  83 kg (183 lb)    Height:        Intake/Output Summary (Last 24 hours) at 09/01/16 1210 Last data filed at 09/01/16 1130  Gross per 24 hour  Intake          1002.64 ml  Output             4450 ml  Net         -3447.36 ml    LABS: Basic Metabolic Panel:  Recent Labs  78/29/5610/19/17 0530 09/01/16 0500  NA 133* 134*  K 3.9 3.8  CL 100* 100*  CO2 27 26  GLUCOSE 115* 124*  BUN 19 13  CREATININE 1.61* 1.46*  CALCIUM 8.8*  8.8*   Liver Function Tests:  Recent Labs  08/30/16 0420  AST 36  ALT 80*  ALKPHOS 79  BILITOT 2.4*  PROT 6.3*  ALBUMIN 2.8*   No results for input(s): LIPASE, AMYLASE in the last 72 hours. CBC:  Recent Labs  08/31/16 0530 09/01/16 0500  WBC 6.3 6.3  HGB 11.7* 12.1*  HCT 37.1* 39.2  MCV 83.6 84.5  PLT 127* 123*   Cardiac Enzymes: No results for input(s): CKTOTAL, CKMB, CKMBINDEX, TROPONINI in the last 72 hours. BNP: Invalid input(s): POCBNP D-Dimer: No results for input(s): DDIMER in the last 72 hours. Hemoglobin A1C: No results for input(s): HGBA1C in the last 72 hours. Fasting Lipid Panel: No results for input(s): CHOL, HDL, LDLCALC, TRIG, CHOLHDL, LDLDIRECT in the last 72 hours. Thyroid Function Tests: No results for input(s): TSH, T4TOTAL, T3FREE, THYROIDAB in the last 72 hours.  Invalid input(s): FREET3 Anemia  Panel: No results for input(s): VITAMINB12, FOLATE, FERRITIN, TIBC, IRON, RETICCTPCT in the last 72 hours.  RADIOLOGY: Dg Chest Port 1 View  Result Date: 08/26/2016 CLINICAL DATA:  65 year old male with a history of shortness of breath EXAM: PORTABLE CHEST 1 VIEW COMPARISON:  08/25/2016, CT chest 05/01/2016 FINDINGS: Compare to the prior plain film there is unchanged size of the cardiac silhouette. Calcifications of the aortic arch. Interval placement of right upper extremity PICC with the tip appearing to terminate at the superior cavoatrial junction. Increasing opacity at the right base with thickening of the minor fissure, obscuration the right hemidiaphragm and right heart border. Interlobular septal thickening. IMPRESSION: Evidence of CHF with increasing right-sided pleural effusion/atelectasis. Unchanged cardiac silhouette in this patient with a history of pericardial effusion imaged on prior CT 05/01/2016. Interval placement of right upper extremity PICC. Aortic atherosclerosis. Signed, Yvone Neu. Loreta Ave, DO [facility] Radiology Electronically Signed   By: Gilmer Mor D.O.   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 Dilated cardiomyopathy. Acute kidney injury. EXAM: PORTABLE CHEST 1 VIEW COMPARISON:  None. FINDINGS: Marked cardiac enlargement noted. Diffuse interstitial infiltrates consistent with mild interstitial edema. Small right pleural effusion also seen with right basilar atelectasis. IMPRESSION: Mild congestive heart failure, with small right pleural effusion and right basilar atelectasis. Electronically Signed   By: Myles Rosenthal M.D.   On: 08/25/2016 15:14    PHYSICAL EXAM General: NAD Neck: JVP 7-8 no thyromegaly or thyroid nodule.  Lungs: Clear  CV: Nondisplaced PMI.  Heart mildly tachy, irregular S1/S2, no S3/S4, 3/6  crescendo-decrescendo murmur RUSB. Trace-1+ ankle edema.   Abdomen: Soft, NT, ND, no HSM. No bruits or masses. +BS  Neurologic: Alert and oriented x 3.  Psych: Normal affect. Extremities: No clubbing or cyanosis. RUE PICC  TELEMETRY: Reviewed, A fib 100-110s   ASSESSMENT AND PLAN: 65 y/o ?with a h/o severe AS, LV dysfxn, HTN, noncompliance, thyroid nodules, pericardial effusion, cholelithiasis, and persistent AF on eliquis, who presentedto clinic on 08/23/16 with volume overload and was admitted for further work up of AS and CHF.  1. Acute on chronic systolic CHF: EF 37-34% in setting of severe aortic stenosis.  May be due to severe aortic stenosis given lack of flow-limiting CAD.  This admission, he was initially hypotensive with SBP in 80s though BP has improved to 100s-110s range.  He is also markedly volume overloaded. PICC was placed, low output confirmed by co-ox 47%.  Milrinone 0.25 started then  increased to 0.375, co-ox 77% this morning.  Creatinine 1.46 today.  CVP 9-10.  Now po po Lasix but good diuresis yesterday. - Continue Lasix 60 mg po bid. - Continue milrinone 0.375 mcg/kg/min, likely will continue through TAVR.   - Need to replace K IV given intolerance to po.  - Continue spiro 25 mg daily.   - Continue digoxin, check level Monday.  2. Aortic stenosis: Severe AS.  Will need AVR, likely best suited for TAVR.   -  Drs. McAlhany and Cornelius Moras have seen. Needs gated CTA chest today and will then need to do CTA abdomen/pelvis with runoff on Sunday (splitting the contrast load given CKD).   3. AKI on CKD stage III: Baseline creatinine 1.7-1.8.  Down to 1.46 today.  - Continue to follow closely with po Lasix and need for contrast boluses with CTs.  4. Atrial fibrillation: Persistent, with RVR this admission.  He has been in atrial fibrillation at least since 6/17.  No prior DCCV.  Not sure how long prior to 6/17 he was in atrial fibrillation.  - Will use amiodarone gtt for now for rate  control, HR now in 100s at rest with milrinone running.  - Continue heparin gtt (was on apixaban at home).  - Continue digoxin. - Plan for DCCV post-TAVR, hopefully off milrinone at that point.  5. Pericardial effusion: Moderate to large, no tamponade.   - Pericardiocentesis with Dr Clifton James today.  6. Cirrhosis: Noted on prior abdominal CT, LFTs improved with diuresis.  7. CAD:  LHC with moderate coronary disease, nothing flow-limiting.  - Continue statin and 81 mg aspirin.   Marca Ancona 09/01/2016 12:10 PM

## 2016-09-01 NOTE — Progress Notes (Signed)
ANTICOAGULATION CONSULT NOTE - Follow-up Consult  Pharmacy Consult for Heparin Indication: atrial fibrillation   Assessment: 65yom on apixaban for afib, now transitioning to heparin. Last apixaban dose 10/13 am. Heparin level accurate and at goal. Scheduled for pericardiocentesis today 10/20 and plan for TAVR next week. Will follow up after pericardiocentesis on resuming heparin. No issues reported by RN.  Goal of Therapy:  Heparin level 0.3-0.7 units/ml Monitor platelets by anticoagulation protocol: Yes   Plan:  1) Continue heparin at 1450 units/hr 2) Daily heparin level, CBC 3) Follow after procedure to resume heparin  Sherron Monday, PharmD Clinical Pharmacy Resident Pager: [phone] 09/01/16 2:16 PM  Patient Measurements: Height: 5\' 8"  (172.7 cm) Weight: 183 lb (83 kg) IBW/kg (Calculated) : 68.4 Heparin Dosing Weight: 86.8kg  Vital Signs: Temp: 97.6 F (36.4 C) (10/20 1203) Temp Source: Oral (10/20 1203) BP: 123/96 (10/20 1250) Pulse Rate: 105 (10/20 1250)  Labs:  Recent Labs  08/30/16 0420 08/30/16 0431 08/30/16 1305 08/31/16 0530 09/01/16 0500  HGB 11.9*  --   --  11.7* 12.1*  HCT 38.7*  --   --  37.1* 39.2  PLT 122*  --   --  127* 123*  HEPARINUNFRC  --  0.48  --  0.42 0.50  CREATININE 1.91*  --  1.73* 1.61* 1.46*    Estimated Creatinine Clearance: 51.5 mL/min (by C-G formula based on SCr of 1.46 mg/dL (H)).

## 2016-09-02 ENCOUNTER — Encounter (HOSPITAL_COMMUNITY): Payer: Commercial Managed Care - HMO

## 2016-09-02 DIAGNOSIS — I350 Nonrheumatic aortic (valve) stenosis: Secondary | ICD-10-CM

## 2016-09-02 LAB — MISC LABCORP TEST (SEND OUT)
Labcorp test code: 100156
Labcorp test code: 100479
Labcorp test code: 19497
Labcorp test code: 19588

## 2016-09-02 LAB — COOXEMETRY PANEL
CARBOXYHEMOGLOBIN: 1.4 % (ref 0.5–1.5)
METHEMOGLOBIN: 0.9 % (ref 0.0–1.5)
O2 Saturation: 61 %
Total hemoglobin: 12.8 g/dL (ref 12.0–16.0)

## 2016-09-02 LAB — CBC
HCT: 39.3 % (ref 39.0–52.0)
HEMOGLOBIN: 12.5 g/dL — AB (ref 13.0–17.0)
MCH: 26.3 pg (ref 26.0–34.0)
MCHC: 31.8 g/dL (ref 30.0–36.0)
MCV: 82.6 fL (ref 78.0–100.0)
Platelets: 135 10*3/uL — ABNORMAL LOW (ref 150–400)
RBC: 4.76 MIL/uL (ref 4.22–5.81)
RDW: 17.9 % — ABNORMAL HIGH (ref 11.5–15.5)
WBC: 13.4 10*3/uL — ABNORMAL HIGH (ref 4.0–10.5)

## 2016-09-02 LAB — BASIC METABOLIC PANEL
ANION GAP: 8 (ref 5–15)
BUN: 15 mg/dL (ref 6–20)
CO2: 25 mmol/L (ref 22–32)
Calcium: 8.6 mg/dL — ABNORMAL LOW (ref 8.9–10.3)
Chloride: 100 mmol/L — ABNORMAL LOW (ref 101–111)
Creatinine, Ser: 1.52 mg/dL — ABNORMAL HIGH (ref 0.61–1.24)
GFR calc Af Amer: 53 mL/min — ABNORMAL LOW (ref >60)
GFR calc non Af Amer: 46 mL/min — ABNORMAL LOW (ref >60)
GLUCOSE: 158 mg/dL — AB (ref 65–99)
POTASSIUM: 4 mmol/L (ref 3.5–5.1)
Sodium: 133 mmol/L — ABNORMAL LOW (ref 135–145)

## 2016-09-02 LAB — HEPARIN LEVEL (UNFRACTIONATED): Heparin Unfractionated: 0.32 IU/mL (ref 0.30–0.70)

## 2016-09-02 NOTE — Consult Note (Signed)
[address]       [place] [address]             [phone]          CARDIOTHORACIC SURGERY CONSULTATION REPORT  PCP is [name], STEPHEN D., MD Referring Provider is Kathleene Hazel, MD Primary Cardiologist is Lennette Bihari, MD  Reason for consultation:  Severe Aortic Stenosis  HPI:  Patient is a 65 year old male with aortic stenosis, chronic combined systolic and diastolic congestive heart failure, hypertension, long-standing persistent atrial fibrillation on oral anticoagulation, chronic pulmonary embolism, cholelithiasis, cirrhosis, pericardial effusion, and stage III chronic kidney disease with been referred for surgical consultation to discuss treatment options for management of severe symptomatic aortic stenosis. Patient states that he has known he had a heart murmur for most of his life. It was originally found on his routine physical exam when he was drafted into the [employer] during the [place] [employer]. He never had any formal cardiac evaluation until approximately 2 years ago. At that time he presented with symptoms of shortness of breath and lower extremity edema.  He underwent an extensive evaluation by Dr. Tomie China in [place] and was diagnosed with severe aortic stenosis, congestive heart failure, and atrial fibrillation. He was told that he should have surgery, but the patient was reluctant and felt no confidence in his cardiologist. He ultimately decided to seek a second opinion and was evaluated by Dr. Tresa Endo last May.  At that time he was in atrial fibrillation but not on anticoagulation.  Eliquis was started and the patient was treated for congestive heart failure using diuretics.  Echocardiogram performed 04/28/2016 revealed severe aortic stenosis with peak velocity across the aortic valve measured greater than 4 m/s corresponding to mean transvalvular gradient estimated 40 mmHg.  Left ventricular systolic function was severely reduced with ejection  fraction estimated at only 20-25%.  There was a moderate sized pericardial effusion. The ascending aorta was somewhat dilated and question was raised at the time of that echocardiogram about the possibility of an aortic dissection.  The patient subsequently underwent CT angiogram of the chest which revealed aneurysmal dilatation of the ascending aorta with maximum transverse diameter 4.8 cm but no evidence for aortic dissection. The patient was noted to have a pulmonary embolus in a small segmental branch of the right lower lobe.  The patient was not seen back again in follow up until 08/15/2016.  At that time he complained of worsening symptoms of shortness of breath, lower extremity edema, and early satiety.  He was not taking his Lasix as previously prescribed and his weight was up a proximally 20 pounds in comparison to his last previous office visit.  His medications were adjusted but symptoms did not significantly improve, and he was admitted to the hospital 08/24/2016 for intravenous diuretic therapy and further management. Repeat follow-up echocardiogram revealed further deterioration patient's left ventricular systolic function with ejection fraction only 15%.  There remained a moderate to large size circumferential pericardial effusion without evidence for pericardial tamponade.  The patient was started on intravenous milrinone and aggressively diuresed. Diagnostic cardiac catheterization performed 08/29/2016 revealed moderate nonobstructive coronary artery disease with low cardiac output and mixed venous oxygen saturation with elevated filling pressures.  Milrinone was increased. The patient responded well to diuretic therapy and his weight has come down a proximally 20 pounds since admission. Cardiothoracic surgical consultation was requested. Since then he underwent pericardiocentesis yesterday yielding 750 mL of thin serous fluid.  The patient  is divorced and lives alone in [place]. He is originally  from [place], lived in [place] for a long time but moved to [place] after his divorce. He relocated back to  little over 2 years ago to be closer to his daughter. He is not currently working. He reports that his only significant physical limitation is that related to exertional shortness of breath and fatigue. He has no problems with mobility. He has a remote history of tobacco use and heavy alcohol consumption, but he quit taking and smoking many years ago. Prior to admission the patient got short of breath with any activity and breathless with speech. He could not complete a sentence without gasping for his breath. He had severe lower extremity edema, abdominal bloating, and early satiety. All of these symptoms have improved considerably during his hospitalization. He denies any history of chest pain or chest tightness either with activity or at rest. He has not had palpitations, dizzy spells, or syncope.  Past Medical History:  Diagnosis Date  . Cardiomyopathy (HCC)    a. 04/2016 Echo: EF 20-25%.  . Cataract    right side per patient  . Cholelithiasis    a. 03/2016 Abd CT: 4.1 x 3.4 cm gallstone and other tiny stones in the gb fundus-->referred to GI.  Marland Kitchen. Chronic bronchitis (HCC)   . Chronic pulmonary embolism (HCC)    a. 04/2016 CTA Chest: Sequelae of chronic pulm thromboembolism in RLL. No acute PE.  Marland Kitchen. Chronic systolic CHF (congestive heart failure) (HCC)    a. 04/2016 Echo: EF 20-25%, mod LVH, diff HK w/ anterior, anteroseptal, and apical AK, sev AS (mean grad 40 mmHg, peak grad 66 mmHg), Asc Ao diameter 45 mm - ? flap (not seen on f/u CTA), mild MR, reduced RV fxn, sev dil RA, mod TR, PASP 50 mmHg, small to mod circumferential pericardial effusion.  Marland Kitchen. GERD (gastroesophageal reflux disease)   . Heart murmur dx'd 1968  . Hepatic cirrhosis (HCC)    a. 03/2016 Abd CT: evidence for hepatic cirrhosis.  . Hypertension   . Hypertensive heart disease with heart failure (HCC)   . Lower  extremity edema    a. 04/2016 LE U/S: No DVT. Bilat greater saphenous vein incompetence.  . Multiple thyroid nodules    a. 05/2016 Thyroid U/S: Bilat nodules & cysts, largest in left thyroid lobe, 1.4 cm; solid nodule along the medial right thyroid lobe measuring 1.0 cm - rec u/s guided needle bx.  . Pericardial effusion    a. 03/2016 Abd CT: mod to large pericardial effusion; b. 04/2016 Echo: small to mod circumferential pericardial effusion.  . Persistent atrial fibrillation (HCC)    a. On eliquis (CHA2DS2VASc = 3).  . Pleural effusion on right    a. 03/2016 incidental finding on abd CT; b. 04/2016 R pl effusion noted on CTA Chest.  . Severe aortic stenosis     Past Surgical History:  Procedure Laterality Date  . BACK SURGERY    . CARDIAC CATHETERIZATION N/A 08/29/2016   Procedure: Right/Left Heart Cath and Coronary Angiography;  Surgeon: Laurey Moralealton S McLean, MD;  [facility];  Service: Cardiovascular;  Laterality: N/A;  . goiter removed  ~ 1958  . LUMBAR DISC SURGERY  1968   "herniated disc"  . TONSILLECTOMY  1960    Family History  Problem Relation Age of Onset  . Cancer Mother   . Heart disease Father   . Alcohol abuse Brother   . Cancer Brother   . Cancer  Maternal Grandmother   . Alcohol abuse Maternal Grandfather   . Alcohol abuse Paternal Grandfather     Social History   Social History  . Marital status: Divorced    Spouse name: N/A  . Number of children: N/A  . Years of education: N/A   Occupational History  . Not on file.   Social History Main Topics  . Smoking status: Former Smoker    Packs/day: 1.00    Years: 30.00    Types: Cigarettes    Quit date: 04/05/2000  . Smokeless tobacco: Never Used  . Alcohol use 0.0 oz/week     Comment: 08/24/2016 "stopped drinking in 1990"  . Drug use: No  . Sexual activity: Not Currently   Other Topics Concern  . Not on file   Social History Narrative  . No narrative on file    Prior to Admission  medications   Medication Sig Start Date End Date Taking? Authorizing Provider  apixaban (ELIQUIS) 5 MG TABS tablet Take 1 tablet (5 mg total) by mouth 2 (two) times daily. 04/05/16  Yes Lennette Bihari, MD  carvedilol (COREG) 6.25 MG tablet Take 1 tablet (6.25 mg total) by mouth 2 (two) times daily with a meal. 08/15/16  Yes Ok Anis, NP  famotidine (PEPCID) 20 MG tablet Take 20 mg by mouth daily.   Yes Historical Provider, MD  furosemide (LASIX) 80 MG tablet Take 1/2 tablet in the AM and 1/2 tablet in the evening Patient taking differently: Take 40 mg by mouth 2 (two) times daily.  08/21/16  Yes Ok Anis, NP  ibuprofen (ADVIL,MOTRIN) 200 MG tablet Take 400 mg by mouth every 6 (six) hours as needed for headache or moderate pain.   Yes Historical Provider, MD  potassium chloride SA (K-DUR,KLOR-CON) 20 MEQ tablet Take 1 tablet (20 mEq total) by mouth daily. 08/16/16  Yes Ok Anis, NP    Current Facility-Administered Medications  Medication Dose Route Frequency Provider Last Rate Last Dose  . 0.9 %  sodium chloride infusion  250 mL Intravenous PRN Laurey Morale, MD      . acetaminophen (TYLENOL) tablet 650 mg  650 mg Oral Q4H PRN Ok Anis, NP   650 mg at 09/02/16 1201  . alum & mag hydroxide-simeth (MAALOX/MYLANTA) 200-200-20 MG/5ML suspension 15 mL  15 mL Oral Q4H PRN Laurey Morale, MD   15 mL at 09/01/16 1941  . amiodarone (NEXTERONE PREMIX) 360-4.14 MG/200ML-% (1.8 mg/mL) IV infusion  30 mg/hr Intravenous Continuous Graciella Freer, PA-C 16.7 mL/hr at 09/02/16 0237 30 mg/hr at 09/02/16 0237  . aspirin chewable tablet 81 mg  81 mg Oral Daily Lennette Bihari, MD   81 mg at 09/02/16 1035  . atorvastatin (LIPITOR) tablet 40 mg  40 mg Oral q1800 Amy D Clegg, NP   40 mg at 09/01/16 1833  . digoxin (LANOXIN) tablet 0.125 mg  0.125 mg Oral Daily Laurey Morale, MD   0.125 mg at 09/02/16 1035  . docusate sodium (COLACE) capsule 100 mg  100 mg Oral Daily  PRN Laurey Morale, MD   100 mg at 08/31/16 0156  . famotidine (PEPCID) tablet 10 mg  10 mg Oral BID Laurey Morale, MD   10 mg at 09/02/16 1035  . furosemide (LASIX) tablet 60 mg  60 mg Oral BID Mariam Dollar Tillery, PA-C   60 mg at 09/02/16 1035  . heparin ADULT infusion 100 units/mL (25000 units/259mL sodium chloride 0.45%)  1,450 Units/hr Intravenous Continuous Emi Holes, RPH 14.5 mL/hr at 09/01/16 2228 1,450 Units/hr at 09/01/16 2228  . lactulose (CHRONULAC) 10 GM/15ML solution 20 g  20 g Oral Daily PRN Kathleene Hazel, MD   20 g at 08/31/16 1323  . milrinone (PRIMACOR) 20 MG/100 ML (0.2 mg/mL) infusion  0.375 mcg/kg/min Intravenous Continuous Laurey Morale, MD 10.3 mL/hr at 09/02/16 1201 0.375 mcg/kg/min at 09/02/16 1201  . norepinephrine (LEVOPHED) 4 mg in dextrose 5 % 250 mL (0.016 mg/mL) infusion  0-40 mcg/min Intravenous Titrated Mariam Dollar Tillery, PA-C      . ondansetron [facility]) injection 4 mg  4 mg Intravenous Q6H PRN Ok Anis, NP   4 mg at 09/01/16 2215  . oxyCODONE-acetaminophen (PERCOCET/ROXICET) 5-325 MG per tablet 1 tablet  1 tablet Oral Q4H PRN Leone Brand, NP   1 tablet at 09/01/16 2225  . polyethylene glycol (MIRALAX / GLYCOLAX) packet 17 g  17 g Oral Daily Amy D Clegg, NP   17 g at 09/02/16 1034  . sodium chloride flush (NS) 0.9 % injection 10-40 mL  10-40 mL Intracatheter Q12H Lennette Bihari, MD   10 mL at 09/02/16 1000  . sodium chloride flush (NS) 0.9 % injection 10-40 mL  10-40 mL Intracatheter PRN Lennette Bihari, MD      . sodium chloride flush (NS) 0.9 % injection 3 mL  3 mL Intravenous Q12H Laurey Morale, MD   3 mL at 09/02/16 1000  . sodium chloride flush (NS) 0.9 % injection 3 mL  3 mL Intravenous PRN Laurey Morale, MD      . spironolactone (ALDACTONE) tablet 25 mg  25 mg Oral Daily Amy D Clegg, NP   25 mg at 09/02/16 1035    Allergies  Allergen Reactions  . Potassium-Containing Compounds Other (See Comments)    Upset  stomach      Review of Systems:   General:  improved appetite, decreased energy, + weight gain, + weight loss, no fever  Cardiac:  no chest pain with exertion, no chest pain at rest, +SOB with exertion, + resting SOB, no PND, + orthopnea, no palpitations, + arrhythmia, + atrial fibrillation, + LE edema, no dizzy spells, no syncope  Respiratory:  + shortness of breath, no home oxygen, no productive cough, no dry cough, no bronchitis, no wheezing, no hemoptysis, no asthma, no pain with inspiration or cough, no sleep apnea, no CPAP at night  GI:   no difficulty swallowing, no reflux, no frequent heartburn, no hiatal hernia, + abdominal pain, no constipation, no diarrhea, no hematochezia, no hematemesis, no melena  GU:   no dysuria,  no frequency, no urinary tract infection, no hematuria, no enlarged prostate, no kidney stones, + kidney disease  Vascular:  no pain suggestive of claudication, no pain in feet, no leg cramps, no varicose veins, no DVT, no non-healing foot ulcer  Neuro:   no stroke, no TIA's, no seizures, no headaches, no temporary blindness one eye,  no slurred speech, no peripheral neuropathy, no chronic pain, no instability of gait, no memory/cognitive dysfunction  Musculoskeletal: no arthritis, no joint swelling, no myalgias, no difficulty walking, normal mobility   Skin:   no rash, no itching, no skin infections, no pressure sores or ulcerations  Psych:   no anxiety, no depression, no nervousness, no unusual recent stress  Eyes:   no blurry vision, no floaters, no recent vision changes, + wears glasses only for reading  ENT:  no hearing loss, no loose or painful teeth, + upper dentures, last saw dentist approximately 1 year ago  Hematologic:  no easy bruising, no abnormal bleeding, no clotting disorder, no frequent epistaxis  Endocrine:  no diabetes, does not check CBG's at home     Physical Exam:   BP (!) 84/66 (BP Location: Left Arm)   Pulse (!) 125   Temp 97.6 F (36.4  C) (Oral)   Resp 20   Ht 5\' 8"  (1.727 m)   Wt 185 lb 3 oz (84 kg)   SpO2 94%   BMI 28.16 kg/m   General:  Thin male  well-appearing  HEENT:  Unremarkable   Neck:   no JVD, no bruits, no adenopathy   Chest:   clear to auscultation, symmetrical breath sounds, no wheezes, no rhonchi   CV:   Irregular rate and rhythm, grade III/VI systolic murmur   Abdomen:  soft, non-tender, no masses   Extremities:  warm, well-perfused, pulses palpable, + lower extremity edema  Rectal/GU  Deferred  Neuro:   Grossly non-focal and symmetrical throughout  Skin:   Clean and dry, no rashes, no breakdown  Diagnostic Tests:  Echocardiography  Patient:    Aarron, Wierzbicki MR #:       [id number] Study Date: 04/28/2016 Gender:     M Age:        1 Height:     175.3 cm Weight:     85.7 kg BSA:        2.06 m^2 Pt. Status: Room:   ORDERING     Nicki Guadalajara, M.D.  REFERRING    Nicki Guadalajara, M.D.  ATTENDING    Zoila Shutter MD  PERFORMING   [facility], Outpatient  SONOGRAPHER  [facility], RDCS  cc:  ------------------------------------------------------------------- LV EF: 20% -   25%  ------------------------------------------------------------------- Indications:      Aortic Stenosis (I35.0).  ------------------------------------------------------------------- History:   PMH:   Murmur.  Atrial fibrillation.  Congestive heart failure.  Risk factors:  Pleural Effusion, ICD Implanted, Edema. Family history of coronary artery disease. Former tobacco use. Hypertension.  ------------------------------------------------------------------- Study Conclusions  - Left ventricle: The cavity size was normal. Wall thickness was   increased in a pattern of moderate LVH. Systolic function was   severely reduced. The estimated ejection fraction was in the   range of 20% to 25%. Diffuse hypokinesis with anterior,   anteroseptal and apical akinesis. The study is not technically   sufficient to allow  evaluation of LV diastolic function. - Aortic valve: Heavily calcified with restricted mobility. Severe   stenosis. Mean gradient (S): 40 mm Hg. Peak gradient (S): 66 mm   Hg. Valve area (VTI): 0.5 cm^2. Valve area (Vmax): 0.56 cm^2.   Valve area (Vmean): 0.44 cm^2. - Aorta: Ascending aortic diameter: 45 mm (S). - Ascending aorta: The ascending aorta was dilated - there is a   linear density in the ascending aorta which could represent   dissection flap. - Mitral valve: Calcified annulus. Mildly thickened leaflets .   There was mild regurgitation. - Left atrium: Moderately dilated. - Right ventricle: The cavity size was moderately dilated. Systolic   function is reduced. - Right atrium: Severely dilated. - Tricuspid valve: There was moderate regurgitation. - Pulmonary arteries: PA peak pressure: 50 mm Hg (S). - Pericardium, extracardiac: Small to moderate sized   circumferential pericardial effusion, mostly anterior.  Impressions:  - LVEF 20-25%, global hypokinesis with anterior, anteroseptal and   apical akinesis, moderate LVH, severe calcific aortic  stenosis -   mean gradient of 40 mmHg, peak gradient of 66 mmHg (despite low   cardiac output), dilated ascending aorta to 4.5 cm with a   possible dissection flap, there is an assymetric small to   moderate sized pericardial effusion which is mostly anterior,   there is moderate LAE, severe RAE, moderate RVE with reduced RV   function, moderate TR, RVSP 50 mmHg, dilated IVC.  Recommendations:  Findings communicated through the sonographer to Dr. Johney Frame [facility] street DOD at 1:05 pm regarding possible aortic dissection and pericardial effusion) - a CT aortogram with dissection protocol is strongly recommended. Echocardiography.  M-mode, complete 2D, spectral Doppler, and color Doppler.  Birthdate:  Patient birthdate: [birth year].  Age:  Patient is 65 yr old.  Sex:  Gender: male.    BMI: 27.9 kg/m^2.  Blood pressure:     106/77   Patient status:  Outpatient.  Study date: Study date: 04/28/2016. Study time: 11:21 AM.  Location:  [facility]  -------------------------------------------------------------------  ------------------------------------------------------------------- Left ventricle:  The cavity size was normal. Wall thickness was increased in a pattern of moderate LVH. Systolic function was severely reduced. The estimated ejection fraction was in the range of 20% to 25%. Diffuse hypokinesis with anterior, anteroseptal and apical akinesis. The study is not technically sufficient to allow evaluation of LV diastolic function.  ------------------------------------------------------------------- Aortic valve:  Heavily calcified with restricted mobility. Severe stenosis.  Doppler:     VTI ratio of LVOT to aortic valve: 0.12. Valve area (VTI): 0.5 cm^2. Indexed valve area (VTI): 0.24 cm^2/m^2. Peak velocity ratio of LVOT to aortic valve: 0.14. Valve area (Vmax): 0.56 cm^2. Indexed valve area (Vmax): 0.27 cm^2/m^2. Mean velocity ratio of LVOT to aortic valve: 0.11. Valve area (Vmean): 0.44 cm^2. Indexed valve area (Vmean): 0.21 cm^2/m^2. Mean gradient (S): 40 mm Hg. Peak gradient (S): 66 mm Hg.  ------------------------------------------------------------------- Aorta:  Ascending aorta: The ascending aorta was dilated - there is a linear density in the ascending aorta which could represent dissection flap.  ------------------------------------------------------------------- Mitral valve:   Calcified annulus. Mildly thickened leaflets . Doppler:  There was mild regurgitation.  ------------------------------------------------------------------- Left atrium:  Moderately dilated.  ------------------------------------------------------------------- Right ventricle:  The cavity size was moderately dilated. The moderator band was prominent. Systolic function is  reduced.  ------------------------------------------------------------------- Pulmonic valve:    The valve appears to be grossly normal. Doppler:  There was trivial regurgitation.  ------------------------------------------------------------------- Tricuspid valve:   Doppler:  There was moderate regurgitation.  ------------------------------------------------------------------- Pulmonary artery:   Poorly visualized.  ------------------------------------------------------------------- Right atrium:  Severely dilated.  ------------------------------------------------------------------- Pericardium:  Small to moderate sized circumferential pericardial effusion, mostly anterior.  ------------------------------------------------------------------- Systemic veins: Inferior vena cava: The vessel was dilated. The respirophasic diameter changes were blunted (< 50%), consistent with elevated central venous pressure. Diameter: 22.5 mm.  ------------------------------------------------------------------- Measurements   IVC                                       Value          Reference  ID                                        22.5  mm       ---------    Left ventricle  Value          Reference  LV ID, ED, PLAX chordal           (L)     36.7  mm       43 - 52  LV ID, ES, PLAX chordal                   31.3  mm       23 - 38  LV fx shortening, PLAX chordal    (L)     15    %        >=29  LV PW thickness, ED                       15.9  mm       ---------  IVS/LV PW ratio, ED                       1.16           <=1.3  Stroke volume, 2D                         49    ml       ---------  Stroke volume/bsa, 2D                     24    ml/m^2   ---------    Ventricular septum                        Value          Reference  IVS thickness, ED                         18.4  mm       ---------    LVOT                                      Value           Reference  LVOT ID, S                                23    mm       ---------  LVOT area                                 4.15  cm^2     ---------  LVOT peak velocity, S                     55    cm/s     ---------  LVOT mean velocity, S                     31.3  cm/s     ---------  LVOT VTI, S                               11.9  cm       ---------    Aortic valve  Value          Reference  Aortic valve peak velocity, S             405   cm/s     ---------  Aortic valve mean velocity, S             298   cm/s     ---------  Aortic valve VTI, S                       98.5  cm       ---------  Aortic mean gradient, S                   40    mm Hg    ---------  Aortic peak gradient, S                   66    mm Hg    ---------  VTI ratio, LVOT/AV                        0.12           ---------  Aortic valve area, VTI                    0.5   cm^2     ---------  Aortic valve area/bsa, VTI                0.24  cm^2/m^2 ---------  Velocity ratio, peak, LVOT/AV             0.14           ---------  Aortic valve area, peak velocity          0.56  cm^2     ---------  Aortic valve area/bsa, peak               0.27  cm^2/m^2 ---------  velocity  Velocity ratio, mean, LVOT/AV             0.11           ---------  Aortic valve area, mean velocity          0.44  cm^2     ---------  Aortic valve area/bsa, mean               0.21  cm^2/m^2 ---------  velocity    Aorta                                     Value          Reference  Aortic root ID, ED                        33    mm       ---------  Ascending aorta ID, A-P, S                45    mm       ---------    Left atrium                               Value          Reference  LA ID, A-P, ES  50    mm       ---------  LA ID/bsa, A-P                    (H)     2.43  cm/m^2   <=2.2  LA volume, S                              83.8  ml       ---------  LA volume/bsa, S                          40.7   ml/m^2   ---------  LA volume, ES, 1-p A4C                    125   ml       ---------  LA volume/bsa, ES, 1-p A4C                60.7  ml/m^2   ---------  LA volume, ES, 1-p A2C                    55.6  ml       ---------  LA volume/bsa, ES, 1-p A2C                27    ml/m^2   ---------    Pulmonary arteries                        Value          Reference  PA pressure, S, DP                (H)     50    mm Hg    <=30    Tricuspid valve                           Value          Reference  Tricuspid regurg peak velocity            349   cm/s     ---------  Tricuspid peak RV-RA gradient             49    mm Hg    ---------    Right ventricle                           Value          Reference  TAPSE                                     13    mm       ---------  Legend: (L)  and  (H)  mark values outside specified reference range.  ------------------------------------------------------------------- Prepared and Electronically Authenticated by  Zoila Shutter MD 2017-06-16T13:08:54   Transthoracic Echocardiography  Patient:    Lucas Munoz, Lucas Munoz MR #:       [id number] Study Date: 08/26/2016 Gender:     M Age:        66 Height:     172.7 cm Weight:     91.2 kg BSA:        2.12 m^2  Pt. Status: Room:       [facility]   ADMITTING    Nicki Guadalajara, M.D.  ATTENDING    Nicki Guadalajara, M.D.  SONOGRAPHER  Arvil Chaco  PERFORMING   [facility], Inpatient  ORDERING     Graciella Freer  REFERRING    Graciella Freer  cc:  ------------------------------------------------------------------- LV EF: 15%  ------------------------------------------------------------------- Indications:      CHF - 428.0.  ------------------------------------------------------------------- History:   PMH:  AKI on CKD stage III. Atrial fibrillation with RVR. Medical noncompliance.  ------------------------------------------------------------------- Study Conclusions  - Left ventricle: The  cavity size was normal. Wall thickness was   increased in a pattern of moderate LVH. The estimated ejection   fraction was 15%. Diffuse hypokinesis. Indeterminant diastolic   function (atrial fibrillation). - Aortic valve: Trileaflet; severely calcified leaflets. There was   severe stenosis. There was trivial regurgitation. Mean gradient   (S): 46 mm Hg. Valve area (VTI): 0.51 cm^2. - Aorta: Dilated ascending aorta, 4.3 cm. - Mitral valve: Mildly calcified annulus. There was mild   regurgitation. - Left atrium: The atrium was moderately to severely dilated. - Right ventricle: The cavity size was normal. Systolic function   was moderately reduced. - Right atrium: The atrium was moderately dilated. - Tricuspid valve: There was moderate regurgitation. Peak RV-RA   gradient (S): 50 mm Hg. - Pulmonary arteries: PA peak pressure: 65 mm Hg (S). - Systemic veins: IVC measured 2.4 cm with < 50% respirophasic   variation, suggesting RA pressure 15 mmHg. - Pericardium, extracardiac: Moderate to large circumferential   pericardial effusion without tamponade.  Impressions:  - The patient was in atrial fibrillation. Normal LV size with   moderate LV hypertrophy. EF 15%, diffuse hypokinesis. Normal RV   size with moderately decreased systolic function. Biatrial   enlargement. Moderate TR, mild MR. There was severe aortic   stenosis. Moderate pulmonary hypertension. There was a moderate   to large circumferential pericardial effusion without evidence   for tamponade.  ------------------------------------------------------------------- Study data:  Comparison was made to the study of 04/28/2016.  Study status:  Routine.  Procedure:  The patient reported no pain pre or post test. Transthoracic echocardiography. Image quality was adequate.  Study completion:  There were no complications. Transthoracic echocardiography.  M-mode, complete 2D, spectral Doppler, and color Doppler.  Birthdate:   Patient birthdate: 1951-08-12.  Age:  Patient is 65 yr old.  Sex:  Gender: male. BMI: 30.6 kg/m^2.  Blood pressure:     97/86  Patient status: Inpatient.  Study date:  Study date: 08/26/2016. Study time: 10:58 AM.  Location:  ICU/CCU  -------------------------------------------------------------------  ------------------------------------------------------------------- Left ventricle:  The cavity size was normal. Wall thickness was increased in a pattern of moderate LVH. The estimated ejection fraction was 15%. Diffuse hypokinesis. Indeterminant diastolic function (atrial fibrillation).  ------------------------------------------------------------------- Aortic valve:   Trileaflet; severely calcified leaflets.  Doppler:  There was severe stenosis.   There was trivial regurgitation. VTI ratio of LVOT to aortic valve: 0.16. Valve area (VTI): 0.51 cm^2. Indexed valve area (VTI): 0.24 cm^2/m^2. Peak velocity ratio of LVOT to aortic valve: 0.18. Valve area (Vmax): 0.57 cm^2. Indexed valve area (Vmax): 0.27 cm^2/m^2. Mean velocity ratio of LVOT to aortic valve: 0.17. Valve area (Vmean): 0.53 cm^2. Indexed valve area (Vmean): 0.25 cm^2/m^2.    Mean gradient (S): 46 mm Hg.   ------------------------------------------------------------------- Aorta:  Dilated ascending aorta, 4.3 cm.  ------------------------------------------------------------------- Mitral valve:   Mildly calcified annulus.  Doppler:   There was no evidence  for stenosis.   There was mild regurgitation.  ------------------------------------------------------------------- Left atrium:  The atrium was moderately to severely dilated.   ------------------------------------------------------------------- Right ventricle:  The cavity size was normal. Systolic function was moderately reduced.  ------------------------------------------------------------------- Pulmonic valve:    Structurally normal valve.   Cusp  separation was normal.  Doppler:  Transvalvular velocity was within the normal range. There was trivial regurgitation.  ------------------------------------------------------------------- Tricuspid valve:   Doppler:  There was moderate regurgitation.   ------------------------------------------------------------------- Right atrium:  The atrium was moderately dilated.  ------------------------------------------------------------------- Pericardium:  Moderate to large circumferential pericardial effusion without tamponade.  ------------------------------------------------------------------- Systemic veins:  IVC measured 2.4 cm with < 50% respirophasic variation, suggesting RA pressure 15 mmHg.  ------------------------------------------------------------------- Post procedure conclusions Ascending Aorta:  - Dilated ascending aorta, 4.3 cm.  ------------------------------------------------------------------- Measurements   Left ventricle                            Value          Reference  LV ID, ED, PLAX chordal           (L)     39    mm       43 - 52  LV ID, ES, PLAX chordal                   35.1  mm       23 - 38  LV fx shortening, PLAX chordal    (L)     10    %        >=29  LV PW thickness, ED                       15.6  mm       ---------  IVS/LV PW ratio, ED                       0.96           <=1.3  Stroke volume, 2D                         39    ml       ---------  Stroke volume/bsa, 2D                     18    ml/m^2   ---------    Ventricular septum                        Value          Reference  IVS thickness, ED                         15    mm       ---------    LVOT                                      Value          Reference  LVOT ID, S                                20    mm       ---------  LVOT area                                 3.14  cm^2     ---------  LVOT peak velocity, S                     61.3  cm/s     ---------  LVOT mean velocity, S                      42.3  cm/s     ---------  LVOT VTI, S                               12.5  cm       ---------    Aortic valve                              Value          Reference  Aortic valve peak velocity, S             336   cm/s     ---------  Aortic valve mean velocity, S             252   cm/s     ---------  Aortic valve VTI, S                       77.7  cm       ---------  Aortic mean gradient, S                   35    mm Hg    ---------  VTI ratio, LVOT/AV                        0.16           ---------  Aortic valve area, VTI                    0.51  cm^2     ---------  Aortic valve area/bsa, VTI                0.24  cm^2/m^2 ---------  Velocity ratio, peak, LVOT/AV             0.18           ---------  Aortic valve area, peak velocity          0.57  cm^2     ---------  Aortic valve area/bsa, peak               0.27  cm^2/m^2 ---------  velocity  Velocity ratio, mean, LVOT/AV             0.17           ---------  Aortic valve area, mean velocity          0.53  cm^2     ---------  Aortic valve area/bsa, mean               0.25  cm^2/m^2 ---------  velocity    Aorta  Value          Reference  Aortic root ID, ED                        34    mm       ---------    Left atrium                               Value          Reference  LA ID, A-P, ES                            48    mm       ---------  LA ID/bsa, A-P                    (H)     2.27  cm/m^2   <=2.2  LA volume, S                              123   ml       ---------  LA volume/bsa, S                          58.1  ml/m^2   ---------  LA volume, ES, 1-p A4C                    101   ml       ---------  LA volume/bsa, ES, 1-p A4C                47.7  ml/m^2   ---------  LA volume, ES, 1-p A2C                    137   ml       ---------  LA volume/bsa, ES, 1-p A2C                64.7  ml/m^2   ---------    Pulmonary arteries                        Value          Reference  PA  pressure, S, DP                (H)     65    mm Hg    <=30    Tricuspid valve                           Value          Reference  Tricuspid regurg peak velocity            354   cm/s     ---------  Tricuspid peak RV-RA gradient             50    mm Hg    ---------    Systemic veins                            Value          Reference  Estimated CVP  15    mm Hg    ---------    Right ventricle                           Value          Reference  TAPSE                                     12.8  mm       ---------  RV s&', lateral, S                         8.81  cm/s     ---------    Pulmonic valve                            Value          Reference  Pulmonic regurg velocity, ED              178   cm/s     ---------  Pulmonic regurg gradient, ED              13    mm Hg    ---------  Legend: (L)  and  (H)  mark values outside specified reference range.  ------------------------------------------------------------------- Prepared and Electronically Authenticated by  Marca Ancona, M.D. 2017-10-14T16:21:44   Right/Left Heart Cath and Coronary Angiography  Conclusion   1. Filling pressures remain elevated (though think PCWP was not accurately obtained) and cardiac index remains low.  2. Moderate coronary disease but nothing to approach interventionally.   - Continue diuresis.  - Increase milrinone to 0.375 - TAVR evaluation  Procedural Details/Technique   Technical Details Procedure: Right Heart Cath, Selective Coronary Angiography  Indication: Aortic stenosis.   Procedural Details: The right radial area and left brachial area were prepped, draped, and anesthetized with 1% lidocaine. There was a pre-existing peripheral IV in the left brachial area that was replaced with a 33F venous sheath. A Swan-Ganz catheter was used for the right heart catheterization. The right radial artery was entered using modified Seldinger technique and a 41F sheath was placed.  The patient received 3 mg IA verapamil and weight-based IV heparin. Standard protocol was followed for recording of right heart pressures and sampling of oxygen saturations. Fick cardiac output was calculated. Standard Judkins catheters were used for selective coronary angiography. There were no immediate procedural complications. The patient was transferred to the post catheterization recovery area for further monitoring.   Estimated blood loss <50 mL.  During this procedure the patient was administered the following to achieve and maintain moderate conscious sedation: Versed 2 mg, Fentanyl 75 mcg, while the patient's heart rate, blood pressure, and oxygen saturation were continuously monitored. The period of conscious sedation was 37 minutes, of which I was present face-to-face 100% of this time.    Coronary Findings   Dominance: Right  Left Main  Mild ostial narrowing.  Left Anterior Descending  30% proximal LAD stenosis. 60% distal LAD stenosis. Moderate D1 without significant disease. Small to moderate D2 with long 70% ostial stenosis.  Left Circumflex  50% proximal LCx stenosis at OM1. Moderate OM1 with 30% ostial stenosis.  Right Coronary Artery  40% proximal and 40% mid RCA stenosis.  Right Heart   Right Heart Pressures R[facility] Procedural Findings:  Hemodynamics (mmHg) RA mean 19 RV 65/15 PA 69/34, mean 51 PCWP mean 43 (not sure we got an accurate PCWP).  AO 106/87  Oxygen saturations: PA 46% AO 92%  Cardiac Output (Fick) 3.8  Cardiac Index (Fick) 1.9    Wall Motion   Not done, CKD.         Implants     No implant documentation for this case.  PACS Images   Show images for Cardiac catheterization   Link to Procedure Log   Procedure Log    Hemo Data   Flowsheet Row Most Recent Value  Fick Cardiac Output 3.8 L/min  Fick Cardiac Output Index 1.86 (L/min)/BSA  RA A Wave 21 mmHg  RA V Wave 22 mmHg  RA Mean 19 mmHg  RV Systolic Pressure 65 mmHg  RV Diastolic  Pressure 10 mmHg  RV EDP 15 mmHg  PA Systolic Pressure 69 mmHg  PA Diastolic Pressure 34 mmHg  PA Mean 51 mmHg  PW A Wave 46 mmHg  PW V Wave 52 mmHg  PW Mean 40 mmHg  AO Systolic Pressure 106 mmHg  AO Diastolic Pressure 87 mmHg  AO Mean 94 mmHg  QP/QS 1  TPVR Index 27.35 HRUI  TSVR Index 50.41 HRUI  PVR SVR Ratio 0.11  TPVR/TSVR Ratio 0.54    Cardiac TAVR CT  TECHNIQUE: The patient was scanned on a Philips 256 scanner. A 120 kV retrospective scan was triggered in the descending thoracic aorta at 111 HU's. Gantry rotation speed was 270 msecs and collimation was .9 mm. No beta blockade or nitro were given. The 3D data set was reconstructed in 5% intervals of the R-R cycle. Systolic and diastolic phases were analyzed on a dedicated work station using MPR, MIP and VRT modes. The patient received 80 cc of contrast.  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 Heavily calcified  Aorta:  Aortic root dilated  Sinotubular Junction:  32 mm  Ascending Thoracic Aorta:  43 mm  Aortic Arch:  30 mm  Descending Thoracic Aorta:  26 mm  Sinus of Valsalva Measurements:  Non-coronary:  33 mm  Right -coronary:  31 mm  Left -coronary:  32 mm  Coronary Artery Height above Annulus:  Left Main:  19 mm above annulus  Right Coronary:  10 mm above annulus  Virtual Basal Annulus Measurements:  Maximum/Minimum Diameter:  27.1 x 20.3 mm  Perimeter:  78 mm  Area:  442 mm2  Coronary Arteries:  Sufficient height above annulus for deployment  Optimum Fluoroscopic Angle for Delivery:  LAO 29 degrees  IMPRESSION: 1) functionally bicuspid AV heavily calcified with annulus suitable for 26 mm Sapien 3 valve  2) Moderate aortic root enlargement 4.3 cm  3) Large pericardial effusion patient to have pericardiocentesis post CT  4) Cannot r/o LAA thrombus suggest TEE correlation  5) Optimum angiographic angle for  deployment LAO 29 degrees  6) Coronary heights sufficient for deployment  Charlton Haws    STS Risk Calculator  Procedure    AVR  Risk of Mortality   7.2% Morbidity or Mortality  50.3% Prolonged LOS   23.0% Short LOS    10.3% Permanent Stroke   1.0% Prolonged Vent Support  37.8% DSW Infection    1.1% Renal Failure    10.9% Reoperation    16.0%    Impression:  Patient has stage D severe symptomatic aortic stenosis with severe nonischemic cardiomyopathy and 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 He describes a long-standing history of symptoms of congestive heart failure that have progressed over the past 2 years. Symptoms have become dramatically worse over the last several months, and he presented with essentially decompensated end-stage congestive heart failure with severe volume overload and both pericardial and pleural effusions. I have personally reviewed the patient's last 2 transthoracic echocardiograms and diagnostic cardiac catheterization. The patient has a functionally bicuspid aortic valve with very severe aortic stenosis. There is severe calcification, thickening, and extremely restricted leaflet mobility involving the entire valve. On the echocardiogram performed last June the peak velocity across the aortic valve measured greater than 4 m/s despite severe global left ventricular systolic dysfunction and ejection fraction estimated at only 20-25%.  Diagnostic cardiac catheterization is notable for moderate nonobstructive coronary artery disease, relatively low cardiac output and mixed venous oxygen saturation, and elevated filling pressures with moderate to severe pulmonary hypertension. I agree the patient's only chance for long-term survival requires aortic valve replacement. Risks associated with conventional surgery would be very high because of the patient's extremely severe left ventricular systolic dysfunction and numerous  comorbid medical problems including long-standing persistent atrial fibrillation, pulmonary embolus, chronic lung disease, and cirrhosis. I agree that transcatheter aortic valve replacement would probably be considerably less risky and associated with likely improved hemodynamic result is long as he doesn't have a significant paravalvular leak. 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  Maximum transverse diameter of the ascending thoracic aorta measured only 4.3 cm on the cardiac gated CT angiogram performed yesterday, noticeably less than that reported on the CT angiogram performed last June.  Cardiac gated CTA confirmed the presence of a functionally bicuspid aortic valve with severe aortic stenosis and anatomical characteristics suitable for transcatheter aortic valve replacement using a 26 mm Edwards Sapien 3 transcatheter heart valve without any significant complicating features. The patient does have 2 areas of nodular calcification in the aortic annulus and might be at some risk for the development of perivalvular leak, but the degree of annular calcification is not severe or particularly worrisome.   Plan:  The patient was counseled at length regarding treatment alternatives for management of severe symptomatic aortic stenosis. Alternative approaches such as conventional aortic valve replacement, transcatheter aortic valve replacement, and palliative medical therapy were compared and contrasted at length.  The risks associated with conventional surgical aortic valve replacement were been discussed in detail, as were expectations for post-operative convalescence. Long-term prognosis with medical therapy was discussed. Expectations regarding his convalescence following uncomplicated transcatheter aortic valve replacement was discussed. This discussion was placed in the context  of the patient's own specific clinical presentation and past medical history, most notably with regards to his underlying severe left ventricular systolic dysfunction.  All of his questions been addressed.  The patient is interested in proceeding with transcatheter aortic valve replacement as an alternative to high risk conventional surgery. To complete his workup he will undergo CT angiography of the aorta and iliac vessels. Given that his creatinine remains stable today we will plan to proceed with CT angiography tomorrow. It is possible that we could proceed with transcatheter aortic valve replacement on Tuesday, 09/05/2016.    I spent in excess of 120 minutes during the conduct of this hospital consultation and >50% of this time involved direct face-to-face encounter for counseling and/or coordination of the patient's care.    Salvatore Decent. Cornelius Moras, MD 09/02/2016 3:04 PM

## 2016-09-02 NOTE — Progress Notes (Signed)
Patient ID: Lucas Munoz, male   DOB: 1951/01/17, 65 y.o.   MRN: [id number]    SUBJECTIVE:   PICC placed , initial co-ox on 10/14 47%.  He was started on milrinone. Milrinone increased to 0.375 mcg 08/29/16 for low mixed venous sat on RHC (CI 1.9).  Underwent pericardiocentesis on 10/20 with 775cc out. Fluid transudative. Drained another 250cc overnight.   Feeling better. Denies SOB. Chest fells a little tight.   Today's CO-OX 61%.   Creatinine stable at 1.52   Being planned for TAVR, possibly next week.  RHC/LHC (08/29/16) Left Main  Mild ostial narrowing.  Left Anterior Descending  30% proximal LAD stenosis. 60% distal LAD stenosis. Moderate D1 without significant disease. Small to moderate D2 with long 70% ostial stenosis.  Left Circumflex  50% proximal LCx stenosis at OM1. Moderate OM1 with 30% ostial stenosis.  Right Coronary Artery  40% proximal and 40% mid RCA stenosis.   RA mean 19 RV 65/15 PA 69/34, mean 51 PCWP mean 43 (not sure we got an accurate PCWP).  AO 106/87 Oxygen saturations: PA 46% AO 92% Cardiac Output (Fick) 3.8  Cardiac Index (Fick) 1.9  Echo (10/14): EF 15%, severe AS, moderately decreased RV systolic function, PASP 65 mmHg, moderate to large pericardial effusion without tamponade.   Scheduled Meds: . aspirin  81 mg Oral Daily  . atorvastatin  40 mg Oral q1800  . digoxin  0.125 mg Oral Daily  . famotidine  10 mg Oral BID  . furosemide  60 mg Oral BID  . polyethylene glycol  17 g Oral Daily  . sodium chloride flush  10-40 mL Intracatheter Q12H  . sodium chloride flush  3 mL Intravenous Q12H  . spironolactone  25 mg Oral Daily   Continuous Infusions: . amiodarone 30 mg/hr (09/02/16 0237)  . heparin 1,450 Units/hr (09/01/16 2228)  . milrinone 0.375 mcg/kg/min (09/02/16 1201)  . norepinephrine (LEVOPHED) Adult infusion     PRN Meds:.sodium chloride, acetaminophen, alum & mag hydroxide-simeth, docusate sodium, lactulose, ondansetron (ZOFRAN) IV,  oxyCODONE-acetaminophen, sodium chloride flush, sodium chloride flush   Vitals:   09/02/16 0358 09/02/16 0400 09/02/16 0817 09/02/16 1140  BP: (!) 85/64 (!) 85/64 (!) 80/64 (!) 84/66  Pulse: 97 (!) 57 (!) 126 (!) 125  Resp: 20 19 16 20   Temp: 98.3 F (36.8 C)  97.4 F (36.3 C) 97.6 F (36.4 C)  TempSrc: Oral  Oral Oral  SpO2: 97% 97% 99% 94%  Weight: 185 lb 3 oz (84 kg)     Height:        Intake/Output Summary (Last 24 hours) at 09/02/16 1240 Last data filed at 09/02/16 0700  Gross per 24 hour  Intake          1590.15 ml  Output              655 ml  Net           935.15 ml    LABS: Basic Metabolic Panel:  Recent Labs  14/78/29 0500 09/02/16 0329  NA 134* 133*  K 3.8 4.0  CL 100* 100*  CO2 26 25  GLUCOSE 124* 158*  BUN 13 15  CREATININE 1.46* 1.52*  CALCIUM 8.8* 8.6*   Liver Function Tests: No results for input(s): AST, ALT, ALKPHOS, BILITOT, PROT, ALBUMIN in the last 72 hours. No results for input(s): LIPASE, AMYLASE in the last 72 hours. CBC:  Recent Labs  09/01/16 0500 09/02/16 0329  WBC 6.3 13.4*  HGB 12.1* 12.5*  HCT 39.2 39.3  MCV 84.5 82.6  PLT 123* 135*   Cardiac Enzymes: No results for input(s): CKTOTAL, CKMB, CKMBINDEX, TROPONINI in the last 72 hours. BNP: Invalid input(s): POCBNP D-Dimer: No results for input(s): DDIMER in the last 72 hours. Hemoglobin A1C: No results for input(s): HGBA1C in the last 72 hours. Fasting Lipid Panel: No results for input(s): CHOL, HDL, LDLCALC, TRIG, CHOLHDL, LDLDIRECT in the last 72 hours. Thyroid Function Tests: No results for input(s): TSH, T4TOTAL, T3FREE, THYROIDAB in the last 72 hours.  Invalid input(s): FREET3 Anemia Panel: No results for input(s): VITAMINB12, FOLATE, FERRITIN, TIBC, IRON, RETICCTPCT in the last 72 hours.  RADIOLOGY: Ct Cardiac Morph/pulm Vein W/cm&w/o Ca Score  Addendum Date: 09/01/2016   ADDENDUM REPORT: 09/01/2016 17:18 CLINICAL DATA:  Aortic stenosis EXAM: Cardiac TAVR CT  TECHNIQUE: The patient was scanned on a Philips 256 scanner. A 120 kV retrospective scan was triggered in the descending thoracic aorta at 111 HU's. Gantry rotation speed was 270 msecs and collimation was .9 mm. No beta blockade or nitro were given. The 3D data set was reconstructed in 5% intervals of the R-R cycle. Systolic and diastolic phases were analyzed on a dedicated work station using MPR, MIP and VRT modes. The patient received 80 cc of contrast. 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 Heavily calcified Aorta:  Aortic root dilated Sinotubular Junction:  32 mm Ascending Thoracic Aorta:  43 mm Aortic Arch:  30 mm Descending Thoracic Aorta:  26 mm Sinus of Valsalva Measurements: Non-coronary:  33 mm Right -coronary:  31 mm Left -coronary:  32 mm Coronary Artery Height above Annulus: Left Main:  19 mm above annulus Right Coronary:  10 mm above annulus Virtual Basal Annulus Measurements: Maximum/Minimum Diameter:  27.1 x 20.3 mm Perimeter:  78 mm Area:  442 mm2 Coronary Arteries:  Sufficient height above annulus for deployment Optimum Fluoroscopic Angle for Delivery:  LAO 29 degrees IMPRESSION: 1) functionally bicuspid AV heavily calcified with annulus suitable for 26 mm Sapien 3 valve 2) Moderate aortic root enlargement 4.3 cm 3) Large pericardial effusion patient to have pericardiocentesis post CT 4) Cannot r/o LAA thrombus suggest TEE correlation 5) Optimum angiographic angle for deployment LAO 29 degrees 6) Coronary heights sufficient for deployment Charlton HawsPeter Nishan Electronically Signed   By: Charlton HawsPeter  Nishan M.D.   On: 09/01/2016 17:18   Result Date: 09/01/2016 EXAM: OVER-READ INTERPRETATION  CT CHEST The following report is an over-read performed by radiologist Dr. Royal Piedraaniel Entrikinof [facility] on 09/01/2016. This over-read does not include interpretation of cardiac or coronary anatomy or pathology. The coronary calcium score/coronary CTA interpretation by  the cardiologist is attached. COMPARISON:  Chest CT 05/01/2016. FINDINGS: Large pericardial effusion. No pericardial calcification. Aortic atherosclerosis with aneurysmal dilatation of the ascending thoracic aorta which measures up to 4.8 cm in diameter (unchanged). Large right-sided pleural effusion layering dependently. Extensive passive atelectasis in the right lower lobe. Tiny calcified granuloma in the periphery of the right lower lobe. 4 mm subpleural noncalcified pulmonary nodule in the left lower lobe abutting the major fissure (image 71 of series 512), unchanged compared to prior study 05/01/2016, favored to represent a subpleural lymph node. No other suspicious appearing pulmonary nodules or masses are noted in the visualized portions of the thorax. Partially calcified pleural plaque in the right hemithorax (image 31 of series 513) again noted. No calcified pleural plaques in the left hemithorax. Visualized portions of the upper abdomen demonstrate a nodular contour of the liver,  suggestive of underlying cirrhosis. No aggressive appearing lytic or blastic lesions are noted in the visualized portions of the skeleton. Right upper extremity PICC with tip terminating in the distal superior vena cava. IMPRESSION: 1. Interval increased size of what is now a large pericardial effusion. No associated pericardial calcification. 2. Enlargement of a large layering right-sided pleural effusion. This is associated with extensive passive atelectasis in the right lower lobe. 3. Aortic atherosclerosis, with similar aneurysmal dilatation of the ascending thoracic aorta which measures up to 4.8 cm in diameter. 4. Morphologic changes in the liver indicative of cirrhosis, as above. 5. 4 mm subpleural nodule in the periphery of the left lower lobe is nonspecific, but statistically likely a subpleural lymph node. No follow-up needed if patient is low-risk. Non-contrast chest CT can be considered in 12 months if patient is  high-risk. This recommendation follows the consensus statement: Guidelines for Management of Incidental Pulmonary Nodules Detected on CT Images: From the Fleischner Society 2017; Radiology 2017; 284:228-243. 6. Additional incidental findings, as above. Electronically Signed: By: Trudie Reed M.D. On: 09/01/2016 14:38   Dg Chest Port 1 View  Result Date: 08/26/2016 CLINICAL DATA:  65 year old male with a history of shortness of breath EXAM: PORTABLE CHEST 1 VIEW COMPARISON:  08/25/2016, CT chest 05/01/2016 FINDINGS: Compare to the prior plain film there is unchanged size of the cardiac silhouette. Calcifications of the aortic arch. Interval placement of right upper extremity PICC with the tip appearing to terminate at the superior cavoatrial junction. Increasing opacity at the right base with thickening of the minor fissure, obscuration the right hemidiaphragm and right heart border. Interlobular septal thickening. IMPRESSION: Evidence of CHF with increasing right-sided pleural effusion/atelectasis. Unchanged cardiac silhouette in this patient with a history of pericardial effusion imaged on prior CT 05/01/2016. Interval placement of right upper extremity PICC. Aortic atherosclerosis. Signed, Yvone Neu. Loreta Ave, DO [facility] Electronically Signed   By: Gilmer Mor D.O.   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 Dilated cardiomyopathy. Acute kidney injury. EXAM: PORTABLE CHEST 1 VIEW COMPARISON:  None. FINDINGS: Marked cardiac enlargement noted. Diffuse interstitial infiltrates consistent with mild interstitial edema. Small right pleural effusion also seen with right basilar atelectasis. IMPRESSION: Mild congestive heart failure, with small right pleural effusion and right basilar atelectasis. Electronically Signed   By: Myles Rosenthal M.D.   On:  08/25/2016 15:14    PHYSICAL EXAM General: NAD Neck: JVP 9 no thyromegaly or thyroid nodule.  Lungs: Clear  CV: Nondisplaced PMI.  Heart mildly tachy, irregular S1/S2, no S3/S4, 3/6 crescendo-decrescendo murmur RUSB. Trace-1+ ankle edema.  Pericardial drain in place Abdomen: Soft, NT, ND, no HSM. No bruits or masses. +BS  Neurologic: Alert and oriented x 3.  Psych: Normal affect. Extremities: No clubbing or cyanosis. RUE PICC  TELEMETRY: Reviewed, A fib 100-110s   ASSESSMENT AND PLAN: 65 y/o ?with a h/o severe AS, LV dysfxn, HTN, noncompliance, thyroid nodules, pericardial effusion, cholelithiasis, and persistent AF on eliquis, who presentedto clinic on 08/23/16 with volume overload and was admitted for further work up of AS and CHF.  1. Acute on chronic systolic CHF: EF 72-15% in setting of severe aortic stenosis.  May be due to severe aortic stenosis given lack of flow-limiting CAD.  This admission, he was initially hypotensive with SBP in 80s though BP has improved to 100s-110s range.  He was also  markedly volume overloaded. PICC was placed, low output confirmed by co-ox 47%.  Milrinone 0.25 started then increased to 0.375, co-ox 61% this morning.  Creatinine 1.5 today.  CVP 8-9.  Now po po Lasix but good diuresis yesterday. - Continue Lasix 60 mg po bid. - Continue milrinone 0.375 mcg/kg/min, likely will continue through TAVR.   - Hypokalemia improved - Continue spiro 25 mg daily.   - Continue digoxin, check level Monday.  2. Aortic stenosis: Severe AS.  Will need AVR, likely best suited for TAVR.   -  Drs. McAlhany and Cornelius Moras have seen. Needs gated CTA abdomen/pelvis with runoff on Sunday (splitting the contrast load given CKD).   3. AKI on CKD stage III: Baseline creatinine 1.7-1.8.  Down to 1.5 today.  - Continue to follow closely with po Lasix and need for contrast boluses with CTs.  4. Atrial fibrillation: Persistent, with RVR this admission.  He has been in atrial fibrillation at  least since 6/17.  No prior DCCV.  Not sure how long prior to 6/17 he was in atrial fibrillation.  - Continue amiodarone gtt for now for rate control, HR now in 100s at rest with milrinone running.  - Continue heparin gtt (was on apixaban at home).  - Continue digoxin. - Plan for DCCV post-TAVR, hopefully off milrinone at that point.  5. Pericardial effusion: Moderate to large, no tamponade.   - s/p pericardiocentesis with Dr Clifton James 10/20. 775 ccs fluid out. Fluid is transudative.  - 250 cc drained overnight. Will keep drain in today until fluid < 50 CC/ 24 hours 6. Cirrhosis: Noted on prior abdominal CT, LFTs improved with diuresis.  7. CAD:  LHC with moderate coronary disease, nothing flow-limiting.  - Continue statin and 81 mg aspirin.   Arvilla Meres MD 09/02/2016 12:40 PM

## 2016-09-02 NOTE — Progress Notes (Signed)
ANTICOAGULATION CONSULT NOTE - Follow-up Consult  Pharmacy Consult for Heparin Indication: atrial fibrillation   Assessment: 65yom on apixaban for afib, now transitioned  to heparin. Last apixaban dose 10/13 am. Heparin level and APTT correlating now use HL only. S/p pericardiocentesis 10/20 and plan for TAVR next week. Spoke with Dr. Clifton James, ok to restart heparin.  Heparin drip 1450 uts/hr HL 0.34 at goal, no bleeding, pericardial drain yellow non bloody fluid. CBC stable  Goal of Therapy:  Heparin level 0.3-0.7 units/ml Monitor platelets by anticoagulation protocol: Yes   Plan:  1) Continue  heparin at 1450 units/hr  2) Daily heparin level, CBC  Patient Measurements: Height: 5\' 8"  (172.7 cm) Weight: 185 lb 3 oz (84 kg) IBW/kg (Calculated) : 68.4 Heparin Dosing Weight: 86.8kg  Vital Signs: Temp: 97.6 F (36.4 C) (10/21 1140) Temp Source: Oral (10/21 1140) BP: 84/66 (10/21 1140) Pulse Rate: 125 (10/21 1140)  Labs:  Recent Labs  08/31/16 0530 09/01/16 0500 09/02/16 0329  HGB 11.7* 12.1* 12.5*  HCT 37.1* 39.2 39.3  PLT 127* 123* 135*  HEPARINUNFRC 0.42 0.50 0.32  CREATININE 1.61* 1.46* 1.52*    Estimated Creatinine Clearance: 49.8 mL/min (by C-G formula based on SCr of 1.52 mg/dL (H)).   Leota Sauers Pharm.D. CPP, BCPS Clinical Pharmacist [phone] 09/02/2016 12:37 PM

## 2016-09-02 NOTE — Progress Notes (Signed)
1350  Pt with pericardial drain. Will continue to follow and hold ambulation. Luetta Nutting RN BSN 09/02/2016 1:50 PM

## 2016-09-03 ENCOUNTER — Inpatient Hospital Stay (HOSPITAL_COMMUNITY): Payer: Commercial Managed Care - HMO

## 2016-09-03 DIAGNOSIS — Z01810 Encounter for preprocedural cardiovascular examination: Secondary | ICD-10-CM

## 2016-09-03 LAB — BASIC METABOLIC PANEL
ANION GAP: 9 (ref 5–15)
BUN: 17 mg/dL (ref 6–20)
CHLORIDE: 98 mmol/L — AB (ref 101–111)
CO2: 26 mmol/L (ref 22–32)
Calcium: 8.7 mg/dL — ABNORMAL LOW (ref 8.9–10.3)
Creatinine, Ser: 1.59 mg/dL — ABNORMAL HIGH (ref 0.61–1.24)
GFR calc Af Amer: 50 mL/min — ABNORMAL LOW (ref >60)
GFR, EST NON AFRICAN AMERICAN: 43 mL/min — AB (ref >60)
GLUCOSE: 102 mg/dL — AB (ref 65–99)
POTASSIUM: 3.9 mmol/L (ref 3.5–5.1)
Sodium: 133 mmol/L — ABNORMAL LOW (ref 135–145)

## 2016-09-03 LAB — CBC
HEMATOCRIT: 38.9 % — AB (ref 39.0–52.0)
HEMOGLOBIN: 12.5 g/dL — AB (ref 13.0–17.0)
MCH: 26.5 pg (ref 26.0–34.0)
MCHC: 32.1 g/dL (ref 30.0–36.0)
MCV: 82.4 fL (ref 78.0–100.0)
Platelets: 124 10*3/uL — ABNORMAL LOW (ref 150–400)
RBC: 4.72 MIL/uL (ref 4.22–5.81)
RDW: 18 % — ABNORMAL HIGH (ref 11.5–15.5)
WBC: 10.2 10*3/uL (ref 4.0–10.5)

## 2016-09-03 LAB — HEPARIN LEVEL (UNFRACTIONATED): Heparin Unfractionated: 0.26 IU/mL — ABNORMAL LOW (ref 0.30–0.70)

## 2016-09-03 LAB — COOXEMETRY PANEL
Carboxyhemoglobin: 1.7 % — ABNORMAL HIGH (ref 0.5–1.5)
Methemoglobin: 0.7 % (ref 0.0–1.5)
O2 SAT: 90.9 %
TOTAL HEMOGLOBIN: 11.5 g/dL — AB (ref 12.0–16.0)

## 2016-09-03 LAB — DIGOXIN LEVEL: Digoxin Level: 0.4 ng/mL — ABNORMAL LOW (ref 0.8–2.0)

## 2016-09-03 MED ORDER — IOPAMIDOL (ISOVUE-370) INJECTION 76%
INTRAVENOUS | Status: AC
  Administered 2016-09-03: 100 mL
  Filled 2016-09-03: qty 100

## 2016-09-03 NOTE — Progress Notes (Signed)
Report received via RN using SBAR format, reviewed VS, POC and patient's general condition, assumed care of patient. 

## 2016-09-03 NOTE — Progress Notes (Signed)
ANTICOAGULATION CONSULT NOTE - Follow-up Consult  Pharmacy Consult for Heparin Indication: atrial fibrillation   Assessment: 65yom on apixaban for afib, now transitioned  to heparin. Last apixaban dose 10/13 am. Heparin level and APTT correlating now use HL only. S/p pericardiocentesis 10/20 with drain in place and plan for TAVR next week. Restarted heparin drip 10/20 Heparin drip 1450 uts/hr HL fell 0.34>0.26 over night on same rate - will not increase as pericardial drain in place and do not want to increase risk of bleeding  -  no bleeding, pericardial drain yellow fluid. CBC stable  Goal of Therapy:  Heparin level 0.3-0.7 units/ml - will run low today while pericardial drain in place Monitor platelets by anticoagulation protocol: Yes   Plan:  1) Continue  heparin at 1450 units/hr  2) Daily heparin level, CBC  Patient Measurements: Height: 5\' 8"  (172.7 cm) Weight: 185 lb 3 oz (84 kg) IBW/kg (Calculated) : 68.4 Heparin Dosing Weight: 86.8kg  Vital Signs: Temp: 98.9 F (37.2 C) (10/22 0800) Temp Source: Oral (10/22 0800) BP: 90/68 (10/22 0800) Pulse Rate: 74 (10/22 0800)  Labs:  Recent Labs  09/01/16 0500 09/02/16 0329 09/03/16 0528  HGB 12.1* 12.5* 12.5*  HCT 39.2 39.3 38.9*  PLT 123* 135* 124*  HEPARINUNFRC 0.50 0.32 0.26*  CREATININE 1.46* 1.52* 1.59*    Estimated Creatinine Clearance: 47.6 mL/min (by C-G formula based on SCr of 1.59 mg/dL (H)).   Leota Sauers Pharm.D. CPP, BCPS Clinical Pharmacist [phone] 09/03/2016 11:23 AM

## 2016-09-03 NOTE — Progress Notes (Signed)
Patient ID: Lucas Munoz, male   DOB: 09/11/1951, 65 y.o.   MRN: [id number]    SUBJECTIVE:   PICC placed , initial co-ox on 10/14 47%.  He was started on milrinone. Milrinone increased to 0.375 mcg 08/29/16 for low mixed venous sat on RHC (CI 1.9).  Underwent pericardiocentesis on 10/20 with 775cc out. Fluid transudative. Drained another 550cc yesterday  Feeling better. Denies SOB. CVP 7  Today's CO-OX 91% (erroneous).   Creatinine stable at 1.5  Being planned for TAVR on Tuesday. Dr. Cornelius Moraswen has seen. CT abd done this am.   RHC/LHC (08/29/16) Left Main  Mild ostial narrowing.  Left Anterior Descending  30% proximal LAD stenosis. 60% distal LAD stenosis. Moderate D1 without significant disease. Small to moderate D2 with long 70% ostial stenosis.  Left Circumflex  50% proximal LCx stenosis at OM1. Moderate OM1 with 30% ostial stenosis.  Right Coronary Artery  40% proximal and 40% mid RCA stenosis.   RA mean 19 RV 65/15 PA 69/34, mean 51 PCWP mean 43 (not sure we got an accurate PCWP).  AO 106/87 Oxygen saturations: PA 46% AO 92% Cardiac Output (Fick) 3.8  Cardiac Index (Fick) 1.9  Echo (10/14): EF 15%, severe AS, moderately decreased RV systolic function, PASP 65 mmHg, moderate to large pericardial effusion without tamponade.   Scheduled Meds: . aspirin  81 mg Oral Daily  . atorvastatin  40 mg Oral q1800  . digoxin  0.125 mg Oral Daily  . famotidine  10 mg Oral BID  . furosemide  60 mg Oral BID  . polyethylene glycol  17 g Oral Daily  . sodium chloride flush  10-40 mL Intracatheter Q12H  . sodium chloride flush  3 mL Intravenous Q12H  . spironolactone  25 mg Oral Daily   Continuous Infusions: . amiodarone 30 mg/hr (09/03/16 0313)  . heparin 1,450 Units/hr (09/03/16 0857)  . milrinone 0.375 mcg/kg/min (09/03/16 0615)  . norepinephrine (LEVOPHED) Adult infusion     PRN Meds:.sodium chloride, acetaminophen, alum & mag hydroxide-simeth, docusate sodium, lactulose,  ondansetron (ZOFRAN) IV, oxyCODONE-acetaminophen, sodium chloride flush, sodium chloride flush   Vitals:   09/03/16 0800 09/03/16 1125 09/03/16 1127 09/03/16 1130  BP: 90/68   103/83  Pulse: 74 (!) 113 86   Resp: 18     Temp: 98.9 F (37.2 C)  98.1 F (36.7 C)   TempSrc: Oral  Oral   SpO2: 97%   96%  Weight:      Height:        Intake/Output Summary (Last 24 hours) at 09/03/16 1217 Last data filed at 09/03/16 1100  Gross per 24 hour  Intake            839.8 ml  Output             1250 ml  Net           -410.2 ml    LABS: Basic Metabolic Panel:  Recent Labs  40/98/1110/21/17 0329 09/03/16 0528  NA 133* 133*  K 4.0 3.9  CL 100* 98*  CO2 25 26  GLUCOSE 158* 102*  BUN 15 17  CREATININE 1.52* 1.59*  CALCIUM 8.6* 8.7*   Liver Function Tests: No results for input(s): AST, ALT, ALKPHOS, BILITOT, PROT, ALBUMIN in the last 72 hours. No results for input(s): LIPASE, AMYLASE in the last 72 hours. CBC:  Recent Labs  09/02/16 0329 09/03/16 0528  WBC 13.4* 10.2  HGB 12.5* 12.5*  HCT 39.3 38.9*  MCV 82.6 82.4  PLT  135* 124*   Cardiac Enzymes: No results for input(s): CKTOTAL, CKMB, CKMBINDEX, TROPONINI in the last 72 hours. BNP: Invalid input(s): POCBNP D-Dimer: No results for input(s): DDIMER in the last 72 hours. Hemoglobin A1C: No results for input(s): HGBA1C in the last 72 hours. Fasting Lipid Panel: No results for input(s): CHOL, HDL, LDLCALC, TRIG, CHOLHDL, LDLDIRECT in the last 72 hours. Thyroid Function Tests: No results for input(s): TSH, T4TOTAL, T3FREE, THYROIDAB in the last 72 hours.  Invalid input(s): FREET3 Anemia Panel: No results for input(s): VITAMINB12, FOLATE, FERRITIN, TIBC, IRON, RETICCTPCT in the last 72 hours.  RADIOLOGY: Ct Cardiac Morph/pulm Vein W/cm&w/o Ca Score  Addendum Date: 09/01/2016   ADDENDUM REPORT: 09/01/2016 17:18 CLINICAL DATA:  Aortic stenosis EXAM: Cardiac TAVR CT TECHNIQUE: The patient was scanned on a Philips 256 scanner.  A 120 kV retrospective scan was triggered in the descending thoracic aorta at 111 HU's. Gantry rotation speed was 270 msecs and collimation was .9 mm. No beta blockade or nitro were given. The 3D data set was reconstructed in 5% intervals of the R-R cycle. Systolic and diastolic phases were analyzed on a dedicated work station using MPR, MIP and VRT modes. The patient received 80 cc of contrast. 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 Heavily calcified Aorta:  Aortic root dilated Sinotubular Junction:  32 mm Ascending Thoracic Aorta:  43 mm Aortic Arch:  30 mm Descending Thoracic Aorta:  26 mm Sinus of Valsalva Measurements: Non-coronary:  33 mm Right -coronary:  31 mm Left -coronary:  32 mm Coronary Artery Height above Annulus: Left Main:  19 mm above annulus Right Coronary:  10 mm above annulus Virtual Basal Annulus Measurements: Maximum/Minimum Diameter:  27.1 x 20.3 mm Perimeter:  78 mm Area:  442 mm2 Coronary Arteries:  Sufficient height above annulus for deployment Optimum Fluoroscopic Angle for Delivery:  LAO 29 degrees IMPRESSION: 1) functionally bicuspid AV heavily calcified with annulus suitable for 26 mm Sapien 3 valve 2) Moderate aortic root enlargement 4.3 cm 3) Large pericardial effusion patient to have pericardiocentesis post CT 4) Cannot r/o LAA thrombus suggest TEE correlation 5) Optimum angiographic angle for deployment LAO 29 degrees 6) Coronary heights sufficient for deployment Charlton Haws Electronically Signed   By: Charlton Haws M.D.   On: 09/01/2016 17:18   Result Date: 09/01/2016 EXAM: OVER-READ INTERPRETATION  CT CHEST The following report is an over-read performed by radiologist Dr. Royal [facility] on 09/01/2016. This over-read does not include interpretation of cardiac or coronary anatomy or pathology. The coronary calcium score/coronary CTA interpretation by the cardiologist is attached. COMPARISON:  Chest CT  05/01/2016. FINDINGS: Large pericardial effusion. No pericardial calcification. Aortic atherosclerosis with aneurysmal dilatation of the ascending thoracic aorta which measures up to 4.8 cm in diameter (unchanged). Large right-sided pleural effusion layering dependently. Extensive passive atelectasis in the right lower lobe. Tiny calcified granuloma in the periphery of the right lower lobe. 4 mm subpleural noncalcified pulmonary nodule in the left lower lobe abutting the major fissure (image 71 of series 512), unchanged compared to prior study 05/01/2016, favored to represent a subpleural lymph node. No other suspicious appearing pulmonary nodules or masses are noted in the visualized portions of the thorax. Partially calcified pleural plaque in the right hemithorax (image 31 of series 513) again noted. No calcified pleural plaques in the left hemithorax. Visualized portions of the upper abdomen demonstrate a nodular contour of the liver, suggestive of underlying cirrhosis. No aggressive appearing lytic or  blastic lesions are noted in the visualized portions of the skeleton. Right upper extremity PICC with tip terminating in the distal superior vena cava. IMPRESSION: 1. Interval increased size of what is now a large pericardial effusion. No associated pericardial calcification. 2. Enlargement of a large layering right-sided pleural effusion. This is associated with extensive passive atelectasis in the right lower lobe. 3. Aortic atherosclerosis, with similar aneurysmal dilatation of the ascending thoracic aorta which measures up to 4.8 cm in diameter. 4. Morphologic changes in the liver indicative of cirrhosis, as above. 5. 4 mm subpleural nodule in the periphery of the left lower lobe is nonspecific, but statistically likely a subpleural lymph node. No follow-up needed if patient is low-risk. Non-contrast chest CT can be considered in 12 months if patient is high-risk. This recommendation follows the consensus  statement: Guidelines for Management of Incidental Pulmonary Nodules Detected on CT Images: From the Fleischner Society 2017; Radiology 2017; 284:228-243. 6. Additional incidental findings, as above. Electronically Signed: By: Trudie Reed M.D. On: 09/01/2016 14:38   Dg Chest Port 1 View  Result Date: 08/26/2016 CLINICAL DATA:  65 year old male with a history of shortness of breath EXAM: PORTABLE CHEST 1 VIEW COMPARISON:  08/25/2016, CT chest 05/01/2016 FINDINGS: Compare to the prior plain film there is unchanged size of the cardiac silhouette. Calcifications of the aortic arch. Interval placement of right upper extremity PICC with the tip appearing to terminate at the superior cavoatrial junction. Increasing opacity at the right base with thickening of the minor fissure, obscuration the right hemidiaphragm and right heart border. Interlobular septal thickening. IMPRESSION: Evidence of CHF with increasing right-sided pleural effusion/atelectasis. Unchanged cardiac silhouette in this patient with a history of pericardial effusion imaged on prior CT 05/01/2016. Interval placement of right upper extremity PICC. Aortic atherosclerosis. Signed, Yvone Neu. Loreta Ave, DO [facility] Electronically Signed   By: Gilmer Mor D.O.   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 Dilated cardiomyopathy. Acute kidney injury. EXAM: PORTABLE CHEST 1 VIEW COMPARISON:  None. FINDINGS: Marked cardiac enlargement noted. Diffuse interstitial infiltrates consistent with mild interstitial edema. Small right pleural effusion also seen with right basilar atelectasis. IMPRESSION: Mild congestive heart failure, with small right pleural effusion and right basilar atelectasis. Electronically Signed   By: Myles Rosenthal M.D.   On: 08/25/2016 15:14    PHYSICAL EXAM General: NAD Neck:  JVP 9 no thyromegaly or thyroid nodule.  Lungs: Clear  CV: Nondisplaced PMI.  Heart mildly tachy, irregular S1/S2, no S3/S4, 3/6 crescendo-decrescendo murmur RUSB. Trace-1+ ankle edema.  Pericardial drain in place Abdomen: Soft, NT, ND, no HSM. No bruits or masses. +BS  Neurologic: Alert and oriented x 3.  Psych: Normal affect. Extremities: No clubbing or cyanosis. RUE PICC  TELEMETRY: Reviewed, A fib 100-110s   ASSESSMENT AND PLAN: 65 y/o ?with a h/o severe AS, LV dysfxn, HTN, noncompliance, thyroid nodules, pericardial effusion, cholelithiasis, and persistent AF on eliquis, who presentedto clinic on 08/23/16 with volume overload and was admitted for further work up of AS and CHF.  1. Acute on chronic systolic CHF: EF 16-10% in setting of severe aortic stenosis.  May be due to severe aortic stenosis given lack of flow-limiting CAD.  This admission, he was initially hypotensive with SBP in 80s though BP has improved to 100s-110s range.  He was also markedly volume overloaded. PICC was placed, low output confirmed  by co-ox 47%.  Milrinone 0.25 started then increased to 0.375, co-ox 61% yesterday morning.  Creatinine 1.5 today.  CVP 7.  Now on po Lasix - Continue Lasix 60 mg po bid. - Continue milrinone 0.375 mcg/kg/min, likely will continue through TAVR.   - Hypokalemia improved - Continue spiro 25 mg daily.   - Continue digoxin, check level tomorrow 2. Aortic stenosis: Severe AS.  Will need AVR, likely best suited for TAVR.   -  Drs. McAlhany and Cornelius Moras have seen. Had gated CTA abdomen/pelvis this am. Watch renal function.   3. AKI on CKD stage III: Baseline creatinine 1.7-1.8.  Stable at 1.5 today.  - Continue to follow closely with po Lasix and contrastexposure with CTs.  4. Atrial fibrillation: Persistent, with RVR this admission.  He has been in atrial fibrillation at least since 6/17.  No prior DCCV.  Not sure how long prior to 6/17 he was in atrial fibrillation.  - Continue amiodarone  gtt for now for rate control, HR now in 100s at rest with milrinone running.  - Continue heparin gtt (was on apixaban at home).  - Continue digoxin. - Plan for DCCV post-TAVR, hopefully off milrinone at that point.  5. Pericardial effusion: Moderate to large, no tamponade.   - s/p pericardiocentesis with Dr Clifton James 10/20. 775 ccs fluid out. Fluid is transudative.  - 550 cc drained last 24 hours. Will keep drain in today until fluid < 50 CC/ 24 hours 6. Cirrhosis: Noted on prior abdominal CT, LFTs improved with diuresis.  7. CAD:  LHC with moderate coronary disease, nothing flow-limiting.  - Continue statin and 81 mg aspirin.   Arvilla Meres MD 09/03/2016 12:17 PM

## 2016-09-03 NOTE — Progress Notes (Signed)
Attempted to draw am labs via patient's Right upper arm PICC,  but after several attempts, had another RN assist me and we decided to changed the PICC dressing using sterile technique and attempt to draw after without success. Charge RN notified and lab called, also placed an order for IV team to assess, PICC flushes well and IVF's running without difficulty but unable to get blood return just a flashback, discussed need for Cathflow to attempt to clean line of any clots but he will have to have at least 2 other IV peripheral lines placed since the fluids that are running via the PICC are unable to be stopped for 2 hours or more to let the med lie in the catheter, will let AM nurse know and will address with MD in AM, labs will be drawn via lab tech at this time and they were notified. Patient has been NPO for CT Angio ordered for today, no other changes noted at this time, will continue to monitor.

## 2016-09-03 NOTE — Progress Notes (Signed)
VASCULAR LAB PRELIMINARY  PRELIMINARY  PRELIMINARY  PRELIMINARY  Carotid duplex completed.    Preliminary report:  No significant stenosis noted.  Vertebral artery flow is antegrade.   Nyan Dufresne, RVT 09/03/2016, 11:09 AM

## 2016-09-04 ENCOUNTER — Inpatient Hospital Stay (HOSPITAL_COMMUNITY): Payer: Commercial Managed Care - HMO

## 2016-09-04 ENCOUNTER — Encounter (HOSPITAL_COMMUNITY): Payer: Self-pay | Admitting: Cardiovascular Disease

## 2016-09-04 DIAGNOSIS — I313 Pericardial effusion (noninflammatory): Secondary | ICD-10-CM

## 2016-09-04 DIAGNOSIS — I4891 Unspecified atrial fibrillation: Secondary | ICD-10-CM

## 2016-09-04 LAB — COOXEMETRY PANEL
CARBOXYHEMOGLOBIN: 1.5 % (ref 0.5–1.5)
METHEMOGLOBIN: 0.8 % (ref 0.0–1.5)
O2 SAT: 59.9 %
TOTAL HEMOGLOBIN: 12 g/dL (ref 12.0–16.0)

## 2016-09-04 LAB — CBC
HCT: 37.4 % — ABNORMAL LOW (ref 39.0–52.0)
HEMOGLOBIN: 11.8 g/dL — AB (ref 13.0–17.0)
MCH: 25.9 pg — ABNORMAL LOW (ref 26.0–34.0)
MCHC: 31.6 g/dL (ref 30.0–36.0)
MCV: 82.2 fL (ref 78.0–100.0)
PLATELETS: 134 10*3/uL — AB (ref 150–400)
RBC: 4.55 MIL/uL (ref 4.22–5.81)
RDW: 18.1 % — ABNORMAL HIGH (ref 11.5–15.5)
WBC: 8.2 10*3/uL (ref 4.0–10.5)

## 2016-09-04 LAB — VAS US CAROTID
LCCADSYS: -46 cm/s
LCCAPDIAS: 14 cm/s
LCCAPSYS: 51 cm/s
LEFT ECA DIAS: -1 cm/s
LEFT VERTEBRAL DIAS: 7 cm/s
LICADDIAS: -17 cm/s
LICAPDIAS: -15 cm/s
Left CCA dist dias: -6 cm/s
Left ICA dist sys: -45 cm/s
Left ICA prox sys: -42 cm/s
RIGHT ECA DIAS: -1 cm/s
RIGHT VERTEBRAL DIAS: -13 cm/s
Right CCA prox sys: 48 cm/s
Right cca dist sys: -63 cm/s

## 2016-09-04 LAB — SURGICAL PCR SCREEN
MRSA, PCR: NEGATIVE
Staphylococcus aureus: NEGATIVE

## 2016-09-04 LAB — BASIC METABOLIC PANEL
ANION GAP: 6 (ref 5–15)
BUN: 16 mg/dL (ref 6–20)
CALCIUM: 8.5 mg/dL — AB (ref 8.9–10.3)
CO2: 27 mmol/L (ref 22–32)
CREATININE: 1.43 mg/dL — AB (ref 0.61–1.24)
Chloride: 99 mmol/L — ABNORMAL LOW (ref 101–111)
GFR calc Af Amer: 57 mL/min — ABNORMAL LOW (ref >60)
GFR calc non Af Amer: 49 mL/min — ABNORMAL LOW (ref >60)
GLUCOSE: 116 mg/dL — AB (ref 65–99)
Potassium: 3.9 mmol/L (ref 3.5–5.1)
Sodium: 132 mmol/L — ABNORMAL LOW (ref 135–145)

## 2016-09-04 LAB — CARBOXYHEMOGLOBIN - COOX

## 2016-09-04 LAB — HEPARIN LEVEL (UNFRACTIONATED)
HEPARIN UNFRACTIONATED: 0.16 [IU]/mL — AB (ref 0.30–0.70)
Heparin Unfractionated: 0.25 IU/mL — ABNORMAL LOW (ref 0.30–0.70)

## 2016-09-04 LAB — DIGOXIN LEVEL: Digoxin Level: 0.5 ng/mL — ABNORMAL LOW (ref 0.8–2.0)

## 2016-09-04 MED ORDER — POTASSIUM CHLORIDE 2 MEQ/ML IV SOLN
80.0000 meq | INTRAVENOUS | Status: DC
  Filled 2016-09-04: qty 40

## 2016-09-04 MED ORDER — DOPAMINE-DEXTROSE 3.2-5 MG/ML-% IV SOLN
0.0000 ug/kg/min | INTRAVENOUS | Status: DC
  Filled 2016-09-04: qty 250

## 2016-09-04 MED ORDER — FENTANYL CITRATE (PF) 100 MCG/2ML IJ SOLN
25.0000 ug | Freq: Once | INTRAMUSCULAR | Status: AC
  Administered 2016-09-04: 25 ug via INTRAVENOUS

## 2016-09-04 MED ORDER — MAGNESIUM SULFATE 50 % IJ SOLN
40.0000 meq | INTRAMUSCULAR | Status: DC
  Filled 2016-09-04: qty 10

## 2016-09-04 MED ORDER — SODIUM CHLORIDE 0.9 % IV SOLN
INTRAVENOUS | Status: DC
  Filled 2016-09-04: qty 30

## 2016-09-04 MED ORDER — NITROGLYCERIN IN D5W 200-5 MCG/ML-% IV SOLN
2.0000 ug/min | INTRAVENOUS | Status: DC
  Filled 2016-09-04: qty 250

## 2016-09-04 MED ORDER — DEXTROSE 5 % IV SOLN: 1.5000 g | INTRAVENOUS | Status: DC

## 2016-09-04 MED ORDER — SODIUM CHLORIDE 0.9 % IV SOLN
INTRAVENOUS | Status: DC
  Administered 2016-09-04: 12:00:00 via INTRAVENOUS

## 2016-09-04 MED ORDER — TEMAZEPAM 15 MG PO CAPS
15.0000 mg | ORAL_CAPSULE | Freq: Once | ORAL | Status: AC | PRN
  Administered 2016-09-04: 15 mg via ORAL
  Filled 2016-09-04: qty 1

## 2016-09-04 MED ORDER — METOPROLOL TARTRATE 12.5 MG HALF TABLET
12.5000 mg | ORAL_TABLET | Freq: Once | ORAL | Status: AC
  Administered 2016-09-05: 12.5 mg via ORAL
  Filled 2016-09-04: qty 1

## 2016-09-04 MED ORDER — HEPARIN (PORCINE) IN NACL 100-0.45 UNIT/ML-% IJ SOLN
1900.0000 [IU]/h | INTRAMUSCULAR | Status: DC
  Administered 2016-09-05: 1900 [IU]/h via INTRAVENOUS
  Filled 2016-09-04: qty 250

## 2016-09-04 MED ORDER — CHLORHEXIDINE GLUCONATE 0.12 % MT SOLN
15.0000 mL | Freq: Once | OROMUCOSAL | Status: AC
  Administered 2016-09-05: 15 mL via OROMUCOSAL
  Filled 2016-09-04: qty 15

## 2016-09-04 MED ORDER — MIDAZOLAM HCL 2 MG/2ML IJ SOLN
1.0000 mg | Freq: Once | INTRAMUSCULAR | Status: AC
  Administered 2016-09-04: 1 mg via INTRAVENOUS

## 2016-09-04 MED ORDER — FENTANYL CITRATE (PF) 100 MCG/2ML IJ SOLN: 50.0000 ug | Freq: Once | INTRAMUSCULAR | Status: DC

## 2016-09-04 MED ORDER — BISACODYL 5 MG PO TBEC
5.0000 mg | DELAYED_RELEASE_TABLET | Freq: Once | ORAL | Status: DC
  Filled 2016-09-04: qty 1

## 2016-09-04 MED ORDER — CHLORHEXIDINE GLUCONATE CLOTH 2 % EX PADS
6.0000 | MEDICATED_PAD | Freq: Once | CUTANEOUS | Status: AC
  Administered 2016-09-04: 6 via TOPICAL

## 2016-09-04 MED ORDER — PHENYLEPHRINE HCL 10 MG/ML IJ SOLN
30.0000 ug/min | INTRAVENOUS | Status: DC
  Filled 2016-09-04: qty 2

## 2016-09-04 MED ORDER — DEXMEDETOMIDINE HCL IN NACL 400 MCG/100ML IV SOLN
0.1000 ug/kg/h | INTRAVENOUS | Status: DC
  Filled 2016-09-04: qty 100

## 2016-09-04 MED ORDER — DIAZEPAM 5 MG PO TABS
5.0000 mg | ORAL_TABLET | Freq: Once | ORAL | Status: AC
  Administered 2016-09-05: 5 mg via ORAL
  Filled 2016-09-04: qty 1

## 2016-09-04 MED ORDER — FENTANYL CITRATE (PF) 100 MCG/2ML IJ SOLN
INTRAMUSCULAR | Status: AC
  Administered 2016-09-04: 25 ug via INTRAVENOUS
  Filled 2016-09-04: qty 2

## 2016-09-04 MED ORDER — VANCOMYCIN HCL 10 G IV SOLR
1250.0000 mg | INTRAVENOUS | Status: DC
  Filled 2016-09-04: qty 1250

## 2016-09-04 MED ORDER — VANCOMYCIN HCL 10 G IV SOLR: 1250.0000 mg | INTRAVENOUS | Status: DC

## 2016-09-04 MED ORDER — EPINEPHRINE PF 1 MG/ML IJ SOLN
0.0000 ug/min | INTRAVENOUS | Status: DC
  Filled 2016-09-04: qty 4

## 2016-09-04 MED ORDER — ALPRAZOLAM 0.25 MG PO TABS: 0.2500 mg | ORAL_TABLET | ORAL | Status: DC | PRN

## 2016-09-04 MED ORDER — NOREPINEPHRINE BITARTRATE 1 MG/ML IV SOLN
0.0000 ug/min | INTRAVENOUS | Status: DC
  Filled 2016-09-04: qty 4

## 2016-09-04 MED ORDER — DEXTROSE 5 % IV SOLN
1.5000 g | INTRAVENOUS | Status: DC
  Filled 2016-09-04: qty 1.5

## 2016-09-04 MED ORDER — CHLORHEXIDINE GLUCONATE CLOTH 2 % EX PADS: 6.0000 | MEDICATED_PAD | Freq: Once | CUTANEOUS | Status: AC

## 2016-09-04 MED ORDER — INSULIN REGULAR HUMAN 100 UNIT/ML IJ SOLN
INTRAMUSCULAR | Status: DC
  Filled 2016-09-04: qty 2.5

## 2016-09-04 MED ORDER — MIDAZOLAM HCL 2 MG/2ML IJ SOLN
INTRAMUSCULAR | Status: AC
  Administered 2016-09-04: 1 mg via INTRAVENOUS
  Filled 2016-09-04: qty 2

## 2016-09-04 MED ORDER — SODIUM CHLORIDE 0.9 % IV SOLN
INTRAVENOUS | Status: DC
  Filled 2016-09-04: qty 2.5

## 2016-09-04 NOTE — H&P (View-Only) (Signed)
Patient ID: Xyler Terpening, male   DOB: 1951/01/20, 65 y.o.   MRN: [id number] Patient ID: Mohmed Farver, male   DOB: May 29, 1951, 65 y.o.   MRN: [id number]    SUBJECTIVE:   PICC placed , initial co-ox on 10/14 47%.  He was started on milrinone. Milrinone increased to 0.375 mcg 08/29/16 for low mixed venous sat on RHC (CI 1.9).  Underwent pericardiocentesis on 10/20 with 775cc out. Fluid transudative. Drained another 225 cc yesterday  Feeling good generally. Denies SOB. CVP 6  Today's CO-OX 60%   Creatinine stable at 1.43  Being planned for TAVR on Tuesday. Dr. Cornelius Moras has seen. Has had CTs done, concern for LA appendage thrombus based on CT.   RHC/LHC (08/29/16) Left Main  Mild ostial narrowing.  Left Anterior Descending  30% proximal LAD stenosis. 60% distal LAD stenosis. Moderate D1 without significant disease. Small to moderate D2 with long 70% ostial stenosis.  Left Circumflex  50% proximal LCx stenosis at OM1. Moderate OM1 with 30% ostial stenosis.  Right Coronary Artery  40% proximal and 40% mid RCA stenosis.   RA mean 19 RV 65/15 PA 69/34, mean 51 PCWP mean 43 (not sure we got an accurate PCWP).  AO 106/87 Oxygen saturations: PA 46% AO 92% Cardiac Output (Fick) 3.8  Cardiac Index (Fick) 1.9  Echo (10/14): EF 15%, severe AS, moderately decreased RV systolic function, PASP 65 mmHg, moderate to large pericardial effusion without tamponade.   Scheduled Meds: . aspirin  81 mg Oral Daily  . atorvastatin  40 mg Oral q1800  . digoxin  0.125 mg Oral Daily  . famotidine  10 mg Oral BID  . furosemide  60 mg Oral BID  . polyethylene glycol  17 g Oral Daily  . sodium chloride flush  10-40 mL Intracatheter Q12H  . sodium chloride flush  3 mL Intravenous Q12H  . spironolactone  25 mg Oral Daily   Continuous Infusions: . amiodarone 30 mg/hr (09/04/16 0011)  . heparin 1,450 Units/hr (09/04/16 0011)  . milrinone 0.375 mcg/kg/min (09/04/16 0810)  . norepinephrine (LEVOPHED) Adult infusion      PRN Meds:.sodium chloride, acetaminophen, alum & mag hydroxide-simeth, docusate sodium, lactulose, ondansetron (ZOFRAN) IV, oxyCODONE-acetaminophen, sodium chloride flush, sodium chloride flush   Vitals:   09/03/16 2357 09/04/16 0000 09/04/16 0400 09/04/16 0409  BP: 102/82 97/82 (!) 82/64 (!) 82/64  Pulse: 77 (!) 42 94 (!) 53  Resp: 20 20 14 16   Temp: 98.9 F (37.2 C)   98.3 F (36.8 C)  TempSrc: Oral   Oral  SpO2: 93% 94% 93% 98%  Weight:    181 lb 10.5 oz (82.4 kg)  Height:        Intake/Output Summary (Last 24 hours) at 09/04/16 0813 Last data filed at 09/04/16 0700  Gross per 24 hour  Intake          1736.01 ml  Output             2055 ml  Net          -318.99 ml    LABS: Basic Metabolic Panel:  Recent Labs  78/29/56 0528 09/04/16 0427  NA 133* 132*  K 3.9 3.9  CL 98* 99*  CO2 26 27  GLUCOSE 102* 116*  BUN 17 16  CREATININE 1.59* 1.43*  CALCIUM 8.7* 8.5*   Liver Function Tests: No results for input(s): AST, ALT, ALKPHOS, BILITOT, PROT, ALBUMIN in the last 72 hours. No results for input(s): LIPASE, AMYLASE in the last 72 hours.  CBC:  Recent Labs  09/03/16 0528 09/04/16 0427  WBC 10.2 8.2  HGB 12.5* 11.8*  HCT 38.9* 37.4*  MCV 82.4 82.2  PLT 124* 134*   Cardiac Enzymes: No results for input(s): CKTOTAL, CKMB, CKMBINDEX, TROPONINI in the last 72 hours. BNP: Invalid input(s): POCBNP D-Dimer: No results for input(s): DDIMER in the last 72 hours. Hemoglobin A1C: No results for input(s): HGBA1C in the last 72 hours. Fasting Lipid Panel: No results for input(s): CHOL, HDL, LDLCALC, TRIG, CHOLHDL, LDLDIRECT in the last 72 hours. Thyroid Function Tests: No results for input(s): TSH, T4TOTAL, T3FREE, THYROIDAB in the last 72 hours.  Invalid input(s): FREET3 Anemia Panel: No results for input(s): VITAMINB12, FOLATE, FERRITIN, TIBC, IRON, RETICCTPCT in the last 72 hours.  RADIOLOGY: Ct Cardiac Morph/pulm Vein W/cm&w/o Ca Score  Addendum Date:  09/01/2016   ADDENDUM REPORT: 09/01/2016 17:18 CLINICAL DATA:  Aortic stenosis EXAM: Cardiac TAVR CT TECHNIQUE: The patient was scanned on a Philips 256 scanner. A 120 kV retrospective scan was triggered in the descending thoracic aorta at 111 HU's. Gantry rotation speed was 270 msecs and collimation was .9 mm. No beta blockade or nitro were given. The 3D data set was reconstructed in 5% intervals of the R-R cycle. Systolic and diastolic phases were analyzed on a dedicated work station using MPR, MIP and VRT modes. The patient received 80 cc of contrast. 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 Heavily calcified Aorta:  Aortic root dilated Sinotubular Junction:  32 mm Ascending Thoracic Aorta:  43 mm Aortic Arch:  30 mm Descending Thoracic Aorta:  26 mm Sinus of Valsalva Measurements: Non-coronary:  33 mm Right -coronary:  31 mm Left -coronary:  32 mm Coronary Artery Height above Annulus: Left Main:  19 mm above annulus Right Coronary:  10 mm above annulus Virtual Basal Annulus Measurements: Maximum/Minimum Diameter:  27.1 x 20.3 mm Perimeter:  78 mm Area:  442 mm2 Coronary Arteries:  Sufficient height above annulus for deployment Optimum Fluoroscopic Angle for Delivery:  LAO 29 degrees IMPRESSION: 1) functionally bicuspid AV heavily calcified with annulus suitable for 26 mm Sapien 3 valve 2) Moderate aortic root enlargement 4.3 cm 3) Large pericardial effusion patient to have pericardiocentesis post CT 4) Cannot r/o LAA thrombus suggest TEE correlation 5) Optimum angiographic angle for deployment LAO 29 degrees 6) Coronary heights sufficient for deployment Charlton Haws Electronically Signed   By: Charlton Haws M.D.   On: 09/01/2016 17:18   Result Date: 09/01/2016 EXAM: OVER-READ INTERPRETATION  CT CHEST The following report is an over-read performed by radiologist Dr. Royal Piedra [facility] on 09/01/2016. This over-read does not include interpretation of  cardiac or coronary anatomy or pathology. The coronary calcium score/coronary CTA interpretation by the cardiologist is attached. COMPARISON:  Chest CT 05/01/2016. FINDINGS: Large pericardial effusion. No pericardial calcification. Aortic atherosclerosis with aneurysmal dilatation of the ascending thoracic aorta which measures up to 4.8 cm in diameter (unchanged). Large right-sided pleural effusion layering dependently. Extensive passive atelectasis in the right lower lobe. Tiny calcified granuloma in the periphery of the right lower lobe. 4 mm subpleural noncalcified pulmonary nodule in the left lower lobe abutting the major fissure (image 71 of series 512), unchanged compared to prior study 05/01/2016, favored to represent a subpleural lymph node. No other suspicious appearing pulmonary nodules or masses are noted in the visualized portions of the thorax. Partially calcified pleural plaque in the right hemithorax (image 31 of series 513) again noted. No calcified pleural  plaques in the left hemithorax. Visualized portions of the upper abdomen demonstrate a nodular contour of the liver, suggestive of underlying cirrhosis. No aggressive appearing lytic or blastic lesions are noted in the visualized portions of the skeleton. Right upper extremity PICC with tip terminating in the distal superior vena cava. IMPRESSION: 1. Interval increased size of what is now a large pericardial effusion. No associated pericardial calcification. 2. Enlargement of a large layering right-sided pleural effusion. This is associated with extensive passive atelectasis in the right lower lobe. 3. Aortic atherosclerosis, with similar aneurysmal dilatation of the ascending thoracic aorta which measures up to 4.8 cm in diameter. 4. Morphologic changes in the liver indicative of cirrhosis, as above. 5. 4 mm subpleural nodule in the periphery of the left lower lobe is nonspecific, but statistically likely a subpleural lymph node. No follow-up  needed if patient is low-risk. Non-contrast chest CT can be considered in 12 months if patient is high-risk. This recommendation follows the consensus statement: Guidelines for Management of Incidental Pulmonary Nodules Detected on CT Images: From the Fleischner Society 2017; Radiology 2017; 284:228-243. 6. Additional incidental findings, as above. Electronically Signed: By: Trudie Reed M.D. On: 09/01/2016 14:38   Dg Chest Port 1 View  Result Date: 08/26/2016 CLINICAL DATA:  65 year old male with a history of shortness of breath EXAM: PORTABLE CHEST 1 VIEW COMPARISON:  08/25/2016, CT chest 05/01/2016 FINDINGS: Compare to the prior plain film there is unchanged size of the cardiac silhouette. Calcifications of the aortic arch. Interval placement of right upper extremity PICC with the tip appearing to terminate at the superior cavoatrial junction. Increasing opacity at the right base with thickening of the minor fissure, obscuration the right hemidiaphragm and right heart border. Interlobular septal thickening. IMPRESSION: Evidence of CHF with increasing right-sided pleural effusion/atelectasis. Unchanged cardiac silhouette in this patient with a history of pericardial effusion imaged on prior CT 05/01/2016. Interval placement of right upper extremity PICC. Aortic atherosclerosis. Signed, Yvone Neu. Loreta Ave, DO [facility] Electronically Signed   By: Gilmer Mor D.O.   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 Dilated cardiomyopathy. Acute kidney injury. EXAM: PORTABLE CHEST 1 VIEW COMPARISON:  None. FINDINGS: Marked cardiac enlargement noted. Diffuse interstitial infiltrates consistent with mild interstitial edema. Small right pleural effusion also seen with right basilar atelectasis. IMPRESSION: Mild congestive heart failure, with small right pleural  effusion and right basilar atelectasis. Electronically Signed   By: Myles Rosenthal M.D.   On: 08/25/2016 15:14   Ct Angio Chest/abd/pel For Dissection W And/or W/wo  Result Date: 09/03/2016 CLINICAL DATA:  65 year old male with history of severe aortic stenosis. Preprocedural study prior to potential transcatheter aortic valve replacement. Recent history of pericardiocentesis. EXAM: CT ANGIOGRAPHY CHEST, ABDOMEN AND PELVIS TECHNIQUE: Multidetector CT imaging through the chest, abdomen and pelvis was performed using the standard protocol during bolus administration of intravenous contrast. Multiplanar reconstructed images and MIPs were obtained and reviewed to evaluate the vascular anatomy. CONTRAST:  80 mL of Isovue 370. COMPARISON:  Cardiac gated CTA 09/01/2016. FINDINGS: CTA CHEST FINDINGS Cardiovascular: Heart size is mildly enlarged. Small amount of residual pericardial fluid and/or thickening, decreased compared to the prior study. Pericardiocentesis catheter in place with tip terminating high in a superior pericardial recess adjacent to the aortic arch. No pericardial calcification. Severe thickening calcification of the aortic valve, which has an appearance suggestive of a bicuspid  valve. There is aortic atherosclerosis, as well as atherosclerosis of the great vessels of the mediastinum and the coronary arteries, including calcified atherosclerotic plaque in the left anterior descending and right coronary arteries. 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 Large filling defect within the left atrial appendage, concerning for potential left atrial appendage thrombus. Right upper extremity PICC with tip terminating at the superior cavoatrial junction. Mediastinum/Lymph Nodes: Several borderline enlarged and minimally enlarged mediastinal lymph nodes measuring up to 14 mm in short axis, slightly increased compared to the prior study, likely reactive. Esophagus is  unremarkable in appearance. No axillary lymphadenopathy. Lungs/Pleura: Large right pleural effusion again noted, lying dependently. This is associated with near complete passive atelectasis of the right lower lobe. Trace left pleural effusion with minimal passive subsegmental atelectasis in the dependent left lower lobe. No acute consolidative airspace disease. Previously noted tiny subpleural known nodule in the anterior aspect of the left lower lobe is no longer noted, presumably a reactive subpleural lymph node on the prior examination. Partially calcified small right-sided pleural plaque is unchanged (image 63 of series 5). No other calcified pleural plaques are identified. Tiny calcified granulomas are again noted in the periphery of the right lower lobe, now with an atelectatic lung. Musculoskeletal/Soft Tissues: There are no aggressive appearing lytic or blastic lesions noted in the visualized portions of the skeleton. CTA ABDOMEN AND PELVIS FINDINGS Hepatobiliary: Liver has a shrunken appearance and nodular contour, compatible with underlying cirrhosis. No discrete cystic or solid hepatic lesions. No intra or extrahepatic biliary ductal dilatation. Large rim calcified gallstone in the gallbladder measuring up to 3.8 cm in diameter. No current findings to suggest an acute cholecystitis at this time. Pancreas: No pancreatic mass or peripancreatic inflammatory changes. No pancreatic ductal dilatation. Pancreatic atrophy. No pancreatic or peripancreatic fluid. Spleen: Unremarkable. Adrenals/Urinary Tract: Bilateral kidneys and bilateral adrenal glands are unremarkable in appearance. No hydroureteronephrosis. In the dependent portion of the urinary bladder there is a 12 mm calculus (image 254 of series 5). Urinary bladder is otherwise unremarkable in appearance. Stomach/Bowel: Normal appearance of the stomach. No pathologic dilatation of small bowel or colon. Numerous colonic diverticulae are noted, without  surrounding inflammatory changes to suggest an acute diverticulitis at this time. The appendix is not confidently identified and may be surgically absent. Regardless, there are no inflammatory changes noted adjacent to the cecum to suggest the presence of an acute appendicitis at this time. Vascular/Lymphatic: Aortic atherosclerosis with vascular findings and measurements pertinent to potential TAVR procedure, as detailed below. No aneurysm or dissection identified in the abdominal or pelvic vasculature. Celiac axis, superior mesenteric artery and inferior mesenteric artery are all widely patent without hemodynamically significant stenosis. Single renal arteries bilaterally are widely patent. Portal vein is mildly dilated measuring 16 mm in the porta hepatis. Reproductive: Prostate gland and seminal vesicles are unremarkable in appearance. Other: Small left inguinal hernia containing a short segment of small bowel. No significant volume of ascites. No pneumoperitoneum. Musculoskeletal: There are no aggressive appearing lytic or blastic lesions noted in the visualized portions of the skeleton. VASCULAR MEASUREMENTS PERTINENT TO TAVR: AORTA: Minimal Aortic Diameter -  18 x 14 mm Severity of Aortic Calcification -  moderate RIGHT PELVIS: Right Common Iliac Artery - Minimal Diameter - 11.0 x 9.1 mm Tortuosity - moderate Calcification - mild to moderate Right External Iliac Artery - Minimal Diameter - 8.7 x 8.9 mm Tortuosity - moderate to severe Calcification - none Right Common Femoral Artery - Minimal  Diameter - 9.2 x 6.8 mm Tortuosity - mild Calcification - mild LEFT PELVIS: Left Common Iliac Artery - Minimal Diameter - 9.5 x 10.0 mm Tortuosity - severe Calcification - none Left External Iliac Artery - Minimal Diameter - 8.1 x 7.9 mm Tortuosity - moderate Calcification - none Left Common Femoral Artery - Minimal Diameter - 8.5 x 6.3 mm Tortuosity - mild Calcification - mild Review of the MIP images confirms the above  findings. IMPRESSION: 1. Vascular findings and measurements pertinent to potential TAVR procedure, as detailed above. This patient appears to have suitable pelvic arterial access, however, this access is most optimal on the right side secondary to the lesser degree of tortuosity. Additionally, there is a small left inguinal hernia containing a short segment of small bowel which comes in close proximity to the left common femoral artery. 2. Severe thickening calcification of what appears to be a bicuspid aortic valve, compatible with the reported clinical history of severe aortic stenosis. 3. Interval pericardiocentesis with persistent indwelling pericardiocentesis catheter, with significant decreased size of what is now only a small amount of residual pericardial fluid and/or thickening. 4. Large filling defect in the tip of the left atrial appendage, concerning for potential left atrial appendage thrombus (although this could simply reflect pseudo thrombus secondary to contrast bolus timing). Correlation with TEE is recommended, if not already performed. 5. Aortic atherosclerosis, in addition to 2 vessel coronary artery disease. Please note that although the presence of coronary artery calcium documents the presence of coronary artery disease, the severity of this disease and any potential stenosis cannot be assessed on this non-gated CT examination. Assessment for potential risk factor modification, dietary therapy or pharmacologic therapy may be warranted, if clinically indicated. 6. Persistent large right-sided pleural effusion with extensive passive atelectasis in the right lower lobe. Trace left pleural effusion with minimal dependent passive atelectasis in the left lower lobe. 7. Cholelithiasis without evidence of acute cholecystitis. 8. Colonic diverticulosis without evidence of acute diverticulitis at this time. 9. Morphologic changes in the liver compatible with underlying cirrhosis. 10. Additional  incidental findings, as above. Electronically Signed   By: Trudie Reedaniel  Entrikin M.D.   On: 09/03/2016 12:24    PHYSICAL EXAM General: NAD Neck: JVP 7, no thyromegaly or thyroid nodule.  Lungs: Clear  CV: Nondisplaced PMI.  Heart mildly tachy, irregular S1/S2, no S3/S4, 3/6 crescendo-decrescendo murmur RUSB. Trace-1+ ankle edema.  Pericardial drain in place Abdomen: Soft, NT, ND, no HSM. No bruits or masses. +BS  Neurologic: Alert and oriented x 3.  Psych: Normal affect. Extremities: No clubbing or cyanosis. RUE PICC  TELEMETRY: Reviewed, A fib 100-110s   ASSESSMENT AND PLAN: 65 y/o ?with a h/o severe AS, LV dysfxn, HTN, noncompliance, thyroid nodules, pericardial effusion, cholelithiasis, and persistent AF on eliquis, who presentedto clinic on 08/23/16 with volume overload and was admitted for further work up of AS and CHF.  1. Acute on chronic systolic CHF: EF 16-10%20-25% in setting of severe aortic stenosis.  May be due to severe aortic stenosis given lack of flow-limiting CAD.  This admission, he was initially hypotensive with SBP in 80s though BP has improved to 100s-110s range.  He was also markedly volume overloaded. PICC was placed, low output confirmed by co-ox 47%.  Milrinone 0.25 started then increased to 0.375, co-ox 60% today.  Creatinine 1.43 today.  CVP 6.  Now on po Lasix - Continue Lasix 60 mg po bid. - Continue milrinone 0.375 mcg/kg/min, likely will continue through TAVR.   -  Continue spiro 25 mg daily.   - Continue digoxin, level ok today.  2. Aortic stenosis: Severe AS.  Will need AVR, likely best suited for TAVR.   - Drs. McAlhany and Cornelius Moras have seen. Has had CTs. - Concern for LAA thrombus on CT.  He is on heparin gtt.  Will evaluate with TEE today before TAVR.    3. AKI on CKD stage III: Baseline creatinine 1.7-1.8.  Stable at 1.43 today.  - Continue to follow closely with po Lasix and contrast exposure with CTs.  4. Atrial fibrillation: Persistent, with RVR this admission.   He has been in atrial fibrillation at least since 6/17.  No prior DCCV.  Not sure how long prior to 6/17 he was in atrial fibrillation.  - Continue amiodarone gtt for now for rate control, HR now in 100s at rest with milrinone running.  - Continue heparin gtt (was on apixaban at home).  - Continue digoxin. - Plan for DCCV post-TAVR, hopefully off milrinone at that point.  5. Pericardial effusion: Moderate to large, no tamponade.  s/p pericardiocentesis with Dr Clifton James 10/20. 775 ccs fluid out. Fluid is transudative.  - 225 cc drained last 24 hours. Will keep drain in today until fluid < 50 CC/ 24 hours (hopefully pull tomorrow).  6. Cirrhosis: Noted on prior abdominal CT, LFTs improved with diuresis.  7. CAD:  LHC with moderate coronary disease, nothing flow-limiting.  - Continue statin and 81 mg aspirin.   Marca Ancona MD 09/04/2016 8:13 AM

## 2016-09-04 NOTE — Progress Notes (Signed)
Patient ID: Lucas Munoz, male   DOB: 1951/01/20, 65 y.o.   MRN: [id number] Patient ID: Lucas Munoz, male   DOB: May 29, 1951, 65 y.o.   MRN: [id number]    SUBJECTIVE:   PICC placed , initial co-ox on 10/14 47%.  He was started on milrinone. Milrinone increased to 0.375 mcg 08/29/16 for low mixed venous sat on RHC (CI 1.9).  Underwent pericardiocentesis on 10/20 with 775cc out. Fluid transudative. Drained another 225 cc yesterday  Feeling good generally. Denies SOB. CVP 6  Today's CO-OX 60%   Creatinine stable at 1.43  Being planned for TAVR on Tuesday. Dr. Cornelius Moras has seen. Has had CTs done, concern for LA appendage thrombus based on CT.   RHC/LHC (08/29/16) Left Main  Mild ostial narrowing.  Left Anterior Descending  30% proximal LAD stenosis. 60% distal LAD stenosis. Moderate D1 without significant disease. Small to moderate D2 with long 70% ostial stenosis.  Left Circumflex  50% proximal LCx stenosis at OM1. Moderate OM1 with 30% ostial stenosis.  Right Coronary Artery  40% proximal and 40% mid RCA stenosis.   RA mean 19 RV 65/15 PA 69/34, mean 51 PCWP mean 43 (not sure we got an accurate PCWP).  AO 106/87 Oxygen saturations: PA 46% AO 92% Cardiac Output (Fick) 3.8  Cardiac Index (Fick) 1.9  Echo (10/14): EF 15%, severe AS, moderately decreased RV systolic function, PASP 65 mmHg, moderate to large pericardial effusion without tamponade.   Scheduled Meds: . aspirin  81 mg Oral Daily  . atorvastatin  40 mg Oral q1800  . digoxin  0.125 mg Oral Daily  . famotidine  10 mg Oral BID  . furosemide  60 mg Oral BID  . polyethylene glycol  17 g Oral Daily  . sodium chloride flush  10-40 mL Intracatheter Q12H  . sodium chloride flush  3 mL Intravenous Q12H  . spironolactone  25 mg Oral Daily   Continuous Infusions: . amiodarone 30 mg/hr (09/04/16 0011)  . heparin 1,450 Units/hr (09/04/16 0011)  . milrinone 0.375 mcg/kg/min (09/04/16 0810)  . norepinephrine (LEVOPHED) Adult infusion      PRN Meds:.sodium chloride, acetaminophen, alum & mag hydroxide-simeth, docusate sodium, lactulose, ondansetron (ZOFRAN) IV, oxyCODONE-acetaminophen, sodium chloride flush, sodium chloride flush   Vitals:   09/03/16 2357 09/04/16 0000 09/04/16 0400 09/04/16 0409  BP: 102/82 97/82 (!) 82/64 (!) 82/64  Pulse: 77 (!) 42 94 (!) 53  Resp: 20 20 14 16   Temp: 98.9 F (37.2 C)   98.3 F (36.8 C)  TempSrc: Oral   Oral  SpO2: 93% 94% 93% 98%  Weight:    181 lb 10.5 oz (82.4 kg)  Height:        Intake/Output Summary (Last 24 hours) at 09/04/16 0813 Last data filed at 09/04/16 0700  Gross per 24 hour  Intake          1736.01 ml  Output             2055 ml  Net          -318.99 ml    LABS: Basic Metabolic Panel:  Recent Labs  78/29/56 0528 09/04/16 0427  NA 133* 132*  K 3.9 3.9  CL 98* 99*  CO2 26 27  GLUCOSE 102* 116*  BUN 17 16  CREATININE 1.59* 1.43*  CALCIUM 8.7* 8.5*   Liver Function Tests: No results for input(s): AST, ALT, ALKPHOS, BILITOT, PROT, ALBUMIN in the last 72 hours. No results for input(s): LIPASE, AMYLASE in the last 72 hours.  CBC:  Recent Labs  09/03/16 0528 09/04/16 0427  WBC 10.2 8.2  HGB 12.5* 11.8*  HCT 38.9* 37.4*  MCV 82.4 82.2  PLT 124* 134*   Cardiac Enzymes: No results for input(s): CKTOTAL, CKMB, CKMBINDEX, TROPONINI in the last 72 hours. BNP: Invalid input(s): POCBNP D-Dimer: No results for input(s): DDIMER in the last 72 hours. Hemoglobin A1C: No results for input(s): HGBA1C in the last 72 hours. Fasting Lipid Panel: No results for input(s): CHOL, HDL, LDLCALC, TRIG, CHOLHDL, LDLDIRECT in the last 72 hours. Thyroid Function Tests: No results for input(s): TSH, T4TOTAL, T3FREE, THYROIDAB in the last 72 hours.  Invalid input(s): FREET3 Anemia Panel: No results for input(s): VITAMINB12, FOLATE, FERRITIN, TIBC, IRON, RETICCTPCT in the last 72 hours.  RADIOLOGY: Ct Cardiac Morph/pulm Vein W/cm&w/o Ca Score  Addendum Date:  09/01/2016   ADDENDUM REPORT: 09/01/2016 17:18 CLINICAL DATA:  Aortic stenosis EXAM: Cardiac TAVR CT TECHNIQUE: The patient was scanned on a Philips 256 scanner. A 120 kV retrospective scan was triggered in the descending thoracic aorta at 111 HU's. Gantry rotation speed was 270 msecs and collimation was .9 mm. No beta blockade or nitro were given. The 3D data set was reconstructed in 5% intervals of the R-R cycle. Systolic and diastolic phases were analyzed on a dedicated work station using MPR, MIP and VRT modes. The patient received 80 cc of contrast. 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 Heavily calcified Aorta:  Aortic root dilated Sinotubular Junction:  32 mm Ascending Thoracic Aorta:  43 mm Aortic Arch:  30 mm Descending Thoracic Aorta:  26 mm Sinus of Valsalva Measurements: Non-coronary:  33 mm Right -coronary:  31 mm Left -coronary:  32 mm Coronary Artery Height above Annulus: Left Main:  19 mm above annulus Right Coronary:  10 mm above annulus Virtual Basal Annulus Measurements: Maximum/Minimum Diameter:  27.1 x 20.3 mm Perimeter:  78 mm Area:  442 mm2 Coronary Arteries:  Sufficient height above annulus for deployment Optimum Fluoroscopic Angle for Delivery:  LAO 29 degrees IMPRESSION: 1) functionally bicuspid AV heavily calcified with annulus suitable for 26 mm Sapien 3 valve 2) Moderate aortic root enlargement 4.3 cm 3) Large pericardial effusion patient to have pericardiocentesis post CT 4) Cannot r/o LAA thrombus suggest TEE correlation 5) Optimum angiographic angle for deployment LAO 29 degrees 6) Coronary heights sufficient for deployment Charlton Haws Electronically Signed   By: Charlton Haws M.D.   On: 09/01/2016 17:18   Result Date: 09/01/2016 EXAM: OVER-READ INTERPRETATION  CT CHEST The following report is an over-read performed by radiologist Dr. Royal Piedra [facility] on 09/01/2016. This over-read does not include interpretation of  cardiac or coronary anatomy or pathology. The coronary calcium score/coronary CTA interpretation by the cardiologist is attached. COMPARISON:  Chest CT 05/01/2016. FINDINGS: Large pericardial effusion. No pericardial calcification. Aortic atherosclerosis with aneurysmal dilatation of the ascending thoracic aorta which measures up to 4.8 cm in diameter (unchanged). Large right-sided pleural effusion layering dependently. Extensive passive atelectasis in the right lower lobe. Tiny calcified granuloma in the periphery of the right lower lobe. 4 mm subpleural noncalcified pulmonary nodule in the left lower lobe abutting the major fissure (image 71 of series 512), unchanged compared to prior study 05/01/2016, favored to represent a subpleural lymph node. No other suspicious appearing pulmonary nodules or masses are noted in the visualized portions of the thorax. Partially calcified pleural plaque in the right hemithorax (image 31 of series 513) again noted. No calcified pleural  plaques in the left hemithorax. Visualized portions of the upper abdomen demonstrate a nodular contour of the liver, suggestive of underlying cirrhosis. No aggressive appearing lytic or blastic lesions are noted in the visualized portions of the skeleton. Right upper extremity PICC with tip terminating in the distal superior vena cava. IMPRESSION: 1. Interval increased size of what is now a large pericardial effusion. No associated pericardial calcification. 2. Enlargement of a large layering right-sided pleural effusion. This is associated with extensive passive atelectasis in the right lower lobe. 3. Aortic atherosclerosis, with similar aneurysmal dilatation of the ascending thoracic aorta which measures up to 4.8 cm in diameter. 4. Morphologic changes in the liver indicative of cirrhosis, as above. 5. 4 mm subpleural nodule in the periphery of the left lower lobe is nonspecific, but statistically likely a subpleural lymph node. No follow-up  needed if patient is low-risk. Non-contrast chest CT can be considered in 12 months if patient is high-risk. This recommendation follows the consensus statement: Guidelines for Management of Incidental Pulmonary Nodules Detected on CT Images: From the Fleischner Society 2017; Radiology 2017; 284:228-243. 6. Additional incidental findings, as above. Electronically Signed: By: Trudie Reed M.D. On: 09/01/2016 14:38   Dg Chest Port 1 View  Result Date: 08/26/2016 CLINICAL DATA:  65 year old male with a history of shortness of breath EXAM: PORTABLE CHEST 1 VIEW COMPARISON:  08/25/2016, CT chest 05/01/2016 FINDINGS: Compare to the prior plain film there is unchanged size of the cardiac silhouette. Calcifications of the aortic arch. Interval placement of right upper extremity PICC with the tip appearing to terminate at the superior cavoatrial junction. Increasing opacity at the right base with thickening of the minor fissure, obscuration the right hemidiaphragm and right heart border. Interlobular septal thickening. IMPRESSION: Evidence of CHF with increasing right-sided pleural effusion/atelectasis. Unchanged cardiac silhouette in this patient with a history of pericardial effusion imaged on prior CT 05/01/2016. Interval placement of right upper extremity PICC. Aortic atherosclerosis. Signed, Yvone Neu. Loreta Ave, DO [facility] Electronically Signed   By: Gilmer Mor D.O.   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 Dilated cardiomyopathy. Acute kidney injury. EXAM: PORTABLE CHEST 1 VIEW COMPARISON:  None. FINDINGS: Marked cardiac enlargement noted. Diffuse interstitial infiltrates consistent with mild interstitial edema. Small right pleural effusion also seen with right basilar atelectasis. IMPRESSION: Mild congestive heart failure, with small right pleural  effusion and right basilar atelectasis. Electronically Signed   By: Myles Rosenthal M.D.   On: 08/25/2016 15:14   Ct Angio Chest/abd/pel For Dissection W And/or W/wo  Result Date: 09/03/2016 CLINICAL DATA:  65 year old male with history of severe aortic stenosis. Preprocedural study prior to potential transcatheter aortic valve replacement. Recent history of pericardiocentesis. EXAM: CT ANGIOGRAPHY CHEST, ABDOMEN AND PELVIS TECHNIQUE: Multidetector CT imaging through the chest, abdomen and pelvis was performed using the standard protocol during bolus administration of intravenous contrast. Multiplanar reconstructed images and MIPs were obtained and reviewed to evaluate the vascular anatomy. CONTRAST:  80 mL of Isovue 370. COMPARISON:  Cardiac gated CTA 09/01/2016. FINDINGS: CTA CHEST FINDINGS Cardiovascular: Heart size is mildly enlarged. Small amount of residual pericardial fluid and/or thickening, decreased compared to the prior study. Pericardiocentesis catheter in place with tip terminating high in a superior pericardial recess adjacent to the aortic arch. No pericardial calcification. Severe thickening calcification of the aortic valve, which has an appearance suggestive of a bicuspid  valve. There is aortic atherosclerosis, as well as atherosclerosis of the great vessels of the mediastinum and the coronary arteries, including calcified atherosclerotic plaque in the left anterior descending and right coronary arteries. 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 Large filling defect within the left atrial appendage, concerning for potential left atrial appendage thrombus. Right upper extremity PICC with tip terminating at the superior cavoatrial junction. Mediastinum/Lymph Nodes: Several borderline enlarged and minimally enlarged mediastinal lymph nodes measuring up to 14 mm in short axis, slightly increased compared to the prior study, likely reactive. Esophagus is  unremarkable in appearance. No axillary lymphadenopathy. Lungs/Pleura: Large right pleural effusion again noted, lying dependently. This is associated with near complete passive atelectasis of the right lower lobe. Trace left pleural effusion with minimal passive subsegmental atelectasis in the dependent left lower lobe. No acute consolidative airspace disease. Previously noted tiny subpleural known nodule in the anterior aspect of the left lower lobe is no longer noted, presumably a reactive subpleural lymph node on the prior examination. Partially calcified small right-sided pleural plaque is unchanged (image 63 of series 5). No other calcified pleural plaques are identified. Tiny calcified granulomas are again noted in the periphery of the right lower lobe, now with an atelectatic lung. Musculoskeletal/Soft Tissues: There are no aggressive appearing lytic or blastic lesions noted in the visualized portions of the skeleton. CTA ABDOMEN AND PELVIS FINDINGS Hepatobiliary: Liver has a shrunken appearance and nodular contour, compatible with underlying cirrhosis. No discrete cystic or solid hepatic lesions. No intra or extrahepatic biliary ductal dilatation. Large rim calcified gallstone in the gallbladder measuring up to 3.8 cm in diameter. No current findings to suggest an acute cholecystitis at this time. Pancreas: No pancreatic mass or peripancreatic inflammatory changes. No pancreatic ductal dilatation. Pancreatic atrophy. No pancreatic or peripancreatic fluid. Spleen: Unremarkable. Adrenals/Urinary Tract: Bilateral kidneys and bilateral adrenal glands are unremarkable in appearance. No hydroureteronephrosis. In the dependent portion of the urinary bladder there is a 12 mm calculus (image 254 of series 5). Urinary bladder is otherwise unremarkable in appearance. Stomach/Bowel: Normal appearance of the stomach. No pathologic dilatation of small bowel or colon. Numerous colonic diverticulae are noted, without  surrounding inflammatory changes to suggest an acute diverticulitis at this time. The appendix is not confidently identified and may be surgically absent. Regardless, there are no inflammatory changes noted adjacent to the cecum to suggest the presence of an acute appendicitis at this time. Vascular/Lymphatic: Aortic atherosclerosis with vascular findings and measurements pertinent to potential TAVR procedure, as detailed below. No aneurysm or dissection identified in the abdominal or pelvic vasculature. Celiac axis, superior mesenteric artery and inferior mesenteric artery are all widely patent without hemodynamically significant stenosis. Single renal arteries bilaterally are widely patent. Portal vein is mildly dilated measuring 16 mm in the porta hepatis. Reproductive: Prostate gland and seminal vesicles are unremarkable in appearance. Other: Small left inguinal hernia containing a short segment of small bowel. No significant volume of ascites. No pneumoperitoneum. Musculoskeletal: There are no aggressive appearing lytic or blastic lesions noted in the visualized portions of the skeleton. VASCULAR MEASUREMENTS PERTINENT TO TAVR: AORTA: Minimal Aortic Diameter -  18 x 14 mm Severity of Aortic Calcification -  moderate RIGHT PELVIS: Right Common Iliac Artery - Minimal Diameter - 11.0 x 9.1 mm Tortuosity - moderate Calcification - mild to moderate Right External Iliac Artery - Minimal Diameter - 8.7 x 8.9 mm Tortuosity - moderate to severe Calcification - none Right Common Femoral Artery - Minimal  Diameter - 9.2 x 6.8 mm Tortuosity - mild Calcification - mild LEFT PELVIS: Left Common Iliac Artery - Minimal Diameter - 9.5 x 10.0 mm Tortuosity - severe Calcification - none Left External Iliac Artery - Minimal Diameter - 8.1 x 7.9 mm Tortuosity - moderate Calcification - none Left Common Femoral Artery - Minimal Diameter - 8.5 x 6.3 mm Tortuosity - mild Calcification - mild Review of the MIP images confirms the above  findings. IMPRESSION: 1. Vascular findings and measurements pertinent to potential TAVR procedure, as detailed above. This patient appears to have suitable pelvic arterial access, however, this access is most optimal on the right side secondary to the lesser degree of tortuosity. Additionally, there is a small left inguinal hernia containing a short segment of small bowel which comes in close proximity to the left common femoral artery. 2. Severe thickening calcification of what appears to be a bicuspid aortic valve, compatible with the reported clinical history of severe aortic stenosis. 3. Interval pericardiocentesis with persistent indwelling pericardiocentesis catheter, with significant decreased size of what is now only a small amount of residual pericardial fluid and/or thickening. 4. Large filling defect in the tip of the left atrial appendage, concerning for potential left atrial appendage thrombus (although this could simply reflect pseudo thrombus secondary to contrast bolus timing). Correlation with TEE is recommended, if not already performed. 5. Aortic atherosclerosis, in addition to 2 vessel coronary artery disease. Please note that although the presence of coronary artery calcium documents the presence of coronary artery disease, the severity of this disease and any potential stenosis cannot be assessed on this non-gated CT examination. Assessment for potential risk factor modification, dietary therapy or pharmacologic therapy may be warranted, if clinically indicated. 6. Persistent large right-sided pleural effusion with extensive passive atelectasis in the right lower lobe. Trace left pleural effusion with minimal dependent passive atelectasis in the left lower lobe. 7. Cholelithiasis without evidence of acute cholecystitis. 8. Colonic diverticulosis without evidence of acute diverticulitis at this time. 9. Morphologic changes in the liver compatible with underlying cirrhosis. 10. Additional  incidental findings, as above. Electronically Signed   By: Trudie Reedaniel  Entrikin M.D.   On: 09/03/2016 12:24    PHYSICAL EXAM General: NAD Neck: JVP 7, no thyromegaly or thyroid nodule.  Lungs: Clear  CV: Nondisplaced PMI.  Heart mildly tachy, irregular S1/S2, no S3/S4, 3/6 crescendo-decrescendo murmur RUSB. Trace-1+ ankle edema.  Pericardial drain in place Abdomen: Soft, NT, ND, no HSM. No bruits or masses. +BS  Neurologic: Alert and oriented x 3.  Psych: Normal affect. Extremities: No clubbing or cyanosis. RUE PICC  TELEMETRY: Reviewed, A fib 100-110s   ASSESSMENT AND PLAN: 65 y/o ?with a h/o severe AS, LV dysfxn, HTN, noncompliance, thyroid nodules, pericardial effusion, cholelithiasis, and persistent AF on eliquis, who presentedto clinic on 08/23/16 with volume overload and was admitted for further work up of AS and CHF.  1. Acute on chronic systolic CHF: EF 16-10%20-25% in setting of severe aortic stenosis.  May be due to severe aortic stenosis given lack of flow-limiting CAD.  This admission, he was initially hypotensive with SBP in 80s though BP has improved to 100s-110s range.  He was also markedly volume overloaded. PICC was placed, low output confirmed by co-ox 47%.  Milrinone 0.25 started then increased to 0.375, co-ox 60% today.  Creatinine 1.43 today.  CVP 6.  Now on po Lasix - Continue Lasix 60 mg po bid. - Continue milrinone 0.375 mcg/kg/min, likely will continue through TAVR.   -  Continue spiro 25 mg daily.   - Continue digoxin, level ok today.  2. Aortic stenosis: Severe AS.  Will need AVR, likely best suited for TAVR.   - Drs. McAlhany and Cornelius Moras have seen. Has had CTs. - Concern for LAA thrombus on CT.  He is on heparin gtt.  Will evaluate with TEE today before TAVR.    3. AKI on CKD stage III: Baseline creatinine 1.7-1.8.  Stable at 1.43 today.  - Continue to follow closely with po Lasix and contrast exposure with CTs.  4. Atrial fibrillation: Persistent, with RVR this admission.   He has been in atrial fibrillation at least since 6/17.  No prior DCCV.  Not sure how long prior to 6/17 he was in atrial fibrillation.  - Continue amiodarone gtt for now for rate control, HR now in 100s at rest with milrinone running.  - Continue heparin gtt (was on apixaban at home).  - Continue digoxin. - Plan for DCCV post-TAVR, hopefully off milrinone at that point.  5. Pericardial effusion: Moderate to large, no tamponade.  s/p pericardiocentesis with Dr Clifton James 10/20. 775 ccs fluid out. Fluid is transudative.  - 225 cc drained last 24 hours. Will keep drain in today until fluid < 50 CC/ 24 hours (hopefully pull tomorrow).  6. Cirrhosis: Noted on prior abdominal CT, LFTs improved with diuresis.  7. CAD:  LHC with moderate coronary disease, nothing flow-limiting.  - Continue statin and 81 mg aspirin.   Marca Ancona MD 09/04/2016 8:13 AM

## 2016-09-04 NOTE — Interval H&P Note (Signed)
History and Physical Interval Note:  09/04/2016 1:13 PM  Lucas Munoz  has presented today for surgery, with the diagnosis of pericardial affusion  The various methods of treatment have been discussed with the patient and family. After consideration of risks, benefits and other options for treatment, the patient has consented to  Procedure(s): Pericardiocentesis (N/A) as a surgical intervention .  The patient's history has been reviewed, patient examined, no change in status, stable for surgery.  I have reviewed the patient's chart and labs.  Questions were answered to the patient's satisfaction.     Keila Turan [employer]

## 2016-09-04 NOTE — Progress Notes (Signed)
ANTICOAGULATION CONSULT NOTE - FOLLOW UP    HL = 0.16 (goal 0.3 - 0.7 units/mL) Heparin dosing weight = 87 kg   Assessment: 65 YOM on Eliquis PTA for history of Afib.  He was transitioned to IV heparin for pericardiocentesis on 09/01/16 with a drain placement, and patient to undergo TAVR tomorrow AM.    Heparin level trended down further despite rate increases.  TEE showed large amount of amorphous material suggesting forming/developing thrombus; therefore, Pharmacy asked to aim for the upper end of normal.  No issue with heparin infusion per RN.  No bleeding reported.   Plan: - Increase heparin gtt to 1900 units/hr, no bolus per MD - Check 6 hr heparin level - F/U post OR   Sheyann Sulton D. Laney Potash, PharmD, BCPS 09/04/2016, 7:31 PM

## 2016-09-04 NOTE — Consult Note (Addendum)
[facility]  MULTIDISCIPLINARY HEART VALVE CLINIC  CARDIOTHORACIC SURGERY CONSULTATION REPORT  Referring Provider is Marca Ancona, MD PCP is [name], [name], MD  Reason for consultation: Severe aortic stenosis with severe LV dysfunction  HPI:  The patient is a 65 year old gentleman with hypertension, cardiomyopathy and chronic systolic and diastolic heart failure, persistent atrial fibrillation on Eliquis, chronic pulmonary embolism, stage 3 CKD and known severe aortic stenosis who had an echo in 04/2016 showing an EF of 20-25% with a mean AV gradient of 40 mm Hg. He reports have a heart murmur diagnosed when he had a physical exam after being drafted for the [place] War which kept him out of the service. There was never any further work up until he saw Dr. Tomie China in [place] about 2 years ago for development of shortness of breath and swelling in his legs. He underwent an extensive evaluation by Dr. Tomie China in [place] and was diagnosed with severe aortic stenosis, congestive heart failure, and atrial fibrillation. He was told that he should have surgery but declined. He says that he lost confidence in his medical care there because he kept having recurring episodes of shortness of breath and edema. He sought a second opinion from Dr. Tresa Endo in May 2017. At that time he was in atrial fibrillation but not on anticoagulation.  Eliquis was started and the patient was treated for congestive heart failure using diuretics. His echo was as noted above and showed a moderate pericardial effusion and some dilation of the ascending aorta with a question of an aortic dissection. CT angiogram of the chest showed aneurysmal dilatation of the ascending aorta with a maximum transverse diameter of 4.8 cm but no evidence for aortic dissection. He was noted to have a pulmonary embolus in a small segmental branch of the right lower lobe. He was seen again on 08/15/2016 complaining of worsening  symptoms of shortness of breath, lower extremity edema, and early satiety.  He was not taking his Lasix as previously prescribed and his weight was up about 20 pounds in comparison to his last previous office visit.  His medications were adjusted but symptoms did not significantly improve so he was admitted to the hospital 08/24/2016 for intravenous diuretic therapy and further management. Repeat echocardiogram revealed further deterioration in the ejection fraction to 15%.  There remained a moderate to large size circumferential pericardial effusion without evidence for pericardial tamponade.  The patient was started on intravenous milrinone and aggressively diuresed. Cardiac catheterization on 08/29/2016 showed moderate nonobstructive coronary artery disease with low cardiac output and mixed venous oxygen saturation with elevated filling pressures.  Milrinone was increased to .375. The patient responded well to diuretic therapy and his weight has come down 20 pounds since admission. He  underwent pericardiocentesis removing 750 mL of thin serous fluid. He says that he has felt much better since then and is able to eat, sleep, lie down flat and ambulate.  Past Medical History:  Diagnosis Date  . Cardiomyopathy (HCC)    a. 04/2016 Echo: EF 20-25%.  . Cataract    right side per patient  . Cholelithiasis    a. 03/2016 Abd CT: 4.1 x 3.4 cm gallstone and other tiny stones in the gb fundus-->referred to GI.  Marland Kitchen Chronic bronchitis (HCC)   . Chronic pulmonary embolism (HCC)    a. 04/2016 CTA Chest: Sequelae of chronic pulm thromboembolism in RLL. No acute PE.  Marland Kitchen Chronic systolic CHF (congestive heart failure) (HCC)  a. 04/2016 Echo: EF 20-25%, mod LVH, diff HK w/ anterior, anteroseptal, and apical AK, sev AS (mean grad 40 mmHg, peak grad 66 mmHg), Asc Ao diameter 45 mm - ? flap (not seen on f/u CTA), mild MR, reduced RV fxn, sev dil RA, mod TR, PASP 50 mmHg, small to mod circumferential pericardial effusion.  Marland Kitchen.  GERD (gastroesophageal reflux disease)   . Heart murmur dx'd 1968  . Hepatic cirrhosis (HCC)    a. 03/2016 Abd CT: evidence for hepatic cirrhosis.  . Hypertension   . Hypertensive heart disease with heart failure (HCC)   . Lower extremity edema    a. 04/2016 LE U/S: No DVT. Bilat greater saphenous vein incompetence.  . Multiple thyroid nodules    a. 05/2016 Thyroid U/S: Bilat nodules & cysts, largest in left thyroid lobe, 1.4 cm; solid nodule along the medial right thyroid lobe measuring 1.0 cm - rec u/s guided needle bx.  . Pericardial effusion    a. 03/2016 Abd CT: mod to large pericardial effusion; b. 04/2016 Echo: small to mod circumferential pericardial effusion.  . Persistent atrial fibrillation (HCC)    a. On eliquis (CHA2DS2VASc = 3).  . Pleural effusion on right    a. 03/2016 incidental finding on abd CT; b. 04/2016 R pl effusion noted on CTA Chest.  . Severe aortic stenosis     Past Surgical History:  Procedure Laterality Date  . BACK SURGERY    . CARDIAC CATHETERIZATION N/A 08/29/2016   Procedure: Right/Left Heart Cath and Coronary Angiography;  Surgeon: Laurey Moralealton S McLean, MD;  [facility];  Service: Cardiovascular;  Laterality: N/A;  . CARDIAC CATHETERIZATION N/A 09/01/2016   Procedure: Pericardiocentesis;  Surgeon: Kathleene Hazelhristopher D McAlhany, MD;  [facility];  Service: Cardiovascular;  Laterality: N/A;  . goiter removed  ~ 1958  . LUMBAR DISC SURGERY  1968   "herniated disc"  . TONSILLECTOMY  1960    Family History  Problem Relation Age of Onset  . Cancer Mother   . Heart disease Father   . Alcohol abuse Brother   . Cancer Brother   . Cancer Maternal Grandmother   . Alcohol abuse Maternal Grandfather   . Alcohol abuse Paternal Grandfather     Social History   Social History  . Marital status: Divorced    Spouse name: N/A  . Number of children: N/A  . Years of education: N/A   Occupational History  . Not on file.   Social History  Main Topics  . Smoking status: Former Smoker    Packs/day: 1.00    Years: 30.00    Types: Cigarettes    Quit date: 04/05/2000  . Smokeless tobacco: Never Used  . Alcohol use 0.0 oz/week     Comment: 08/24/2016 "stopped drinking in 1990"  . Drug use: No  . Sexual activity: Not Currently   Other Topics Concern  . Not on file   Social History Narrative  . No narrative on file    Current Facility-Administered Medications  Medication Dose Route Frequency Provider Last Rate Last Dose  . 0.9 %  sodium chloride infusion  250 mL Intravenous PRN Laurey Moralealton S McLean, MD      . acetaminophen (TYLENOL) tablet 650 mg  650 mg Oral Q4H PRN Ok Anishristopher R Berge, NP   650 mg at 09/02/16 2326  . alum & mag hydroxide-simeth (MAALOX/MYLANTA) 200-200-20 MG/5ML suspension 15 mL  15 mL Oral Q4H PRN Laurey Moralealton S McLean, MD   15 mL  at 09/01/16 1941  . amiodarone (NEXTERONE PREMIX) 360-4.14 MG/200ML-% (1.8 mg/mL) IV infusion  30 mg/hr Intravenous Continuous Graciella Freer, PA-C 16.7 mL/hr at 09/04/16 0011 30 mg/hr at 09/04/16 0011  . aspirin chewable tablet 81 mg  81 mg Oral Daily Lennette Bihari, MD   81 mg at 09/03/16 1123  . atorvastatin (LIPITOR) tablet 40 mg  40 mg Oral q1800 Amy D Clegg, NP   40 mg at 09/03/16 1756  . digoxin (LANOXIN) tablet 0.125 mg  0.125 mg Oral Daily Laurey Morale, MD   0.125 mg at 09/03/16 1125  . docusate sodium (COLACE) capsule 100 mg  100 mg Oral Daily PRN Laurey Morale, MD   100 mg at 08/31/16 0156  . famotidine (PEPCID) tablet 10 mg  10 mg Oral BID Laurey Morale, MD   10 mg at 09/03/16 2117  . furosemide (LASIX) tablet 60 mg  60 mg Oral BID Mariam Dollar Tillery, PA-C   60 mg at 09/04/16 0809  . heparin ADULT infusion 100 units/mL (25000 units/223mL sodium chloride 0.45%)  1,450 Units/hr Intravenous Continuous Emi Holes, RPH 14.5 mL/hr at 09/04/16 0011 1,450 Units/hr at 09/04/16 0011  . lactulose (CHRONULAC) 10 GM/15ML solution 20 g  20 g Oral Daily PRN Kathleene Hazel, MD   20 g at 08/31/16 1323  . milrinone (PRIMACOR) 20 MG/100 ML (0.2 mg/mL) infusion  0.375 mcg/kg/min Intravenous Continuous Laurey Morale, MD 10.3 mL/hr at 09/04/16 0810 0.375 mcg/kg/min at 09/04/16 0810  . norepinephrine (LEVOPHED) 4 mg in dextrose 5 % 250 mL (0.016 mg/mL) infusion  0-40 mcg/min Intravenous Titrated Mariam Dollar Tillery, PA-C      . ondansetron [facility]) injection 4 mg  4 mg Intravenous Q6H PRN Ok Anis, NP   4 mg at 09/01/16 2215  . oxyCODONE-acetaminophen (PERCOCET/ROXICET) 5-325 MG per tablet 1 tablet  1 tablet Oral Q4H PRN Leone Brand, NP   1 tablet at 09/04/16 0809  . polyethylene glycol (MIRALAX / GLYCOLAX) packet 17 g  17 g Oral Daily Amy D Clegg, NP   17 g at 09/03/16 1122  . sodium chloride flush (NS) 0.9 % injection 10-40 mL  10-40 mL Intracatheter Q12H Lennette Bihari, MD   10 mL at 09/03/16 2118  . sodium chloride flush (NS) 0.9 % injection 10-40 mL  10-40 mL Intracatheter PRN Lennette Bihari, MD      . sodium chloride flush (NS) 0.9 % injection 3 mL  3 mL Intravenous Q12H Laurey Morale, MD   3 mL at 09/03/16 2118  . sodium chloride flush (NS) 0.9 % injection 3 mL  3 mL Intravenous PRN Laurey Morale, MD      . spironolactone (ALDACTONE) tablet 25 mg  25 mg Oral Daily Amy D Clegg, NP   25 mg at 09/03/16 1123    Allergies  Allergen Reactions  . Potassium-Containing Compounds Other (See Comments)    Upset stomach      Review of Systems:    General:                      improved appetite, decreased energy, + weight gain, + weight loss, no fever             Cardiac:                       no chest pain with exertion, no chest pain at rest, +SOB  with exertion, + resting SOB, no PND, + orthopnea, no palpitations, + arrhythmia, + atrial fibrillation, + LE edema, no dizzy spells, no syncope             Respiratory:                 + shortness of breath, no home oxygen, no productive cough, no dry cough, no bronchitis, no wheezing, no  hemoptysis, no asthma, no pain with inspiration or cough, no sleep apnea, no CPAP at night             GI:                               no difficulty swallowing, no reflux, no frequent heartburn, no hiatal hernia, + abdominal pain, no constipation, no diarrhea, no hematochezia, no hematemesis, no melena             GU:                              no dysuria,  no frequency, no urinary tract infection, no hematuria, no enlarged prostate, no kidney stones, + kidney disease             Vascular:                     no pain suggestive of claudication, no pain in feet, no leg cramps, no varicose veins, no DVT, no non-healing foot ulcer             Neuro:                         no stroke, no TIA's, no seizures, no headaches, no temporary blindness one eye,  no slurred speech, no peripheral neuropathy, no chronic pain, no instability of gait, no memory/cognitive dysfunction             Musculoskeletal:         no arthritis, no joint swelling, no myalgias, no difficulty walking, normal mobility              Skin:                            no rash, no itching, no skin infections, no pressure sores or ulcerations             Psych:                         no anxiety, no depression, no nervousness, no unusual recent stress             Eyes:                           no blurry vision, no floaters, no recent vision changes, + wears glasses only for reading             ENT:                            no hearing loss, no loose or painful teeth, + upper dentures, last saw dentist approximately 1 year ago             Hematologic:  no easy bruising, no abnormal bleeding, no clotting disorder, no frequent epistaxis             Endocrine:                   no diabetes, does not check CBG's at home           Physical Exam:   BP 98/71 (BP Location: Left Arm)   Pulse (!) 112   Temp 97.7 F (36.5 C) (Oral)   Resp 16   Ht 5\' 8"  (1.727 m)   Wt 82.4 kg (181 lb 10.5 oz)   SpO2 94%   BMI 27.62 kg/m     General:  Thin, chronically ill-appearing in no distress  HEENT:  Unremarkable , NCAT, PERLA, EOMI, oropharynx clear. Remaining teeth on bottom with no obvious problems.  Neck:   no JVD, no bruits, no adenopathy or thyromegaly  Chest:   clear to auscultation, symmetrical breath sounds, no wheezes, no rhonchi   CV:   IRRR, grade III/VI crescendo/decrescendo murmur heard best at RSB,  no diastolic murmur  Abdomen:  soft, non-tender, no masses or organomegaly  Extremities:  warm, well-perfused, pulses palpable in feet, mild LE edema  Rectal/GU  Deferred  Neuro:   Grossly non-focal and symmetrical throughout  Skin:   Clean and dry, no rashes, no breakdown   Diagnostic Tests:             *[place]*                   *[facility]*                         [address]                        [place], [place] [address]                            [phone]  ------------------------------------------------------------------- Transthoracic Echocardiography  Patient:    Lucas Munoz, Lucas Munoz MR #:       [id number] Study Date: 08/26/2016 Gender:     M Age:        14 Height:     172.7 cm Weight:     91.2 kg BSA:        2.12 m^2 Pt. Status: Room:       [facility]   ADMITTING    Nicki Guadalajara, M.D.  ATTENDING    Nicki Guadalajara, M.D.  SONOGRAPHER  Arvil Chaco  PERFORMING   [facility], Inpatient  ORDERING     Graciella Freer  REFERRING    Graciella Freer  cc:  ------------------------------------------------------------------- LV EF: 15%  ------------------------------------------------------------------- Indications:      CHF - 428.0.  ------------------------------------------------------------------- History:   PMH:  AKI on CKD stage III. Atrial fibrillation with RVR. Medical noncompliance.  ------------------------------------------------------------------- Study Conclusions  - Left ventricle: The cavity size was normal. Wall thickness was    increased in a pattern of moderate LVH. The estimated ejection   fraction was 15%. Diffuse hypokinesis. Indeterminant diastolic   function (atrial fibrillation). - Aortic valve: Trileaflet; severely calcified leaflets. There was   severe stenosis. There was trivial regurgitation. Mean gradient   (S): 46 mm Hg. Valve area (VTI): 0.51 cm^2. - Aorta: Dilated ascending aorta, 4.3 cm. - Mitral valve: Mildly calcified annulus. There was mild   regurgitation. -  Left atrium: The atrium was moderately to severely dilated. - Right ventricle: The cavity size was normal. Systolic function   was moderately reduced. - Right atrium: The atrium was moderately dilated. - Tricuspid valve: There was moderate regurgitation. Peak RV-RA   gradient (S): 50 mm Hg. - Pulmonary arteries: PA peak pressure: 65 mm Hg (S). - Systemic veins: IVC measured 2.4 cm with < 50% respirophasic   variation, suggesting RA pressure 15 mmHg. - Pericardium, extracardiac: Moderate to large circumferential   pericardial effusion without tamponade.  Impressions:  - The patient was in atrial fibrillation. Normal LV size with   moderate LV hypertrophy. EF 15%, diffuse hypokinesis. Normal RV   size with moderately decreased systolic function. Biatrial   enlargement. Moderate TR, mild MR. There was severe aortic   stenosis. Moderate pulmonary hypertension. There was a moderate   to large circumferential pericardial effusion without evidence   for tamponade.  ------------------------------------------------------------------- Study data:  Comparison was made to the study of 04/28/2016.  Study status:  Routine.  Procedure:  The patient reported no pain pre or post test. Transthoracic echocardiography. Image quality was adequate.  Study completion:  There were no complications. Transthoracic echocardiography.  M-mode, complete 2D, spectral Doppler, and color Doppler.  Birthdate:  Patient birthdate: 1951/05/14.  Age:  Patient is  66 yr old.  Sex:  Gender: male. BMI: 30.6 kg/m^2.  Blood pressure:     97/86  Patient status: Inpatient.  Study date:  Study date: 08/26/2016. Study time: 10:58 AM.  Location:  ICU/CCU  -------------------------------------------------------------------  ------------------------------------------------------------------- Left ventricle:  The cavity size was normal. Wall thickness was increased in a pattern of moderate LVH. The estimated ejection fraction was 15%. Diffuse hypokinesis. Indeterminant diastolic function (atrial fibrillation).  ------------------------------------------------------------------- Aortic valve:   Trileaflet; severely calcified leaflets.  Doppler:  There was severe stenosis.   There was trivial regurgitation. VTI ratio of LVOT to aortic valve: 0.16. Valve area (VTI): 0.51 cm^2. Indexed valve area (VTI): 0.24 cm^2/m^2. Peak velocity ratio of LVOT to aortic valve: 0.18. Valve area (Vmax): 0.57 cm^2. Indexed valve area (Vmax): 0.27 cm^2/m^2. Mean velocity ratio of LVOT to aortic valve: 0.17. Valve area (Vmean): 0.53 cm^2. Indexed valve area (Vmean): 0.25 cm^2/m^2.    Mean gradient (S): 46 mm Hg.   ------------------------------------------------------------------- Aorta:  Dilated ascending aorta, 4.3 cm.  ------------------------------------------------------------------- Mitral valve:   Mildly calcified annulus.  Doppler:   There was no evidence for stenosis.   There was mild regurgitation.  ------------------------------------------------------------------- Left atrium:  The atrium was moderately to severely dilated.   ------------------------------------------------------------------- Right ventricle:  The cavity size was normal. Systolic function was moderately reduced.  ------------------------------------------------------------------- Pulmonic valve:    Structurally normal valve.   Cusp separation was normal.  Doppler:  Transvalvular velocity  was within the normal range. There was trivial regurgitation.  ------------------------------------------------------------------- Tricuspid valve:   Doppler:  There was moderate regurgitation.   ------------------------------------------------------------------- Right atrium:  The atrium was moderately dilated.  ------------------------------------------------------------------- Pericardium:  Moderate to large circumferential pericardial effusion without tamponade.  ------------------------------------------------------------------- Systemic veins:  IVC measured 2.4 cm with < 50% respirophasic variation, suggesting RA pressure 15 mmHg.  ------------------------------------------------------------------- Post procedure conclusions Ascending Aorta:  - Dilated ascending aorta, 4.3 cm.  ------------------------------------------------------------------- Measurements   Left ventricle                            Value          Reference  LV ID, ED,  PLAX chordal           (L)     39    mm       43 - 52  LV ID, ES, PLAX chordal                   35.1  mm       23 - 38  LV fx shortening, PLAX chordal    (L)     10    %        >=29  LV PW thickness, ED                       15.6  mm       ---------  IVS/LV PW ratio, ED                       0.96           <=1.3  Stroke volume, 2D                         39    ml       ---------  Stroke volume/bsa, 2D                     18    ml/m^2   ---------    Ventricular septum                        Value          Reference  IVS thickness, ED                         15    mm       ---------    LVOT                                      Value          Reference  LVOT ID, S                                20    mm       ---------  LVOT area                                 3.14  cm^2     ---------  LVOT peak velocity, S                     61.3  cm/s     ---------  LVOT mean velocity, S                     42.3  cm/s     ---------  LVOT VTI,  S                               12.5  cm       ---------    Aortic valve  Value          Reference  Aortic valve peak velocity, S             336   cm/s     ---------  Aortic valve mean velocity, S             252   cm/s     ---------  Aortic valve VTI, S                       77.7  cm       ---------  Aortic mean gradient, S                   35    mm Hg    ---------  VTI ratio, LVOT/AV                        0.16           ---------  Aortic valve area, VTI                    0.51  cm^2     ---------  Aortic valve area/bsa, VTI                0.24  cm^2/m^2 ---------  Velocity ratio, peak, LVOT/AV             0.18           ---------  Aortic valve area, peak velocity          0.57  cm^2     ---------  Aortic valve area/bsa, peak               0.27  cm^2/m^2 ---------  velocity  Velocity ratio, mean, LVOT/AV             0.17           ---------  Aortic valve area, mean velocity          0.53  cm^2     ---------  Aortic valve area/bsa, mean               0.25  cm^2/m^2 ---------  velocity    Aorta                                     Value          Reference  Aortic root ID, ED                        34    mm       ---------    Left atrium                               Value          Reference  LA ID, A-P, ES                            48    mm       ---------  LA ID/bsa, A-P                    (H)     2.27  cm/m^2   <=2.2  LA volume, S                              123   ml       ---------  LA volume/bsa, S                          58.1  ml/m^2   ---------  LA volume, ES, 1-p A4C                    101   ml       ---------  LA volume/bsa, ES, 1-p A4C                47.7  ml/m^2   ---------  LA volume, ES, 1-p A2C                    137   ml       ---------  LA volume/bsa, ES, 1-p A2C                64.7  ml/m^2   ---------    Pulmonary arteries                        Value          Reference  PA pressure, S, DP                (H)     65    mm Hg    <=30     Tricuspid valve                           Value          Reference  Tricuspid regurg peak velocity            354   cm/s     ---------  Tricuspid peak RV-RA gradient             50    mm Hg    ---------    Systemic veins                            Value          Reference  Estimated CVP                             15    mm Hg    ---------    Right ventricle                           Value          Reference  TAPSE                                     12.8  mm       ---------  RV s&', lateral, S                         8.81  cm/s     ---------    Pulmonic valve  Value          Reference  Pulmonic regurg velocity, ED              178   cm/s     ---------  Pulmonic regurg gradient, ED              13    mm Hg    ---------  Legend: (L)  and  (H)  mark values outside specified reference range.  ------------------------------------------------------------------- Prepared and Electronically Authenticated by  Marca Ancona, M.D. 2017-10-14T16:21:44  Joetta Manners  Cardiac catheterization  Order# [id number]  Reading physician: Laurey Morale, MD Ordering physician: Laurey Morale, MD Study date: 08/29/16  Physicians   Panel Physicians Referring Physician Case Authorizing Physician  Laurey Morale, MD (Primary)    Procedures   Right/Left Heart Cath and Coronary Angiography  Conclusion   1. Filling pressures remain elevated (though think PCWP was not accurately obtained) and cardiac index remains low.  2. Moderate coronary disease but nothing to approach interventionally.   - Continue diuresis.  - Increase milrinone to 0.375 - TAVR evaluation  Procedural Details/Technique   Technical Details Procedure: Right Heart Cath, Selective Coronary Angiography  Indication: Aortic stenosis.   Procedural Details: The right radial area and left brachial area were prepped, draped, and anesthetized with 1% lidocaine. There was a pre-existing peripheral IV in the  left brachial area that was replaced with a 95F venous sheath. A Swan-Ganz catheter was used for the right heart catheterization. The right radial artery was entered using modified Seldinger technique and a 17F sheath was placed. The patient received 3 mg IA verapamil and weight-based IV heparin. Standard protocol was followed for recording of right heart pressures and sampling of oxygen saturations. Fick cardiac output was calculated. Standard Judkins catheters were used for selective coronary angiography. There were no immediate procedural complications. The patient was transferred to the post catheterization recovery area for further monitoring.   Estimated blood loss <50 mL.  During this procedure the patient was administered the following to achieve and maintain moderate conscious sedation: Versed 2 mg, Fentanyl 75 mcg, while the patient's heart rate, blood pressure, and oxygen saturation were continuously monitored. The period of conscious sedation was 37 minutes, of which I was present face-to-face 100% of this time.    Coronary Findings   Dominance: Right  Left Main  Mild ostial narrowing.  Left Anterior Descending  30% proximal LAD stenosis. 60% distal LAD stenosis. Moderate D1 without significant disease. Small to moderate D2 with long 70% ostial stenosis.  Left Circumflex  50% proximal LCx stenosis at OM1. Moderate OM1 with 30% ostial stenosis.  Right Coronary Artery  40% proximal and 40% mid RCA stenosis.  Right Heart   Right Heart Pressures [facility] Procedural Findings: Hemodynamics (mmHg) RA mean 19 RV 65/15 PA 69/34, mean 51 PCWP mean 43 (not sure we got an accurate PCWP).  AO 106/87  Oxygen saturations: PA 46% AO 92%  Cardiac Output (Fick) 3.8  Cardiac Index (Fick) 1.9    Wall Motion   Not done, CKD.         Implants     No implant documentation for this case.  PACS Images   Show images for Cardiac catheterization   Link to Procedure Log   Procedure Log      Hemo Data   Flowsheet Row Most Recent Value  Fick Cardiac Output 3.8 L/min  Fick Cardiac Output Index 1.86 (L/min)/BSA  RA A Wave 21 mmHg  RA V Wave 22 mmHg  RA Mean 19 mmHg  RV Systolic Pressure 65 mmHg  RV Diastolic Pressure 10 mmHg  RV EDP 15 mmHg  PA Systolic Pressure 69 mmHg  PA Diastolic Pressure 34 mmHg  PA Mean 51 mmHg  PW A Wave 46 mmHg  PW V Wave 52 mmHg  PW Mean 40 mmHg  AO Systolic Pressure 106 mmHg  AO Diastolic Pressure 87 mmHg  AO Mean 94 mmHg  QP/QS 1  TPVR Index 27.35 HRUI  TSVR Index 50.41 HRUI  PVR SVR Ratio 0.11  TPVR/TSVR Ratio 0.54   ADDENDUM REPORT: 09/01/2016 17:18  CLINICAL DATA:  Aortic stenosis  EXAM: Cardiac TAVR CT  TECHNIQUE: The patient was scanned on a Philips 256 scanner. A 120 kV retrospective scan was triggered in the descending thoracic aorta at 111 HU's. Gantry rotation speed was 270 msecs and collimation was .9 mm. No beta blockade or nitro were given. The 3D data set was reconstructed in 5% intervals of the R-R cycle. Systolic and diastolic phases were analyzed on a dedicated work station using MPR, MIP and VRT modes. The patient received 80 cc of contrast.  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 Heavily calcified  Aorta:  Aortic root dilated  Sinotubular Junction:  32 mm  Ascending Thoracic Aorta:  43 mm  Aortic Arch:  30 mm  Descending Thoracic Aorta:  26 mm  Sinus of Valsalva Measurements:  Non-coronary:  33 mm  Right -coronary:  31 mm  Left -coronary:  32 mm  Coronary Artery Height above Annulus:  Left Main:  19 mm above annulus  Right Coronary:  10 mm above annulus  Virtual Basal Annulus Measurements:  Maximum/Minimum Diameter:  27.1 x 20.3 mm  Perimeter:  78 mm  Area:  442 mm2  Coronary Arteries:  Sufficient height above annulus for deployment  Optimum Fluoroscopic Angle for Delivery:  LAO 29 degrees  IMPRESSION: 1)  functionally bicuspid AV heavily calcified with annulus suitable for 26 mm Sapien 3 valve  2) Moderate aortic root enlargement 4.3 cm  3) Large pericardial effusion patient to have pericardiocentesis post CT  4) Cannot r/o LAA thrombus suggest TEE correlation  5) Optimum angiographic angle for deployment LAO 29 degrees  6) Coronary heights sufficient for deployment  Charlton Haws   Electronically Signed   By: Charlton Haws M.D.   On: 09/01/2016 17:18      CLINICAL DATA:  65 year old male with history of severe aortic stenosis. Preprocedural study prior to potential transcatheter aortic valve replacement. Recent history of pericardiocentesis.  EXAM: CT ANGIOGRAPHY CHEST, ABDOMEN AND PELVIS  TECHNIQUE: Multidetector CT imaging through the chest, abdomen and pelvis was performed using the standard protocol during bolus administration of intravenous contrast. Multiplanar reconstructed images and MIPs were obtained and reviewed to evaluate the vascular anatomy.  CONTRAST:  80 mL of Isovue 370.  COMPARISON:  Cardiac gated CTA 09/01/2016.  FINDINGS: CTA CHEST FINDINGS  Cardiovascular: Heart size is mildly enlarged. Small amount of residual pericardial fluid and/or thickening, decreased compared to the prior study. Pericardiocentesis catheter in place with tip terminating high in a superior pericardial recess adjacent to the aortic arch. No pericardial calcification. Severe thickening calcification of the aortic valve, which has an appearance suggestive of a bicuspid valve. There is aortic atherosclerosis, as well as atherosclerosis of the great vessels of the mediastinum and the coronary arteries, including calcified atherosclerotic plaque in the left anterior descending and right coronary arteries. 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 Large filling defect within the left atrial appendage, concerning for potential  left atrial appendage thrombus. Right upper extremity PICC with tip terminating at the superior cavoatrial junction.  Mediastinum/Lymph Nodes: Several borderline enlarged and minimally enlarged mediastinal lymph nodes measuring up to 14 mm in short axis, slightly increased compared to the prior study, likely reactive. Esophagus is unremarkable in appearance. No axillary lymphadenopathy.  Lungs/Pleura: Large right pleural effusion again noted, lying dependently. This is associated with near complete passive atelectasis of the right lower lobe. Trace left pleural effusion with minimal passive subsegmental atelectasis in the dependent left lower lobe. No acute consolidative airspace disease. Previously noted tiny subpleural known nodule in the anterior aspect of the left lower lobe is no longer noted, presumably a reactive subpleural lymph node on the prior examination. Partially calcified small right-sided pleural plaque is unchanged (image 63 of series 5). No other calcified pleural plaques are identified. Tiny calcified granulomas are again noted in the periphery of the right lower lobe, now with an atelectatic lung.  Musculoskeletal/Soft Tissues: There are no aggressive appearing lytic or blastic lesions noted in the visualized portions of the skeleton.  CTA ABDOMEN AND PELVIS FINDINGS  Hepatobiliary: Liver has a shrunken appearance and nodular contour, compatible with underlying cirrhosis. No discrete cystic or solid hepatic lesions. No intra or extrahepatic biliary ductal dilatation. Large rim calcified gallstone in the gallbladder measuring up to 3.8 cm in diameter. No current findings to suggest an acute cholecystitis at this time.  Pancreas: No pancreatic mass or peripancreatic inflammatory changes. No pancreatic ductal dilatation. Pancreatic atrophy. No pancreatic or peripancreatic fluid.  Spleen: Unremarkable.  Adrenals/Urinary Tract: Bilateral kidneys and  bilateral adrenal glands are unremarkable in appearance. No hydroureteronephrosis. In the dependent portion of the urinary bladder there is a 12 mm calculus (image 254 of series 5). Urinary bladder is otherwise unremarkable in appearance.  Stomach/Bowel: Normal appearance of the stomach. No pathologic dilatation of small bowel or colon. Numerous colonic diverticulae are noted, without surrounding inflammatory changes to suggest an acute diverticulitis at this time. The appendix is not confidently identified and may be surgically absent. Regardless, there are no inflammatory changes noted adjacent to the cecum to suggest the presence of an acute appendicitis at this time.  Vascular/Lymphatic: Aortic atherosclerosis with vascular findings and measurements pertinent to potential TAVR procedure, as detailed below. No aneurysm or dissection identified in the abdominal or pelvic vasculature. Celiac axis, superior mesenteric artery and inferior mesenteric artery are all widely patent without hemodynamically significant stenosis. Single renal arteries bilaterally are widely patent. Portal vein is mildly dilated measuring 16 mm in the porta hepatis.  Reproductive: Prostate gland and seminal vesicles are unremarkable in appearance.  Other: Small left inguinal hernia containing a short segment of small bowel. No significant volume of ascites. No pneumoperitoneum.  Musculoskeletal: There are no aggressive appearing lytic or blastic lesions noted in the visualized portions of the skeleton.  VASCULAR MEASUREMENTS PERTINENT TO TAVR:  AORTA:  Minimal Aortic Diameter -  18 x 14 mm  Severity of Aortic Calcification -  moderate  RIGHT PELVIS:  Right Common Iliac Artery -  Minimal Diameter - 11.0 x 9.1 mm  Tortuosity - moderate  Calcification - mild to moderate  Right External Iliac Artery -  Minimal Diameter - 8.7 x 8.9 mm  Tortuosity - moderate to  severe  Calcification - none  Right Common Femoral Artery -  Minimal Diameter - 9.2 x 6.8 mm  Tortuosity - mild  Calcification - mild  LEFT PELVIS:  Left Common Iliac Artery -  Minimal Diameter - 9.5 x 10.0 mm  Tortuosity - severe  Calcification - none  Left External Iliac Artery -  Minimal Diameter - 8.1 x 7.9 mm  Tortuosity - moderate  Calcification - none  Left Common Femoral Artery -  Minimal Diameter - 8.5 x 6.3 mm  Tortuosity - mild  Calcification - mild  Review of the MIP images confirms the above findings.  IMPRESSION: 1. Vascular findings and measurements pertinent to potential TAVR procedure, as detailed above. This patient appears to have suitable pelvic arterial access, however, this access is most optimal on the right side secondary to the lesser degree of tortuosity. Additionally, there is a small left inguinal hernia containing a short segment of small bowel which comes in close proximity to the left common femoral artery. 2. Severe thickening calcification of what appears to be a bicuspid aortic valve, compatible with the reported clinical history of severe aortic stenosis. 3. Interval pericardiocentesis with persistent indwelling pericardiocentesis catheter, with significant decreased size of what is now only a small amount of residual pericardial fluid and/or thickening. 4. Large filling defect in the tip of the left atrial appendage, concerning for potential left atrial appendage thrombus (although this could simply reflect pseudo thrombus secondary to contrast bolus timing). Correlation with TEE is recommended, if not already performed. 5. Aortic atherosclerosis, in addition to 2 vessel coronary artery disease. Please note that although the presence of coronary artery calcium documents the presence of coronary artery disease, the severity of this disease and any potential stenosis cannot be assessed on this  non-gated CT examination. Assessment for potential risk factor modification, dietary therapy or pharmacologic therapy may be warranted, if clinically indicated. 6. Persistent large right-sided pleural effusion with extensive passive atelectasis in the right lower lobe. Trace left pleural effusion with minimal dependent passive atelectasis in the left lower lobe. 7. Cholelithiasis without evidence of acute cholecystitis. 8. Colonic diverticulosis without evidence of acute diverticulitis at this time. 9. Morphologic changes in the liver compatible with underlying cirrhosis. 10. Additional incidental findings, as above.   Electronically Signed   By: Trudie Reed M.D.   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 He has a 2 year history of progressive symptoms that have markedly worsened over the past 2 months resulting in admission for decompensated heart failure requiring inotropic therapy and pericardiocentesis. I have personally reviewed and interpreted his most recent 2D echo, cath and CTA studies. His echo shows a functionally bicuspid aortic valve with marked calcification, thickening and poor leaflet mobility with a mean gradient of 46 mm Hg despite a very low EF. Cardia cath shows moderate non-obstructive coronary disease with low CO and MVO2, and severe pulmonary HTN. I think AVR is the only option for trying to improve his EF and heart failure symptoms. He would be a very high risk candidate for open surgical AVR due to his poor LV function, persistent atrial fibrillation, prior pulmonary embolism and cirrhosis seen on CT. I think TAVR would be a much better option for him. His gated cardiac CT shows a functionally bicuspid aortic vavle with anatomy favorable for a Sapien 3  valve. His abdominal and pelvic CT shows adequate vasculature for a transfemoral access.  There is a left inguinal hernia with a loop of small bowel close to the left common femoral artery so we will need to be careful on the left side. His cardiac CT and CTA of the chest show a possible thrombus in the tip of the LAA. He is going to have a TEE today to evaluate this further.  The patient was counseled at length regarding treatment alternatives for management of severe symptomatic aortic stenosis. Alternative approaches such as conventional aortic valve replacement, transcatheter aortic valve replacement, and palliative medical therapy were compared and contrasted at length.  The risks associated with conventional surgical aortic valve replacement have been discussed in detail, as were expectations for post-operative convalescence. Long-term prognosis with medical therapy was discussed. Expectations regarding his convalescence following uncomplicated transcatheter aortic valve replacement was discussed. We discussed complications that might develop including but not limited to risks of death, stroke, paravalvular leak, aortic dissection or other major vascular complications, aortic annulus rupture, device embolization, cardiac rupture or perforation, mitral regurgitation, acute myocardial infarction, arrhythmia, heart block or bradycardia requiring permanent pacemaker placement, congestive heart failure, respiratory failure, renal failure, pneumonia, infection, other late complications related to structural valve deterioration or migration, or other complications that might ultimately cause a temporary or permanent loss of functional independence or other long term morbidity. I discussed transcatheter aortic valve replacement, what types of management strategies would be attempted intraoperatively in the event of life-threatening complications, including whether or not the patient would be considered a candidate  for the use of cardiopulmonary bypass and/or conversion to open sternotomy for attempted surgical intervention.  I told him that I did not think he would survive an open procedure due to his poor LV function and enlarged ascending aorta which would probably require replacement if we had to do an open procedure. The patient provides full informed consent for the procedure as described and all questions were answered.   Plan:  He will have a TEE today to evaluate for a LAA thrombus given the CT findings and history of chronic persistent atrial fibrillation. Then we will decide about the timing of TAVR surgery.  I spent 80 minutes performing this consultation and > 50% of this time was spent face to face counseling and coordinating the care of this patient's severe aortic stenosis and severe LV dysfunction.  [name], MD 09/04/2016 9:55 AM

## 2016-09-04 NOTE — Progress Notes (Signed)
  Echocardiogram Echocardiogram Transesophageal has been performed.  Leta Jungling M 09/04/2016, 1:54 PM

## 2016-09-04 NOTE — Progress Notes (Signed)
Patient ID: Lucas Munoz, male   DOB: 07/20/1951, 65 y.o.   MRN: [id number] Cardiothoracic Surgery  I have reviewed his TEE and there is no well-formed thrombus in the LAA. There is amorphous shadowing which could be sluggish flow or developing thrombus. He has been on heparin for several weeks so I would not expect developing thrombus. His is quite tenuous on Milrinone 0.375 mcg with very poor LV function and severe AS, stage IV CHF. I think it is best to proceed with TAVR tomorrow. If we wait and send him home on milrinone and anticoagulation he will likely expire before his valve can be replaced and TAVR is his only chance of longer term survival. His risk of stroke is increased and I have discussed that with him. He completely understands and wishes to proceed with TAVR tomorrow.

## 2016-09-04 NOTE — Progress Notes (Addendum)
Addendum: Spoke to Dr. Shirlee Latch who said there was a large amount of amorphous material suggesting forming/developing thrombus. Previously had been conservative with heparin dosing but he is now asking if we can shoot for upper end of range for therapeutic goal. Of note patient still has drain in place so would avoid bolus if possible.   Plan:  Recheck 6 hour heparin level with dose adjustment earlier  Sherron Monday, PharmD Clinical Pharmacy Resident Pager: [phone] 09/04/16 4:14 PM  ANTICOAGULATION CONSULT NOTE - Follow-up Consult  Pharmacy Consult for Heparin Indication: atrial fibrillation   Assessment: 65yom on apixaban for afib, now transitioned  to heparin. Last apixaban dose 10/13 am. Heparin level and APTT correlating so using heparin level only. S/p pericardiocentesis 10/20 with drain in place and plan for TAVR this week. Restarted heparin drip 10/20 at 1450 uts/hr heparin level fell 0.34>0.26>0.25. CT with concern for possible LA appendage thrombus. Plan for TEE today. Will increase rate conservatively since there is a pericardial drain in place and do not want to increase risk of bleeding  -  Today there is no bleeding, pericardial drain w/ clear/yellow fluid.   Goal of Therapy:  Heparin level 0.3-0.7 units/ml Monitor platelets by anticoagulation protocol: Yes   Plan:  1) Increase heparin to 1600 units/hr  2) Daily heparin level, CBC 3) F/u when drain pulled, heparin likely to be held 4) F/u TEE results  Patient Measurements: Height: 5\' 8"  (172.7 cm) Weight: 181 lb 10.5 oz (82.4 kg) IBW/kg (Calculated) : 68.4 Heparin Dosing Weight: 86.8kg  Vital Signs: Temp: 97.7 F (36.5 C) (10/23 0814) Temp Source: Oral (10/23 0814) BP: 98/71 (10/23 0814) Pulse Rate: 112 (10/23 0814)  Labs:  Recent Labs  09/02/16 0329 09/03/16 0528 09/04/16 0427  HGB 12.5* 12.5* 11.8*  HCT 39.3 38.9* 37.4*  PLT 135* 124* 134*  HEPARINUNFRC 0.32 0.26* 0.25*  CREATININE 1.52* 1.59*  1.43*    Estimated Creatinine Clearance: 52.5 mL/min (by C-G formula based on SCr of 1.43 mg/dL (H)).   Sherron Monday, PharmD Clinical Pharmacy Resident Pager: [phone] 09/04/16 11:29 AM

## 2016-09-04 NOTE — CV Procedure (Addendum)
Procedure: TEE  Indication: Atrial fibrillation, ?LA appendage thrombus on CT.  Sedation: Versed 1 mg IV, Fentanyl 25 mcg IV  Findings: Please see echo section for full report.  There was a pleural effusion noted.  Normal LV size with mild LV hypertrophy.  EF 15-20%, diffuse hypokinesis.  Normal RV size with mildly decreased systolic function.  Trivial TR.  Trivial MR.  Normal right atrial size.  Moderate left atrial enlargement.  There was not a discrete formed thrombus in the LA appendage but there was a large amount of amorphous material suggesting forming/developing thrombus.  The aortic valve was probably trileaflet with severe calcification and severe aortic stenosis.  Mean gradient 52 mmHg.  There was trivial aortic insufficiency.  Possible small PFO seen by color doppler.  The ascending aorta was mildly dilated at 4.2 cm.    Will discuss with Drs. McAlhany/Bartle.   Lucas Munoz 09/04/2016 1:42 PM

## 2016-09-05 ENCOUNTER — Encounter (HOSPITAL_COMMUNITY)
Admission: AD | Disposition: A | Discharge status: Home Health | Payer: Self-pay | Source: Ambulatory Visit | Attending: Cardiovascular Disease

## 2016-09-05 ENCOUNTER — Inpatient Hospital Stay (HOSPITAL_COMMUNITY): Payer: Commercial Managed Care - HMO

## 2016-09-05 ENCOUNTER — Inpatient Hospital Stay (HOSPITAL_COMMUNITY): Payer: Commercial Managed Care - HMO | Admitting: Certified Registered"

## 2016-09-05 ENCOUNTER — Encounter (HOSPITAL_COMMUNITY): Payer: Self-pay | Admitting: Certified Registered"

## 2016-09-05 DIAGNOSIS — I350 Nonrheumatic aortic (valve) stenosis: Secondary | ICD-10-CM

## 2016-09-05 DIAGNOSIS — Z006 Encounter for examination for normal comparison and control in clinical research program: Secondary | ICD-10-CM

## 2016-09-05 HISTORY — PX: TRANSCATHETER AORTIC VALVE REPLACEMENT, TRANSFEMORAL: SHX6400

## 2016-09-05 HISTORY — PX: CHEST TUBE INSERTION: SHX231

## 2016-09-05 HISTORY — PX: TEE WITHOUT CARDIOVERSION: SHX5443

## 2016-09-05 LAB — POCT I-STAT 3, ART BLOOD GAS (G3+)
Acid-Base Excess: 3 mmol/L — ABNORMAL HIGH (ref 0.0–2.0)
BICARBONATE: 28.4 mmol/L — AB (ref 20.0–28.0)
O2 Saturation: 100 %
PH ART: 7.42 (ref 7.350–7.450)
TCO2: 30 mmol/L (ref 0–100)
pCO2 arterial: 43.5 mmHg (ref 32.0–48.0)
pO2, Arterial: 179 mmHg — ABNORMAL HIGH (ref 83.0–108.0)

## 2016-09-05 LAB — CBC
HCT: 36.7 % — ABNORMAL LOW (ref 39.0–52.0)
HEMATOCRIT: 36.4 % — AB (ref 39.0–52.0)
HEMOGLOBIN: 11.4 g/dL — AB (ref 13.0–17.0)
HEMOGLOBIN: 11.6 g/dL — AB (ref 13.0–17.0)
MCH: 25.6 pg — ABNORMAL LOW (ref 26.0–34.0)
MCH: 26 pg (ref 26.0–34.0)
MCHC: 31.3 g/dL (ref 30.0–36.0)
MCHC: 31.6 g/dL (ref 30.0–36.0)
MCV: 81.8 fL (ref 78.0–100.0)
MCV: 82.1 fL (ref 78.0–100.0)
Platelets: 165 10*3/uL (ref 150–400)
Platelets: 134 10*3/uL — ABNORMAL LOW (ref 150–400)
RBC: 4.45 MIL/uL (ref 4.22–5.81)
RBC: 4.47 MIL/uL (ref 4.22–5.81)
RDW: 18.2 % — AB (ref 11.5–15.5)
RDW: 17.9 % — ABNORMAL HIGH (ref 11.5–15.5)
WBC: 6.2 10*3/uL (ref 4.0–10.5)
WBC: 8.2 10*3/uL (ref 4.0–10.5)

## 2016-09-05 LAB — POCT I-STAT 4, (NA,K, GLUC, HGB,HCT)
Glucose, Bld: 103 mg/dL — ABNORMAL HIGH (ref 65–99)
HCT: 38 % — ABNORMAL LOW (ref 39.0–52.0)
HEMOGLOBIN: 12.9 g/dL — AB (ref 13.0–17.0)
POTASSIUM: 3.2 mmol/L — AB (ref 3.5–5.1)
Sodium: 139 mmol/L (ref 135–145)

## 2016-09-05 LAB — COOXEMETRY PANEL
CARBOXYHEMOGLOBIN: 1.4 % (ref 0.5–1.5)
METHEMOGLOBIN: 0.8 % (ref 0.0–1.5)
O2 Saturation: 54.8 %
Total hemoglobin: 11.7 g/dL — ABNORMAL LOW (ref 12.0–16.0)

## 2016-09-05 LAB — GLUCOSE, CAPILLARY
GLUCOSE-CAPILLARY: 117 mg/dL — AB (ref 65–99)
Glucose-Capillary: 110 mg/dL — ABNORMAL HIGH (ref 65–99)
Glucose-Capillary: 110 mg/dL — ABNORMAL HIGH (ref 65–99)
Glucose-Capillary: 103 mg/dL — ABNORMAL HIGH (ref 65–99)
Glucose-Capillary: 95 mg/dL (ref 65–99)

## 2016-09-05 LAB — PROTIME-INR
INR: 1.21
PROTHROMBIN TIME: 15.4 s — AB (ref 11.4–15.2)

## 2016-09-05 LAB — BASIC METABOLIC PANEL
ANION GAP: 7 (ref 5–15)
BUN: 16 mg/dL (ref 6–20)
CALCIUM: 8.3 mg/dL — AB (ref 8.9–10.3)
CO2: 29 mmol/L (ref 22–32)
CREATININE: 1.4 mg/dL — AB (ref 0.61–1.24)
Chloride: 97 mmol/L — ABNORMAL LOW (ref 101–111)
GFR, EST AFRICAN AMERICAN: 59 mL/min — AB (ref >60)
GFR, EST NON AFRICAN AMERICAN: 50 mL/min — AB (ref >60)
Glucose, Bld: 138 mg/dL — ABNORMAL HIGH (ref 65–99)
Potassium: 4 mmol/L (ref 3.5–5.1)
SODIUM: 133 mmol/L — AB (ref 135–145)

## 2016-09-05 LAB — HEPARIN LEVEL (UNFRACTIONATED): HEPARIN UNFRACTIONATED: 0.55 [IU]/mL (ref 0.30–0.70)

## 2016-09-05 LAB — PREPARE RBC (CROSSMATCH)

## 2016-09-05 LAB — PH, BODY FLUID: PH, BODY FLUID: 7.9

## 2016-09-05 LAB — ABO/RH: ABO/RH(D): B NEG

## 2016-09-05 LAB — APTT: aPTT: 30 seconds (ref 24–36)

## 2016-09-05 SURGERY — IMPLANTATION, AORTIC VALVE, TRANSCATHETER, FEMORAL APPROACH
Anesthesia: General | Laterality: Right

## 2016-09-05 MED ORDER — PROTAMINE SULFATE 10 MG/ML IV SOLN
INTRAVENOUS | Status: AC
  Filled 2016-09-05: qty 25

## 2016-09-05 MED ORDER — MIDAZOLAM HCL 5 MG/5ML IJ SOLN
INTRAMUSCULAR | Status: DC | PRN
  Administered 2016-09-05 (×2): 1 mg via INTRAVENOUS

## 2016-09-05 MED ORDER — VANCOMYCIN HCL 10 G IV SOLR
1250.0000 mg | INTRAVENOUS | Status: AC
  Administered 2016-09-05: 1250 mg via INTRAVENOUS
  Filled 2016-09-05: qty 1250

## 2016-09-05 MED ORDER — DEXMEDETOMIDINE HCL IN NACL 200 MCG/50ML IV SOLN
0.1000 ug/kg/h | INTRAVENOUS | Status: DC
  Filled 2016-09-05: qty 50

## 2016-09-05 MED ORDER — OXYCODONE HCL 5 MG PO TABS
5.0000 mg | ORAL_TABLET | ORAL | Status: DC | PRN
  Administered 2016-09-05 – 2016-09-06 (×4): 10 mg via ORAL
  Administered 2016-09-07: 5 mg via ORAL
  Administered 2016-09-07 – 2016-09-12 (×10): 10 mg via ORAL
  Filled 2016-09-05 (×15): qty 2

## 2016-09-05 MED ORDER — CEFUROXIME SODIUM 1.5 G IJ SOLR
1.5000 g | INTRAMUSCULAR | Status: AC
  Administered 2016-09-05: 1.5 g via INTRAVENOUS

## 2016-09-05 MED ORDER — LACTATED RINGERS IV SOLN
INTRAVENOUS | Status: DC | PRN
  Administered 2016-09-05: 11:00:00 via INTRAVENOUS

## 2016-09-05 MED ORDER — FENTANYL CITRATE (PF) 250 MCG/5ML IJ SOLN
INTRAMUSCULAR | Status: AC
  Filled 2016-09-05: qty 5

## 2016-09-05 MED ORDER — DEXTROSE 5 % IV SOLN
30.0000 ug/min | INTRAVENOUS | Status: DC
  Filled 2016-09-05: qty 2

## 2016-09-05 MED ORDER — LACTATED RINGERS IV SOLN
INTRAVENOUS | Status: DC | PRN
  Administered 2016-09-05: 10:00:00 via INTRAVENOUS

## 2016-09-05 MED ORDER — 0.9 % SODIUM CHLORIDE (POUR BTL) OPTIME
TOPICAL | Status: DC | PRN
  Administered 2016-09-05: 4000 mL

## 2016-09-05 MED ORDER — FENTANYL CITRATE (PF) 250 MCG/5ML IJ SOLN
INTRAMUSCULAR | Status: DC | PRN
  Administered 2016-09-05: 150 ug via INTRAVENOUS
  Administered 2016-09-05: 100 ug via INTRAVENOUS

## 2016-09-05 MED ORDER — ORAL CARE MOUTH RINSE
15.0000 mL | Freq: Two times a day (BID) | OROMUCOSAL | Status: DC
  Administered 2016-09-06 – 2016-09-15 (×11): 15 mL via OROMUCOSAL

## 2016-09-05 MED ORDER — EPINEPHRINE PF 1 MG/ML IJ SOLN
0.0000 ug/min | INTRAMUSCULAR | Status: DC
  Filled 2016-09-05: qty 4

## 2016-09-05 MED ORDER — DEXTROSE 5 % IV SOLN
0.0000 ug/min | INTRAVENOUS | Status: DC
  Filled 2016-09-05: qty 2

## 2016-09-05 MED ORDER — IODIXANOL 320 MG/ML IV SOLN
INTRAVENOUS | Status: DC | PRN
  Administered 2016-09-05: 38.8 mL via INTRAVENOUS

## 2016-09-05 MED ORDER — CHLORHEXIDINE GLUCONATE 0.12 % MT SOLN
15.0000 mL | OROMUCOSAL | Status: AC
  Administered 2016-09-05: 15 mL via OROMUCOSAL

## 2016-09-05 MED ORDER — NITROGLYCERIN IN D5W 200-5 MCG/ML-% IV SOLN: 0.0000 ug/min | INTRAVENOUS | Status: DC

## 2016-09-05 MED ORDER — ALBUMIN HUMAN 5 % IV SOLN
250.0000 mL | INTRAVENOUS | Status: AC | PRN
  Administered 2016-09-06: 250 mL via INTRAVENOUS

## 2016-09-05 MED ORDER — ACETAMINOPHEN 160 MG/5ML PO SOLN: 650.0000 mg | Freq: Once | ORAL | Status: DC

## 2016-09-05 MED ORDER — ACETAMINOPHEN 160 MG/5ML PO SOLN: 1000.0000 mg | Freq: Four times a day (QID) | ORAL | Status: AC

## 2016-09-05 MED ORDER — MIDAZOLAM HCL 2 MG/2ML IJ SOLN: 2.0000 mg | INTRAMUSCULAR | Status: DC | PRN

## 2016-09-05 MED ORDER — DEXTROSE 5 % IV SOLN
1.5000 g | Freq: Two times a day (BID) | INTRAVENOUS | Status: AC
  Administered 2016-09-05 – 2016-09-07 (×4): 1.5 g via INTRAVENOUS
  Filled 2016-09-05 (×4): qty 1.5

## 2016-09-05 MED ORDER — INSULIN REGULAR BOLUS VIA INFUSION
0.0000 [IU] | Freq: Three times a day (TID) | INTRAVENOUS | Status: DC
Start: 2016-09-05 — End: 2016-09-06
  Filled 2016-09-05: qty 10

## 2016-09-05 MED ORDER — POTASSIUM CHLORIDE 2 MEQ/ML IV SOLN
80.0000 meq | INTRAVENOUS | Status: DC
  Filled 2016-09-05: qty 40

## 2016-09-05 MED ORDER — SODIUM CHLORIDE 0.9 % IV SOLN
INTRAVENOUS | Status: DC
  Filled 2016-09-05: qty 2.5

## 2016-09-05 MED ORDER — ACETAMINOPHEN 500 MG PO TABS
1000.0000 mg | ORAL_TABLET | Freq: Four times a day (QID) | ORAL | Status: AC
  Administered 2016-09-05 – 2016-09-10 (×14): 1000 mg via ORAL
  Filled 2016-09-05 (×15): qty 2

## 2016-09-05 MED ORDER — SODIUM CHLORIDE 0.9 % IV SOLN
INTRAVENOUS | Status: DC
  Filled 2016-09-05: qty 30

## 2016-09-05 MED ORDER — DOPAMINE-DEXTROSE 3.2-5 MG/ML-% IV SOLN: 0.0000 ug/kg/min | INTRAVENOUS | Status: DC

## 2016-09-05 MED ORDER — LIDOCAINE 2% (20 MG/ML) 5 ML SYRINGE
INTRAMUSCULAR | Status: AC
  Filled 2016-09-05: qty 5

## 2016-09-05 MED ORDER — NITROGLYCERIN IN D5W 200-5 MCG/ML-% IV SOLN
2.0000 ug/min | INTRAVENOUS | Status: DC
  Filled 2016-09-05: qty 250

## 2016-09-05 MED ORDER — AMIODARONE HCL IN DEXTROSE 360-4.14 MG/200ML-% IV SOLN
30.0000 mg/h | INTRAVENOUS | Status: DC
  Administered 2016-09-06 – 2016-09-11 (×9): 30 mg/h via INTRAVENOUS
  Filled 2016-09-05 (×11): qty 200

## 2016-09-05 MED ORDER — DEXMEDETOMIDINE HCL IN NACL 400 MCG/100ML IV SOLN
0.1000 ug/kg/h | INTRAVENOUS | Status: AC
  Administered 2016-09-05: .5 ug/kg/h via INTRAVENOUS
  Filled 2016-09-05: qty 100

## 2016-09-05 MED ORDER — ONDANSETRON HCL 4 MG/2ML IJ SOLN
4.0000 mg | Freq: Four times a day (QID) | INTRAMUSCULAR | Status: DC | PRN
Start: 2016-09-05 — End: 2016-09-16
  Administered 2016-09-06 – 2016-09-15 (×4): 4 mg via INTRAVENOUS
  Filled 2016-09-05: qty 2

## 2016-09-05 MED ORDER — ROCURONIUM BROMIDE 10 MG/ML (PF) SYRINGE
PREFILLED_SYRINGE | INTRAVENOUS | Status: AC
  Filled 2016-09-05: qty 10

## 2016-09-05 MED ORDER — ROCURONIUM BROMIDE 10 MG/ML (PF) SYRINGE
PREFILLED_SYRINGE | INTRAVENOUS | Status: DC | PRN
  Administered 2016-09-05: 60 mg via INTRAVENOUS
  Administered 2016-09-05: 40 mg via INTRAVENOUS
  Administered 2016-09-05: 20 mg via INTRAVENOUS

## 2016-09-05 MED ORDER — ETOMIDATE 2 MG/ML IV SOLN
INTRAVENOUS | Status: DC | PRN
  Administered 2016-09-05: 20 mg via INTRAVENOUS

## 2016-09-05 MED ORDER — POTASSIUM CHLORIDE 10 MEQ/50ML IV SOLN
10.0000 meq | INTRAVENOUS | Status: AC
  Administered 2016-09-05 (×3): 10 meq via INTRAVENOUS

## 2016-09-05 MED ORDER — MORPHINE SULFATE (PF) 2 MG/ML IV SOLN: 1.0000 mg | INTRAVENOUS | Status: AC | PRN

## 2016-09-05 MED ORDER — ETOMIDATE 2 MG/ML IV SOLN
INTRAVENOUS | Status: AC
  Filled 2016-09-05: qty 10

## 2016-09-05 MED ORDER — LIDOCAINE 2% (20 MG/ML) 5 ML SYRINGE
INTRAMUSCULAR | Status: DC | PRN
  Administered 2016-09-05: 100 mg via INTRAVENOUS

## 2016-09-05 MED ORDER — TRAMADOL HCL 50 MG PO TABS
50.0000 mg | ORAL_TABLET | ORAL | Status: DC | PRN
  Administered 2016-09-08 – 2016-09-09 (×2): 100 mg via ORAL
  Filled 2016-09-05: qty 1
  Filled 2016-09-05: qty 2
  Filled 2016-09-05: qty 1

## 2016-09-05 MED ORDER — LACTATED RINGERS IV SOLN: 500.0000 mL | Freq: Once | INTRAVENOUS | Status: DC | PRN

## 2016-09-05 MED ORDER — VANCOMYCIN HCL IN DEXTROSE 1-5 GM/200ML-% IV SOLN
1000.0000 mg | Freq: Once | INTRAVENOUS | Status: AC
  Administered 2016-09-05: 1000 mg via INTRAVENOUS
  Filled 2016-09-05: qty 200

## 2016-09-05 MED ORDER — MORPHINE SULFATE (PF) 2 MG/ML IV SOLN
2.0000 mg | INTRAVENOUS | Status: DC | PRN
  Administered 2016-09-07: 2 mg via INTRAVENOUS
  Filled 2016-09-05: qty 1
  Filled 2016-09-05: qty 2

## 2016-09-05 MED ORDER — NOREPINEPHRINE BITARTRATE 1 MG/ML IV SOLN
0.0000 ug/min | INTRAVENOUS | Status: AC
  Administered 2016-09-05: 2 ug/min via INTRAVENOUS
  Filled 2016-09-05: qty 4

## 2016-09-05 MED ORDER — MAGNESIUM SULFATE 50 % IJ SOLN
40.0000 meq | INTRAMUSCULAR | Status: DC
  Filled 2016-09-05: qty 10

## 2016-09-05 MED ORDER — PROTAMINE SULFATE 10 MG/ML IV SOLN
INTRAVENOUS | Status: AC
  Filled 2016-09-05: qty 10

## 2016-09-05 MED ORDER — SODIUM CHLORIDE 0.9 % IV SOLN
INTRAVENOUS | Status: DC | PRN
  Administered 2016-09-05 (×3): 500 mL

## 2016-09-05 MED ORDER — HEPARIN SODIUM (PORCINE) 1000 UNIT/ML IJ SOLN
INTRAMUSCULAR | Status: DC | PRN
  Administered 2016-09-05: 24000 [IU] via INTRAVENOUS

## 2016-09-05 MED ORDER — PANTOPRAZOLE SODIUM 40 MG PO TBEC
40.0000 mg | DELAYED_RELEASE_TABLET | Freq: Every day | ORAL | Status: DC
  Administered 2016-09-07 – 2016-09-12 (×6): 40 mg via ORAL
  Filled 2016-09-05 (×6): qty 1

## 2016-09-05 MED ORDER — MIDAZOLAM HCL 2 MG/2ML IJ SOLN
INTRAMUSCULAR | Status: AC
  Filled 2016-09-05: qty 2

## 2016-09-05 MED ORDER — SODIUM CHLORIDE 0.9 % IV SOLN
INTRAVENOUS | Status: AC
  Administered 2016-09-05: 1.1 [IU]/h via INTRAVENOUS
  Filled 2016-09-05: qty 2.5

## 2016-09-05 MED ORDER — INSULIN ASPART 100 UNIT/ML ~~LOC~~ SOLN: 0.0000 [IU] | SUBCUTANEOUS | Status: DC

## 2016-09-05 MED ORDER — FAMOTIDINE IN NACL 20-0.9 MG/50ML-% IV SOLN
20.0000 mg | Freq: Two times a day (BID) | INTRAVENOUS | Status: AC
  Administered 2016-09-06: 20 mg via INTRAVENOUS
  Filled 2016-09-05: qty 50

## 2016-09-05 MED ORDER — PROTAMINE SULFATE 10 MG/ML IV SOLN
INTRAVENOUS | Status: DC | PRN
  Administered 2016-09-05: 50 mg via INTRAVENOUS
  Administered 2016-09-05: 30 mg via INTRAVENOUS
  Administered 2016-09-05: 20 mg via INTRAVENOUS
  Administered 2016-09-05 (×3): 30 mg via INTRAVENOUS
  Administered 2016-09-05: 50 mg via INTRAVENOUS

## 2016-09-05 MED ORDER — HEPARIN (PORCINE) IN NACL 100-0.45 UNIT/ML-% IJ SOLN
1600.0000 [IU]/h | INTRAMUSCULAR | Status: DC
  Administered 2016-09-05: 1900 [IU]/h via INTRAVENOUS
  Filled 2016-09-05 (×3): qty 250

## 2016-09-05 MED ORDER — ACETAMINOPHEN 650 MG RE SUPP: 650.0000 mg | Freq: Once | RECTAL | Status: DC

## 2016-09-05 MED ORDER — MAGNESIUM SULFATE 4 GM/100ML IV SOLN: 4.0000 g | Freq: Once | INTRAVENOUS | Status: DC

## 2016-09-05 MED FILL — Potassium Chloride Inj 2 mEq/ML: INTRAVENOUS | Qty: 40 | Status: AC

## 2016-09-05 MED FILL — Electrolyte-R (PH 7.4) Solution: INTRAVENOUS | Qty: 3000 | Status: AC

## 2016-09-05 MED FILL — Heparin Sodium (Porcine) Inj 1000 Unit/ML: INTRAMUSCULAR | Qty: 30 | Status: AC

## 2016-09-05 MED FILL — Magnesium Sulfate Inj 50%: INTRAMUSCULAR | Qty: 10 | Status: AC

## 2016-09-05 SURGICAL SUPPLY — 97 items
ADAPTER UNIV SWAN GANZ BIP (ADAPTER) ×2 IMPLANT
ADAPTER UNV SWAN GANZ BIP (ADAPTER) ×1
BAG BANDED W/RUBBER/TAPE 36X54 (MISCELLANEOUS) ×3 IMPLANT
BAG DECANTER FOR FLEXI CONT (MISCELLANEOUS) IMPLANT
BAG SNAP BAND KOVER 36X36 (MISCELLANEOUS) ×6 IMPLANT
BLADE OSCILLATING /SAGITTAL (BLADE) IMPLANT
BLADE STERNUM SYSTEM 6 (BLADE) ×3 IMPLANT
BLADE SURG ROTATE 9660 (MISCELLANEOUS) IMPLANT
CABLE PACING FASLOC BIEGE (MISCELLANEOUS) ×3 IMPLANT
CABLE PACING FASLOC BLUE (MISCELLANEOUS) ×3 IMPLANT
CANNULA FEM VENOUS REMOTE 22FR (CANNULA) IMPLANT
CANNULA OPTISITE PERFUSION 16F (CANNULA) IMPLANT
CANNULA OPTISITE PERFUSION 18F (CANNULA) IMPLANT
CATH DIAG EXPO 6F VENT PIG 145 (CATHETERS) ×6 IMPLANT
CATH EXPO 5FR AL1 (CATHETERS) ×3 IMPLANT
CATH S G BIP PACING (SET/KITS/TRAYS/PACK) ×6 IMPLANT
CLIP TI MEDIUM 24 (CLIP) ×3 IMPLANT
CLIP TI WIDE RED SMALL 24 (CLIP) ×3 IMPLANT
CONT SPEC 4OZ CLIKSEAL STRL BL (MISCELLANEOUS) ×18 IMPLANT
COVER BACK TABLE 24X17X13 BIG (DRAPES) ×3 IMPLANT
COVER DOME SNAP 22 D (MISCELLANEOUS) ×3 IMPLANT
COVER MAYO STAND STRL (DRAPES) ×3 IMPLANT
COVER TABLE BACK 60X90 (DRAPES) ×3 IMPLANT
CRADLE DONUT ADULT HEAD (MISCELLANEOUS) ×3 IMPLANT
DERMABOND ADVANCED (GAUZE/BANDAGES/DRESSINGS) ×1
DERMABOND ADVANCED .7 DNX12 (GAUZE/BANDAGES/DRESSINGS) ×2 IMPLANT
DEVICE CLOSURE PERCLS PRGLD 6F (VASCULAR PRODUCTS) ×4 IMPLANT
DRAPE INCISE IOBAN 66X45 STRL (DRAPES) IMPLANT
DRAPE SLUSH MACHINE 52X66 (DRAPES) ×3 IMPLANT
DRAPE TABLE COVER HEAVY DUTY (DRAPES) ×3 IMPLANT
DRSG OPSITE 6X11 MED (GAUZE/BANDAGES/DRESSINGS) ×3 IMPLANT
DRSG TEGADERM 4X4.75 (GAUZE/BANDAGES/DRESSINGS) ×3 IMPLANT
ELECT REM PT RETURN 9FT ADLT (ELECTROSURGICAL) ×6
ELECTRODE REM PT RTRN 9FT ADLT (ELECTROSURGICAL) ×4 IMPLANT
FELT TEFLON 6X6 (MISCELLANEOUS) ×3 IMPLANT
FEMORAL VENOUS CANN RAP (CANNULA) IMPLANT
GAUZE SPONGE 4X4 12PLY STRL (GAUZE/BANDAGES/DRESSINGS) ×3 IMPLANT
GLOVE BIO SURGEON STRL SZ8 (GLOVE) ×6 IMPLANT
GLOVE EUDERMIC 7 POWDERFREE (GLOVE) ×6 IMPLANT
GLOVE ORTHO TXT STRL SZ7.5 (GLOVE) ×6 IMPLANT
GOWN STRL REUS W/ TWL LRG LVL3 (GOWN DISPOSABLE) ×6 IMPLANT
GOWN STRL REUS W/ TWL XL LVL3 (GOWN DISPOSABLE) ×12 IMPLANT
GOWN STRL REUS W/TWL LRG LVL3 (GOWN DISPOSABLE) ×3
GOWN STRL REUS W/TWL XL LVL3 (GOWN DISPOSABLE) ×6
GUIDEWIRE SAF TJ AMPL .035X180 (WIRE) ×3 IMPLANT
GUIDEWIRE SAFE TJ AMPLATZ EXST (WIRE) ×3 IMPLANT
GUIDEWIRE STRAIGHT .035 260CM (WIRE) ×3 IMPLANT
INSERT FOGARTY 61MM (MISCELLANEOUS) ×3 IMPLANT
INSERT FOGARTY SM (MISCELLANEOUS) ×6 IMPLANT
INSERT FOGARTY XLG (MISCELLANEOUS) IMPLANT
KIT BASIN OR (CUSTOM PROCEDURE TRAY) ×3 IMPLANT
KIT DILATOR VASC 18G NDL (KITS) IMPLANT
KIT HEART LEFT (KITS) ×3 IMPLANT
KIT ROOM TURNOVER OR (KITS) ×3 IMPLANT
KIT SUCTION CATH 14FR (SUCTIONS) ×6 IMPLANT
NEEDLE PERC 18GX7CM (NEEDLE) ×3 IMPLANT
NS IRRIG 1000ML POUR BTL (IV SOLUTION) ×9 IMPLANT
PACK AORTA (CUSTOM PROCEDURE TRAY) ×3 IMPLANT
PAD ARMBOARD 7.5X6 YLW CONV (MISCELLANEOUS) ×6 IMPLANT
PAD ELECT DEFIB RADIOL ZOLL (MISCELLANEOUS) ×3 IMPLANT
PERCLOSE PROGLIDE 6F (VASCULAR PRODUCTS) ×6
SET MICROPUNCTURE 5F STIFF (MISCELLANEOUS) ×3 IMPLANT
SHEATH AVANTI 11CM 8FR (MISCELLANEOUS) ×3 IMPLANT
SHEATH PINNACLE 6F 10CM (SHEATH) ×6 IMPLANT
SLEEVE REPOSITIONING LENGTH 30 (MISCELLANEOUS) ×3 IMPLANT
SPONGE GAUZE 4X4 12PLY STER LF (GAUZE/BANDAGES/DRESSINGS) ×6 IMPLANT
SPONGE LAP 4X18 X RAY DECT (DISPOSABLE) ×3 IMPLANT
STOPCOCK MORSE 400PSI 3WAY (MISCELLANEOUS) ×21 IMPLANT
SUT ETHIBOND X763 2 0 SH 1 (SUTURE) ×3 IMPLANT
SUT GORETEX CV 4 TH 22 36 (SUTURE) ×3 IMPLANT
SUT GORETEX CV4 TH-18 (SUTURE) ×9 IMPLANT
SUT GORETEX TH-18 36 INCH (SUTURE) ×6 IMPLANT
SUT PROLENE 3 0 SH1 36 (SUTURE) IMPLANT
SUT PROLENE 4 0 RB 1 (SUTURE) ×1
SUT PROLENE 4-0 RB1 .5 CRCL 36 (SUTURE) ×2 IMPLANT
SUT PROLENE 5 0 C 1 36 (SUTURE) ×6 IMPLANT
SUT PROLENE 6 0 C 1 30 (SUTURE) ×6 IMPLANT
SUT SILK  1 MH (SUTURE) ×1
SUT SILK 1 MH (SUTURE) ×2 IMPLANT
SUT SILK 2 0 SH CR/8 (SUTURE) IMPLANT
SUT VIC AB 2-0 CT1 27 (SUTURE) ×1
SUT VIC AB 2-0 CT1 TAPERPNT 27 (SUTURE) ×2 IMPLANT
SUT VIC AB 2-0 CTX 36 (SUTURE) IMPLANT
SUT VIC AB 3-0 SH 8-18 (SUTURE) ×6 IMPLANT
SYR 30ML LL (SYRINGE) ×6 IMPLANT
SYR 50ML LL SCALE MARK (SYRINGE) ×3 IMPLANT
SYRINGE 10CC LL (SYRINGE) ×9 IMPLANT
SYRINGE 60CC LL (MISCELLANEOUS) ×3 IMPLANT
SYSTEM SAHARA CHEST DRAIN RE-I (WOUND CARE) ×3 IMPLANT
TAPE CLOTH SURG 4X10 WHT LF (GAUZE/BANDAGES/DRESSINGS) ×3 IMPLANT
TOWEL OR 17X26 10 PK STRL BLUE (TOWEL DISPOSABLE) ×6 IMPLANT
TRANSDUCER W/STOPCOCK (MISCELLANEOUS) ×6 IMPLANT
TRAY FOLEY IC TEMP SENS 16FR (CATHETERS) ×3 IMPLANT
TUBING HIGH PRESSURE 120CM (CONNECTOR) ×3 IMPLANT
VALVE HEART TRANSCATH SZ3 26MM (Prosthesis & Implant Heart) ×3 IMPLANT
WIRE AMPLATZ SS-J .035X180CM (WIRE) ×3 IMPLANT
WIRE BENTSON .035X145CM (WIRE) ×3 IMPLANT

## 2016-09-05 NOTE — Progress Notes (Signed)
ANTICOAGULATION CONSULT NOTE - Follow Up Consult  Pharmacy Consult for Heparin Indication: atrial fibrillation and atrial thrombus  Allergies  Allergen Reactions  . Potassium-Containing Compounds Other (See Comments)    Upset stomach    Patient Measurements: Height: 5\' 8"  (172.7 cm) Weight: 179 lb 1.6 oz (81.2 kg) IBW/kg (Calculated) : 68.4  Vital Signs: Temp: 97.8 F (36.6 C) (10/24 0741) Temp Source: Oral (10/24 0741) BP: 101/69 (10/24 0800) Pulse Rate: 68 (10/24 1215)  Labs:  Recent Labs  09/03/16 0528 09/04/16 0427 09/04/16 1800 09/05/16 0315  HGB 12.5* 11.8*  --  11.4*  HCT 38.9* 37.4*  --  36.4*  PLT 124* 134*  --  165  HEPARINUNFRC 0.26* 0.25* 0.16* 0.55  CREATININE 1.59* 1.43*  --  1.40*    Estimated Creatinine Clearance: 49.5 mL/min (by C-G formula based on SCr of 1.4 mg/dL (H)).  Assessment: 65yom on eliquis pta for afib, transitioned to heparin for pericardiocentesis on 10/20 with drain placement. Drain removed today and he is now s/p TAVR. Heparin to resume at 7pm tonight for his afib and developing atrial thrombus that was seen on TEE yesterday. Will resume at previously therapeutic rate of 1900 units/hr.  Goal of Therapy:  Heparin level 0.3-0.7 units/ml Monitor platelets by anticoagulation protocol: Yes   Plan:  1) At 7pm tonight, resume heparin at 1900 units/hr 2) Check 6 hour heparin level 3) Daily heparin level and CBC 4) Follow up starting coumadin  Fredrik Rigger 09/05/2016,1:51 PM

## 2016-09-05 NOTE — Progress Notes (Signed)
ANTICOAGULATION CONSULT NOTE - FOLLOW UP  Heparin dosing weight = 87 kg   Assessment: 65 YOM on Eliquis PTA for history of Afib.  He was transitioned to IV heparin for pericardiocentesis on 09/01/16 with a drain placement, and patient to undergo TAVR tomorrow AM.    Heparin level therapeutic (0.55) on gtt at 1900 units/hr. TEE showed large amount of amorphous material suggesting forming/developing thrombus; therefore, Pharmacy asked to aim for the upper end of normal.  Heparin level therapeutic (0.55) on gtt at 1900 units/hr. Noted heparin to be turned off on call to OR. No bleeding noted.  Plan: - Continue heparin gtt to 1900 units/hr - Heparin off on call to OR  Christoper Fabian, PharmD, BCPS Clinical pharmacist, pager [phone] 09/05/2016, 3:56 AM

## 2016-09-05 NOTE — Procedures (Signed)
Extubation Procedure Note  Patient Details:   Name: Lochlann Raw DOB: April 29, 1951 MRN: [id number]   Airway Documentation:     Evaluation  O2 sats: stable throughout Complications: No apparent complications Patient did tolerate procedure well. Bilateral Breath Sounds: Clear   Yes   Patient extubated to 2L nasal cannula.  Positive cuff leak noted.  Patient able to speak post extubation.  No evidence of stridor.  Incentive spirometry performed x5 with achieved goal of 1000.  Sats currently 98%.  Vitals are stable.  No apparent complications.  Durwin Glaze 09/05/2016, 5:28 PM

## 2016-09-05 NOTE — Anesthesia Postprocedure Evaluation (Signed)
Anesthesia Post Note  Patient: Brooker Dahmen  Procedure(s) Performed: Procedure(s) (LRB): TRANSCATHETER AORTIC VALVE REPLACEMENT, TRANSFEMORAL (N/A) TRANSESOPHAGEAL ECHOCARDIOGRAM (TEE) (N/A) CHEST TUBE INSERTION (Right)  Patient location during evaluation: SICU Anesthesia Type: General Level of consciousness: sedated Pain management: pain level controlled Vital Signs Assessment: post-procedure vital signs reviewed and stable Respiratory status: patient remains intubated per anesthesia plan Cardiovascular status: stable Anesthetic complications: no    Last Vitals:  Vitals:   09/05/16 1345 09/05/16 1400  BP:  (!) 81/58  Pulse: (!) 56 66  Resp: 12 12  Temp: 36.2 C 36.1 C    Last Pain:  Vitals:   09/05/16 0741  TempSrc: Oral  PainSc:                  Teneil Shiller DANIEL

## 2016-09-05 NOTE — Progress Notes (Signed)
CT surgery p.m. Rounds  Resting comfortably No groin hematoma Right chest tube placed with 1.5 L of drainage

## 2016-09-05 NOTE — Anesthesia Preprocedure Evaluation (Addendum)
Anesthesia Evaluation  Patient identified by MRN, date of birth, ID band Patient awake    Reviewed: Allergy & Precautions, NPO status , Patient's Chart, lab work & pertinent test results  History of Anesthesia Complications Negative for: history of anesthetic complications  Airway Mallampati: I  TM Distance: >3 FB     Dental  (+) Edentulous Upper, Edentulous Lower, Dental Advisory Given   Pulmonary former smoker,    Pulmonary exam normal        Cardiovascular hypertension,  Rhythm:Irregular Rate:Normal + Systolic murmurs Echo (10/14): EF 15%, severe AS, moderately decreased RV systolic function, PASP 65 mmHg, moderate to large pericardial effusion without tamponade.    Neuro/Psych negative neurological ROS  negative psych ROS   GI/Hepatic GERD  ,(+) Cirrhosis       ,   Endo/Other  negative endocrine ROS  Renal/GU      Musculoskeletal   Abdominal   Peds  Hematology   Anesthesia Other Findings   Reproductive/Obstetrics                           Anesthesia Physical Anesthesia Plan  ASA: IV  Anesthesia Plan: General   Post-op Pain Management:    Induction: Intravenous  Airway Management Planned: Oral ETT  Additional Equipment: PA Cath and Arterial line  Intra-op Plan:   Post-operative Plan: Post-operative intubation/ventilation  Informed Consent: I have reviewed the patients History and Physical, chart, labs and discussed the procedure including the risks, benefits and alternatives for the proposed anesthesia with the patient or authorized representative who has indicated his/her understanding and acceptance.   Dental advisory given  Plan Discussed with: CRNA, Anesthesiologist and Surgeon  Anesthesia Plan Comments:        Anesthesia Quick Evaluation

## 2016-09-05 NOTE — Anesthesia Procedure Notes (Signed)
Procedure Name: Intubation Date/Time: 09/05/2016 11:15 AM Performed by: Jefm Miles E Pre-anesthesia Checklist: Patient identified, Emergency Drugs available, Suction available and Patient being monitored Patient Re-evaluated:Patient Re-evaluated prior to inductionOxygen Delivery Method: Circle System Utilized Preoxygenation: Pre-oxygenation with 100% oxygen Intubation Type: IV induction Ventilation: Mask ventilation without difficulty Laryngoscope Size: Mac and 3 Grade View: Grade I Tube type: Subglottic suction tube Tube size: 8.0 mm Number of attempts: 1 Airway Equipment and Method: Stylet and Oral airway Placement Confirmation: ETT inserted through vocal cords under direct vision,  positive ETCO2 and breath sounds checked- equal and bilateral Secured at: 21 cm Tube secured with: Tape Dental Injury: Teeth and Oropharynx as per pre-operative assessment

## 2016-09-05 NOTE — Op Note (Addendum)
[facility]   MULTIDISCIPLINARY HEART VALVE TEAM   TAVR OPERATIVE NOTE   Date of Procedure:  09/05/2016  Preoperative Diagnosis: Severe Aortic Stenosis   Postoperative Diagnosis: Same   Procedure:    Transcatheter Aortic Valve Replacement - Percutaneous Right Transfemoral Approach  Edwards Sapien 3 THV (size 26 mm, model # 9600TFX, serial # [id number])   Insertion of right chest tube   Co-Surgeons:  [name], MD and Verne Carrow, MD     Anesthesiologist:  Dr. Krista Blue  Echocardiographer:  Dr. Eden Emms  Pre-operative Echo Findings:   severe aortic stenosis   severe left ventricular systolic dysfunction   Post-operative Echo Findings:  mild paravalvular leak  Severe left ventricular systolic dysfunction    BRIEF CLINICAL NOTE AND INDICATIONS FOR SURGERY  The patient is a 65 year old gentleman with hypertension, cardiomyopathy and chronic systolic and diastolic heart failure, persistent atrial fibrillation on Eliquis, chronic pulmonary embolism, stage 3 CKD and known severe aortic stenosis who had an echo in 04/2016 showing an EF of 20-25% with a mean AV gradient of 40 mm Hg. He reports have a heart murmur diagnosed when he had a physical exam after being drafted for the [place] War which kept him out of the service. There was never any further work up until he saw Dr. Tomie China in [place] about 2 years ago for development of shortness of breath and swelling in his legs. He underwent an extensive evaluation by Dr. Tomie China in [place] and was diagnosed with severe aortic stenosis, congestive heart failure, and atrial fibrillation. He was told that he should have surgery but declined. He says that he lost confidence in his medical care there because he kept having recurring episodes of shortness of breath and edema. He sought a second opinion from Dr. Tresa Endo in May 2017. At that time he was in atrial fibrillation butnot on anticoagulation. Eliquis was  started and the patient was treated for congestive heart failure using diuretics. His echo was as noted above and showed a moderate pericardial effusion and some dilation of the ascending aorta with a question of an aortic dissection. CT angiogram of the chest showed aneurysmal dilatation of the ascending aorta with a maximum transverse diameter of 4.8 cm but noevidence for aortic dissection. He was noted to have a pulmonary embolus in a small segmental branch of the right lower lobe. He was seen again on 08/15/2016 complaining of worsening symptoms of shortness of breath, lower extremity edema, and early satiety. He was not taking his Lasix as previously prescribed and his weight was up about 20 pounds in comparison to his last previous office visit. His medications were adjusted but symptoms did not significantly improve so he was admitted to the hospital 08/24/2016 for intravenous diuretic therapy and further management. Repeat echocardiogram revealed further deterioration in the ejection fraction to 15%. There remained a moderate to large size circumferential pericardial effusion without evidence for pericardial tamponade. The patient was started on intravenous milrinone and aggressively diuresed. Cardiac catheterization on 08/29/2016 showed moderate nonobstructive coronary artery disease with low cardiac output and mixed venous oxygen saturation with elevated filling pressures. Milrinone was increased to .375. The patient responded well to diuretic therapy and his weight has come down 20 pounds since admission. He  underwent pericardiocentesis removing 750 mL of thin serous fluid. He says that he has felt much better since then and is able to eat, sleep, lie down flat and ambulate.  This 65 year old patient has stage D  severe symptomatic aortic stenosis with severe nonischemic cardiomyopathy with an EF of 15% and class IV acute on chronic combined systolic and diastolic congestive heart failure. He has  a 2 year history of progressive symptoms that have markedly worsened over the past 2 months resulting in admission for decompensated heart failure requiring inotropic therapy and pericardiocentesis. I have personally reviewed and interpreted his most recent 2D echo, cath and CTA studies. His echo shows a functionally bicuspid aortic valve with marked calcification, thickening and poor leaflet mobility with a mean gradient of 46 mm Hg despite a very low EF. Cardia cath shows moderate non-obstructive coronary disease with low CO and MVO2, and severe pulmonary HTN. I think AVR is the only option for trying to improve his EF and heart failure symptoms. He would be a very high risk candidate for open surgical AVR due to his poor LV function, persistent atrial fibrillation, prior pulmonary embolism and cirrhosis seen on CT. I think TAVR would be a much better option for him. His gated cardiac CT shows a functionally bicuspid aortic vavle with anatomy favorable for a Sapien 3 valve. His abdominal and pelvic CT shows adequate vasculature for a transfemoral access. There is a left inguinal hernia with a loop of small bowel close to the left common femoral artery so we will need to be careful on the left side. His cardiac CT and CTA of the chest show a possible thrombus in the tip of the LAA. TEE showed a large amount of amorphous material in the LAA that could be developing thrombus or stagnant blood. He has been on heparin for several weeks so I would not expect developing thrombus. His is quite tenuous on Milrinone 0.375 mcg with very poor LV function and severe AS, stage IV CHF. I think it is best to proceed with TAVR tomorrow. If we wait and send him home on milrinone and anticoagulation he will likely expire before his valve can be replaced and TAVR is his only chance of longer term survival. His risk of stroke is increased and I have discussed that with him. He understands and wishes to proceed with TAVR.  Following  the decision to proceed with transcatheter aortic valve replacement, a discussion has been held regarding what types of management strategies would be attempted intraoperatively in the event of life-threatening complications, including whether or not the patient would be considered a candidate for the use of cardiopulmonary bypass and/or conversion to open sternotomy for attempted surgical intervention.  The patient has been advised of a variety of complications that might develop peculiar to this approach including but not limited to risks of death, stroke, paravalvular leak, aortic dissection or other major vascular complications, aortic annulus rupture, device embolization, cardiac rupture or perforation, acute myocardial infarction, arrhythmia, heart block or bradycardia requiring permanent pacemaker placement, congestive heart failure, respiratory failure, renal failure, pneumonia, infection, other late complications related to structural valve deterioration or migration, or other complications that might ultimately cause a temporary or permanent loss of functional independence or other long term morbidity.  The patient provides full informed consent for the procedure as described and all questions were answered preoperatively.    DETAILS OF THE OPERATIVE PROCEDURE  PREPARATION:    The patient is brought to the operating room on the above mentioned date and central monitoring was established by the anesthesia team including placement of Swan-Ganz catheter and radial arterial line. The patient is placed in the supine position on the operating table.  Intravenous antibiotics are  administered. General endotracheal anesthesia is induced uneventfully.  A Foley catheter is placed.  Baseline transesophageal echocardiogram was performed. The patient's chest, abdomen, both groins, and both lower extremities are prepared and draped in a sterile manner. A time out procedure is performed.   Peripheral and  transfemoral access were performed by Dr. Clifton JamesMcAlhany and will be dictated in his note.  BALLOON AORTIC VALVULOPLASTY:   Not performed.  TRANSCATHETER HEART VALVE DEPLOYMENT:   An Edwards Sapien 3 transcatheter heart valve (size 26 mm, model #9600TFX, serial #[id number]) was prepared and crimped per manufacturer's guidelines, and the proper orientation of the valve is confirmed on the [employer] Commander delivery system. The valve was advanced through the introducer sheath using normal technique until in an appropriate position in the abdominal aorta beyond the sheath tip. The balloon was then retracted and using the fine-tuning wheel was centered on the valve. The valve was then advanced across the aortic arch using appropriate flexion of the catheter. The valve was carefully positioned across the aortic valve annulus. The Commander catheter was retracted using normal technique. Once final position of the valve has been confirmed by angiographic assessment, the valve is deployed while temporarily holding ventilation and during rapid ventricular pacing to maintain systolic blood pressure < 50 mmHg and pulse pressure < 10 mmHg. The balloon inflation is held for >3 seconds after reaching full deployment volume. Once the balloon has fully deflated the balloon is retracted into the ascending aorta and valve function is assessed using echocardiography. There is felt to be mild paravalvular leak and no central aortic insufficiency.  The patient's hemodynamic recovery following valve deployment is good.  The deployment balloon and guidewire are both removed. Echo demostrated acceptable post-procedural gradients, stable mitral valve function, and mild aortic insufficiency.    PROCEDURE COMPLETION:   The sheath was removed and femoral artery closure performed by Dr Clifton JamesMcAlhany. Please see his separate report for details.   Protamine was administered once femoral arterial repair was complete. The temporary pacemaker,  pigtail catheters and femoral sheaths were removed with manual pressure used for hemostasis.    Insertion of right chest tube:  The right chest was prepped with betadine and draped in a sterile manner. A 1 cm incision was made at the anterior axillary line just below the pectoralis muscle. The right pleural space was entered bluntly with a hemostat and a 28 F trocar chest tube was inserted without difficulty. 1300 cc of serosanguinous fluid was removed. The tube was sutured to the skin and a dry sterile dressing applied.  The patient tolerated the procedure well and is transported to the surgical intensive care in stable condition. There were no immediate intraoperative complications. All sponge instrument and needle counts are verified correct at completion of the operation.   The patient received a total of 38.8 mL of intravenous contrast during the procedure.   Alleen BorneBryan K Titan Karner, MD 09/05/2016

## 2016-09-05 NOTE — Anesthesia Procedure Notes (Signed)
Central Venous Catheter Insertion Performed by: anesthesiologist 09/05/2016 9:43 AM Patient location: Pre-op. Preanesthetic checklist: patient identified, IV checked, site marked, risks and benefits discussed, surgical consent, monitors and equipment checked, pre-op evaluation, timeout performed and anesthesia consent Position: Trendelenburg Lidocaine 1% used for infiltration Landmarks identified and Seldinger technique used Catheter size: 8.5 Fr PA cath was placed.Sheath introducer Swan type and PA catheter depth:thermodilation and 50PA Cath depth:50 Procedure performed using ultrasound guided technique. Attempts: 1 Following insertion, line sutured and dressing applied. Post procedure assessment: free fluid flow, blood return through all ports and no air. Patient tolerated the procedure well with no immediate complications.

## 2016-09-05 NOTE — CV Procedure (Signed)
[facility]  TAVR OPERATIVE NOTE   Date of Procedure:  09/05/2016  Preoperative Diagnosis: Severe Aortic Stenosis   Postoperative Diagnosis: Same   Procedure:    Transcatheter Aortic Valve Replacement - Transfemoral Approach  Edwards Sapien 3 THV (size 26 mm, model # [id number], serial # [id number])    Co-Surgeons:  Gaye Pollack, MD and Lauree Chandler, MD  Anesthesiologist:  Tobias Alexander  Echocardiographer:  Johnsie Cancel  Pre-operative Echo Findings:  Severe aortic stenosis  Severe left ventricular systolic dysfunction  Post-operative Echo Findings:  Mild paravalvular leak  Severe left ventricular systolic dysfunction  BRIEF CLINICAL NOTE AND INDICATIONS FOR SURGERY  The patient is a 65 year old gentleman with hypertension, cardiomyopathy and chronic systolic and diastolic heart failure, persistent atrial fibrillation on Eliquis, chronic pulmonary embolism, stage 3 CKD and known severe aortic stenosis who had an echo in 04/2016 showing an EF of 20-25% with a mean AV gradient of 40 mm Hg. He reports have a heart murmur diagnosed when he had a physical exam after being drafted for the [place] [employer] which kept him out of the service. There was never any further work up until he saw Dr. Geraldo Pitter in [place] about 2 years ago for development of shortness of breath and swelling in his legs. He underwent an extensive evaluation by Dr. Geraldo Pitter in [place] and was diagnosed with severe aortic stenosis, congestive heart failure, and atrial fibrillation. He was told that he should have surgery but declined. He says that he lost confidence in his medical care there because he kept having recurring episodes of shortness of breath and edema. He sought a second opinion from Dr. Claiborne Billings in May 2017. At that time he was in atrial fibrillation butnot on anticoagulation. Eliquis was started and the patient was treated for congestive heart failure using diuretics. His echo was as noted above  and showed a moderate pericardial effusion and some dilation of the ascending aorta with a question of an aortic dissection. CT angiogram of the chest showed aneurysmal dilatation of the ascending aorta with a maximum transverse diameter of 4.8 cm but noevidence for aortic dissection. He was noted to have a pulmonary embolus in a small segmental branch of the right lower lobe. He was seen again on 08/15/2016 complaining of worsening symptoms of shortness of breath, lower extremity edema, and early satiety. He was not taking his Lasix as previously prescribed and his weight was up about 20 pounds in comparison to his last previous office visit. His medications were adjusted but symptoms did not significantly improve so he was admitted to the hospital 08/24/2016 for intravenous diuretic therapy and further management. Repeat echocardiogram revealed further deterioration in the ejection fraction to 15%. There remained a moderate to large size circumferential pericardial effusion without evidence for pericardial tamponade. The patient was started on intravenous milrinone and aggressively diuresed. Cardiac catheterization on 08/29/2016 showed moderate nonobstructive coronary artery disease with low cardiac output and mixed venous oxygen saturation with elevated filling pressures. Milrinone was increased to .375. The patient responded well to diuretic therapy and his weight has come down 20 pounds since admission. He  underwent pericardiocentesis removing 750 mL of thin serous fluid. He says that he has felt much better since then and is able to eat, sleep, lie down flat and ambulate.  During the course of the patient's preoperative work up they have been evaluated comprehensively by a multidisciplinary team of specialists coordinated through the [facility] in the [place] [facility].  They have been demonstrated to suffer from symptomatic severe aortic stenosis as noted  above. The patient has been counseled extensively as to the relative risks and benefits of all options for the treatment of severe aortic stenosis including long term medical therapy, conventional surgery for aortic valve replacement, and transcatheter aortic valve replacement.  The patient has been independently evaluated by two cardiac surgeons including Dr Roxy Manns and Dr. Cyndia Bent, and they are felt to be at high risk for conventional surgical aortic valve replacement. Both surgeons indicated the patient would be a poor candidate for conventional surgery. Based upon review of all of the patient's preoperative diagnostic tests they are felt to be candidate for transcatheter aortic valve replacement using the transfemoral approach as an alternative to high risk conventional surgery.    Following the decision to proceed with transcatheter aortic valve replacement, a discussion has been held regarding what types of management strategies would be attempted intraoperatively in the event of life-threatening complications, including whether or not the patient would be considered a candidate for the use of cardiopulmonary bypass and/or conversion to open sternotomy for attempted surgical intervention.  The patient has been advised of a variety of complications that might develop peculiar to this approach including but not limited to risks of death, stroke, paravalvular leak, aortic dissection or other major vascular complications, aortic annulus rupture, device embolization, cardiac rupture or perforation, acute myocardial infarction, arrhythmia, heart block or bradycardia requiring permanent pacemaker placement, congestive heart failure, respiratory failure, renal failure, pneumonia, infection, other late complications related to structural valve deterioration or migration, or other complications that might ultimately cause a temporary or permanent loss of functional independence or other long term morbidity.  The patient  provides full informed consent for the procedure as described and all questions were answered preoperatively.    DETAILS OF THE OPERATIVE PROCEDURE  PREPARATION:   The patient is brought to the operating room on the above mentioned date and central monitoring was established by the anesthesia team including placement of Swan-Ganz catheter and radial arterial line. The patient is placed in the supine position on the operating table.  Intravenous antibiotics are administered. General endotracheal anesthesia is induced uneventfully. A Foley catheter is placed.  Baseline transesophageal echocardiogram was performed. The patient's chest, abdomen, both groins, and both lower extremities are prepared and draped in a sterile manner. A time out procedure is performed.   PERIPHERAL ACCESS:   Using the modified Seldinger technique, femoral arterial and venous access were obtained with placement of 6 Fr sheaths on the lef side.  A pigtail diagnostic catheter was passed through the femoral arterial sheath under fluoroscopic guidance into the aortic root.  A temporary transvenous pacemaker catheter was passed through the femoral venous sheath under fluoroscopic guidance into the right ventricle.  The pacemaker was tested to ensure stable lead placement and pacemaker capture. Aortic root angiography was performed in order to determine the optimal angiographic angle for valve deployment.  TRANSFEMORAL ACCESS:  Micropuncture kit used to obtain access into the right femoral artery. Sheath placement confirmed by angiography. Pre-closure with double ProGlide closure devices. The patient was heparinized systemically and ACT verified > 250 seconds.    A 14 Fr transfemoral E-sheath was introduced into the right femoral artery after progressively dilating over an Amplatz superstiff wire. An AL-2 catheter was used to direct a straight-tip exchange length wire across the native aortic valve into the left ventricle. This was  exchanged out for a pigtail catheter and position  was confirmed in the LV apex. Simultaneous LV and Ao pressures were recorded.  The pigtail catheter was then exchanged for an Amplatz Extra-stiff wire in the LV apex.   TRANSCATHETER HEART VALVE DEPLOYMENT:  An Edwards Sapien 3 THV (size 26 mm) was prepared and crimped per manufacturer's guidelines, and the proper orientation of the valve is confirmed on the [employer] delivery system. The valve was advanced through the introducer sheath using normal technique until in an appropriate position in the abdominal aorta beyond the sheath tip. The balloon was then retracted and using the fine-tuning wheel was centered on the valve. The valve was then advanced across the aortic arch using appropriate flexion of the catheter. The valve was carefully positioned across the aortic valve annulus. The Commander catheter was retracted using normal technique. Once final position of the valve has been confirmed by angiographic assessment, the valve is deployed while temporarily holding ventilation and during rapid ventricular pacing to maintain systolic blood pressure < 50 mmHg and pulse pressure < 10 mmHg. The balloon inflation is held for >3 seconds after reaching full deployment volume. Once the balloon has fully deflated the balloon is retracted into the ascending aorta and valve function is assessed using TEE. There is felt to be mild paravalvular leak and no central aortic insufficiency.  The patient's hemodynamic recovery following valve deployment is good. The deployment balloon and guidewire are both removed. Echo demostrated acceptable post-procedural gradients, stable mitral valve function, and mild AI.    PROCEDURE COMPLETION:  The closure device procedure was completed. The temporary pacemaker, pigtail catheters and femoral sheaths were removed with manual pressure used for hemostasis.   The patient tolerated the procedure well and is transported to the  surgical intensive care in stable condition. There were no immediate intraoperative complications. All sponge instrument and needle counts are verified correct at completion of the operation.   No blood products were administered during the operation.  The patient received a total of 38.8  mL of intravenous contrast during the procedure.  Lauree Chandler MD 09/05/2016 11:33 AM

## 2016-09-05 NOTE — H&P (View-Only) (Signed)
Patient ID: Lucas Munoz, male   DOB: May 17, 1951, 65 y.o.   MRN: [id number]    SUBJECTIVE:   PICC placed , initial co-ox on 10/14 47%.  He was started on milrinone. Milrinone increased to 0.375 mcg 08/29/16 for low mixed venous sat on RHC (CI 1.9).  Underwent pericardiocentesis on 10/20 with 775cc out. Fluid transudative. Drained another 225 cc yesterday  Feeling good generally. Denies SOB. CVP 8  Today's CO-OX 55%   Creatinine stable at 1.4  Being planned for TAVR today. Drs. Bartle and Cornelius Moras have seen. Has had CTs done, concern for LA appendage thrombus based on CT. TEE 10/23 confirmed amorphous material in LA appendage, appears to be forming thrombus.   Pericardial drain removed this morning.   RHC/LHC (08/29/16) Left Main  Mild ostial narrowing.  Left Anterior Descending  30% proximal LAD stenosis. 60% distal LAD stenosis. Moderate D1 without significant disease. Small to moderate D2 with long 70% ostial stenosis.  Left Circumflex  50% proximal LCx stenosis at OM1. Moderate OM1 with 30% ostial stenosis.  Right Coronary Artery  40% proximal and 40% mid RCA stenosis.   RA mean 19 RV 65/15 PA 69/34, mean 51 PCWP mean 43 (not sure we got an accurate PCWP).  AO 106/87 Oxygen saturations: PA 46% AO 92% Cardiac Output (Fick) 3.8  Cardiac Index (Fick) 1.9  Echo (10/14): EF 15%, severe AS, moderately decreased RV systolic function, PASP 65 mmHg, moderate to large pericardial effusion without tamponade.   Scheduled Meds: . aspirin  81 mg Oral Daily  . atorvastatin  40 mg Oral q1800  . bisacodyl  5 mg Oral Once  . cefUROXime (ZINACEF)  IV  1.5 g Intravenous To OR  . dexmedetomidine  0.1-0.7 mcg/kg/hr Intravenous To OR  . digoxin  0.125 mg Oral Daily  . DOPamine  0-10 mcg/kg/min Intravenous To OR  . epinephrine  0-10 mcg/min Intravenous To OR  . famotidine  10 mg Oral BID  . furosemide  60 mg Oral BID  . heparin 30,000 units/NS 1000 mL solution for CELLSAVER   Other To OR  .  insulin (NOVOLIN-R) infusion   Intravenous To OR  . magnesium sulfate  40 mEq Other To OR  . nitroGLYCERIN  2-200 mcg/min Intravenous To OR  . norepinephrine (LEVOPHED) Adult infusion  0-10 mcg/min Intravenous To OR  . phenylephrine (NEO-SYNEPHRINE) Adult infusion  30-200 mcg/min Intravenous To OR  . polyethylene glycol  17 g Oral Daily  . potassium chloride  80 mEq Other To OR  . sodium chloride flush  10-40 mL Intracatheter Q12H  . sodium chloride flush  3 mL Intravenous Q12H  . spironolactone  25 mg Oral Daily  . vancomycin  1,250 mg Intravenous To OR   Continuous Infusions: . sodium chloride 20 mL/hr at 09/04/16 1205  . amiodarone 30 mg/hr (09/04/16 2000)  . heparin 1,900 Units/hr (09/04/16 2000)  . milrinone 0.375 mcg/kg/min (09/05/16 0440)  . norepinephrine (LEVOPHED) Adult infusion     PRN Meds:.sodium chloride, acetaminophen, ALPRAZolam, alum & mag hydroxide-simeth, docusate sodium, lactulose, ondansetron (ZOFRAN) IV, oxyCODONE-acetaminophen, sodium chloride flush, sodium chloride flush   Vitals:   09/04/16 2200 09/04/16 2300 09/05/16 0335 09/05/16 0400  BP:   91/68 95/71  Pulse: 79 (!) 47 68 61  Resp: 16 20 16 15   Temp:  97.5 F (36.4 C) 98.4 F (36.9 C)   TempSrc:  Oral Oral   SpO2: 97% 94% 95% 94%  Weight:   179 lb 3.7 oz (81.3 kg)  Height:        Intake/Output Summary (Last 24 hours) at 09/05/16 0735 Last data filed at 09/05/16 0700  Gross per 24 hour  Intake          1043.15 ml  Output             2475 ml  Net         -1431.85 ml    LABS: Basic Metabolic Panel:  Recent Labs  16/10/96 0427 09/05/16 0315  NA 132* 133*  K 3.9 4.0  CL 99* 97*  CO2 27 29  GLUCOSE 116* 138*  BUN 16 16  CREATININE 1.43* 1.40*  CALCIUM 8.5* 8.3*   Liver Function Tests: No results for input(s): AST, ALT, ALKPHOS, BILITOT, PROT, ALBUMIN in the last 72 hours. No results for input(s): LIPASE, AMYLASE in the last 72 hours. CBC:  Recent Labs  09/04/16 0427  09/05/16 0315  WBC 8.2 6.2  HGB 11.8* 11.4*  HCT 37.4* 36.4*  MCV 82.2 81.8  PLT 134* 165   Cardiac Enzymes: No results for input(s): CKTOTAL, CKMB, CKMBINDEX, TROPONINI in the last 72 hours. BNP: Invalid input(s): POCBNP D-Dimer: No results for input(s): DDIMER in the last 72 hours. Hemoglobin A1C: No results for input(s): HGBA1C in the last 72 hours. Fasting Lipid Panel: No results for input(s): CHOL, HDL, LDLCALC, TRIG, CHOLHDL, LDLDIRECT in the last 72 hours. Thyroid Function Tests: No results for input(s): TSH, T4TOTAL, T3FREE, THYROIDAB in the last 72 hours.  Invalid input(s): FREET3 Anemia Panel: No results for input(s): VITAMINB12, FOLATE, FERRITIN, TIBC, IRON, RETICCTPCT in the last 72 hours.  RADIOLOGY: Ct Cardiac Morph/pulm Vein W/cm&w/o Ca Score  Addendum Date: 09/01/2016   ADDENDUM REPORT: 09/01/2016 17:18 CLINICAL DATA:  Aortic stenosis EXAM: Cardiac TAVR CT TECHNIQUE: The patient was scanned on a Philips 256 scanner. A 120 kV retrospective scan was triggered in the descending thoracic aorta at 111 HU's. Gantry rotation speed was 270 msecs and collimation was .9 mm. No beta blockade or nitro were given. The 3D data set was reconstructed in 5% intervals of the R-R cycle. Systolic and diastolic phases were analyzed on a dedicated work station using MPR, MIP and VRT modes. The patient received 80 cc of contrast. 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 Heavily calcified Aorta:  Aortic root dilated Sinotubular Junction:  32 mm Ascending Thoracic Aorta:  43 mm Aortic Arch:  30 mm Descending Thoracic Aorta:  26 mm Sinus of Valsalva Measurements: Non-coronary:  33 mm Right -coronary:  31 mm Left -coronary:  32 mm Coronary Artery Height above Annulus: Left Main:  19 mm above annulus Right Coronary:  10 mm above annulus Virtual Basal Annulus Measurements: Maximum/Minimum Diameter:  27.1 x 20.3 mm Perimeter:  78 mm Area:  442 mm2 Coronary  Arteries:  Sufficient height above annulus for deployment Optimum Fluoroscopic Angle for Delivery:  LAO 29 degrees IMPRESSION: 1) functionally bicuspid AV heavily calcified with annulus suitable for 26 mm Sapien 3 valve 2) Moderate aortic root enlargement 4.3 cm 3) Large pericardial effusion patient to have pericardiocentesis post CT 4) Cannot r/o LAA thrombus suggest TEE correlation 5) Optimum angiographic angle for deployment LAO 29 degrees 6) Coronary heights sufficient for deployment Charlton Haws Electronically Signed   By: Charlton Haws M.D.   On: 09/01/2016 17:18   Result Date: 09/01/2016 EXAM: OVER-READ INTERPRETATION  CT CHEST The following report is an over-read performed by radiologist Dr. Royal Piedra [facility] on 09/01/2016. This over-read  does not include interpretation of cardiac or coronary anatomy or pathology. The coronary calcium score/coronary CTA interpretation by the cardiologist is attached. COMPARISON:  Chest CT 05/01/2016. FINDINGS: Large pericardial effusion. No pericardial calcification. Aortic atherosclerosis with aneurysmal dilatation of the ascending thoracic aorta which measures up to 4.8 cm in diameter (unchanged). Large right-sided pleural effusion layering dependently. Extensive passive atelectasis in the right lower lobe. Tiny calcified granuloma in the periphery of the right lower lobe. 4 mm subpleural noncalcified pulmonary nodule in the left lower lobe abutting the major fissure (image 71 of series 512), unchanged compared to prior study 05/01/2016, favored to represent a subpleural lymph node. No other suspicious appearing pulmonary nodules or masses are noted in the visualized portions of the thorax. Partially calcified pleural plaque in the right hemithorax (image 31 of series 513) again noted. No calcified pleural plaques in the left hemithorax. Visualized portions of the upper abdomen demonstrate a nodular contour of the liver, suggestive of underlying  cirrhosis. No aggressive appearing lytic or blastic lesions are noted in the visualized portions of the skeleton. Right upper extremity PICC with tip terminating in the distal superior vena cava. IMPRESSION: 1. Interval increased size of what is now a large pericardial effusion. No associated pericardial calcification. 2. Enlargement of a large layering right-sided pleural effusion. This is associated with extensive passive atelectasis in the right lower lobe. 3. Aortic atherosclerosis, with similar aneurysmal dilatation of the ascending thoracic aorta which measures up to 4.8 cm in diameter. 4. Morphologic changes in the liver indicative of cirrhosis, as above. 5. 4 mm subpleural nodule in the periphery of the left lower lobe is nonspecific, but statistically likely a subpleural lymph node. No follow-up needed if patient is low-risk. Non-contrast chest CT can be considered in 12 months if patient is high-risk. This recommendation follows the consensus statement: Guidelines for Management of Incidental Pulmonary Nodules Detected on CT Images: From the Fleischner Society 2017; Radiology 2017; 284:228-243. 6. Additional incidental findings, as above. Electronically Signed: By: Trudie Reed M.D. On: 09/01/2016 14:38   Dg Chest Port 1 View  Result Date: 08/26/2016 CLINICAL DATA:  65 year old male with a history of shortness of breath EXAM: PORTABLE CHEST 1 VIEW COMPARISON:  08/25/2016, CT chest 05/01/2016 FINDINGS: Compare to the prior plain film there is unchanged size of the cardiac silhouette. Calcifications of the aortic arch. Interval placement of right upper extremity PICC with the tip appearing to terminate at the superior cavoatrial junction. Increasing opacity at the right base with thickening of the minor fissure, obscuration the right hemidiaphragm and right heart border. Interlobular septal thickening. IMPRESSION: Evidence of CHF with increasing right-sided pleural effusion/atelectasis. Unchanged  cardiac silhouette in this patient with a history of pericardial effusion imaged on prior CT 05/01/2016. Interval placement of right upper extremity PICC. Aortic atherosclerosis. Signed, Yvone Neu. Loreta Ave, DO [facility] Electronically Signed   By: Gilmer Mor D.O.   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 Dilated cardiomyopathy. Acute kidney injury. EXAM: PORTABLE CHEST 1 VIEW COMPARISON:  None. FINDINGS: Marked cardiac enlargement noted. Diffuse interstitial infiltrates consistent with mild interstitial edema. Small right pleural effusion also seen with right basilar atelectasis. IMPRESSION: Mild congestive heart failure, with small right pleural effusion and right basilar atelectasis. Electronically Signed   By: Myles Rosenthal M.D.   On: 08/25/2016 15:14   Ct Angio Chest/abd/pel For Dissection W  And/or W/wo  Result Date: 09/03/2016 CLINICAL DATA:  65 year old male with history of severe aortic stenosis. Preprocedural study prior to potential transcatheter aortic valve replacement. Recent history of pericardiocentesis. EXAM: CT ANGIOGRAPHY CHEST, ABDOMEN AND PELVIS TECHNIQUE: Multidetector CT imaging through the chest, abdomen and pelvis was performed using the standard protocol during bolus administration of intravenous contrast. Multiplanar reconstructed images and MIPs were obtained and reviewed to evaluate the vascular anatomy. CONTRAST:  80 mL of Isovue 370. COMPARISON:  Cardiac gated CTA 09/01/2016. FINDINGS: CTA CHEST FINDINGS Cardiovascular: Heart size is mildly enlarged. Small amount of residual pericardial fluid and/or thickening, decreased compared to the prior study. Pericardiocentesis catheter in place with tip terminating high in a superior pericardial recess adjacent to the aortic arch. No pericardial calcification. Severe thickening  calcification of the aortic valve, which has an appearance suggestive of a bicuspid valve. There is aortic atherosclerosis, as well as atherosclerosis of the great vessels of the mediastinum and the coronary arteries, including calcified atherosclerotic plaque in the left anterior descending and right coronary arteries. 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 Large filling defect within the left atrial appendage, concerning for potential left atrial appendage thrombus. Right upper extremity PICC with tip terminating at the superior cavoatrial junction. Mediastinum/Lymph Nodes: Several borderline enlarged and minimally enlarged mediastinal lymph nodes measuring up to 14 mm in short axis, slightly increased compared to the prior study, likely reactive. Esophagus is unremarkable in appearance. No axillary lymphadenopathy. Lungs/Pleura: Large right pleural effusion again noted, lying dependently. This is associated with near complete passive atelectasis of the right lower lobe. Trace left pleural effusion with minimal passive subsegmental atelectasis in the dependent left lower lobe. No acute consolidative airspace disease. Previously noted tiny subpleural known nodule in the anterior aspect of the left lower lobe is no longer noted, presumably a reactive subpleural lymph node on the prior examination. Partially calcified small right-sided pleural plaque is unchanged (image 63 of series 5). No other calcified pleural plaques are identified. Tiny calcified granulomas are again noted in the periphery of the right lower lobe, now with an atelectatic lung. Musculoskeletal/Soft Tissues: There are no aggressive appearing lytic or blastic lesions noted in the visualized portions of the skeleton. CTA ABDOMEN AND PELVIS FINDINGS Hepatobiliary: Liver has a shrunken appearance and nodular contour, compatible with underlying cirrhosis. No discrete cystic or solid hepatic lesions. No intra or  extrahepatic biliary ductal dilatation. Large rim calcified gallstone in the gallbladder measuring up to 3.8 cm in diameter. No current findings to suggest an acute cholecystitis at this time. Pancreas: No pancreatic mass or peripancreatic inflammatory changes. No pancreatic ductal dilatation. Pancreatic atrophy. No pancreatic or peripancreatic fluid. Spleen: Unremarkable. Adrenals/Urinary Tract: Bilateral kidneys and bilateral adrenal glands are unremarkable in appearance. No hydroureteronephrosis. In the dependent portion of the urinary bladder there is a 12 mm calculus (image 254 of series 5). Urinary bladder is otherwise unremarkable in appearance. Stomach/Bowel: Normal appearance of the stomach. No pathologic dilatation of small bowel or colon. Numerous colonic diverticulae are noted, without surrounding inflammatory changes to suggest an acute diverticulitis at this time. The appendix is not confidently identified and may be surgically absent. Regardless, there are no inflammatory changes noted adjacent to the cecum to suggest the presence of an acute appendicitis at this time. Vascular/Lymphatic: Aortic atherosclerosis with vascular findings and measurements pertinent to potential TAVR procedure, as detailed below. No aneurysm or dissection identified in the abdominal or pelvic vasculature. Celiac axis, superior mesenteric artery and inferior  mesenteric artery are all widely patent without hemodynamically significant stenosis. Single renal arteries bilaterally are widely patent. Portal vein is mildly dilated measuring 16 mm in the porta hepatis. Reproductive: Prostate gland and seminal vesicles are unremarkable in appearance. Other: Small left inguinal hernia containing a short segment of small bowel. No significant volume of ascites. No pneumoperitoneum. Musculoskeletal: There are no aggressive appearing lytic or blastic lesions noted in the visualized portions of the skeleton. VASCULAR MEASUREMENTS  PERTINENT TO TAVR: AORTA: Minimal Aortic Diameter -  18 x 14 mm Severity of Aortic Calcification -  moderate RIGHT PELVIS: Right Common Iliac Artery - Minimal Diameter - 11.0 x 9.1 mm Tortuosity - moderate Calcification - mild to moderate Right External Iliac Artery - Minimal Diameter - 8.7 x 8.9 mm Tortuosity - moderate to severe Calcification - none Right Common Femoral Artery - Minimal Diameter - 9.2 x 6.8 mm Tortuosity - mild Calcification - mild LEFT PELVIS: Left Common Iliac Artery - Minimal Diameter - 9.5 x 10.0 mm Tortuosity - severe Calcification - none Left External Iliac Artery - Minimal Diameter - 8.1 x 7.9 mm Tortuosity - moderate Calcification - none Left Common Femoral Artery - Minimal Diameter - 8.5 x 6.3 mm Tortuosity - mild Calcification - mild Review of the MIP images confirms the above findings. IMPRESSION: 1. Vascular findings and measurements pertinent to potential TAVR procedure, as detailed above. This patient appears to have suitable pelvic arterial access, however, this access is most optimal on the right side secondary to the lesser degree of tortuosity. Additionally, there is a small left inguinal hernia containing a short segment of small bowel which comes in close proximity to the left common femoral artery. 2. Severe thickening calcification of what appears to be a bicuspid aortic valve, compatible with the reported clinical history of severe aortic stenosis. 3. Interval pericardiocentesis with persistent indwelling pericardiocentesis catheter, with significant decreased size of what is now only a small amount of residual pericardial fluid and/or thickening. 4. Large filling defect in the tip of the left atrial appendage, concerning for potential left atrial appendage thrombus (although this could simply reflect pseudo thrombus secondary to contrast bolus timing). Correlation with TEE is recommended, if not already performed. 5. Aortic atherosclerosis, in addition to 2 vessel coronary  artery disease. Please note that although the presence of coronary artery calcium documents the presence of coronary artery disease, the severity of this disease and any potential stenosis cannot be assessed on this non-gated CT examination. Assessment for potential risk factor modification, dietary therapy or pharmacologic therapy may be warranted, if clinically indicated. 6. Persistent large right-sided pleural effusion with extensive passive atelectasis in the right lower lobe. Trace left pleural effusion with minimal dependent passive atelectasis in the left lower lobe. 7. Cholelithiasis without evidence of acute cholecystitis. 8. Colonic diverticulosis without evidence of acute diverticulitis at this time. 9. Morphologic changes in the liver compatible with underlying cirrhosis. 10. Additional incidental findings, as above. Electronically Signed   By: Trudie Reedaniel  Entrikin M.D.   On: 09/03/2016 12:24    PHYSICAL EXAM General: NAD Neck: JVP 7, no thyromegaly or thyroid nodule.  Lungs: Clear  CV: Nondisplaced PMI.  Heart mildly tachy, irregular S1/S2, no S3/S4, 3/6 crescendo-decrescendo murmur RUSB. Trace-1+ ankle edema.  Pericardial drain in place Abdomen: Soft, NT, ND, no HSM. No bruits or masses. +BS  Neurologic: Alert and oriented x 3.  Psych: Normal affect. Extremities: No clubbing or cyanosis. RUE PICC  TELEMETRY: Reviewed, A fib 90s   ASSESSMENT AND  PLAN: 65 y/o ?with a h/o severe AS, LV dysfxn, HTN, noncompliance, thyroid nodules, pericardial effusion, cholelithiasis, and persistent AF on eliquis, who presentedto clinic on 08/23/16 with volume overload and was admitted for further work up of AS and CHF.  1. Acute on chronic systolic CHF: EF 16-10% in setting of severe aortic stenosis.  May be due to severe aortic stenosis given lack of flow-limiting CAD.  This admission, he was initially hypotensive with SBP in 80s though BP has improved to 100s-110s range.  He was also markedly volume  overloaded. PICC was placed, low output confirmed by co-ox 47%.  Milrinone 0.25 started then increased to 0.375, co-ox marginal at 55% today.  Creatinine 1.4 today.  CVP 8.  Now on po Lasix - Continue Lasix 60 mg po bid. - Continue milrinone 0.375 mcg/kg/min, will continue through TAVR then try to wean.   - Continue spiro 25 mg daily.   - Continue digoxin, level ok this week.  2. Aortic stenosis: Severe AS.  Will need AVR, best suited for TAVR.   - Drs. McAlhany and Cornelius Moras have seen. Has had CTs. - Concern for LAA thrombus on CT, TEE 10/23 confirmed amorphous material in LAA that appears to be developing thrombus.  He is on heparin gtt and therapeutic, prior to that was on apixaban.  I have had extensive discussions with Drs Clifton James and Laneta Simmers as well as the patient.  Dr Laneta Simmers spoke with the patient last night.  He will be at higher stroke risk with TAVR because of the LA appendage developing thrombus.  He understands this.  However, we think he would be unlikely to survive a month-long delay to try warfarin (he is currently dependent on milrinone with marginal co-ox), and based on his course so far, I am not sure that the LA appendage would fully clear (has been anticoagulated long-term, heparin in hospital and apixaban prior to hospitalization).  Mr Ramirez wishes to proceed with TAVR, and given the circumstances we have agreed that this is reasonable though higher risk.  3. AKI on CKD stage III: Baseline creatinine 1.7-1.8.  Stable at 1.4 today.  - Continue to follow closely with po Lasix and contrast exposure with CTs.  4. Atrial fibrillation: Persistent, with RVR this admission.  He has been in atrial fibrillation at least since 6/17.  No prior DCCV.  Not sure how long prior to 6/17 he was in atrial fibrillation.  - Continue amiodarone gtt for now for rate control, HR now in 90s at rest with milrinone running.  - Continue heparin gtt (was on apixaban at home).  - Continue digoxin. - Will not be able  to do DCCV at this point with atrial thrombus.  5. Pericardial effusion: Moderate to large, no tamponade.  s/p pericardiocentesis with Dr Clifton James 10/20. 775 ccs fluid out. Fluid is transudative.  Pericardial drain removed today.   6. Cirrhosis: Noted on prior abdominal CT, LFTs improved with diuresis.  7. CAD:  LHC with moderate coronary disease, nothing flow-limiting.  - Continue statin and 81 mg aspirin.   35 minutes critical care time.   Marca Ancona MD 09/05/2016 7:35 AM

## 2016-09-05 NOTE — Interval H&P Note (Signed)
History and Physical Interval Note:  09/05/2016 10:27 AM  Shmiel Tierce  has presented today for surgery, with the diagnosis of SEVERE AS  The various methods of treatment have been discussed with the patient and family. After consideration of risks, benefits and other options for treatment, the patient has consented to  Procedure(s): TRANSCATHETER AORTIC VALVE REPLACEMENT, TRANSFEMORAL (N/A) TRANSESOPHAGEAL ECHOCARDIOGRAM (TEE) (N/A) as a surgical intervention .  The patient's history has been reviewed, patient examined, no change in status, stable for surgery.  I have reviewed the patient's chart and labs.  Questions were answered to the patient's satisfaction.     Annalei Friesz   

## 2016-09-05 NOTE — Progress Notes (Addendum)
Patient ID: Lucas Munoz, male   DOB: Oct 09, 1951, 65 y.o.   MRN: [id number]   SICU Evening Rounds:   Hemodynamically stable  CI = 2.3 on milrinone  Awake on vent. Following commands. Normal strength in all ext  Urine output good  CT output low Groin access sites ok  CBC    Component Value Date/Time   WBC 8.2 09/05/2016 1400   RBC 4.47 09/05/2016 1400   HGB 11.6 (L) 09/05/2016 1400   HCT 36.7 (L) 09/05/2016 1400   PLT 134 (L) 09/05/2016 1400   MCV 82.1 09/05/2016 1400   MCH 26.0 09/05/2016 1400   MCHC 31.6 09/05/2016 1400   RDW 17.9 (H) 09/05/2016 1400   LYMPHSABS 1.2 08/24/2016 1448   MONOABS 0.8 08/24/2016 1448   EOSABS 0.0 08/24/2016 1448   BASOSABS 0.1 08/24/2016 1448     BMET    Component Value Date/Time   NA 139 09/05/2016 1345   K 3.2 (L) 09/05/2016 1345   CL 97 (L) 09/05/2016 0315   CO2 29 09/05/2016 0315   GLUCOSE 103 (H) 09/05/2016 1345   BUN 16 09/05/2016 0315   CREATININE 1.40 (H) 09/05/2016 0315   CREATININE 1.79 (H) 08/18/2016 1503   CALCIUM 8.3 (L) 09/05/2016 0315   GFRNONAA 50 (L) 09/05/2016 0315   GFRAA 59 (L) 09/05/2016 0315     A/P:  Stable postop course. Extubate. Continue milrinone. Plan to resume heparin 6 hrs postop.

## 2016-09-05 NOTE — Transfer of Care (Signed)
Immediate Anesthesia Transfer of Care Note  Patient: Lucas Munoz  Procedure(s) Performed: Procedure(s): TRANSCATHETER AORTIC VALVE REPLACEMENT, TRANSFEMORAL (N/A) TRANSESOPHAGEAL ECHOCARDIOGRAM (TEE) (N/A) CHEST TUBE INSERTION (Right)  Patient Location: ICU  Anesthesia Type:General  Level of Consciousness: Patient remains intubated per anesthesia plan  Airway & Oxygen Therapy: Patient remains intubated per anesthesia plan and Patient placed on Ventilator (see vital sign flow sheet for setting)  Post-op Assessment: Report given to RN  Post vital signs: Reviewed and stable  Last Vitals:  Vitals:   09/05/16 0800 09/05/16 1215  BP: 101/69   Pulse: 78 68  Resp: 15   Temp:      Last Pain:  Vitals:   09/05/16 0741  TempSrc: Oral  PainSc:       Patients Stated Pain Goal: 2 (09/04/16 1805)  Complications: No apparent anesthesia complications

## 2016-09-05 NOTE — Progress Notes (Signed)
Patient ID: Lucas Munoz, male   DOB: May 17, 1951, 65 y.o.   MRN: [id number]    SUBJECTIVE:   PICC placed , initial co-ox on 10/14 47%.  He was started on milrinone. Milrinone increased to 0.375 mcg 08/29/16 for low mixed venous sat on RHC (CI 1.9).  Underwent pericardiocentesis on 10/20 with 775cc out. Fluid transudative. Drained another 225 cc yesterday  Feeling good generally. Denies SOB. CVP 8  Today's CO-OX 55%   Creatinine stable at 1.4  Being planned for TAVR today. Drs. Bartle and Cornelius Moras have seen. Has had CTs done, concern for LA appendage thrombus based on CT. TEE 10/23 confirmed amorphous material in LA appendage, appears to be forming thrombus.   Pericardial drain removed this morning.   RHC/LHC (08/29/16) Left Main  Mild ostial narrowing.  Left Anterior Descending  30% proximal LAD stenosis. 60% distal LAD stenosis. Moderate D1 without significant disease. Small to moderate D2 with long 70% ostial stenosis.  Left Circumflex  50% proximal LCx stenosis at OM1. Moderate OM1 with 30% ostial stenosis.  Right Coronary Artery  40% proximal and 40% mid RCA stenosis.   RA mean 19 RV 65/15 PA 69/34, mean 51 PCWP mean 43 (not sure we got an accurate PCWP).  AO 106/87 Oxygen saturations: PA 46% AO 92% Cardiac Output (Fick) 3.8  Cardiac Index (Fick) 1.9  Echo (10/14): EF 15%, severe AS, moderately decreased RV systolic function, PASP 65 mmHg, moderate to large pericardial effusion without tamponade.   Scheduled Meds: . aspirin  81 mg Oral Daily  . atorvastatin  40 mg Oral q1800  . bisacodyl  5 mg Oral Once  . cefUROXime (ZINACEF)  IV  1.5 g Intravenous To OR  . dexmedetomidine  0.1-0.7 mcg/kg/hr Intravenous To OR  . digoxin  0.125 mg Oral Daily  . DOPamine  0-10 mcg/kg/min Intravenous To OR  . epinephrine  0-10 mcg/min Intravenous To OR  . famotidine  10 mg Oral BID  . furosemide  60 mg Oral BID  . heparin 30,000 units/NS 1000 mL solution for CELLSAVER   Other To OR  .  insulin (NOVOLIN-R) infusion   Intravenous To OR  . magnesium sulfate  40 mEq Other To OR  . nitroGLYCERIN  2-200 mcg/min Intravenous To OR  . norepinephrine (LEVOPHED) Adult infusion  0-10 mcg/min Intravenous To OR  . phenylephrine (NEO-SYNEPHRINE) Adult infusion  30-200 mcg/min Intravenous To OR  . polyethylene glycol  17 g Oral Daily  . potassium chloride  80 mEq Other To OR  . sodium chloride flush  10-40 mL Intracatheter Q12H  . sodium chloride flush  3 mL Intravenous Q12H  . spironolactone  25 mg Oral Daily  . vancomycin  1,250 mg Intravenous To OR   Continuous Infusions: . sodium chloride 20 mL/hr at 09/04/16 1205  . amiodarone 30 mg/hr (09/04/16 2000)  . heparin 1,900 Units/hr (09/04/16 2000)  . milrinone 0.375 mcg/kg/min (09/05/16 0440)  . norepinephrine (LEVOPHED) Adult infusion     PRN Meds:.sodium chloride, acetaminophen, ALPRAZolam, alum & mag hydroxide-simeth, docusate sodium, lactulose, ondansetron (ZOFRAN) IV, oxyCODONE-acetaminophen, sodium chloride flush, sodium chloride flush   Vitals:   09/04/16 2200 09/04/16 2300 09/05/16 0335 09/05/16 0400  BP:   91/68 95/71  Pulse: 79 (!) 47 68 61  Resp: 16 20 16 15   Temp:  97.5 F (36.4 C) 98.4 F (36.9 C)   TempSrc:  Oral Oral   SpO2: 97% 94% 95% 94%  Weight:   179 lb 3.7 oz (81.3 kg)  Height:        Intake/Output Summary (Last 24 hours) at 09/05/16 0735 Last data filed at 09/05/16 0700  Gross per 24 hour  Intake          1043.15 ml  Output             2475 ml  Net         -1431.85 ml    LABS: Basic Metabolic Panel:  Recent Labs  16/10/96 0427 09/05/16 0315  NA 132* 133*  K 3.9 4.0  CL 99* 97*  CO2 27 29  GLUCOSE 116* 138*  BUN 16 16  CREATININE 1.43* 1.40*  CALCIUM 8.5* 8.3*   Liver Function Tests: No results for input(s): AST, ALT, ALKPHOS, BILITOT, PROT, ALBUMIN in the last 72 hours. No results for input(s): LIPASE, AMYLASE in the last 72 hours. CBC:  Recent Labs  09/04/16 0427  09/05/16 0315  WBC 8.2 6.2  HGB 11.8* 11.4*  HCT 37.4* 36.4*  MCV 82.2 81.8  PLT 134* 165   Cardiac Enzymes: No results for input(s): CKTOTAL, CKMB, CKMBINDEX, TROPONINI in the last 72 hours. BNP: Invalid input(s): POCBNP D-Dimer: No results for input(s): DDIMER in the last 72 hours. Hemoglobin A1C: No results for input(s): HGBA1C in the last 72 hours. Fasting Lipid Panel: No results for input(s): CHOL, HDL, LDLCALC, TRIG, CHOLHDL, LDLDIRECT in the last 72 hours. Thyroid Function Tests: No results for input(s): TSH, T4TOTAL, T3FREE, THYROIDAB in the last 72 hours.  Invalid input(s): FREET3 Anemia Panel: No results for input(s): VITAMINB12, FOLATE, FERRITIN, TIBC, IRON, RETICCTPCT in the last 72 hours.  RADIOLOGY: Ct Cardiac Morph/pulm Vein W/cm&w/o Ca Score  Addendum Date: 09/01/2016   ADDENDUM REPORT: 09/01/2016 17:18 CLINICAL DATA:  Aortic stenosis EXAM: Cardiac TAVR CT TECHNIQUE: The patient was scanned on a Philips 256 scanner. A 120 kV retrospective scan was triggered in the descending thoracic aorta at 111 HU's. Gantry rotation speed was 270 msecs and collimation was .9 mm. No beta blockade or nitro were given. The 3D data set was reconstructed in 5% intervals of the R-R cycle. Systolic and diastolic phases were analyzed on a dedicated work station using MPR, MIP and VRT modes. The patient received 80 cc of contrast. 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 Heavily calcified Aorta:  Aortic root dilated Sinotubular Junction:  32 mm Ascending Thoracic Aorta:  43 mm Aortic Arch:  30 mm Descending Thoracic Aorta:  26 mm Sinus of Valsalva Measurements: Non-coronary:  33 mm Right -coronary:  31 mm Left -coronary:  32 mm Coronary Artery Height above Annulus: Left Main:  19 mm above annulus Right Coronary:  10 mm above annulus Virtual Basal Annulus Measurements: Maximum/Minimum Diameter:  27.1 x 20.3 mm Perimeter:  78 mm Area:  442 mm2 Coronary  Arteries:  Sufficient height above annulus for deployment Optimum Fluoroscopic Angle for Delivery:  LAO 29 degrees IMPRESSION: 1) functionally bicuspid AV heavily calcified with annulus suitable for 26 mm Sapien 3 valve 2) Moderate aortic root enlargement 4.3 cm 3) Large pericardial effusion patient to have pericardiocentesis post CT 4) Cannot r/o LAA thrombus suggest TEE correlation 5) Optimum angiographic angle for deployment LAO 29 degrees 6) Coronary heights sufficient for deployment Charlton Haws Electronically Signed   By: Charlton Haws M.D.   On: 09/01/2016 17:18   Result Date: 09/01/2016 EXAM: OVER-READ INTERPRETATION  CT CHEST The following report is an over-read performed by radiologist Dr. Royal Piedra [facility] on 09/01/2016. This over-read  does not include interpretation of cardiac or coronary anatomy or pathology. The coronary calcium score/coronary CTA interpretation by the cardiologist is attached. COMPARISON:  Chest CT 05/01/2016. FINDINGS: Large pericardial effusion. No pericardial calcification. Aortic atherosclerosis with aneurysmal dilatation of the ascending thoracic aorta which measures up to 4.8 cm in diameter (unchanged). Large right-sided pleural effusion layering dependently. Extensive passive atelectasis in the right lower lobe. Tiny calcified granuloma in the periphery of the right lower lobe. 4 mm subpleural noncalcified pulmonary nodule in the left lower lobe abutting the major fissure (image 71 of series 512), unchanged compared to prior study 05/01/2016, favored to represent a subpleural lymph node. No other suspicious appearing pulmonary nodules or masses are noted in the visualized portions of the thorax. Partially calcified pleural plaque in the right hemithorax (image 31 of series 513) again noted. No calcified pleural plaques in the left hemithorax. Visualized portions of the upper abdomen demonstrate a nodular contour of the liver, suggestive of underlying  cirrhosis. No aggressive appearing lytic or blastic lesions are noted in the visualized portions of the skeleton. Right upper extremity PICC with tip terminating in the distal superior vena cava. IMPRESSION: 1. Interval increased size of what is now a large pericardial effusion. No associated pericardial calcification. 2. Enlargement of a large layering right-sided pleural effusion. This is associated with extensive passive atelectasis in the right lower lobe. 3. Aortic atherosclerosis, with similar aneurysmal dilatation of the ascending thoracic aorta which measures up to 4.8 cm in diameter. 4. Morphologic changes in the liver indicative of cirrhosis, as above. 5. 4 mm subpleural nodule in the periphery of the left lower lobe is nonspecific, but statistically likely a subpleural lymph node. No follow-up needed if patient is low-risk. Non-contrast chest CT can be considered in 12 months if patient is high-risk. This recommendation follows the consensus statement: Guidelines for Management of Incidental Pulmonary Nodules Detected on CT Images: From the Fleischner Society 2017; Radiology 2017; 284:228-243. 6. Additional incidental findings, as above. Electronically Signed: By: Trudie Reed M.D. On: 09/01/2016 14:38   Dg Chest Port 1 View  Result Date: 08/26/2016 CLINICAL DATA:  65 year old male with a history of shortness of breath EXAM: PORTABLE CHEST 1 VIEW COMPARISON:  08/25/2016, CT chest 05/01/2016 FINDINGS: Compare to the prior plain film there is unchanged size of the cardiac silhouette. Calcifications of the aortic arch. Interval placement of right upper extremity PICC with the tip appearing to terminate at the superior cavoatrial junction. Increasing opacity at the right base with thickening of the minor fissure, obscuration the right hemidiaphragm and right heart border. Interlobular septal thickening. IMPRESSION: Evidence of CHF with increasing right-sided pleural effusion/atelectasis. Unchanged  cardiac silhouette in this patient with a history of pericardial effusion imaged on prior CT 05/01/2016. Interval placement of right upper extremity PICC. Aortic atherosclerosis. Signed, Yvone Neu. Loreta Ave, DO [facility] Electronically Signed   By: Gilmer Mor D.O.   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 Dilated cardiomyopathy. Acute kidney injury. EXAM: PORTABLE CHEST 1 VIEW COMPARISON:  None. FINDINGS: Marked cardiac enlargement noted. Diffuse interstitial infiltrates consistent with mild interstitial edema. Small right pleural effusion also seen with right basilar atelectasis. IMPRESSION: Mild congestive heart failure, with small right pleural effusion and right basilar atelectasis. Electronically Signed   By: Myles Rosenthal M.D.   On: 08/25/2016 15:14   Ct Angio Chest/abd/pel For Dissection W  And/or W/wo  Result Date: 09/03/2016 CLINICAL DATA:  65 year old male with history of severe aortic stenosis. Preprocedural study prior to potential transcatheter aortic valve replacement. Recent history of pericardiocentesis. EXAM: CT ANGIOGRAPHY CHEST, ABDOMEN AND PELVIS TECHNIQUE: Multidetector CT imaging through the chest, abdomen and pelvis was performed using the standard protocol during bolus administration of intravenous contrast. Multiplanar reconstructed images and MIPs were obtained and reviewed to evaluate the vascular anatomy. CONTRAST:  80 mL of Isovue 370. COMPARISON:  Cardiac gated CTA 09/01/2016. FINDINGS: CTA CHEST FINDINGS Cardiovascular: Heart size is mildly enlarged. Small amount of residual pericardial fluid and/or thickening, decreased compared to the prior study. Pericardiocentesis catheter in place with tip terminating high in a superior pericardial recess adjacent to the aortic arch. No pericardial calcification. Severe thickening  calcification of the aortic valve, which has an appearance suggestive of a bicuspid valve. There is aortic atherosclerosis, as well as atherosclerosis of the great vessels of the mediastinum and the coronary arteries, including calcified atherosclerotic plaque in the left anterior descending and right coronary arteries. 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 Large filling defect within the left atrial appendage, concerning for potential left atrial appendage thrombus. Right upper extremity PICC with tip terminating at the superior cavoatrial junction. Mediastinum/Lymph Nodes: Several borderline enlarged and minimally enlarged mediastinal lymph nodes measuring up to 14 mm in short axis, slightly increased compared to the prior study, likely reactive. Esophagus is unremarkable in appearance. No axillary lymphadenopathy. Lungs/Pleura: Large right pleural effusion again noted, lying dependently. This is associated with near complete passive atelectasis of the right lower lobe. Trace left pleural effusion with minimal passive subsegmental atelectasis in the dependent left lower lobe. No acute consolidative airspace disease. Previously noted tiny subpleural known nodule in the anterior aspect of the left lower lobe is no longer noted, presumably a reactive subpleural lymph node on the prior examination. Partially calcified small right-sided pleural plaque is unchanged (image 63 of series 5). No other calcified pleural plaques are identified. Tiny calcified granulomas are again noted in the periphery of the right lower lobe, now with an atelectatic lung. Musculoskeletal/Soft Tissues: There are no aggressive appearing lytic or blastic lesions noted in the visualized portions of the skeleton. CTA ABDOMEN AND PELVIS FINDINGS Hepatobiliary: Liver has a shrunken appearance and nodular contour, compatible with underlying cirrhosis. No discrete cystic or solid hepatic lesions. No intra or  extrahepatic biliary ductal dilatation. Large rim calcified gallstone in the gallbladder measuring up to 3.8 cm in diameter. No current findings to suggest an acute cholecystitis at this time. Pancreas: No pancreatic mass or peripancreatic inflammatory changes. No pancreatic ductal dilatation. Pancreatic atrophy. No pancreatic or peripancreatic fluid. Spleen: Unremarkable. Adrenals/Urinary Tract: Bilateral kidneys and bilateral adrenal glands are unremarkable in appearance. No hydroureteronephrosis. In the dependent portion of the urinary bladder there is a 12 mm calculus (image 254 of series 5). Urinary bladder is otherwise unremarkable in appearance. Stomach/Bowel: Normal appearance of the stomach. No pathologic dilatation of small bowel or colon. Numerous colonic diverticulae are noted, without surrounding inflammatory changes to suggest an acute diverticulitis at this time. The appendix is not confidently identified and may be surgically absent. Regardless, there are no inflammatory changes noted adjacent to the cecum to suggest the presence of an acute appendicitis at this time. Vascular/Lymphatic: Aortic atherosclerosis with vascular findings and measurements pertinent to potential TAVR procedure, as detailed below. No aneurysm or dissection identified in the abdominal or pelvic vasculature. Celiac axis, superior mesenteric artery and inferior  mesenteric artery are all widely patent without hemodynamically significant stenosis. Single renal arteries bilaterally are widely patent. Portal vein is mildly dilated measuring 16 mm in the porta hepatis. Reproductive: Prostate gland and seminal vesicles are unremarkable in appearance. Other: Small left inguinal hernia containing a short segment of small bowel. No significant volume of ascites. No pneumoperitoneum. Musculoskeletal: There are no aggressive appearing lytic or blastic lesions noted in the visualized portions of the skeleton. VASCULAR MEASUREMENTS  PERTINENT TO TAVR: AORTA: Minimal Aortic Diameter -  18 x 14 mm Severity of Aortic Calcification -  moderate RIGHT PELVIS: Right Common Iliac Artery - Minimal Diameter - 11.0 x 9.1 mm Tortuosity - moderate Calcification - mild to moderate Right External Iliac Artery - Minimal Diameter - 8.7 x 8.9 mm Tortuosity - moderate to severe Calcification - none Right Common Femoral Artery - Minimal Diameter - 9.2 x 6.8 mm Tortuosity - mild Calcification - mild LEFT PELVIS: Left Common Iliac Artery - Minimal Diameter - 9.5 x 10.0 mm Tortuosity - severe Calcification - none Left External Iliac Artery - Minimal Diameter - 8.1 x 7.9 mm Tortuosity - moderate Calcification - none Left Common Femoral Artery - Minimal Diameter - 8.5 x 6.3 mm Tortuosity - mild Calcification - mild Review of the MIP images confirms the above findings. IMPRESSION: 1. Vascular findings and measurements pertinent to potential TAVR procedure, as detailed above. This patient appears to have suitable pelvic arterial access, however, this access is most optimal on the right side secondary to the lesser degree of tortuosity. Additionally, there is a small left inguinal hernia containing a short segment of small bowel which comes in close proximity to the left common femoral artery. 2. Severe thickening calcification of what appears to be a bicuspid aortic valve, compatible with the reported clinical history of severe aortic stenosis. 3. Interval pericardiocentesis with persistent indwelling pericardiocentesis catheter, with significant decreased size of what is now only a small amount of residual pericardial fluid and/or thickening. 4. Large filling defect in the tip of the left atrial appendage, concerning for potential left atrial appendage thrombus (although this could simply reflect pseudo thrombus secondary to contrast bolus timing). Correlation with TEE is recommended, if not already performed. 5. Aortic atherosclerosis, in addition to 2 vessel coronary  artery disease. Please note that although the presence of coronary artery calcium documents the presence of coronary artery disease, the severity of this disease and any potential stenosis cannot be assessed on this non-gated CT examination. Assessment for potential risk factor modification, dietary therapy or pharmacologic therapy may be warranted, if clinically indicated. 6. Persistent large right-sided pleural effusion with extensive passive atelectasis in the right lower lobe. Trace left pleural effusion with minimal dependent passive atelectasis in the left lower lobe. 7. Cholelithiasis without evidence of acute cholecystitis. 8. Colonic diverticulosis without evidence of acute diverticulitis at this time. 9. Morphologic changes in the liver compatible with underlying cirrhosis. 10. Additional incidental findings, as above. Electronically Signed   By: Trudie Reedaniel  Entrikin M.D.   On: 09/03/2016 12:24    PHYSICAL EXAM General: NAD Neck: JVP 7, no thyromegaly or thyroid nodule.  Lungs: Clear  CV: Nondisplaced PMI.  Heart mildly tachy, irregular S1/S2, no S3/S4, 3/6 crescendo-decrescendo murmur RUSB. Trace-1+ ankle edema.  Pericardial drain in place Abdomen: Soft, NT, ND, no HSM. No bruits or masses. +BS  Neurologic: Alert and oriented x 3.  Psych: Normal affect. Extremities: No clubbing or cyanosis. RUE PICC  TELEMETRY: Reviewed, A fib 90s   ASSESSMENT AND  PLAN: 65 y/o ?with a h/o severe AS, LV dysfxn, HTN, noncompliance, thyroid nodules, pericardial effusion, cholelithiasis, and persistent AF on eliquis, who presentedto clinic on 08/23/16 with volume overload and was admitted for further work up of AS and CHF.  1. Acute on chronic systolic CHF: EF 16-10% in setting of severe aortic stenosis.  May be due to severe aortic stenosis given lack of flow-limiting CAD.  This admission, he was initially hypotensive with SBP in 80s though BP has improved to 100s-110s range.  He was also markedly volume  overloaded. PICC was placed, low output confirmed by co-ox 47%.  Milrinone 0.25 started then increased to 0.375, co-ox marginal at 55% today.  Creatinine 1.4 today.  CVP 8.  Now on po Lasix - Continue Lasix 60 mg po bid. - Continue milrinone 0.375 mcg/kg/min, will continue through TAVR then try to wean.   - Continue spiro 25 mg daily.   - Continue digoxin, level ok this week.  2. Aortic stenosis: Severe AS.  Will need AVR, best suited for TAVR.   - Drs. McAlhany and Cornelius Moras have seen. Has had CTs. - Concern for LAA thrombus on CT, TEE 10/23 confirmed amorphous material in LAA that appears to be developing thrombus.  He is on heparin gtt and therapeutic, prior to that was on apixaban.  I have had extensive discussions with Drs Clifton James and Laneta Simmers as well as the patient.  Dr Laneta Simmers spoke with the patient last night.  He will be at higher stroke risk with TAVR because of the LA appendage developing thrombus.  He understands this.  However, we think he would be unlikely to survive a month-long delay to try warfarin (he is currently dependent on milrinone with marginal co-ox), and based on his course so far, I am not sure that the LA appendage would fully clear (has been anticoagulated long-term, heparin in hospital and apixaban prior to hospitalization).  Mr Ramirez wishes to proceed with TAVR, and given the circumstances we have agreed that this is reasonable though higher risk.  3. AKI on CKD stage III: Baseline creatinine 1.7-1.8.  Stable at 1.4 today.  - Continue to follow closely with po Lasix and contrast exposure with CTs.  4. Atrial fibrillation: Persistent, with RVR this admission.  He has been in atrial fibrillation at least since 6/17.  No prior DCCV.  Not sure how long prior to 6/17 he was in atrial fibrillation.  - Continue amiodarone gtt for now for rate control, HR now in 90s at rest with milrinone running.  - Continue heparin gtt (was on apixaban at home).  - Continue digoxin. - Will not be able  to do DCCV at this point with atrial thrombus.  5. Pericardial effusion: Moderate to large, no tamponade.  s/p pericardiocentesis with Dr Clifton James 10/20. 775 ccs fluid out. Fluid is transudative.  Pericardial drain removed today.   6. Cirrhosis: Noted on prior abdominal CT, LFTs improved with diuresis.  7. CAD:  LHC with moderate coronary disease, nothing flow-limiting.  - Continue statin and 81 mg aspirin.   35 minutes critical care time.   Marca Ancona MD 09/05/2016 7:35 AM

## 2016-09-05 NOTE — Progress Notes (Signed)
  Echocardiogram Echocardiogram Transesophageal has been performed.  Lucas Munoz 09/05/2016, 12:45 PM

## 2016-09-05 NOTE — Interval H&P Note (Signed)
History and Physical Interval Note:  09/05/2016 10:27 AM  Lucas Munoz  has presented today for surgery, with the diagnosis of SEVERE AS  The various methods of treatment have been discussed with the patient and family. After consideration of risks, benefits and other options for treatment, the patient has consented to  Procedure(s): TRANSCATHETER AORTIC VALVE REPLACEMENT, TRANSFEMORAL (N/A) TRANSESOPHAGEAL ECHOCARDIOGRAM (TEE) (N/A) as a surgical intervention .  The patient's history has been reviewed, patient examined, no change in status, stable for surgery.  I have reviewed the patient's chart and labs.  Questions were answered to the patient's satisfaction.     Verne Carrow

## 2016-09-05 NOTE — H&P (View-Only) (Signed)
Patient ID: Lucas Munoz, male   DOB: 05/01/1951, 65 y.o.   MRN: [id number] Cardiothoracic Surgery  I have reviewed his TEE and there is no well-formed thrombus in the LAA. There is amorphous shadowing which could be sluggish flow or developing thrombus. He has been on heparin for several weeks so I would not expect developing thrombus. His is quite tenuous on Milrinone 0.375 mcg with very poor LV function and severe AS, stage IV CHF. I think it is best to proceed with TAVR tomorrow. If we wait and send him home on milrinone and anticoagulation he will likely expire before his valve can be replaced and TAVR is his only chance of longer term survival. His risk of stroke is increased and I have discussed that with him. He completely understands and wishes to proceed with TAVR tomorrow.  

## 2016-09-06 ENCOUNTER — Inpatient Hospital Stay (HOSPITAL_COMMUNITY): Payer: Commercial Managed Care - HMO

## 2016-09-06 ENCOUNTER — Encounter (HOSPITAL_COMMUNITY): Payer: Self-pay | Admitting: Cardiovascular Disease

## 2016-09-06 DIAGNOSIS — Z954 Presence of other heart-valve replacement: Secondary | ICD-10-CM

## 2016-09-06 DIAGNOSIS — I350 Nonrheumatic aortic (valve) stenosis: Secondary | ICD-10-CM

## 2016-09-06 LAB — CBC
HEMATOCRIT: 35.5 % — AB (ref 39.0–52.0)
HEMOGLOBIN: 11.2 g/dL — AB (ref 13.0–17.0)
MCH: 25.7 pg — ABNORMAL LOW (ref 26.0–34.0)
MCHC: 31.5 g/dL (ref 30.0–36.0)
MCV: 81.4 fL (ref 78.0–100.0)
Platelets: 135 10*3/uL — ABNORMAL LOW (ref 150–400)
RBC: 4.36 MIL/uL (ref 4.22–5.81)
RDW: 17.8 % — ABNORMAL HIGH (ref 11.5–15.5)
WBC: 7.3 10*3/uL (ref 4.0–10.5)

## 2016-09-06 LAB — GLUCOSE, CAPILLARY
GLUCOSE-CAPILLARY: 95 mg/dL (ref 65–99)
GLUCOSE-CAPILLARY: 99 mg/dL (ref 65–99)
GLUCOSE-CAPILLARY: 197 mg/dL — AB (ref 65–99)
Glucose-Capillary: 118 mg/dL — ABNORMAL HIGH (ref 65–99)
Glucose-Capillary: 161 mg/dL — ABNORMAL HIGH (ref 65–99)

## 2016-09-06 LAB — HEPARIN LEVEL (UNFRACTIONATED)
HEPARIN UNFRACTIONATED: 0.46 [IU]/mL (ref 0.30–0.70)
Heparin Unfractionated: 0.59 IU/mL (ref 0.30–0.70)

## 2016-09-06 LAB — CULTURE, BODY FLUID-BOTTLE

## 2016-09-06 LAB — ECHOCARDIOGRAM LIMITED
Height: 68 in
Weight: 2751.34 oz

## 2016-09-06 LAB — BASIC METABOLIC PANEL
ANION GAP: 9 (ref 5–15)
BUN: 12 mg/dL (ref 6–20)
CALCIUM: 8.2 mg/dL — AB (ref 8.9–10.3)
CHLORIDE: 99 mmol/L — AB (ref 101–111)
CO2: 27 mmol/L (ref 22–32)
Creatinine, Ser: 1.22 mg/dL (ref 0.61–1.24)
GFR calc non Af Amer: 60 mL/min — ABNORMAL LOW (ref >60)
Glucose, Bld: 98 mg/dL (ref 65–99)
Potassium: 3.5 mmol/L (ref 3.5–5.1)
Sodium: 135 mmol/L (ref 135–145)

## 2016-09-06 LAB — CULTURE, BODY FLUID W GRAM STAIN -BOTTLE: Culture: NO GROWTH

## 2016-09-06 LAB — COOXEMETRY PANEL
CARBOXYHEMOGLOBIN: 1.7 % — AB (ref 0.5–1.5)
METHEMOGLOBIN: 0.8 % (ref 0.0–1.5)
O2 SAT: 69.2 %
Total hemoglobin: 14.1 g/dL (ref 12.0–16.0)

## 2016-09-06 LAB — CARBOXYHEMOGLOBIN - COOX: CARBOXYHEMOGLOBIN: 1.7 % — AB (ref 0.5–1.5)

## 2016-09-06 LAB — MAGNESIUM: Magnesium: 1.7 mg/dL (ref 1.7–2.4)

## 2016-09-06 MED ORDER — LISINOPRIL 2.5 MG PO TABS
2.5000 mg | ORAL_TABLET | Freq: Every day | ORAL | Status: DC
  Administered 2016-09-06: 2.5 mg via ORAL
  Filled 2016-09-06: qty 1

## 2016-09-06 MED ORDER — WARFARIN SODIUM 7.5 MG PO TABS
7.5000 mg | ORAL_TABLET | Freq: Once | ORAL | Status: AC
Start: 2016-09-06 — End: 2016-09-06
  Administered 2016-09-06: 7.5 mg via ORAL
  Filled 2016-09-06: qty 1

## 2016-09-06 MED ORDER — SODIUM CHLORIDE 0.9 % IV BOLUS (SEPSIS)
250.0000 mL | Freq: Once | INTRAVENOUS | Status: AC
  Administered 2016-09-06: 250 mL via INTRAVENOUS

## 2016-09-06 MED ORDER — TRAZODONE HCL 50 MG PO TABS
50.0000 mg | ORAL_TABLET | Freq: Every evening | ORAL | Status: DC | PRN
  Administered 2016-09-15: 50 mg via ORAL
  Filled 2016-09-06: qty 1

## 2016-09-06 MED ORDER — POTASSIUM CHLORIDE 10 MEQ/50ML IV SOLN
10.0000 meq | INTRAVENOUS | Status: AC
  Administered 2016-09-06 (×3): 10 meq via INTRAVENOUS

## 2016-09-06 MED ORDER — SODIUM CHLORIDE 0.9 % IV SOLN
10.0000 ug/min | INTRAVENOUS | Status: DC
  Administered 2016-09-06: 10 ug/min via INTRAVENOUS
  Administered 2016-09-07: 20 ug/min via INTRAVENOUS
  Filled 2016-09-06 (×2): qty 4

## 2016-09-06 MED ORDER — WARFARIN - PHARMACIST DOSING INPATIENT: Freq: Every day | Status: DC

## 2016-09-06 NOTE — Progress Notes (Signed)
1 Day Post-Op Procedure(s) (LRB): TRANSCATHETER AORTIC VALVE REPLACEMENT, TRANSFEMORAL (N/A) TRANSESOPHAGEAL ECHOCARDIOGRAM (TEE) (N/A) CHEST TUBE INSERTION (Right) Subjective: No specific complaints. Feels better  Objective: Vital signs in last 24 hours: Temp:  [95.7 F (35.4 C)-100.2 F (37.9 C)] 99 F (37.2 C) (10/25 0700) Pulse Rate:  [37-101] 74 (10/25 0700) Cardiac Rhythm: Atrial fibrillation (10/25 0400) Resp:  [8-46] 37 (10/25 0700) BP: (81-115)/(50-76) 112/69 (10/25 0700) SpO2:  [93 %-100 %] 97 % (10/25 0700) Arterial Line BP: (77-152)/(40-71) 107/54 (10/25 0700) FiO2 (%):  [40 %-50 %] 40 % (10/24 1531) Weight:  [78 kg (171 lb 15.3 oz)] 78 kg (171 lb 15.3 oz) (10/25 0600)  Hemodynamic parameters for last 24 hours: PAP: (27-47)/(12-29) 33/19 CVP:  [1 mmHg-11 mmHg] 4 mmHg CO:  [2.8 L/min-5.1 L/min] 5.1 L/min CI:  [1.6 L/min/m2-2.9 L/min/m2] 2.9 L/min/m2  Intake/Output from previous day: 10/24 0701 - 10/25 0700 In: 2418.7 [I.V.:1668.7; IV Piggyback:750] Out: 4755 [Urine:3045; Drains:20; Blood:100; Chest Tube:1590] Intake/Output this shift: No intake/output data recorded.  General appearance: alert and cooperative Neurologic: intact Heart: irregularly irregular rhythm Lungs: diminished breath sounds bibasilar Extremities: edema minimal Wound: groin access sites ok  Lab Results:  Recent Labs  09/05/16 1400 09/06/16 0357  WBC 8.2 7.3  HGB 11.6* 11.2*  HCT 36.7* 35.5*  PLT 134* 135*   BMET:  Recent Labs  09/05/16 0315 09/05/16 1345 09/06/16 0357  NA 133* 139 135  K 4.0 3.2* 3.5  CL 97*  --  99*  CO2 29  --  27  GLUCOSE 138* 103* 98  BUN 16  --  12  CREATININE 1.40*  --  1.22  CALCIUM 8.3*  --  8.2*    PT/INR:  Recent Labs  09/05/16 1400  LABPROT 15.4*  INR 1.21   ABG    Component Value Date/Time   PHART 7.420 09/05/2016 1350   HCO3 28.4 (H) 09/05/2016 1350   TCO2 30 09/05/2016 1350   O2SAT 69.2 09/06/2016 0610   CBG (last 3)    Recent Labs  09/05/16 1953 09/05/16 2320 09/06/16 0352  GLUCAP 95 117* 95   CXR: bibasilar atelectasis  Assessment/Plan: S/P Procedure(s) (LRB): TRANSCATHETER AORTIC VALVE REPLACEMENT, TRANSFEMORAL (N/A) TRANSESOPHAGEAL ECHOCARDIOGRAM (TEE) (N/A) CHEST TUBE INSERTION (Right)  He has been hemodynamically stable on milrinone 0.375 with CI 2.9.  Chronic atrial fib with rate creeping up to 100. He is on digoxin and amio had been ordered postop but held due to bradycardia in the 50's initially postop.  He is on heparin drip and can be started on coumadin per heart failure team. Will need coumadin and ASA long term.  Will remove chest tube. Work on [employer] Remove foley, arterial line and swan.  2D echo today.   LOS: 13 days    [name] 09/06/2016

## 2016-09-06 NOTE — Progress Notes (Signed)
  Echocardiogram 2D Echocardiogram limited has been performed.  Lucas Munoz 09/06/2016, 9:43 AM

## 2016-09-06 NOTE — Progress Notes (Signed)
ANTICOAGULATION CONSULT NOTE - Follow Up Consult  Pharmacy Consult for Heparin  Indication: atrial fibrillation and atrial thrombus  Allergies  Allergen Reactions  . Potassium-Containing Compounds Other (See Comments)    Upset stomach    Patient Measurements: Height: 5\' 8"  (172.7 cm) Weight: 179 lb 1.6 oz (81.2 kg) IBW/kg (Calculated) : 68.4  Vital Signs: Temp: 99.7 F (37.6 C) (10/25 0115) Temp Source: Core (Comment) (10/25 0000) BP: 100/58 (10/25 0115) Pulse Rate: 59 (10/25 0115)  Labs:  Recent Labs  09/03/16 0528 09/04/16 0427 09/04/16 1800 09/05/16 0315 09/05/16 1345 09/05/16 1400 09/06/16 0043  HGB 12.5* 11.8*  --  11.4* 12.9* 11.6*  --   HCT 38.9* 37.4*  --  36.4* 38.0* 36.7*  --   PLT 124* 134*  --  165  --  134*  --   APTT  --   --   --   --   --  30  --   LABPROT  --   --   --   --   --  15.4*  --   INR  --   --   --   --   --  1.21  --   HEPARINUNFRC 0.26* 0.25* 0.16* 0.55  --   --  0.46  CREATININE 1.59* 1.43*  --  1.40*  --   --   --     Estimated Creatinine Clearance: 49.5 mL/min (by C-G formula based on SCr of 1.4 mg/dL (H)).   Assessment: 65yom on eliquis pta for afib, transitioned to heparin for pericardiocentesis on 10/20 with drain placement. Drain removed 10/24 and he is now s/p TAVR. Heparin resumed evening of 10/24 at previously therapeutic rate  Goal of Therapy:  Heparin level 0.3-0.7 units/ml Monitor platelets by anticoagulation protocol: Yes   Plan:  -Cont heparin at 1900 units/hr -1000 HL  Wilman Tucker 09/06/2016,1:32 AM

## 2016-09-06 NOTE — Care Management Important Message (Signed)
Important Message  Patient Details  Name: Osborne Copus MRN: [id number] Date of Birth: 1951/05/09   Medicare Important Message Given:  Yes    Shemeca Lukasik 09/06/2016, 11:11 AM

## 2016-09-06 NOTE — Progress Notes (Signed)
Pt BP not responding to fluid bolus, BP 54/40 with a MAP of 46; pt states he is asymptomatic, paged Dr. Antoine Poche, verbal orders received for another 250 cc bolus and to initiate levophed @ 10-20 mcg/min. Will implement orders and continue to monitor closely.  Herma Ard, RN

## 2016-09-06 NOTE — Progress Notes (Addendum)
Patient ID: Lucas Munoz, male   DOB: Apr 26, 1951, 65 y.o.   MRN: [id number]    SUBJECTIVE:   PICC placed , initial co-ox on 10/14 47%.  He was started on milrinone. Milrinone increased to 0.375 mcg 08/29/16 for low mixed venous sat on RHC (CI 1.9).  Underwent pericardiocentesis on 10/20 with 775cc out. Fluid transudative. Drained another 225 cc yesterday  TEE 10/23 confirmed amorphous material in LA appendage, appears to be forming thrombus.  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 Amio held post op with post op bradycardia. Intra-op TEE with LAA thrombus with dense smoke throughout.   Today's CO-OX 69.2%. Creatinine stable to improved 1.22 Feeling good overall. Denies SOB. CVP ~10   RHC/LHC (08/29/16) Left Main  Mild ostial narrowing.  Left Anterior Descending  30% proximal LAD stenosis. 60% distal LAD stenosis. Moderate D1 without significant disease. Small to moderate D2 with long 70% ostial stenosis.  Left Circumflex  50% proximal LCx stenosis at OM1. Moderate OM1 with 30% ostial stenosis.  Right Coronary Artery  40% proximal and 40% mid RCA stenosis.   RA mean 19 RV 65/15 PA 69/34, mean 51 PCWP mean 43 (not sure we got an accurate PCWP).  AO 106/87 Oxygen saturations: PA 46% AO 92% Cardiac Output (Fick) 3.8  Cardiac Index (Fick) 1.9  Echo (10/14): EF 15%, severe AS, moderately decreased RV systolic function, PASP 65 mmHg, moderate to large pericardial effusion without tamponade.   Scheduled Meds: . acetaminophen  1,000 mg Oral Q6H   Or  . acetaminophen (TYLENOL) oral liquid 160 mg/5 mL  1,000 mg Per Tube Q6H  . acetaminophen (TYLENOL) oral liquid 160 mg/5 mL  650 mg Per Tube Once   Or  . acetaminophen  650 mg Rectal Once  . aspirin  81 mg Oral Daily  . atorvastatin  40 mg Oral q1800  . cefUROXime (ZINACEF)  IV  1.5 g Intravenous Q12H  . digoxin  0.125 mg Oral Daily  . famotidine (PEPCID) IV  20 mg Intravenous Q12H  . furosemide  60 mg Oral BID  . mouth rinse  15 mL Mouth Rinse BID  .  [START ON 09/07/2016] pantoprazole  40 mg Oral Daily  . polyethylene glycol  17 g Oral Daily  . sodium chloride flush  10-40 mL Intracatheter Q12H  . sodium chloride flush  3 mL Intravenous Q12H  . spironolactone  25 mg Oral Daily   Continuous Infusions: . amiodarone Stopped (09/05/16 1400)  . heparin 1,900 Units/hr (09/06/16 0700)  . milrinone 0.375 mcg/kg/min (09/06/16 0700)  . nitroGLYCERIN Stopped (09/05/16 1330)   PRN Meds:.sodium chloride, acetaminophen, albumin human, alum & mag hydroxide-simeth, docusate sodium, lactulose, morphine injection, ondansetron (ZOFRAN) IV, ondansetron (ZOFRAN) IV, oxyCODONE, oxyCODONE-acetaminophen, sodium chloride flush, sodium chloride flush, traMADol   Vitals:   09/06/16 0400 09/06/16 0500 09/06/16 0600 09/06/16 0700  BP: 107/63 (!) 94/58 (!) 89/59 112/69  Pulse: 65 79 72 74  Resp: (!) 28 (!) 30 (!) 26 (!) 37  Temp: 99 F (37.2 C) 99.1 F (37.3 C) 99 F (37.2 C) 99 F (37.2 C)  TempSrc: Core (Comment)     SpO2: 96% 97% 97% 97%  Weight:   171 lb 15.3 oz (78 kg)   Height:   5\' 8"  (1.727 m)     Intake/Output Summary (Last 24 hours) at 09/06/16 0944 Last data filed at 09/06/16 0700  Gross per 24 hour  Intake          2418.68 ml  Output  4535 ml  Net         -2116.32 ml    LABS: Basic Metabolic Panel:  Recent Labs  16/10/96 0315 09/05/16 1345 09/06/16 0357  NA 133* 139 135  K 4.0 3.2* 3.5  CL 97*  --  99*  CO2 29  --  27  GLUCOSE 138* 103* 98  BUN 16  --  12  CREATININE 1.40*  --  1.22  CALCIUM 8.3*  --  8.2*  MG  --   --  1.7   Liver Function Tests: No results for input(s): AST, ALT, ALKPHOS, BILITOT, PROT, ALBUMIN in the last 72 hours. No results for input(s): LIPASE, AMYLASE in the last 72 hours. CBC:  Recent Labs  09/05/16 1400 09/06/16 0357  WBC 8.2 7.3  HGB 11.6* 11.2*  HCT 36.7* 35.5*  MCV 82.1 81.4  PLT 134* 135*   Cardiac Enzymes: No results for input(s): CKTOTAL, CKMB, CKMBINDEX, TROPONINI  in the last 72 hours. BNP: Invalid input(s): POCBNP D-Dimer: No results for input(s): DDIMER in the last 72 hours. Hemoglobin A1C: No results for input(s): HGBA1C in the last 72 hours. Fasting Lipid Panel: No results for input(s): CHOL, HDL, LDLCALC, TRIG, CHOLHDL, LDLDIRECT in the last 72 hours. Thyroid Function Tests: No results for input(s): TSH, T4TOTAL, T3FREE, THYROIDAB in the last 72 hours.  Invalid input(s): FREET3 Anemia Panel: No results for input(s): VITAMINB12, FOLATE, FERRITIN, TIBC, IRON, RETICCTPCT in the last 72 hours.  RADIOLOGY: Ct Cardiac Morph/pulm Vein W/cm&w/o Ca Score  Addendum Date: 09/01/2016   ADDENDUM REPORT: 09/01/2016 17:18 CLINICAL DATA:  Aortic stenosis EXAM: Cardiac TAVR CT TECHNIQUE: The patient was scanned on a Philips 256 scanner. A 120 kV retrospective scan was triggered in the descending thoracic aorta at 111 HU's. Gantry rotation speed was 270 msecs and collimation was .9 mm. No beta blockade or nitro were given. The 3D data set was reconstructed in 5% intervals of the R-R cycle. Systolic and diastolic phases were analyzed on a dedicated work station using MPR, MIP and VRT modes. The patient received 80 cc of contrast. 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 Heavily calcified Aorta:  Aortic root dilated Sinotubular Junction:  32 mm Ascending Thoracic Aorta:  43 mm Aortic Arch:  30 mm Descending Thoracic Aorta:  26 mm Sinus of Valsalva Measurements: Non-coronary:  33 mm Right -coronary:  31 mm Left -coronary:  32 mm Coronary Artery Height above Annulus: Left Main:  19 mm above annulus Right Coronary:  10 mm above annulus Virtual Basal Annulus Measurements: Maximum/Minimum Diameter:  27.1 x 20.3 mm Perimeter:  78 mm Area:  442 mm2 Coronary Arteries:  Sufficient height above annulus for deployment Optimum Fluoroscopic Angle for Delivery:  LAO 29 degrees IMPRESSION: 1) functionally bicuspid AV heavily calcified with annulus  suitable for 26 mm Sapien 3 valve 2) Moderate aortic root enlargement 4.3 cm 3) Large pericardial effusion patient to have pericardiocentesis post CT 4) Cannot r/o LAA thrombus suggest TEE correlation 5) Optimum angiographic angle for deployment LAO 29 degrees 6) Coronary heights sufficient for deployment Charlton Haws Electronically Signed   By: Charlton Haws M.D.   On: 09/01/2016 17:18   Result Date: 09/01/2016 EXAM: OVER-READ INTERPRETATION  CT CHEST The following report is an over-read performed by radiologist Dr. Royal Piedra [facility] on 09/01/2016. This over-read does not include interpretation of cardiac or coronary anatomy or pathology. The coronary calcium score/coronary CTA interpretation by the cardiologist is attached. COMPARISON:  Chest  CT 05/01/2016. FINDINGS: Large pericardial effusion. No pericardial calcification. Aortic atherosclerosis with aneurysmal dilatation of the ascending thoracic aorta which measures up to 4.8 cm in diameter (unchanged). Large right-sided pleural effusion layering dependently. Extensive passive atelectasis in the right lower lobe. Tiny calcified granuloma in the periphery of the right lower lobe. 4 mm subpleural noncalcified pulmonary nodule in the left lower lobe abutting the major fissure (image 71 of series 512), unchanged compared to prior study 05/01/2016, favored to represent a subpleural lymph node. No other suspicious appearing pulmonary nodules or masses are noted in the visualized portions of the thorax. Partially calcified pleural plaque in the right hemithorax (image 31 of series 513) again noted. No calcified pleural plaques in the left hemithorax. Visualized portions of the upper abdomen demonstrate a nodular contour of the liver, suggestive of underlying cirrhosis. No aggressive appearing lytic or blastic lesions are noted in the visualized portions of the skeleton. Right upper extremity PICC with tip terminating in the distal superior  vena cava. IMPRESSION: 1. Interval increased size of what is now a large pericardial effusion. No associated pericardial calcification. 2. Enlargement of a large layering right-sided pleural effusion. This is associated with extensive passive atelectasis in the right lower lobe. 3. Aortic atherosclerosis, with similar aneurysmal dilatation of the ascending thoracic aorta which measures up to 4.8 cm in diameter. 4. Morphologic changes in the liver indicative of cirrhosis, as above. 5. 4 mm subpleural nodule in the periphery of the left lower lobe is nonspecific, but statistically likely a subpleural lymph node. No follow-up needed if patient is low-risk. Non-contrast chest CT can be considered in 12 months if patient is high-risk. This recommendation follows the consensus statement: Guidelines for Management of Incidental Pulmonary Nodules Detected on CT Images: From the Fleischner Society 2017; Radiology 2017; 284:228-243. 6. Additional incidental findings, as above. Electronically Signed: By: Trudie Reed M.D. On: 09/01/2016 14:38   Dg Chest Port 1 View  Result Date: 09/06/2016 CLINICAL DATA:  Cardiac surgery EXAM: PORTABLE CHEST 1 VIEW COMPARISON:  Yesterday FINDINGS: Right upper extremity PICC, right chest tube, right jugular introducer, right Swan-Ganz catheter are stable. There is no pneumothorax. Small right pleural effusion is stable. Vascular congestion and mild edema are worse. Lower lung volumes. Cardiac silhouette remains markedly enlarged. IMPRESSION: Worsening vascular congestion and edema. No pneumothorax. Electronically Signed   By: Jolaine Click M.D.   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 Postoperative image. EXAM: PORTABLE CHEST 1 VIEW COMPARISON:  CT chest 09/01/2016 and single view of the chest 08/26/2016. FINDINGS: Endotracheal tube is in place with tip in good position at the level of the  clavicular heads. Right IJ approach Swan-Ganz catheter tip is in the proximal right main pulmonary artery. Tip of right PICC projects in the lower superior vena cava. NG tube courses into the stomach. Right chest tube is identified. There is a small right pleural effusion and basilar atelectasis, improved since the prior chest film. The lungs are otherwise clear. The cardiopericardial silhouette is enlarged. No pneumothorax. IMPRESSION: Support tubes and lines projecting good position. Negative for pneumothorax. Decreased small right pleural effusion and basilar atelectasis. Enlarged cardiopericardial silhouette consistent with cardiomegaly and pericardial effusion as seen on CT scan. Electronically Signed   By: Drusilla Kanner M.D.   On: 09/05/2016 13:56   Dg Chest Port 1 View  Result Date: 08/26/2016 CLINICAL DATA:  65 year old male with a history  of shortness of breath EXAM: PORTABLE CHEST 1 VIEW COMPARISON:  08/25/2016, CT chest 05/01/2016 FINDINGS: Compare to the prior plain film there is unchanged size of the cardiac silhouette. Calcifications of the aortic arch. Interval placement of right upper extremity PICC with the tip appearing to terminate at the superior cavoatrial junction. Increasing opacity at the right base with thickening of the minor fissure, obscuration the right hemidiaphragm and right heart border. Interlobular septal thickening. IMPRESSION: Evidence of CHF with increasing right-sided pleural effusion/atelectasis. Unchanged cardiac silhouette in this patient with a history of pericardial effusion imaged on prior CT 05/01/2016. Interval placement of right upper extremity PICC. Aortic atherosclerosis. Signed, Yvone NeuJaime S. Loreta AveWagner, DO [facility] Electronically Signed   By: Gilmer MorJaime  Wagner D.O.   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  Dilated cardiomyopathy. Acute kidney injury. EXAM: PORTABLE CHEST 1 VIEW COMPARISON:  None. FINDINGS: Marked cardiac enlargement noted. Diffuse interstitial infiltrates consistent with mild interstitial edema. Small right pleural effusion also seen with right basilar atelectasis. IMPRESSION: Mild congestive heart failure, with small right pleural effusion and right basilar atelectasis. Electronically Signed   By: Myles RosenthalJohn  Stahl M.D.   On: 08/25/2016 15:14   Ct Angio Chest/abd/pel For Dissection W And/or W/wo  Result Date: 09/03/2016 CLINICAL DATA:  65 year old male with history of severe aortic stenosis. Preprocedural study prior to potential transcatheter aortic valve replacement. Recent history of pericardiocentesis. EXAM: CT ANGIOGRAPHY CHEST, ABDOMEN AND PELVIS TECHNIQUE: Multidetector CT imaging through the chest, abdomen and pelvis was performed using the standard protocol during bolus administration of intravenous contrast. Multiplanar reconstructed images and MIPs were obtained and reviewed to evaluate the vascular anatomy. CONTRAST:  80 mL of Isovue 370. COMPARISON:  Cardiac gated CTA 09/01/2016. FINDINGS: CTA CHEST FINDINGS Cardiovascular: Heart size is mildly enlarged. Small amount of residual pericardial fluid and/or thickening, decreased compared to the prior study. Pericardiocentesis catheter in place with tip terminating high in a superior pericardial recess adjacent to the aortic arch. No pericardial calcification. Severe thickening calcification of the aortic valve, which has an appearance suggestive of a bicuspid valve. There is aortic atherosclerosis, as well as atherosclerosis of the great vessels of the mediastinum and the coronary arteries, including calcified atherosclerotic plaque in the left anterior descending and right coronary arteries. 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 Large filling defect within the left atrial appendage, concerning for  potential left atrial appendage thrombus. Right upper extremity PICC with tip terminating at the superior cavoatrial junction. Mediastinum/Lymph Nodes: Several borderline enlarged and minimally enlarged mediastinal lymph nodes measuring up to 14 mm in short axis, slightly increased compared to the prior study, likely reactive. Esophagus is unremarkable in appearance. No axillary lymphadenopathy. Lungs/Pleura: Large right pleural effusion again noted, lying dependently. This is associated with near complete passive atelectasis of the right lower lobe. Trace left pleural effusion with minimal passive subsegmental atelectasis in the dependent left lower lobe. No acute consolidative airspace disease. Previously noted tiny subpleural known nodule in the anterior aspect of the left lower lobe is no longer noted, presumably a reactive subpleural lymph node on the prior examination. Partially calcified small right-sided pleural plaque is unchanged (image 63 of series 5). No other calcified pleural plaques are identified. Tiny calcified granulomas are again noted in the periphery of the right lower lobe, now with an atelectatic lung. Musculoskeletal/Soft Tissues: There are no  aggressive appearing lytic or blastic lesions noted in the visualized portions of the skeleton. CTA ABDOMEN AND PELVIS FINDINGS Hepatobiliary: Liver has a shrunken appearance and nodular contour, compatible with underlying cirrhosis. No discrete cystic or solid hepatic lesions. No intra or extrahepatic biliary ductal dilatation. Large rim calcified gallstone in the gallbladder measuring up to 3.8 cm in diameter. No current findings to suggest an acute cholecystitis at this time. Pancreas: No pancreatic mass or peripancreatic inflammatory changes. No pancreatic ductal dilatation. Pancreatic atrophy. No pancreatic or peripancreatic fluid. Spleen: Unremarkable. Adrenals/Urinary Tract: Bilateral kidneys and bilateral adrenal glands are unremarkable in  appearance. No hydroureteronephrosis. In the dependent portion of the urinary bladder there is a 12 mm calculus (image 254 of series 5). Urinary bladder is otherwise unremarkable in appearance. Stomach/Bowel: Normal appearance of the stomach. No pathologic dilatation of small bowel or colon. Numerous colonic diverticulae are noted, without surrounding inflammatory changes to suggest an acute diverticulitis at this time. The appendix is not confidently identified and may be surgically absent. Regardless, there are no inflammatory changes noted adjacent to the cecum to suggest the presence of an acute appendicitis at this time. Vascular/Lymphatic: Aortic atherosclerosis with vascular findings and measurements pertinent to potential TAVR procedure, as detailed below. No aneurysm or dissection identified in the abdominal or pelvic vasculature. Celiac axis, superior mesenteric artery and inferior mesenteric artery are all widely patent without hemodynamically significant stenosis. Single renal arteries bilaterally are widely patent. Portal vein is mildly dilated measuring 16 mm in the porta hepatis. Reproductive: Prostate gland and seminal vesicles are unremarkable in appearance. Other: Small left inguinal hernia containing a short segment of small bowel. No significant volume of ascites. No pneumoperitoneum. Musculoskeletal: There are no aggressive appearing lytic or blastic lesions noted in the visualized portions of the skeleton. VASCULAR MEASUREMENTS PERTINENT TO TAVR: AORTA: Minimal Aortic Diameter -  18 x 14 mm Severity of Aortic Calcification -  moderate RIGHT PELVIS: Right Common Iliac Artery - Minimal Diameter - 11.0 x 9.1 mm Tortuosity - moderate Calcification - mild to moderate Right External Iliac Artery - Minimal Diameter - 8.7 x 8.9 mm Tortuosity - moderate to severe Calcification - none Right Common Femoral Artery - Minimal Diameter - 9.2 x 6.8 mm Tortuosity - mild Calcification - mild LEFT PELVIS: Left  Common Iliac Artery - Minimal Diameter - 9.5 x 10.0 mm Tortuosity - severe Calcification - none Left External Iliac Artery - Minimal Diameter - 8.1 x 7.9 mm Tortuosity - moderate Calcification - none Left Common Femoral Artery - Minimal Diameter - 8.5 x 6.3 mm Tortuosity - mild Calcification - mild Review of the MIP images confirms the above findings. IMPRESSION: 1. Vascular findings and measurements pertinent to potential TAVR procedure, as detailed above. This patient appears to have suitable pelvic arterial access, however, this access is most optimal on the right side secondary to the lesser degree of tortuosity. Additionally, there is a small left inguinal hernia containing a short segment of small bowel which comes in close proximity to the left common femoral artery. 2. Severe thickening calcification of what appears to be a bicuspid aortic valve, compatible with the reported clinical history of severe aortic stenosis. 3. Interval pericardiocentesis with persistent indwelling pericardiocentesis catheter, with significant decreased size of what is now only a small amount of residual pericardial fluid and/or thickening. 4. Large filling defect in the tip of the left atrial appendage, concerning for potential left atrial appendage thrombus (although this could simply reflect pseudo thrombus secondary to contrast bolus  timing). Correlation with TEE is recommended, if not already performed. 5. Aortic atherosclerosis, in addition to 2 vessel coronary artery disease. Please note that although the presence of coronary artery calcium documents the presence of coronary artery disease, the severity of this disease and any potential stenosis cannot be assessed on this non-gated CT examination. Assessment for potential risk factor modification, dietary therapy or pharmacologic therapy may be warranted, if clinically indicated. 6. Persistent large right-sided pleural effusion with extensive passive atelectasis in the right  lower lobe. Trace left pleural effusion with minimal dependent passive atelectasis in the left lower lobe. 7. Cholelithiasis without evidence of acute cholecystitis. 8. Colonic diverticulosis without evidence of acute diverticulitis at this time. 9. Morphologic changes in the liver compatible with underlying cirrhosis. 10. Additional incidental findings, as above. Electronically Signed   By: Trudie Reed M.D.   On: 09/03/2016 12:24    PHYSICAL EXAM CVP ~10 cm General: NAD Neck: R neck swan in place. JVP 9-10 cm, no thyromegaly or thyroid nodule.  Lungs: CTAB, normal effort.  CV: Nondisplaced PMI.  Heart mildly tachy, irregular S1/S2, no S3/S4, 3/6 crescendo-decrescendo murmur RUSB. Trace-1+ ankle edema. R chest tube in place.  Abdomen: Soft, NT, ND, no HSM. No bruits or masses. +BS  Neurologic: Alert and oriented x 3.  Psych: Normal affect. Extremities: No clubbing or cyanosis. RUE PICC  TELEMETRY: Reviewed, A fib 100-110s  ASSESSMENT AND PLAN: 65 y/o ?with a h/o severe AS, LV dysfxn, HTN, noncompliance, thyroid nodules, pericardial effusion, cholelithiasis, and persistent AF on eliquis, who presentedto clinic on 08/23/16 with volume overload and was admitted for further work up of AS and CHF.  1. Acute on chronic systolic CHF: EF 30-86% in setting of severe aortic stenosis.  May be due to severe aortic stenosis given lack of flow-limiting CAD.  This admission, he was initially hypotensive with SBP in 80s though BP has improved to 100s-110s range.  He was also markedly volume overloaded. PICC was placed, low output confirmed by co-ox 47%.  Milrinone 0.25 started then increased to 0.375, co-ox marginal at 55% today.  Creatinine 1.22 today.  CVP 9-10.  Now on po Lasix - Continue Lasix 60 mg po bid.  - Decrease milrinone to 0.25 mcg/kg/min.    - Start lisinopril 2.5 mg daily.  - Continue spiro 25 mg daily.   - Continue digoxin, level ok this week.  2. Aortic stenosis: Severe AS.   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 McAlhany and [place]. - Concern for LAA thrombus on CT, TEE 10/23 confirmed amorphous material in LAA that appears to be developing thrombus.  Pt made aware of higher than usual risk with this and explained risks in detail (please see previous note). Pt agreed to proceed.  3. AKI on CKD stage III: Baseline creatinine 1.7-1.8.  Improved to 1.22 today, think he is building up a little fluid with higher CVP.  - Continue to follow closely with po Lasix and contrast exposure with CTs.  4. Atrial fibrillation: Persistent, with RVR this admission.  He has been in atrial fibrillation at least since 6/17.  No prior DCCV.  Not sure how long prior to 6/17 he was in atrial fibrillation.  - Restart amiodarone gtt at 30 mg/hr for rate control. Currently in 100-110s at rest.   - Was held yesterday for post-op bradycardia. - Continue heparin gtt (was on apixaban at home).  - With LAA thrombus despite Eliquis, will use Coumadin for now. Goal 2.5 - 3.0. Dosing per  pharmacy - Continue digoxin. - Will not be able to do DCCV at this point with atrial thrombus.  5. Pericardial effusion: Moderate to large, no tamponade.  s/p pericardiocentesis with Dr Clifton James 10/20. 775 ccs fluid out. Fluid is transudative.  Pericardial drain removed 09/05/16.   6. Cirrhosis: Noted on prior abdominal CT, LFTs improved with diuresis.  7. CAD:  LHC with moderate coronary disease, nothing flow-limiting.  - Continue statin and 81 mg aspirin.   OK to pull swan.  Per Dr. Laneta Simmers chest tubes coming out today as well. Restart amio and decrease milrinone.   Graciella Freer, PA-C 09/06/2016 9:44 AM  Advanced Heart Failure Team Pager [phone] (M-F; 7a - 4p)  Please contact [facility] for night-coverage after hours (4p -7a ) and weekends on [email or web address]   Patient seen with PA, doing well overall.  Can decrease milrinone to 0.25, echo today.   Marca Ancona 09/06/2016

## 2016-09-06 NOTE — Progress Notes (Addendum)
ANTICOAGULATION CONSULT NOTE - Follow Up Consult  Pharmacy Consult for heparin bridge to warfarin Indication: atrial fibrillation and atrial thrombus  Allergies  Allergen Reactions  . Potassium-Containing Compounds Other (See Comments)    Upset stomach    Patient Measurements: Height: 5\' 8"  (172.7 cm) Weight: 171 lb 15.3 oz (78 kg) IBW/kg (Calculated) : 68.4  Vital Signs: Temp: 99 F (37.2 C) (10/25 0700) Temp Source: Core (Comment) (10/25 0400) BP: 112/69 (10/25 0700) Pulse Rate: 74 (10/25 0700)  Labs:  Recent Labs  09/04/16 0427  09/05/16 0315 09/05/16 1345 09/05/16 1400 09/06/16 0043 09/06/16 0357 09/06/16 1136  HGB 11.8*  --  11.4* 12.9* 11.6*  --  11.2*  --   HCT 37.4*  --  36.4* 38.0* 36.7*  --  35.5*  --   PLT 134*  --  165  --  134*  --  135*  --   APTT  --   --   --   --  30  --   --   --   LABPROT  --   --   --   --  15.4*  --   --   --   INR  --   --   --   --  1.21  --   --   --   HEPARINUNFRC 0.25*  < > 0.55  --   --  0.46  --  0.59  CREATININE 1.43*  --  1.40*  --   --   --  1.22  --   < > = values in this interval not displayed.  Estimated Creatinine Clearance: 56.8 mL/min (by C-G formula based on SCr of 1.22 mg/dL).  Assessment: 65yom on eliquis pta for afib, transitioned to heparin for pericardiocentesis on 10/20 with drain placement. Drain removed 10/24 and he is now day 1 s/p TAVR. Heparin was resumed last night for history of afib and developing atrial thrombus that was seen on TEE. Now to bridge to warfarin for treatment. Heparin level remains therapeutic at previous rate. No issues reported.  Goal of Therapy:  Heparin level 0.3-0.7 units/ml  INR 2.5-3 Monitor platelets by anticoagulation protocol: Yes   Plan:  1) Warfarin 7.5mg *1 tonight 2) Continue heparin at 1900 units/hour 3) Daily heparin level, INR and CBC 4) Warfarin education  Sherron Monday 09/06/2016,12:26 PM

## 2016-09-06 NOTE — Progress Notes (Addendum)
   Called with BP of 62/40.  Atrial fib rate 109.  Patient is asymptomatic other than nausea earlier tonight.  No distress.  Exam unremarkable.  Femoral access sites OK.  Lungs left basilar fine crackles.  CVP is 0.  I will give fluid.  Continue current milrinone.    Addendum:  Throughout the night the patient was given bolus of fluid carefully thus far totaling 1 liter.  His CVP is up to 3.  He was started on Levophed.  BP has slowly risen.  However, he has had ATN with creat increased.  CoOx is reduced.  Likely will need further gentle hydration.  Hold Lasix and ACE in the AM.    He has been nauseated but otherwise no distress.

## 2016-09-06 NOTE — Progress Notes (Addendum)
Pt automatic BP reading 62/45, MAP 52. Pt states he is asymptomatic. Paged Dr. Clifton James and Cardiologist on call. Given verbal order to check manual BP and will come to bedside. Manual BP 60/44, current CVP 0.  2204, Dr. Antoine Poche to bedside to assess patient, verbal order for 250 mL bolus over 1 hour. Will implement and continue to monitor.  Dianna Rossetti F, RN  ADDENDUM - Per Dr. Antoine Poche, goal BP is systolic >70 or MAP >55.

## 2016-09-06 NOTE — Progress Notes (Signed)
     SUBJECTIVE: NO chest pain or dyspnea.   Tele:atrial fib, rate 100-110 bpm  BP (!) 89/59   Pulse 72   Temp 99 F (37.2 C)   Resp (!) 26   Ht 5\' 8"  (1.727 m)   Wt 171 lb 15.3 oz (78 kg)   SpO2 97%   BMI 26.15 kg/m   Intake/Output Summary (Last 24 hours) at 09/06/16 0650 Last data filed at 09/06/16 0600  Gross per 24 hour  Intake          2365.38 ml  Output             5255 ml  Net         -2889.62 ml    PHYSICAL EXAM General: Well developed, well nourished, in no acute distress. Alert and oriented x 3.  Psych:  Good affect, responds appropriately Neck: No JVD. No masses noted.  Lungs: Clear bilaterally with no wheezes or rhonci noted.  Heart: irreg irreg with soft systolic murmur noted. Abdomen: Bowel sounds are present. Soft, non-tender.  Extremities: No lower extremity edema.   LABS: Basic Metabolic Panel:  Recent Labs  95/62/13 0315 09/05/16 1345 09/06/16 0357  NA 133* 139 135  K 4.0 3.2* 3.5  CL 97*  --  99*  CO2 29  --  27  GLUCOSE 138* 103* 98  BUN 16  --  12  CREATININE 1.40*  --  1.22  CALCIUM 8.3*  --  8.2*  MG  --   --  1.7   CBC:  Recent Labs  09/05/16 1400 09/06/16 0357  WBC 8.2 7.3  HGB 11.6* 11.2*  HCT 36.7* 35.5*  MCV 82.1 81.4  PLT 134* 135*    Current Meds: . acetaminophen  1,000 mg Oral Q6H   Or  . acetaminophen (TYLENOL) oral liquid 160 mg/5 mL  1,000 mg Per Tube Q6H  . acetaminophen (TYLENOL) oral liquid 160 mg/5 mL  650 mg Per Tube Once   Or  . acetaminophen  650 mg Rectal Once  . aspirin  81 mg Oral Daily  . atorvastatin  40 mg Oral q1800  . cefUROXime (ZINACEF)  IV  1.5 g Intravenous Q12H  . digoxin  0.125 mg Oral Daily  . famotidine (PEPCID) IV  20 mg Intravenous Q12H  . furosemide  60 mg Oral BID  . insulin aspart  0-24 Units Subcutaneous Q4H  . insulin regular  0-10 Units Intravenous TID WC  . magnesium sulfate  4 g Intravenous Once  . mouth rinse  15 mL Mouth Rinse BID  . [START ON 09/07/2016]  pantoprazole  40 mg Oral Daily  . polyethylene glycol  17 g Oral Daily  . potassium chloride  10 mEq Intravenous Q1 Hr x 3  . sodium chloride flush  10-40 mL Intracatheter Q12H  . sodium chloride flush  3 mL Intravenous Q12H  . spironolactone  25 mg Oral Daily     ASSESSMENT AND PLAN:  1. Severe aortic valve stenosis: POD #1 s/p TAVR. He is doing well today. No neuro deficits. BP stable. 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 He can be started on coumadin today. Will continue ASA. Will plan echo today. If ok with CHF team, can d/c arterial line, SWAN and foley catheter today.   2. Systolic CHF/non-ischemic cardiomyopathy: per CHF team   Verne Carrow  10/25/20176:50 AM

## 2016-09-07 ENCOUNTER — Other Ambulatory Visit: Payer: Self-pay | Admitting: *Deleted

## 2016-09-07 ENCOUNTER — Inpatient Hospital Stay (HOSPITAL_COMMUNITY): Payer: Commercial Managed Care - HMO

## 2016-09-07 DIAGNOSIS — I509 Heart failure, unspecified: Secondary | ICD-10-CM

## 2016-09-07 DIAGNOSIS — I950 Idiopathic hypotension: Secondary | ICD-10-CM

## 2016-09-07 DIAGNOSIS — J942 Hemothorax: Secondary | ICD-10-CM

## 2016-09-07 DIAGNOSIS — I350 Nonrheumatic aortic (valve) stenosis: Secondary | ICD-10-CM

## 2016-09-07 LAB — CBC
HCT: 29.7 % — ABNORMAL LOW (ref 39.0–52.0)
HCT: 30 % — ABNORMAL LOW (ref 39.0–52.0)
HEMATOCRIT: 31.8 % — AB (ref 39.0–52.0)
HEMOGLOBIN: 10.2 g/dL — AB (ref 13.0–17.0)
Hemoglobin: 9.2 g/dL — ABNORMAL LOW (ref 13.0–17.0)
Hemoglobin: 9.3 g/dL — ABNORMAL LOW (ref 13.0–17.0)
MCH: 25.2 pg — AB (ref 26.0–34.0)
MCH: 25.4 pg — ABNORMAL LOW (ref 26.0–34.0)
MCH: 25.9 pg — AB (ref 26.0–34.0)
MCHC: 31 g/dL (ref 30.0–36.0)
MCHC: 31 g/dL (ref 30.0–36.0)
MCHC: 32.1 g/dL (ref 30.0–36.0)
MCV: 81.4 fL (ref 78.0–100.0)
MCV: 82 fL (ref 78.0–100.0)
MCV: 80.7 fL (ref 78.0–100.0)
PLATELETS: 171 10*3/uL (ref 150–400)
Platelets: 157 10*3/uL (ref 150–400)
Platelets: 168 10*3/uL (ref 150–400)
RBC: 3.65 MIL/uL — AB (ref 4.22–5.81)
RBC: 3.66 MIL/uL — ABNORMAL LOW (ref 4.22–5.81)
RBC: 3.94 MIL/uL — ABNORMAL LOW (ref 4.22–5.81)
RDW: 17.7 % — AB (ref 11.5–15.5)
RDW: 17.7 % — AB (ref 11.5–15.5)
RDW: 17.3 % — ABNORMAL HIGH (ref 11.5–15.5)
WBC: 15.3 10*3/uL — AB (ref 4.0–10.5)
WBC: 18.9 10*3/uL — AB (ref 4.0–10.5)
WBC: 21 10*3/uL — ABNORMAL HIGH (ref 4.0–10.5)

## 2016-09-07 LAB — PREPARE RBC (CROSSMATCH)

## 2016-09-07 LAB — ECHOCARDIOGRAM LIMITED
AO mean calculated velocity dopler: 206 cm/s
AV Mean grad: 22 mmHg
AV Peak grad: 45 mmHg
AV VEL mean LVOT/AV: 0.47
AV pk vel: 337 cm/s
Ao pk vel: 0.44 m/s
Height: 68 in
LVOT VTI: 21.1 cm
LVOT peak VTI: 0.47 cm
LVOT peak grad rest: 9 mmHg
LVOT peak vel: 147 cm/s
VTI: 44.7 cm
Weight: 2860.69 oz

## 2016-09-07 LAB — COOXEMETRY PANEL
CARBOXYHEMOGLOBIN: 1.5 % (ref 0.5–1.5)
CARBOXYHEMOGLOBIN: 1.5 % (ref 0.5–1.5)
METHEMOGLOBIN: 1 % (ref 0.0–1.5)
Methemoglobin: 0.9 % (ref 0.0–1.5)
O2 Saturation: 45.9 %
O2 Saturation: 86.8 %
TOTAL HEMOGLOBIN: 9.3 g/dL — AB (ref 12.0–16.0)
TOTAL HEMOGLOBIN: 9.9 g/dL — AB (ref 12.0–16.0)

## 2016-09-07 LAB — BASIC METABOLIC PANEL
ANION GAP: 9 (ref 5–15)
Anion gap: 13 (ref 5–15)
BUN: 18 mg/dL (ref 6–20)
BUN: 23 mg/dL — ABNORMAL HIGH (ref 6–20)
CO2: 26 mmol/L (ref 22–32)
CO2: 22 mmol/L (ref 22–32)
Calcium: 7.9 mg/dL — ABNORMAL LOW (ref 8.9–10.3)
Calcium: 8.4 mg/dL — ABNORMAL LOW (ref 8.9–10.3)
Chloride: 94 mmol/L — ABNORMAL LOW (ref 101–111)
Chloride: 94 mmol/L — ABNORMAL LOW (ref 101–111)
Creatinine, Ser: 2.18 mg/dL — ABNORMAL HIGH (ref 0.61–1.24)
Creatinine, Ser: 3.09 mg/dL — ABNORMAL HIGH (ref 0.61–1.24)
GFR calc Af Amer: 34 mL/min — ABNORMAL LOW (ref >60)
GFR calc Af Amer: 22 mL/min — ABNORMAL LOW (ref >60)
GFR calc non Af Amer: 30 mL/min — ABNORMAL LOW (ref >60)
GFR calc non Af Amer: 19 mL/min — ABNORMAL LOW (ref >60)
GLUCOSE: 251 mg/dL — AB (ref 65–99)
Glucose, Bld: 143 mg/dL — ABNORMAL HIGH (ref 65–99)
POTASSIUM: 4 mmol/L (ref 3.5–5.1)
Potassium: 4.3 mmol/L (ref 3.5–5.1)
Sodium: 129 mmol/L — ABNORMAL LOW (ref 135–145)
Sodium: 129 mmol/L — ABNORMAL LOW (ref 135–145)

## 2016-09-07 LAB — PROTIME-INR
INR: 1.54
Prothrombin Time: 18.7 seconds — ABNORMAL HIGH (ref 11.4–15.2)

## 2016-09-07 LAB — GLUCOSE, CAPILLARY
GLUCOSE-CAPILLARY: 137 mg/dL — AB (ref 65–99)
Glucose-Capillary: 110 mg/dL — ABNORMAL HIGH (ref 65–99)
Glucose-Capillary: 126 mg/dL — ABNORMAL HIGH (ref 65–99)

## 2016-09-07 LAB — HEPARIN LEVEL (UNFRACTIONATED): Heparin Unfractionated: 1.02 IU/mL — ABNORMAL HIGH (ref 0.30–0.70)

## 2016-09-07 MED ORDER — SODIUM CHLORIDE 0.9 % IV SOLN: Freq: Once | INTRAVENOUS | Status: DC

## 2016-09-07 MED ORDER — SODIUM CHLORIDE 0.9 % IV BOLUS (SEPSIS)
125.0000 mL | Freq: Once | INTRAVENOUS | Status: AC
  Administered 2016-09-07: 125 mL via INTRAVENOUS

## 2016-09-07 MED ORDER — WHITE PETROLATUM GEL
Status: DC | PRN
  Filled 2016-09-07: qty 28.35

## 2016-09-07 MED ORDER — NOREPINEPHRINE BITARTRATE 1 MG/ML IV SOLN
0.0000 ug/min | INTRAVENOUS | Status: DC
  Administered 2016-09-07: 18 ug/min via INTRAVENOUS
  Administered 2016-09-07 – 2016-09-08 (×2): 50 ug/min via INTRAVENOUS
  Administered 2016-09-08: 33 ug/min via INTRAVENOUS
  Administered 2016-09-08: 50 ug/min via INTRAVENOUS
  Administered 2016-09-08: 40 ug/min via INTRAVENOUS
  Administered 2016-09-09: 12 ug/min via INTRAVENOUS
  Filled 2016-09-07 (×9): qty 16

## 2016-09-07 MED ORDER — SODIUM CHLORIDE 0.9 % IV SOLN
INTRAVENOUS | Status: AC
  Administered 2016-09-07: 18:00:00 via INTRAVENOUS

## 2016-09-07 MED ORDER — LIDOCAINE HCL (PF) 1 % IJ SOLN
INTRAMUSCULAR | Status: AC
  Administered 2016-09-07: 30 mL
  Filled 2016-09-07: qty 30

## 2016-09-07 MED ORDER — MAGNESIUM SULFATE 4 GM/100ML IV SOLN
4.0000 g | Freq: Once | INTRAVENOUS | Status: AC
  Administered 2016-09-07: 4 g via INTRAVENOUS
  Filled 2016-09-07: qty 100

## 2016-09-07 MED ORDER — METOCLOPRAMIDE HCL 5 MG/ML IJ SOLN
5.0000 mg | Freq: Four times a day (QID) | INTRAMUSCULAR | Status: AC
  Administered 2016-09-07 (×4): 5 mg via INTRAVENOUS
  Filled 2016-09-07 (×4): qty 2

## 2016-09-07 NOTE — Progress Notes (Signed)
  Echocardiogram 2D Echocardiogram limited has been performed.  Lucas Munoz 09/07/2016, 11:56 AM

## 2016-09-07 NOTE — Progress Notes (Signed)
2 Days Post-Op Procedure(s) (LRB): TRANSCATHETER AORTIC VALVE REPLACEMENT, TRANSFEMORAL (N/A) TRANSESOPHAGEAL ECHOCARDIOGRAM (TEE) (N/A) CHEST TUBE INSERTION (Right) Subjective:  Nauseous and vomited multiple times overnight.  Developed hypotension overnight and started on Levophed. Given 750 cc saline in multiple small boluses. His levophed was up to 20 mcg this am. Co-ox down to 46 this am. CXR this am shows new large right pleural effusion. Hgb dropped from 11.2 yesterday am to 9.2 this am. Anuric overnight but foley out yesterday. Creat up from 1.2 yesterday am to 2.2 this am. Bladder scanned and only 30 cc present this am.   Objective: Vital signs in last 24 hours: Temp:  [97.4 F (36.3 C)-99 F (37.2 C)] 98.6 F (37 C) (10/26 0835) Pulse Rate:  [52-144] 129 (10/26 0800) Cardiac Rhythm: Atrial fibrillation (10/26 0800) Resp:  [4-40] 18 (10/26 0800) BP: (54-129)/(33-101) 118/101 (10/26 0800) SpO2:  [88 %-100 %] 94 % (10/26 0800) Arterial Line BP: (95-127)/(51-60) 127/60 (10/25 1000) Weight:  [81.1 kg (178 lb 12.7 oz)] 81.1 kg (178 lb 12.7 oz) (10/26 0500)  Hemodynamic parameters for last 24 hours: PAP: (38-49)/(16-27) 49/27 CVP:  [0 mmHg-16 mmHg] 3 mmHg  Intake/Output from previous day: 10/25 0701 - 10/26 0700 In: 2079.1 [I.V.:1279.1; IV Piggyback:800] Out: 400 [Urine:400] Intake/Output this shift: Total I/O In: -  Out: 1000 [Chest Tube:1000]  General appearance: alert, cooperative and mild distress Neurologic: intact Heart: irregularly irregular rhythm Lungs: diminished breath sounds RLL Abdomen: soft, non-tender; bowel sounds normal; no masses,  no organomegaly Extremities: edema minimal Wound: groin access sites ok  Lab Results:  Recent Labs  09/06/16 0357 09/07/16 0300  WBC 7.3 15.3*  HGB 11.2* 9.2*  HCT 35.5* 29.7*  PLT 135* 171   BMET:  Recent Labs  09/06/16 0357 09/07/16 0300  NA 135 129*  K 3.5 4.0  CL 99* 94*  CO2 27 26  GLUCOSE 98 251*   BUN 12 18  CREATININE 1.22 2.18*  CALCIUM 8.2* 7.9*    PT/INR:  Recent Labs  09/07/16 0300  LABPROT 18.7*  INR 1.54   ABG    Component Value Date/Time   PHART 7.420 09/05/2016 1350   HCO3 28.4 (H) 09/05/2016 1350   TCO2 30 09/05/2016 1350   O2SAT 45.9 09/07/2016 0310   CBG (last 3)   Recent Labs  09/06/16 1729 09/06/16 1934 09/07/16 0833  GLUCAP 161* 197* 137*   CLINICAL DATA:  Vomiting  EXAM: PORTABLE CHEST 1 VIEW  COMPARISON:  Yesterday  FINDINGS: Stable right upper extremity PICC. Swan-Ganz catheter and right jugular introducer have been removed. Right chest tube is been removed. A large right pleural effusion has accumulated. There is compressive atelectasis within much of the right lung. Vascular congestion. Left lung is otherwise clear. Cardiomegaly. Small right pneumothorax is suspected.  IMPRESSION: Right chest tube removed. Large right pleural effusion has reaccumulated. Very small right apical pneumothorax has also developed.   Electronically Signed   By: Jolaine ClickArthur  Hoss M.D.   On: 09/07/2016 07:39  Assessment/Plan: S/P Procedure(s) (LRB): TRANSCATHETER AORTIC VALVE REPLACEMENT, TRANSFEMORAL (N/A) TRANSESOPHAGEAL ECHOCARDIOGRAM (TEE) (N/A) CHEST TUBE INSERTION (Right)  He bled into the right chest overnight with large right pleural effusion on heparin, coumadin, ASA and developed marked hypotension requiring levophed in addition to the Milrinone he was already on. He received some fluid for low CVP1-3. CVP is still 2 this am. Will repeat Hgb this am but I am going to give him a unit of PRBC's with low CVP and high levophed requirement.  Hold anticoagulation for now.   LOS: 14 days    [name] 09/07/2016

## 2016-09-07 NOTE — Progress Notes (Signed)
Pt BP 68/56, CVP 3, notified MD. Orders for 125 cc bolus and may repeat in 45-60 minutes if remains hypotensive. Will implement and continue to monitor closely.  Herma Ard, RN

## 2016-09-07 NOTE — Progress Notes (Signed)
ANTICOAGULATION CONSULT NOTE - Follow Up Consult  Pharmacy Consult for heparin Indication: atrial fibrillation and atrial thrombus   Labs:  Recent Labs  09/04/16 0427  09/05/16 0315  09/05/16 1400 09/06/16 0043 09/06/16 0357 09/06/16 1136 09/07/16 0300  HGB 11.8*  --  11.4*  < > 11.6*  --  11.2*  --  9.2*  HCT 37.4*  --  36.4*  < > 36.7*  --  35.5*  --  29.7*  PLT 134*  --  165  --  134*  --  135*  --  171  APTT  --   --   --   --  30  --   --   --   --   LABPROT  --   --   --   --  15.4*  --   --   --  18.7*  INR  --   --   --   --  1.21  --   --   --  1.54  HEPARINUNFRC 0.25*  < > 0.55  --   --  0.46  --  0.59 1.02*  CREATININE 1.43*  --  1.40*  --   --   --  1.22  --   --   < > = values in this interval not displayed.   Assessment: 65yo male now above goal on heparin after several levels at goal though may be accumulating; Hgb down 2 points this am, no overt signs of bleeding per RN.  Goal of Therapy:  Heparin level 0.3-0.7 units/ml   Plan:  Will decrease heparin gtt by 4 units/kg/hr to 1600 units/hr (was previously below goal at this rate but apparently accumulating) and check level in 6hr.  Vernard Gambles, PharmD, BCPS  09/07/2016,3:41 AM

## 2016-09-07 NOTE — Progress Notes (Signed)
Patient ID: Lucas Munoz, male   DOB: [birth year], 65 y.o.   MRN: [id number]    SUBJECTIVE:   PICC placed , initial co-ox on 10/14 47%.  He was started on milrinone. Milrinone increased to 0.375 mcg 08/29/16 for low mixed venous sat on RHC (CI 1.9).  Underwent pericardiocentesis on 10/20 with 775cc out. Fluid transudative. Drained another 225 cc yesterday  TEE 10/23 confirmed amorphous material in LA appendage, appears to be forming thrombus.  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   Post-TAVR echo 10/25 with EF up to 45%, moderate LVH, bioprosthetic AVR with mean gradient 20 (higher than expected), PASP 37, normal RV, no pericardial effusion.   He did well yesterday, but overnight developed nausea/vomiting and hypotension.  He was started on norepinephrine and got around 1 L IV fluid given CVP 1.  Today, CXR showed large right pleural effusion accumulation. Chest tube replaced on right, 1300 cc bloody fluid out.  Heparin stopped.  He remained hypotensive.  1 unit PRBCs given (hemoglobin down 11.2 => 9.2).  Also noted rise in creatinine from 1.2 => 2.2.    RHC/LHC (08/29/16) Left Main  Mild ostial narrowing.  Left Anterior Descending  30% proximal LAD stenosis. 60% distal LAD stenosis. Moderate D1 without significant disease. Small to moderate D2 with long 70% ostial stenosis.  Left Circumflex  50% proximal LCx stenosis at OM1. Moderate OM1 with 30% ostial stenosis.  Right Coronary Artery  40% proximal and 40% mid RCA stenosis.   RA mean 19 RV 65/15 PA 69/34, mean 51 PCWP mean 43 (not sure we got an accurate PCWP).  AO 106/87 Oxygen saturations: PA 46% AO 92% Cardiac Output (Fick) 3.8  Cardiac Index (Fick) 1.9  Echo (10/14): EF 15%, severe AS, moderately decreased RV systolic function, PASP 65 mmHg, moderate to large pericardial effusion without tamponade.   Scheduled Meds: . sodium chloride   Intravenous Once  . acetaminophen  1,000 mg  Oral Q6H   Or  . acetaminophen (TYLENOL) oral liquid 160 mg/5 mL  1,000 mg Per Tube Q6H  . acetaminophen (TYLENOL) oral liquid 160 mg/5 mL  650 mg Per Tube Once   Or  . acetaminophen  650 mg Rectal Once  . aspirin  81 mg Oral Daily  . atorvastatin  40 mg Oral q1800  . cefUROXime (ZINACEF)  IV  1.5 g Intravenous Q12H  . magnesium sulfate 1 - 4 g bolus IVPB  4 g Intravenous Once  . mouth rinse  15 mL Mouth Rinse BID  . metoCLOPramide (REGLAN) injection  5 mg Intravenous Q6H  . pantoprazole  40 mg Oral Daily  . polyethylene glycol  17 g Oral Daily  . sodium chloride flush  10-40 mL Intracatheter Q12H  . sodium chloride flush  3 mL Intravenous Q12H   Continuous Infusions: . amiodarone 30 mg/hr (09/07/16 1100)  . milrinone 0.25 mcg/kg/min (09/07/16 1100)  . norepinephrine (LEVOPHED) Adult infusion 25 mcg/min (09/07/16 1103)   PRN Meds:.sodium chloride, acetaminophen, alum & mag hydroxide-simeth, docusate sodium, lactulose, morphine injection, ondansetron (ZOFRAN) IV, ondansetron (ZOFRAN) IV, oxyCODONE, oxyCODONE-acetaminophen, sodium chloride flush, sodium chloride flush, traMADol, traZODone   Vitals:   09/07/16 1030 09/07/16 1042 09/07/16 1100 09/07/16 1106  BP: (!) 74/64  (!) 73/31 90/67  Pulse:  (!) 138  89  Resp: 14  12 16   Temp:    97.8 F (36.6 C)  TempSrc:    Axillary  SpO2:    98%  Weight:  Height:        Intake/Output Summary (Last 24 hours) at 09/07/16 1116 Last data filed at 09/07/16 1106  Gross per 24 hour  Intake          2463.48 ml  Output             1510 ml  Net           953.48 ml    LABS: Basic Metabolic Panel:  Recent Labs  99/24/26 0357 09/07/16 0300  NA 135 129*  K 3.5 4.0  CL 99* 94*  CO2 27 26  GLUCOSE 98 251*  BUN 12 18  CREATININE 1.22 2.18*  CALCIUM 8.2* 7.9*  MG 1.7  --    Liver Function Tests: No results for input(s): AST, ALT, ALKPHOS, BILITOT, PROT, ALBUMIN in the last 72 hours. No results for input(s): LIPASE, AMYLASE in  the last 72 hours. CBC:  Recent Labs  09/07/16 0300 09/07/16 0945  WBC 15.3* 18.9*  HGB 9.2* 9.3*  HCT 29.7* 30.0*  MCV 81.4 82.0  PLT 171 157   Cardiac Enzymes: No results for input(s): CKTOTAL, CKMB, CKMBINDEX, TROPONINI in the last 72 hours. BNP: Invalid input(s): POCBNP D-Dimer: No results for input(s): DDIMER in the last 72 hours. Hemoglobin A1C: No results for input(s): HGBA1C in the last 72 hours. Fasting Lipid Panel: No results for input(s): CHOL, HDL, LDLCALC, TRIG, CHOLHDL, LDLDIRECT in the last 72 hours. Thyroid Function Tests: No results for input(s): TSH, T4TOTAL, T3FREE, THYROIDAB in the last 72 hours.  Invalid input(s): FREET3 Anemia Panel: No results for input(s): VITAMINB12, FOLATE, FERRITIN, TIBC, IRON, RETICCTPCT in the last 72 hours.  RADIOLOGY: Ct Cardiac Morph/pulm Vein W/cm&w/o Ca Score  Addendum Date: 09/01/2016   ADDENDUM REPORT: 09/01/2016 17:18 CLINICAL DATA:  Aortic stenosis EXAM: Cardiac TAVR CT TECHNIQUE: The patient was scanned on a Philips 256 scanner. A 120 kV retrospective scan was triggered in the descending thoracic aorta at 111 HU's. Gantry rotation speed was 270 msecs and collimation was .9 mm. No beta blockade or nitro were given. The 3D data set was reconstructed in 5% intervals of the R-R cycle. Systolic and diastolic phases were analyzed on a dedicated work station using MPR, MIP and VRT modes. The patient received 80 cc of contrast. 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 Heavily calcified Aorta:  Aortic root dilated Sinotubular Junction:  32 mm Ascending Thoracic Aorta:  43 mm Aortic Arch:  30 mm Descending Thoracic Aorta:  26 mm Sinus of Valsalva Measurements: Non-coronary:  33 mm Right -coronary:  31 mm Left -coronary:  32 mm Coronary Artery Height above Annulus: Left Main:  19 mm above annulus Right Coronary:  10 mm above annulus Virtual Basal Annulus Measurements: Maximum/Minimum Diameter:  27.1 x  20.3 mm Perimeter:  78 mm Area:  442 mm2 Coronary Arteries:  Sufficient height above annulus for deployment Optimum Fluoroscopic Angle for Delivery:  LAO 29 degrees IMPRESSION: 1) functionally bicuspid AV heavily calcified with annulus suitable for 26 mm Sapien 3 valve 2) Moderate aortic root enlargement 4.3 cm 3) Large pericardial effusion patient to have pericardiocentesis post CT 4) Cannot r/o LAA thrombus suggest TEE correlation 5) Optimum angiographic angle for deployment LAO 29 degrees 6) Coronary heights sufficient for deployment Charlton Haws Electronically Signed   By: Charlton Haws M.D.   On: 09/01/2016 17:18   Result Date: 09/01/2016 EXAM: OVER-READ INTERPRETATION  CT CHEST The following report is an over-read performed by radiologist Dr. Reuel Boom  [facility], [place] on 09/01/2016. This over-read does not include interpretation of cardiac or coronary anatomy or pathology. The coronary calcium score/coronary CTA interpretation by the cardiologist is attached. COMPARISON:  Chest CT 05/01/2016. FINDINGS: Large pericardial effusion. No pericardial calcification. Aortic atherosclerosis with aneurysmal dilatation of the ascending thoracic aorta which measures up to 4.8 cm in diameter (unchanged). Large right-sided pleural effusion layering dependently. Extensive passive atelectasis in the right lower lobe. Tiny calcified granuloma in the periphery of the right lower lobe. 4 mm subpleural noncalcified pulmonary nodule in the left lower lobe abutting the major fissure (image 71 of series 512), unchanged compared to prior study 05/01/2016, favored to represent a subpleural lymph node. No other suspicious appearing pulmonary nodules or masses are noted in the visualized portions of the thorax. Partially calcified pleural plaque in the right hemithorax (image 31 of series 513) again noted. No calcified pleural plaques in the left hemithorax. Visualized portions of the upper abdomen demonstrate a  nodular contour of the liver, suggestive of underlying cirrhosis. No aggressive appearing lytic or blastic lesions are noted in the visualized portions of the skeleton. Right upper extremity PICC with tip terminating in the distal superior vena cava. IMPRESSION: 1. Interval increased size of what is now a large pericardial effusion. No associated pericardial calcification. 2. Enlargement of a large layering right-sided pleural effusion. This is associated with extensive passive atelectasis in the right lower lobe. 3. Aortic atherosclerosis, with similar aneurysmal dilatation of the ascending thoracic aorta which measures up to 4.8 cm in diameter. 4. Morphologic changes in the liver indicative of cirrhosis, as above. 5. 4 mm subpleural nodule in the periphery of the left lower lobe is nonspecific, but statistically likely a subpleural lymph node. No follow-up needed if patient is low-risk. Non-contrast chest CT can be considered in 12 months if patient is high-risk. This recommendation follows the consensus statement: Guidelines for Management of Incidental Pulmonary Nodules Detected on CT Images: From the Fleischner Society 2017; Radiology 2017; 284:228-243. 6. Additional incidental findings, as above. Electronically Signed: By: Trudie Reed M.D. On: 09/01/2016 14:38   Dg Chest Port 1 View  Result Date: 09/07/2016 CLINICAL DATA:  Hemothorax with new right chest tube. EXAM: PORTABLE CHEST 1 VIEW COMPARISON:  Earlier today FINDINGS: Decreased but still extensive right hemothorax after chest tube placement. No visualized gas component. Right upper extremity PICC with tip at the SVC level. Unchanged cardiopericardial enlargement. Status post transcatheter aortic valve replacement. IMPRESSION: 1. Right chest tube placement with diminished hemothorax. 2. Elsewhere stable chest. Electronically Signed   By: Marnee Spring M.D.   On: 09/07/2016 09:05   Dg Chest Port 1 View  Result Date: 09/07/2016 CLINICAL  DATA:  Vomiting EXAM: PORTABLE CHEST 1 VIEW COMPARISON:  Yesterday FINDINGS: Stable right upper extremity PICC. Swan-Ganz catheter and right jugular introducer have been removed. Right chest tube is been removed. A large right pleural effusion has accumulated. There is compressive atelectasis within much of the right lung. Vascular congestion. Left lung is otherwise clear. Cardiomegaly. Small right pneumothorax is suspected. IMPRESSION: Right chest tube removed. Large right pleural effusion has reaccumulated. Very small right apical pneumothorax has also developed. Electronically Signed   By: Jolaine Click M.D.   On: 09/07/2016 07:39   Dg Chest Port 1 View  Result Date: 09/06/2016 CLINICAL DATA:  Cardiac surgery EXAM: PORTABLE CHEST 1 VIEW COMPARISON:  Yesterday FINDINGS: Right upper extremity PICC, right chest tube, right jugular introducer, right Swan-Ganz catheter are stable. There is no pneumothorax.  Small right pleural effusion is stable. Vascular congestion and mild edema are worse. Lower lung volumes. Cardiac silhouette remains markedly enlarged. IMPRESSION: Worsening vascular congestion and edema. No pneumothorax. Electronically Signed   By: Jolaine ClickArthur  Hoss M.D.   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 Postoperative image. EXAM: PORTABLE CHEST 1 VIEW COMPARISON:  CT chest 09/01/2016 and single view of the chest 08/26/2016. FINDINGS: Endotracheal tube is in place with tip in good position at the level of the clavicular heads. Right IJ approach Swan-Ganz catheter tip is in the proximal right main pulmonary artery. Tip of right PICC projects in the lower superior vena cava. NG tube courses into the stomach. Right chest tube is identified. There is a small right pleural effusion and basilar atelectasis, improved since the prior chest film. The lungs are otherwise clear. The cardiopericardial silhouette is enlarged. No  pneumothorax. IMPRESSION: Support tubes and lines projecting good position. Negative for pneumothorax. Decreased small right pleural effusion and basilar atelectasis. Enlarged cardiopericardial silhouette consistent with cardiomegaly and pericardial effusion as seen on CT scan. Electronically Signed   By: Drusilla Kannerhomas  Dalessio M.D.   On: 09/05/2016 13:56   Dg Chest Port 1 View  Result Date: 08/26/2016 CLINICAL DATA:  65 year old male with a history of shortness of breath EXAM: PORTABLE CHEST 1 VIEW COMPARISON:  08/25/2016, CT chest 05/01/2016 FINDINGS: Compare to the prior plain film there is unchanged size of the cardiac silhouette. Calcifications of the aortic arch. Interval placement of right upper extremity PICC with the tip appearing to terminate at the superior cavoatrial junction. Increasing opacity at the right base with thickening of the minor fissure, obscuration the right hemidiaphragm and right heart border. Interlobular septal thickening. IMPRESSION: Evidence of CHF with increasing right-sided pleural effusion/atelectasis. Unchanged cardiac silhouette in this patient with a history of pericardial effusion imaged on prior CT 05/01/2016. Interval placement of right upper extremity PICC. Aortic atherosclerosis. Signed, Yvone NeuJaime S. Loreta AveWagner, DO [facility] Electronically Signed   By: Gilmer MorJaime  Wagner D.O.   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 Dilated cardiomyopathy. Acute kidney injury. EXAM: PORTABLE CHEST 1 VIEW COMPARISON:  None. FINDINGS: Marked cardiac enlargement noted. Diffuse interstitial infiltrates consistent with mild interstitial edema. Small right pleural effusion also seen with right basilar atelectasis. IMPRESSION: Mild congestive heart failure, with small right pleural effusion and right basilar atelectasis. Electronically Signed   By:  Myles RosenthalJohn  Stahl M.D.   On: 08/25/2016 15:14   Ct Angio Chest/abd/pel For Dissection W And/or W/wo  Result Date: 09/03/2016 CLINICAL DATA:  65 year old male with history of severe aortic stenosis. Preprocedural study prior to potential transcatheter aortic valve replacement. Recent history of pericardiocentesis. EXAM: CT ANGIOGRAPHY CHEST, ABDOMEN AND PELVIS TECHNIQUE: Multidetector CT imaging through the chest, abdomen and pelvis was performed using the standard protocol during bolus administration of intravenous contrast. Multiplanar reconstructed images and MIPs were obtained and reviewed to evaluate the vascular anatomy. CONTRAST:  80 mL of Isovue 370. COMPARISON:  Cardiac gated CTA 09/01/2016. FINDINGS: CTA CHEST FINDINGS Cardiovascular: Heart size is mildly enlarged. Small amount of residual pericardial fluid and/or thickening, decreased compared to the prior study. Pericardiocentesis catheter in place with tip terminating high in a superior pericardial recess adjacent to the aortic arch. No pericardial calcification. Severe thickening calcification of the aortic valve,  which has an appearance suggestive of a bicuspid valve. There is aortic atherosclerosis, as well as atherosclerosis of the great vessels of the mediastinum and the coronary arteries, including calcified atherosclerotic plaque in the left anterior descending and right coronary arteries. 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 Large filling defect within the left atrial appendage, concerning for potential left atrial appendage thrombus. Right upper extremity PICC with tip terminating at the superior cavoatrial junction. Mediastinum/Lymph Nodes: Several borderline enlarged and minimally enlarged mediastinal lymph nodes measuring up to 14 mm in short axis, slightly increased compared to the prior study, likely reactive. Esophagus is unremarkable in appearance. No axillary lymphadenopathy. Lungs/Pleura: Large  right pleural effusion again noted, lying dependently. This is associated with near complete passive atelectasis of the right lower lobe. Trace left pleural effusion with minimal passive subsegmental atelectasis in the dependent left lower lobe. No acute consolidative airspace disease. Previously noted tiny subpleural known nodule in the anterior aspect of the left lower lobe is no longer noted, presumably a reactive subpleural lymph node on the prior examination. Partially calcified small right-sided pleural plaque is unchanged (image 63 of series 5). No other calcified pleural plaques are identified. Tiny calcified granulomas are again noted in the periphery of the right lower lobe, now with an atelectatic lung. Musculoskeletal/Soft Tissues: There are no aggressive appearing lytic or blastic lesions noted in the visualized portions of the skeleton. CTA ABDOMEN AND PELVIS FINDINGS Hepatobiliary: Liver has a shrunken appearance and nodular contour, compatible with underlying cirrhosis. No discrete cystic or solid hepatic lesions. No intra or extrahepatic biliary ductal dilatation. Large rim calcified gallstone in the gallbladder measuring up to 3.8 cm in diameter. No current findings to suggest an acute cholecystitis at this time. Pancreas: No pancreatic mass or peripancreatic inflammatory changes. No pancreatic ductal dilatation. Pancreatic atrophy. No pancreatic or peripancreatic fluid. Spleen: Unremarkable. Adrenals/Urinary Tract: Bilateral kidneys and bilateral adrenal glands are unremarkable in appearance. No hydroureteronephrosis. In the dependent portion of the urinary bladder there is a 12 mm calculus (image 254 of series 5). Urinary bladder is otherwise unremarkable in appearance. Stomach/Bowel: Normal appearance of the stomach. No pathologic dilatation of small bowel or colon. Numerous colonic diverticulae are noted, without surrounding inflammatory changes to suggest an acute diverticulitis at this time.  The appendix is not confidently identified and may be surgically absent. Regardless, there are no inflammatory changes noted adjacent to the cecum to suggest the presence of an acute appendicitis at this time. Vascular/Lymphatic: Aortic atherosclerosis with vascular findings and measurements pertinent to potential TAVR procedure, as detailed below. No aneurysm or dissection identified in the abdominal or pelvic vasculature. Celiac axis, superior mesenteric artery and inferior mesenteric artery are all widely patent without hemodynamically significant stenosis. Single renal arteries bilaterally are widely patent. Portal vein is mildly dilated measuring 16 mm in the porta hepatis. Reproductive: Prostate gland and seminal vesicles are unremarkable in appearance. Other: Small left inguinal hernia containing a short segment of small bowel. No significant volume of ascites. No pneumoperitoneum. Musculoskeletal: There are no aggressive appearing lytic or blastic lesions noted in the visualized portions of the skeleton. VASCULAR MEASUREMENTS PERTINENT TO TAVR: AORTA: Minimal Aortic Diameter -  18 x 14 mm Severity of Aortic Calcification -  moderate RIGHT PELVIS: Right Common Iliac Artery - Minimal Diameter - 11.0 x 9.1 mm Tortuosity - moderate Calcification - mild to moderate Right External Iliac Artery - Minimal Diameter - 8.7 x 8.9 mm Tortuosity - moderate to severe Calcification -  none Right Common Femoral Artery - Minimal Diameter - 9.2 x 6.8 mm Tortuosity - mild Calcification - mild LEFT PELVIS: Left Common Iliac Artery - Minimal Diameter - 9.5 x 10.0 mm Tortuosity - severe Calcification - none Left External Iliac Artery - Minimal Diameter - 8.1 x 7.9 mm Tortuosity - moderate Calcification - none Left Common Femoral Artery - Minimal Diameter - 8.5 x 6.3 mm Tortuosity - mild Calcification - mild Review of the MIP images confirms the above findings. IMPRESSION: 1. Vascular findings and measurements pertinent to potential  TAVR procedure, as detailed above. This patient appears to have suitable pelvic arterial access, however, this access is most optimal on the right side secondary to the lesser degree of tortuosity. Additionally, there is a small left inguinal hernia containing a short segment of small bowel which comes in close proximity to the left common femoral artery. 2. Severe thickening calcification of what appears to be a bicuspid aortic valve, compatible with the reported clinical history of severe aortic stenosis. 3. Interval pericardiocentesis with persistent indwelling pericardiocentesis catheter, with significant decreased size of what is now only a small amount of residual pericardial fluid and/or thickening. 4. Large filling defect in the tip of the left atrial appendage, concerning for potential left atrial appendage thrombus (although this could simply reflect pseudo thrombus secondary to contrast bolus timing). Correlation with TEE is recommended, if not already performed. 5. Aortic atherosclerosis, in addition to 2 vessel coronary artery disease. Please note that although the presence of coronary artery calcium documents the presence of coronary artery disease, the severity of this disease and any potential stenosis cannot be assessed on this non-gated CT examination. Assessment for potential risk factor modification, dietary therapy or pharmacologic therapy may be warranted, if clinically indicated. 6. Persistent large right-sided pleural effusion with extensive passive atelectasis in the right lower lobe. Trace left pleural effusion with minimal dependent passive atelectasis in the left lower lobe. 7. Cholelithiasis without evidence of acute cholecystitis. 8. Colonic diverticulosis without evidence of acute diverticulitis at this time. 9. Morphologic changes in the liver compatible with underlying cirrhosis. 10. Additional incidental findings, as above. Electronically Signed   By: Trudie Reed M.D.   On:  09/03/2016 12:24    PHYSICAL EXAM CVP 1 General: NAD Neck: No JVD Lungs: Decreased breath sounds on right.  CV: Nondisplaced PMI.  Heart mildly tachy, irregular S1/S2, no S3/S4, 2/6 SEM RUSB.  No edema. R chest tube in place.  Abdomen: Soft, NT, ND, no HSM. No bruits or masses. +BS  Neurologic: Alert and oriented x 3.  Psych: Normal affect. Extremities: No clubbing or cyanosis. RUE PICC  TELEMETRY: Reviewed, A fib 100-110s  ASSESSMENT AND PLAN: 65 y/o ?with a h/o severe AS, LV dysfxn, HTN, noncompliance, thyroid nodules, pericardial effusion, cholelithiasis, and persistent AF on eliquis, who presentedto clinic on 08/23/16 with volume overload and was admitted for further work up of AS and CHF.  1. Acute on chronic systolic CHF: EF 16-10% in setting of severe aortic stenosis.  May be due to severe aortic stenosis given lack of flow-limiting CAD.  This admission, he was initially hypotensive with SBP in 80s though BP has improved to 100s-110s range.  He was also markedly volume overloaded. PICC was placed, low output confirmed by co-ox 47%.  Milrinone 0.25 started then increased to 0.375, now down to 0.25.  Repeat echo post-TAVR with EF up to 45%.  Hypotensive overnight with large right bloody pleural effusion => now drained with chest tube.  Still hypotensive and on norepinephrine 30.  CVP 1.  WBCs higher (?stress response) but no fever.   - Off Lasix now.    - Stop spironolactone, digoxin, and lisinopril.  - Continue milrinone.  - norepinephrine to keep BP up.  - Giving 1 unit PRBCs, may need more IV fluid.  - Given persistent hypotension after drainage of pleural effusion, will get limited echo to rule out pericardial effusion.   2. Aortic stenosis: Severe AS. 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 McAlhany and [place]. - Bioprosthetic valve stable yesterday though mean gradient a bit high at 20 mmHg.  Will recheck today.  3. AKI on CKD stage III: Baseline creatinine 1.7-1.8. Increased from 1.2 =>  2.2 in setting of hypotension, suspect AKI. Given back volume and blood today.   4. Atrial fibrillation: Persistent, with RVR this admission.  He has been in atrial fibrillation at least since 6/17.  No prior DCCV.  Not sure how long prior to 6/17 he was in atrial fibrillation.  - Continue amiodarone gtt.    - He had developing thrombus on TEE => however, heparin now held with bleeding into his right chest.  Will need to restart eventually now that he has chest tube.  - With LAA thrombus despite Eliquis, will use Coumadin for home. Eventual goal 2.5 - 3.0. Dosing per pharmacy - Will not be able to do DCCV at this point with atrial thrombus.  5. Pericardial effusion: Moderate to large, no tamponade.  s/p pericardiocentesis with Dr Clifton James 10/20. Pericardial drain removed 09/05/16.  Repeat echo for pericardial effusion today.  6. Cirrhosis: Noted on prior abdominal CT, LFTs improved with diuresis.  7. CAD:  LHC with moderate coronary disease, nothing flow-limiting.  - Continue statin and 81 mg aspirin.   45 minutes critical care time  Marca Ancona 09/06/2016

## 2016-09-07 NOTE — Progress Notes (Signed)
Pt BP reading much higher in R leg than in L arm. Plan to address with oncoming shift and treatment team. Will continue to monitor closely.  Herma Ard, RN

## 2016-09-07 NOTE — Progress Notes (Addendum)
[facility] BRIEF SICU PROGRESS NOTE  2 Days Post-Op  S/P Procedure(s) (LRB): TRANSCATHETER AORTIC VALVE REPLACEMENT, TRANSFEMORAL (N/A) TRANSESOPHAGEAL ECHOCARDIOGRAM (TEE) (N/A) CHEST TUBE INSERTION (Right)   Feels okay.  No complaints AF w/ HR 100 and MAP 70 on fairly high dose levophed, mixed venous co-ox 87%, CVP remains low Breathing comfortably w/ O2 sats 96-98% on 2 L/min Minimal UOP and creatinine up to 3.1 Hgb 10.2  Plan: Continue to challenge with volume  Purcell Nails, MD 09/07/2016 6:25 PM

## 2016-09-07 NOTE — Progress Notes (Signed)
SUBJECTIVE: Nausea overnight and this am. Hypotension overnight. No chest pain.  Tele: atrial fib  BP (!) 95/47   Pulse (!) 126   Temp 98 F (36.7 C) (Oral)   Resp 14   Ht 5\' 8"  (1.727 m)   Wt 178 lb 12.7 oz (81.1 kg)   SpO2 94%   BMI 27.19 kg/m   Intake/Output Summary (Last 24 hours) at 09/07/16 0720 Last data filed at 09/07/16 0700  Gross per 24 hour  Intake          2079.12 ml  Output              400 ml  Net          1679.12 ml    PHYSICAL EXAM General: Well developed, well nourished, ill appearing. Alert and oriented x 3.  Psych:  Good affect, responds appropriately Neck: No JVD. No masses noted.  Lungs: Decreased breath sounds right lung. Marland Kitchen.  Heart: irreg irreg tachy with no murmurs noted. Abdomen: Bowel sounds are present. Soft, non-tender.  Extremities: No lower extremity edema.   LABS: Basic Metabolic Panel:  Recent Labs  78/29/5610/25/17 0357 09/07/16 0300  NA 135 129*  K 3.5 4.0  CL 99* 94*  CO2 27 26  GLUCOSE 98 251*  BUN 12 18  CREATININE 1.22 2.18*  CALCIUM 8.2* 7.9*  MG 1.7  --    CBC:  Recent Labs  09/06/16 0357 09/07/16 0300  WBC 7.3 15.3*  HGB 11.2* 9.2*  HCT 35.5* 29.7*  MCV 81.4 81.4  PLT 135* 171   Current Meds: . acetaminophen  1,000 mg Oral Q6H   Or  . acetaminophen (TYLENOL) oral liquid 160 mg/5 mL  1,000 mg Per Tube Q6H  . acetaminophen (TYLENOL) oral liquid 160 mg/5 mL  650 mg Per Tube Once   Or  . acetaminophen  650 mg Rectal Once  . aspirin  81 mg Oral Daily  . atorvastatin  40 mg Oral q1800  . cefUROXime (ZINACEF)  IV  1.5 g Intravenous Q12H  . digoxin  0.125 mg Oral Daily  . mouth rinse  15 mL Mouth Rinse BID  . pantoprazole  40 mg Oral Daily  . polyethylene glycol  17 g Oral Daily  . sodium chloride flush  10-40 mL Intracatheter Q12H  . sodium chloride flush  3 mL Intravenous Q12H  . spironolactone  25 mg Oral Daily  . Warfarin - Pharmacist Dosing Inpatient   Does not apply q1800   Echo 09/06/16: Left  ventricle: Overall LVF appears improved with EF at least   45%. Endocardial segments are not well visualized in all views   but appears to be apical inferior akinesis. Recommend limited   echo with definity contrast for better assessment. The cavity   size was normal. There was moderate concentric hypertrophy. There   is akinesis of the apicalinferior myocardium. Doppler parameters   are consistent with high ventricular filling pressure. - Ventricular septum: Septal motion showed moderate paradox. These   changes are consistent with intraventricular conduction delay. - Aortic valve: S/P TAVR with normally functioning bioprosthetic   AVR. The mean AV gradient is elevated at 20mmHg. There is a   trivial perivalvular leak. A bioprosthesis was present. There was   moderate stenosis. Valve area (VTI): 1.22 cm^2. Valve area   (Vmax): 1.17 cm^2. Valve area (Vmean): 1.22 cm^2. - Mitral valve: Moderately calcified annulus. - Atrial septum: There was increased thickness of the septum,   consistent  with lipomatous hypertrophy. - Pulmonary arteries: PA peak pressure: 37 mm Hg (S).  Impressions:  - The right ventricular systolic pressure was increased consistent   with mild pulmonary hypertension.  ASSESSMENT AND PLAN:   1. Severe aortic valve stenosis: POD #2 s/p TAVR. Hypotension overnight and felt to be hypovolemic by the coverage team. He is on Levophed this am. Lasix on hold. 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 Coumadin has been started. Echo with improvement in LVEF, now estimated at 45%. Will continue ASA.   2. Systolic CHF/non-ischemic cardiomyopathy/shock:Pt hypotensive overnight. He has been gently hydrated. He is on Levophed this am. He is asymptomatic. He is being followed by the CHF team.     3. Pleural effusion: Chest tube out yesterday. Now with white out of right lung. Will need to ask CT surgery team to address today.   Verne Carrow  10/26/20177:20 AM

## 2016-09-07 NOTE — Progress Notes (Signed)
Chest Tube Insertion Procedure Note  Indications:  Clinically significant rightHemothorax  Pre-operative Diagnosis: right Hemothorax  Post-operative Diagnosis: right Hemothorax  Procedure Details  Informed consent was obtained for the procedure, including sedation.  Risks of lung perforation, hemorrhage, arrhythmia, and adverse drug reaction were discussed.   After sterile skin prep, using standard technique, a 28 French tube was placed in the right lateral 6th rib space.  Findings: 1100 ml of dark venous-looking bloody fluid obtained  Estimated Blood Loss:  Minimal         Specimens:  None              Complications:  None; patient tolerated the procedure well.         Disposition: pt is in SICU         Condition: stable  Attending Attestation: I performed the procedure.

## 2016-09-07 NOTE — Progress Notes (Addendum)
Notified MD of pt BP of 73/51, MAP 59, CVP 2. Maxed out on levophed @ 20 mcg/min. Pt still mentating appropriately. Orders received for 125 cc bolus. Will implement and continue to monitor.  Herma Ard, RN

## 2016-09-08 ENCOUNTER — Inpatient Hospital Stay (HOSPITAL_COMMUNITY): Payer: Commercial Managed Care - HMO

## 2016-09-08 ENCOUNTER — Other Ambulatory Visit: Payer: Self-pay

## 2016-09-08 DIAGNOSIS — R739 Hyperglycemia, unspecified: Secondary | ICD-10-CM

## 2016-09-08 DIAGNOSIS — I350 Nonrheumatic aortic (valve) stenosis: Secondary | ICD-10-CM

## 2016-09-08 DIAGNOSIS — R6521 Severe sepsis with septic shock: Secondary | ICD-10-CM

## 2016-09-08 DIAGNOSIS — A419 Sepsis, unspecified organism: Secondary | ICD-10-CM

## 2016-09-08 DIAGNOSIS — Z954 Presence of other heart-valve replacement: Secondary | ICD-10-CM

## 2016-09-08 LAB — GLUCOSE, CAPILLARY
GLUCOSE-CAPILLARY: 124 mg/dL — AB (ref 65–99)
GLUCOSE-CAPILLARY: 189 mg/dL — AB (ref 65–99)
Glucose-Capillary: 126 mg/dL — ABNORMAL HIGH (ref 65–99)
Glucose-Capillary: 135 mg/dL — ABNORMAL HIGH (ref 65–99)

## 2016-09-08 LAB — BASIC METABOLIC PANEL
Anion gap: 14 (ref 5–15)
BUN: 29 mg/dL — AB (ref 6–20)
CO2: 21 mmol/L — ABNORMAL LOW (ref 22–32)
CREATININE: 3.71 mg/dL — AB (ref 0.61–1.24)
Calcium: 8.3 mg/dL — ABNORMAL LOW (ref 8.9–10.3)
Chloride: 93 mmol/L — ABNORMAL LOW (ref 101–111)
GFR calc Af Amer: 18 mL/min — ABNORMAL LOW (ref >60)
GFR, EST NON AFRICAN AMERICAN: 16 mL/min — AB (ref >60)
Glucose, Bld: 135 mg/dL — ABNORMAL HIGH (ref 65–99)
Potassium: 4.7 mmol/L (ref 3.5–5.1)
SODIUM: 128 mmol/L — AB (ref 135–145)

## 2016-09-08 LAB — HEPATIC FUNCTION PANEL
ALK PHOS: 102 U/L (ref 38–126)
ALT: 22 U/L (ref 17–63)
AST: 31 U/L (ref 15–41)
Albumin: 2.4 g/dL — ABNORMAL LOW (ref 3.5–5.0)
BILIRUBIN DIRECT: 1.3 mg/dL — AB (ref 0.1–0.5)
BILIRUBIN TOTAL: 2.4 mg/dL — AB (ref 0.3–1.2)
Indirect Bilirubin: 1.1 mg/dL — ABNORMAL HIGH (ref 0.3–0.9)
Total Protein: 5.7 g/dL — ABNORMAL LOW (ref 6.5–8.1)

## 2016-09-08 LAB — TYPE AND SCREEN
ABO/RH(D): B NEG
ANTIBODY SCREEN: NEGATIVE
UNIT DIVISION: 0
Unit division: 0

## 2016-09-08 LAB — PROTIME-INR
INR: 1.88
PROTHROMBIN TIME: 21.9 s — AB (ref 11.4–15.2)

## 2016-09-08 LAB — CORTISOL: Cortisol, Plasma: 22.7 ug/dL

## 2016-09-08 LAB — CBC
HCT: 34.5 % — ABNORMAL LOW (ref 39.0–52.0)
Hemoglobin: 11.2 g/dL — ABNORMAL LOW (ref 13.0–17.0)
MCH: 26.1 pg (ref 26.0–34.0)
MCHC: 32.5 g/dL (ref 30.0–36.0)
MCV: 80.4 fL (ref 78.0–100.0)
PLATELETS: 186 10*3/uL (ref 150–400)
RBC: 4.29 MIL/uL (ref 4.22–5.81)
RDW: 16.9 % — AB (ref 11.5–15.5)
WBC: 20.9 10*3/uL — ABNORMAL HIGH (ref 4.0–10.5)

## 2016-09-08 LAB — TSH: TSH: 2.367 u[IU]/mL (ref 0.350–4.500)

## 2016-09-08 LAB — T4, FREE: Free T4: 1.59 ng/dL — ABNORMAL HIGH (ref 0.61–1.12)

## 2016-09-08 LAB — PROCALCITONIN: Procalcitonin: 4.63 ng/mL

## 2016-09-08 MED ORDER — ALTEPLASE 2 MG IJ SOLR
2.0000 mg | Freq: Once | INTRAMUSCULAR | Status: AC
  Administered 2016-09-08: 2 mg
  Filled 2016-09-08: qty 2

## 2016-09-08 MED ORDER — SODIUM CHLORIDE 0.9 % IV SOLN: INTRAVENOUS | Status: DC | PRN

## 2016-09-08 MED ORDER — SODIUM CHLORIDE 0.9 % IV SOLN
INTRAVENOUS | Status: AC
  Administered 2016-09-08: 08:00:00 via INTRAVENOUS

## 2016-09-08 MED ORDER — SODIUM CHLORIDE 0.9 % IV SOLN
0.0400 [IU]/min | INTRAVENOUS | Status: DC
  Administered 2016-09-08: 0.03 [IU]/min via INTRAVENOUS
  Administered 2016-09-09 (×2): 0.04 [IU]/min via INTRAVENOUS
  Filled 2016-09-08 (×2): qty 2

## 2016-09-08 MED ORDER — PIPERACILLIN-TAZOBACTAM 3.375 G IVPB
3.3750 g | Freq: Three times a day (TID) | INTRAVENOUS | Status: AC
  Administered 2016-09-08 – 2016-09-15 (×23): 3.375 g via INTRAVENOUS
  Filled 2016-09-08 (×25): qty 50

## 2016-09-08 MED ORDER — VANCOMYCIN HCL IN DEXTROSE 1-5 GM/200ML-% IV SOLN
1000.0000 mg | INTRAVENOUS | Status: DC
  Administered 2016-09-08 – 2016-09-10 (×2): 1000 mg via INTRAVENOUS
  Filled 2016-09-08 (×2): qty 200

## 2016-09-08 MED FILL — Dextrose Inj 5%: INTRAVENOUS | Qty: 250 | Status: AC

## 2016-09-08 MED FILL — Phenylephrine HCl Inj 10 MG/ML: INTRAMUSCULAR | Qty: 2 | Status: AC

## 2016-09-08 NOTE — Progress Notes (Addendum)
Patient ID: Lucas Munoz, male   DOB: 1951/04/14, 65 y.o.   MRN: [id number]    SUBJECTIVE:   PICC placed , initial co-ox on 10/14 47%.  He was started on milrinone. Milrinone increased to 0.375 mcg 08/29/16 for low mixed venous sat on RHC (CI 1.9).  Underwent pericardiocentesis on 10/20 with 775cc out. Fluid transudative. Drained another 225 cc yesterday  TEE 10/23 confirmed amorphous material in LA appendage, appears to be forming thrombus.  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   Post-TAVR echo 10/25 with EF up to 45%, moderate LVH, bioprosthetic AVR with mean gradient 20 (higher than expected), PASP 37, normal RV, no pericardial effusion.   Overnight 10/26 developed nausea/vomiting and hypotension.  He was started on norepinephrine and IV fluid with low CVP. CXR showed large right pleural effusion accumulation. Chest tube replaced on right, 1300 cc bloody fluid out.  Heparin stopped. He remained hypotensive.  1 unit PRBCs given. Also noted rise in creatinine with poor UOP.  Echo 10/26 with EF about 45%, small pericardial effusion with no tamponade.   Today, SBP in 90s but on 50 norepinephrine.  He is afebrile but WBCs to 21.  CXR this morning possibly with some consolidation on right, chest tube remains in place. Minimal UOP, creatinine to 3.71.  CVP is about 8 this morning, getting IV fluid.    RHC/LHC (08/29/16) Left Main  Mild ostial narrowing.  Left Anterior Descending  30% proximal LAD stenosis. 60% distal LAD stenosis. Moderate D1 without significant disease. Small to moderate D2 with long 70% ostial stenosis.  Left Circumflex  50% proximal LCx stenosis at OM1. Moderate OM1 with 30% ostial stenosis.  Right Coronary Artery  40% proximal and 40% mid RCA stenosis.   RA mean 19 RV 65/15 PA 69/34, mean 51 PCWP mean 43 (not sure we got an accurate PCWP).  AO 106/87 Oxygen saturations: PA 46% AO 92% Cardiac Output (Fick) 3.8  Cardiac Index  (Fick) 1.9  Echo (10/14): EF 15%, severe AS, moderately decreased RV systolic function, PASP 65 mmHg, moderate to large pericardial effusion without tamponade.   Scheduled Meds: . sodium chloride   Intravenous Once  . sodium chloride   Intravenous Once  . acetaminophen  1,000 mg Oral Q6H   Or  . acetaminophen (TYLENOL) oral liquid 160 mg/5 mL  1,000 mg Per Tube Q6H  . acetaminophen (TYLENOL) oral liquid 160 mg/5 mL  650 mg Per Tube Once   Or  . acetaminophen  650 mg Rectal Once  . alteplase  2 mg Intracatheter Once  . aspirin  81 mg Oral Daily  . atorvastatin  40 mg Oral q1800  . mouth rinse  15 mL Mouth Rinse BID  . pantoprazole  40 mg Oral Daily  . polyethylene glycol  17 g Oral Daily  . sodium chloride flush  10-40 mL Intracatheter Q12H  . sodium chloride flush  3 mL Intravenous Q12H   Continuous Infusions: . sodium chloride    . amiodarone 30 mg/hr (09/08/16 0700)  . milrinone 0.25 mcg/kg/min (09/08/16 0700)  . norepinephrine (LEVOPHED) Adult infusion 50 mcg/min (09/08/16 0700)   PRN Meds:.sodium chloride, Place/Maintain arterial line **AND** sodium chloride, acetaminophen, alum & mag hydroxide-simeth, docusate sodium, lactulose, morphine injection, ondansetron (ZOFRAN) IV, ondansetron (ZOFRAN) IV, oxyCODONE, oxyCODONE-acetaminophen, sodium chloride flush, sodium chloride flush, traMADol, traZODone, white petrolatum   Vitals:   09/08/16 0645 09/08/16 0700 09/08/16 0715 09/08/16 0730  BP: (!) 90/44 (!) 90/51 (!) 95/51 Marland Kitchen)  91/53  Pulse: (!) 136 (!) 138 (!) 115 (!) 119  Resp: 12 17 13 18   Temp:      TempSrc:      SpO2: 92% 92% 92% 93%  Weight:      Height:        Intake/Output Summary (Last 24 hours) at 09/08/16 0800 Last data filed at 09/08/16 0700  Gross per 24 hour  Intake           3851.6 ml  Output             1580 ml  Net           2271.6 ml    LABS: Basic Metabolic Panel:  Recent Labs  16/10/96 0357  09/07/16 1537 09/08/16 0230  NA 135  < > 129* 128*   K 3.5  < > 4.3 4.7  CL 99*  < > 94* 93*  CO2 27  < > 22 21*  GLUCOSE 98  < > 143* 135*  BUN 12  < > 23* 29*  CREATININE 1.22  < > 3.09* 3.71*  CALCIUM 8.2*  < > 8.4* 8.3*  MG 1.7  --   --   --   < > = values in this interval not displayed. Liver Function Tests:  Recent Labs  09/08/16 0230  AST 31  ALT 22  ALKPHOS 102  BILITOT 2.4*  PROT 5.7*  ALBUMIN 2.4*   No results for input(s): LIPASE, AMYLASE in the last 72 hours. CBC:  Recent Labs  09/07/16 1537 09/08/16 0230  WBC 21.0* 20.9*  HGB 10.2* 11.2*  HCT 31.8* 34.5*  MCV 80.7 80.4  PLT 168 186   Cardiac Enzymes: No results for input(s): CKTOTAL, CKMB, CKMBINDEX, TROPONINI in the last 72 hours. BNP: Invalid input(s): POCBNP D-Dimer: No results for input(s): DDIMER in the last 72 hours. Hemoglobin A1C: No results for input(s): HGBA1C in the last 72 hours. Fasting Lipid Panel: No results for input(s): CHOL, HDL, LDLCALC, TRIG, CHOLHDL, LDLDIRECT in the last 72 hours. Thyroid Function Tests: No results for input(s): TSH, T4TOTAL, T3FREE, THYROIDAB in the last 72 hours.  Invalid input(s): FREET3 Anemia Panel: No results for input(s): VITAMINB12, FOLATE, FERRITIN, TIBC, IRON, RETICCTPCT in the last 72 hours.  RADIOLOGY: Ct Cardiac Morph/pulm Vein W/cm&w/o Ca Score  Addendum Date: 09/01/2016   ADDENDUM REPORT: 09/01/2016 17:18 CLINICAL DATA:  Aortic stenosis EXAM: Cardiac TAVR CT TECHNIQUE: The patient was scanned on a Philips 256 scanner. A 120 kV retrospective scan was triggered in the descending thoracic aorta at 111 HU's. Gantry rotation speed was 270 msecs and collimation was .9 mm. No beta blockade or nitro were given. The 3D data set was reconstructed in 5% intervals of the R-R cycle. Systolic and diastolic phases were analyzed on a dedicated work station using MPR, MIP and VRT modes. The patient received 80 cc of contrast. 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 Heavily  calcified Aorta:  Aortic root dilated Sinotubular Junction:  32 mm Ascending Thoracic Aorta:  43 mm Aortic Arch:  30 mm Descending Thoracic Aorta:  26 mm Sinus of Valsalva Measurements: Non-coronary:  33 mm Right -coronary:  31 mm Left -coronary:  32 mm Coronary Artery Height above Annulus: Left Main:  19 mm above annulus Right Coronary:  10 mm above annulus Virtual Basal Annulus Measurements: Maximum/Minimum Diameter:  27.1 x 20.3 mm Perimeter:  78 mm Area:  442 mm2 Coronary Arteries:  Sufficient height above annulus for deployment  Optimum Fluoroscopic Angle for Delivery:  LAO 29 degrees IMPRESSION: 1) functionally bicuspid AV heavily calcified with annulus suitable for 26 mm Sapien 3 valve 2) Moderate aortic root enlargement 4.3 cm 3) Large pericardial effusion patient to have pericardiocentesis post CT 4) Cannot r/o LAA thrombus suggest TEE correlation 5) Optimum angiographic angle for deployment LAO 29 degrees 6) Coronary heights sufficient for deployment Charlton Haws Electronically Signed   By: Charlton Haws M.D.   On: 09/01/2016 17:18   Result Date: 09/01/2016 EXAM: OVER-READ INTERPRETATION  CT CHEST The following report is an over-read performed by radiologist Dr. [facility] on 09/01/2016. This over-read does not include interpretation of cardiac or coronary anatomy or pathology. The coronary calcium score/coronary CTA interpretation by the cardiologist is attached. COMPARISON:  Chest CT 05/01/2016. FINDINGS: Large pericardial effusion. No pericardial calcification. Aortic atherosclerosis with aneurysmal dilatation of the ascending thoracic aorta which measures up to 4.8 cm in diameter (unchanged). Large right-sided pleural effusion layering dependently. Extensive passive atelectasis in the right lower lobe. Tiny calcified granuloma in the periphery of the right lower lobe. 4 mm subpleural noncalcified pulmonary nodule in the left lower lobe abutting the major fissure (image  71 of series 512), unchanged compared to prior study 05/01/2016, favored to represent a subpleural lymph node. No other suspicious appearing pulmonary nodules or masses are noted in the visualized portions of the thorax. Partially calcified pleural plaque in the right hemithorax (image 31 of series 513) again noted. No calcified pleural plaques in the left hemithorax. Visualized portions of the upper abdomen demonstrate a nodular contour of the liver, suggestive of underlying cirrhosis. No aggressive appearing lytic or blastic lesions are noted in the visualized portions of the skeleton. Right upper extremity PICC with tip terminating in the distal superior vena cava. IMPRESSION: 1. Interval increased size of what is now a large pericardial effusion. No associated pericardial calcification. 2. Enlargement of a large layering right-sided pleural effusion. This is associated with extensive passive atelectasis in the right lower lobe. 3. Aortic atherosclerosis, with similar aneurysmal dilatation of the ascending thoracic aorta which measures up to 4.8 cm in diameter. 4. Morphologic changes in the liver indicative of cirrhosis, as above. 5. 4 mm subpleural nodule in the periphery of the left lower lobe is nonspecific, but statistically likely a subpleural lymph node. No follow-up needed if patient is low-risk. Non-contrast chest CT can be considered in 12 months if patient is high-risk. This recommendation follows the consensus statement: Guidelines for Management of Incidental Pulmonary Nodules Detected on CT Images: From the Fleischner Society 2017; Radiology 2017; 284:228-243. 6. Additional incidental findings, as above. Electronically Signed: By: Trudie Reed M.D. On: 09/01/2016 14:38   Dg Chest Port 1 View  Result Date: 09/07/2016 CLINICAL DATA:  Hemothorax with new right chest tube. EXAM: PORTABLE CHEST 1 VIEW COMPARISON:  Earlier today FINDINGS: Decreased but still extensive right hemothorax after chest  tube placement. No visualized gas component. Right upper extremity PICC with tip at the SVC level. Unchanged cardiopericardial enlargement. Status post transcatheter aortic valve replacement. IMPRESSION: 1. Right chest tube placement with diminished hemothorax. 2. Elsewhere stable chest. Electronically Signed   By: Marnee Spring M.D.   On: 09/07/2016 09:05   Dg Chest Port 1 View  Result Date: 09/07/2016 CLINICAL DATA:  Vomiting EXAM: PORTABLE CHEST 1 VIEW COMPARISON:  Yesterday FINDINGS: Stable right upper extremity PICC. Swan-Ganz catheter and right jugular introducer have been removed. Right chest tube is been removed. A large right pleural  effusion has accumulated. There is compressive atelectasis within much of the right lung. Vascular congestion. Left lung is otherwise clear. Cardiomegaly. Small right pneumothorax is suspected. IMPRESSION: Right chest tube removed. Large right pleural effusion has reaccumulated. Very small right apical pneumothorax has also developed. Electronically Signed   By: Jolaine Click M.D.   On: 09/07/2016 07:39   Dg Chest Port 1 View  Result Date: 09/06/2016 CLINICAL DATA:  Cardiac surgery EXAM: PORTABLE CHEST 1 VIEW COMPARISON:  Yesterday FINDINGS: Right upper extremity PICC, right chest tube, right jugular introducer, right Swan-Ganz catheter are stable. There is no pneumothorax. Small right pleural effusion is stable. Vascular congestion and mild edema are worse. Lower lung volumes. Cardiac silhouette remains markedly enlarged. IMPRESSION: Worsening vascular congestion and edema. No pneumothorax. Electronically Signed   By: Jolaine Click M.D.   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 Postoperative image. EXAM: PORTABLE CHEST 1 VIEW COMPARISON:  CT chest 09/01/2016 and single view of the chest 08/26/2016. FINDINGS: Endotracheal tube is in place with tip in good position at the  level of the clavicular heads. Right IJ approach Swan-Ganz catheter tip is in the proximal right main pulmonary artery. Tip of right PICC projects in the lower superior vena cava. NG tube courses into the stomach. Right chest tube is identified. There is a small right pleural effusion and basilar atelectasis, improved since the prior chest film. The lungs are otherwise clear. The cardiopericardial silhouette is enlarged. No pneumothorax. IMPRESSION: Support tubes and lines projecting good position. Negative for pneumothorax. Decreased small right pleural effusion and basilar atelectasis. Enlarged cardiopericardial silhouette consistent with cardiomegaly and pericardial effusion as seen on CT scan. Electronically Signed   By: Drusilla Kanner M.D.   On: 09/05/2016 13:56   Dg Chest Port 1 View  Result Date: 08/26/2016 CLINICAL DATA:  65 year old male with a history of shortness of breath EXAM: PORTABLE CHEST 1 VIEW COMPARISON:  08/25/2016, CT chest 05/01/2016 FINDINGS: Compare to the prior plain film there is unchanged size of the cardiac silhouette. Calcifications of the aortic arch. Interval placement of right upper extremity PICC with the tip appearing to terminate at the superior cavoatrial junction. Increasing opacity at the right base with thickening of the minor fissure, obscuration the right hemidiaphragm and right heart border. Interlobular septal thickening. IMPRESSION: Evidence of CHF with increasing right-sided pleural effusion/atelectasis. Unchanged cardiac silhouette in this patient with a history of pericardial effusion imaged on prior CT 05/01/2016. Interval placement of right upper extremity PICC. Aortic atherosclerosis. Signed, Yvone Neu. Loreta Ave, DO [facility] Electronically Signed   By: Gilmer Mor D.O.   On: 08/26/2016 09:37   Dg Chest Port 1 View  Result Date: 08/25/2016 CLINICAL DATA:  Acute on chronic systolic congestive heart  failure. Dilated cardiomyopathy. Acute kidney injury. EXAM: PORTABLE CHEST 1 VIEW COMPARISON:  None. FINDINGS: Marked cardiac enlargement noted. Diffuse interstitial infiltrates consistent with mild interstitial edema. Small right pleural effusion also seen with right basilar atelectasis. IMPRESSION: Mild congestive heart failure, with small right pleural effusion and right basilar atelectasis. Electronically Signed   By: Myles Rosenthal M.D.   On: 08/25/2016 15:14   Ct Angio Chest/abd/pel For Dissection W And/or W/wo  Result Date: 09/03/2016 CLINICAL DATA:  65 year old male with history of severe aortic stenosis. Preprocedural study prior to potential transcatheter aortic valve replacement. Recent history of pericardiocentesis. EXAM: CT ANGIOGRAPHY CHEST, ABDOMEN  AND PELVIS TECHNIQUE: Multidetector CT imaging through the chest, abdomen and pelvis was performed using the standard protocol during bolus administration of intravenous contrast. Multiplanar reconstructed images and MIPs were obtained and reviewed to evaluate the vascular anatomy. CONTRAST:  80 mL of Isovue 370. COMPARISON:  Cardiac gated CTA 09/01/2016. FINDINGS: CTA CHEST FINDINGS Cardiovascular: Heart size is mildly enlarged. Small amount of residual pericardial fluid and/or thickening, decreased compared to the prior study. Pericardiocentesis catheter in place with tip terminating high in a superior pericardial recess adjacent to the aortic arch. No pericardial calcification. Severe thickening calcification of the aortic valve, which has an appearance suggestive of a bicuspid valve. There is aortic atherosclerosis, as well as atherosclerosis of the great vessels of the mediastinum and the coronary arteries, including calcified atherosclerotic plaque in the left anterior descending and right coronary arteries. 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 Large filling defect within the left atrial appendage, concerning  for potential left atrial appendage thrombus. Right upper extremity PICC with tip terminating at the superior cavoatrial junction. Mediastinum/Lymph Nodes: Several borderline enlarged and minimally enlarged mediastinal lymph nodes measuring up to 14 mm in short axis, slightly increased compared to the prior study, likely reactive. Esophagus is unremarkable in appearance. No axillary lymphadenopathy. Lungs/Pleura: Large right pleural effusion again noted, lying dependently. This is associated with near complete passive atelectasis of the right lower lobe. Trace left pleural effusion with minimal passive subsegmental atelectasis in the dependent left lower lobe. No acute consolidative airspace disease. Previously noted tiny subpleural known nodule in the anterior aspect of the left lower lobe is no longer noted, presumably a reactive subpleural lymph node on the prior examination. Partially calcified small right-sided pleural plaque is unchanged (image 63 of series 5). No other calcified pleural plaques are identified. Tiny calcified granulomas are again noted in the periphery of the right lower lobe, now with an atelectatic lung. Musculoskeletal/Soft Tissues: There are no aggressive appearing lytic or blastic lesions noted in the visualized portions of the skeleton. CTA ABDOMEN AND PELVIS FINDINGS Hepatobiliary: Liver has a shrunken appearance and nodular contour, compatible with underlying cirrhosis. No discrete cystic or solid hepatic lesions. No intra or extrahepatic biliary ductal dilatation. Large rim calcified gallstone in the gallbladder measuring up to 3.8 cm in diameter. No current findings to suggest an acute cholecystitis at this time. Pancreas: No pancreatic mass or peripancreatic inflammatory changes. No pancreatic ductal dilatation. Pancreatic atrophy. No pancreatic or peripancreatic fluid. Spleen: Unremarkable. Adrenals/Urinary Tract: Bilateral kidneys and bilateral adrenal glands are unremarkable in  appearance. No hydroureteronephrosis. In the dependent portion of the urinary bladder there is a 12 mm calculus (image 254 of series 5). Urinary bladder is otherwise unremarkable in appearance. Stomach/Bowel: Normal appearance of the stomach. No pathologic dilatation of small bowel or colon. Numerous colonic diverticulae are noted, without surrounding inflammatory changes to suggest an acute diverticulitis at this time. The appendix is not confidently identified and may be surgically absent. Regardless, there are no inflammatory changes noted adjacent to the cecum to suggest the presence of an acute appendicitis at this time. Vascular/Lymphatic: Aortic atherosclerosis with vascular findings and measurements pertinent to potential TAVR procedure, as detailed below. No aneurysm or dissection identified in the abdominal or pelvic vasculature. Celiac axis, superior mesenteric artery and inferior mesenteric artery are all widely patent without hemodynamically significant stenosis. Single renal arteries bilaterally are widely patent. Portal vein is mildly dilated measuring 16 mm in the porta hepatis. Reproductive: Prostate gland and seminal vesicles  are unremarkable in appearance. Other: Small left inguinal hernia containing a short segment of small bowel. No significant volume of ascites. No pneumoperitoneum. Musculoskeletal: There are no aggressive appearing lytic or blastic lesions noted in the visualized portions of the skeleton. VASCULAR MEASUREMENTS PERTINENT TO TAVR: AORTA: Minimal Aortic Diameter -  18 x 14 mm Severity of Aortic Calcification -  moderate RIGHT PELVIS: Right Common Iliac Artery - Minimal Diameter - 11.0 x 9.1 mm Tortuosity - moderate Calcification - mild to moderate Right External Iliac Artery - Minimal Diameter - 8.7 x 8.9 mm Tortuosity - moderate to severe Calcification - none Right Common Femoral Artery - Minimal Diameter - 9.2 x 6.8 mm Tortuosity - mild Calcification - mild LEFT PELVIS: Left  Common Iliac Artery - Minimal Diameter - 9.5 x 10.0 mm Tortuosity - severe Calcification - none Left External Iliac Artery - Minimal Diameter - 8.1 x 7.9 mm Tortuosity - moderate Calcification - none Left Common Femoral Artery - Minimal Diameter - 8.5 x 6.3 mm Tortuosity - mild Calcification - mild Review of the MIP images confirms the above findings. IMPRESSION: 1. Vascular findings and measurements pertinent to potential TAVR procedure, as detailed above. This patient appears to have suitable pelvic arterial access, however, this access is most optimal on the right side secondary to the lesser degree of tortuosity. Additionally, there is a small left inguinal hernia containing a short segment of small bowel which comes in close proximity to the left common femoral artery. 2. Severe thickening calcification of what appears to be a bicuspid aortic valve, compatible with the reported clinical history of severe aortic stenosis. 3. Interval pericardiocentesis with persistent indwelling pericardiocentesis catheter, with significant decreased size of what is now only a small amount of residual pericardial fluid and/or thickening. 4. Large filling defect in the tip of the left atrial appendage, concerning for potential left atrial appendage thrombus (although this could simply reflect pseudo thrombus secondary to contrast bolus timing). Correlation with TEE is recommended, if not already performed. 5. Aortic atherosclerosis, in addition to 2 vessel coronary artery disease. Please note that although the presence of coronary artery calcium documents the presence of coronary artery disease, the severity of this disease and any potential stenosis cannot be assessed on this non-gated CT examination. Assessment for potential risk factor modification, dietary therapy or pharmacologic therapy may be warranted, if clinically indicated. 6. Persistent large right-sided pleural effusion with extensive passive atelectasis in the right  lower lobe. Trace left pleural effusion with minimal dependent passive atelectasis in the left lower lobe. 7. Cholelithiasis without evidence of acute cholecystitis. 8. Colonic diverticulosis without evidence of acute diverticulitis at this time. 9. Morphologic changes in the liver compatible with underlying cirrhosis. 10. Additional incidental findings, as above. Electronically Signed   By: Trudie Reed M.D.   On: 09/03/2016 12:24    PHYSICAL EXAM CVP 8 General: NAD Neck: JVP 8 cm Lungs: Decreased breath sounds on right.  CV: Nondisplaced PMI.  Heart mildly tachy, irregular S1/S2, no S3/S4, 2/6 SEM RUSB.  No edema. R chest tube in place.  Abdomen: Soft, NT, ND, no HSM. No bruits or masses. +BS  Neurologic: Alert and oriented x 3.  Psych: Normal affect. Extremities: No clubbing or cyanosis. RUE PICC  TELEMETRY: Reviewed, A fib 100-110s  ASSESSMENT AND PLAN: 65 y/o ?with a h/o severe AS, LV dysfxn, HTN, noncompliance, thyroid nodules, pericardial effusion, cholelithiasis, and persistent AF on eliquis, who presentedto clinic on 08/23/16 with volume overload and was admitted for further  work up of AS and CHF.  1. Acute on chronic systolic CHF: EF 16-10% in setting of severe aortic stenosis.  May be due to severe aortic stenosis given lack of flow-limiting CAD.  This admission, he was initially hypotensive with SBP in 80s though BP has improved to 100s-110s range.  He was also markedly volume overloaded. PICC was placed, low output confirmed by co-ox 47%.  Milrinone 0.25 started then increased to 0.375, now down to 0.25.  Repeat echo post-TAVR with EF up to 45%.  Repeat limited echo 10/26 with small pericardial effusion, no tamponade.  Currently hypotensive with CVP 8.   - Off Lasix now.    - Stopped spironolactone, digoxin, and lisinopril.  - Continue milrinone.  - norepinephrine to keep BP up => need arterial line to make sure BPs are correct (question accuracy of cuff).    2. Hypotension:  ?Etiology.  Pleural effusion drained, no pericardial tamponade, EF up to 45% and valve functioning ok (mildly elevated gradient).  Co-ox has been ok.  Possible septic shock with rise in WBCs though no fever.  ?Right-sided PNA.  Will send blood cultures and procalcitonin.  Add empiric antibiotics with vancomycin/Zosyn.  CVP 8, continue IV fluid 100 cc NS for at least another liter.   3. Aortic stenosis: Severe AS. 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 McAlhany and [place]. - Bioprosthetic valve stable on post-op echo though mean gradient a bit high at 20 mmHg.  4. AKI on CKD stage III: Baseline creatinine 1.7-1.8. Increased from 1.2 => 2.2 => 3.7 in setting of hypotension, suspect AKI. Given back more volume today with CVP 8.   5. Atrial fibrillation: Persistent, with RVR this admission.  He has been in atrial fibrillation at least since 6/17.  No prior DCCV.  Not sure how long prior to 6/17 he was in atrial fibrillation.  - Continue amiodarone gtt.    - He had developing thrombus on TEE => however, heparin now held with bleeding into his right chest.   - Will need to restart anticoagulation eventually but still with significant blood via chest tube.  Will follow with surgery to determine when to restart coumadin.  With LAA thrombus despite Eliquis, will use Coumadin for home. Eventual goal 2.5 - 3.0. - Will not be able to do DCCV at this point with atrial thrombus.  6. Pericardial effusion: Moderate to large, no tamponade.  s/p pericardiocentesis with Dr Clifton James 10/20. Pericardial drain removed 09/05/16.  Repeat echo 10/27 with small effusion, no tamponade.   7. Cirrhosis: Noted on prior abdominal CT, LFTs improved with diuresis.  8. CAD:  LHC with moderate coronary disease, nothing flow-limiting.  - Continue statin and 81 mg aspirin.   45 minutes critical care time  Marca Ancona 09/06/2016

## 2016-09-08 NOTE — Consult Note (Signed)
PULMONARY / CRITICAL CARE MEDICINE   Name: Lucas Munoz MRN: [id number] DOB: April 25, 1951    ADMISSION DATE:  08/24/2016 CONSULTATION DATE:  09/08/2016  REFERRING MD:  Marca Ancona, M.D. / Cardiology  CHIEF COMPLAINT:  Septic Shock  HISTORY OF PRESENT ILLNESS:  65 y.o. male with history of cardiomyopathy and severe aortic stenosis. Patient underwent pericardiocentesis on 10/20 in preparation for transient aortic valve replacement. This was completed on 10/24. Postprocedure patient has chest tube removal with subsequent development of right hemothorax requiring repeat chest tube insertion. Patient developed persistent hypotension as well as acute oliguric renal failure requiring escalating doses of vasopressor support. In the setting of leukocytosis and probable underlying sepsis PCCM was consulted to assist with medical management. Patient denies any chest pain, pressure, or palpitations. He reports his dyspnea is minimal. Denies any chronic cough but did have a minimal amount of mucus production today. Denies any subjective fever, chills, or sweats. Patient did have some mild nausea earlier relieved with Zofran. Denies any abdominal pain or emesis. He is slightly constipated and without diarrhea.  PAST MEDICAL HISTORY :  Past Medical History:  Diagnosis Date  . Cardiomyopathy (HCC)    a. 04/2016 Echo: EF 20-25%.  . Cataract    right side per patient  . Cholelithiasis    a. 03/2016 Abd CT: 4.1 x 3.4 cm gallstone and other tiny stones in the gb fundus-->referred to GI.  Marland Kitchen Chronic bronchitis (HCC)   . Chronic pulmonary embolism (HCC)    a. 04/2016 CTA Chest: Sequelae of chronic pulm thromboembolism in RLL. No acute PE.  Marland Kitchen Chronic systolic CHF (congestive heart failure) (HCC)    a. 04/2016 Echo: EF 20-25%, mod LVH, diff HK w/ anterior, anteroseptal, and apical AK, sev AS (mean grad 40 mmHg, peak grad 66 mmHg), Asc Ao diameter 45 mm - ? flap (not seen on f/u CTA), mild MR, reduced RV fxn, sev dil RA,  mod TR, PASP 50 mmHg, small to mod circumferential pericardial effusion.  Marland Kitchen GERD (gastroesophageal reflux disease)   . Heart murmur dx'd 1968  . Hepatic cirrhosis (HCC)    a. 03/2016 Abd CT: evidence for hepatic cirrhosis.  . Hypertension   . Hypertensive heart disease with heart failure (HCC)   . Lower extremity edema    a. 04/2016 LE U/S: No DVT. Bilat greater saphenous vein incompetence.  . Multiple thyroid nodules    a. 05/2016 Thyroid U/S: Bilat nodules & cysts, largest in left thyroid lobe, 1.4 cm; solid nodule along the medial right thyroid lobe measuring 1.0 cm - rec u/s guided needle bx.  . Pericardial effusion    a. 03/2016 Abd CT: mod to large pericardial effusion; b. 04/2016 Echo: small to mod circumferential pericardial effusion.  . Persistent atrial fibrillation (HCC)    a. On eliquis (CHA2DS2VASc = 3).  . Pleural effusion on right    a. 03/2016 incidental finding on abd CT; b. 04/2016 R pl effusion noted on CTA Chest.  . Severe aortic stenosis     PAST SURGICAL HISTORY: Past Surgical History:  Procedure Laterality Date  . BACK SURGERY    . CARDIAC CATHETERIZATION N/A 08/29/2016   Procedure: Right/Left Heart Cath and Coronary Angiography;  Surgeon: Laurey Morale, MD;  Location: [facility] [place] INVASIVE CV LAB;  Service: Cardiovascular;  Laterality: N/A;  . CARDIAC CATHETERIZATION N/A 09/01/2016   Procedure: Pericardiocentesis;  Surgeon: Kathleene Hazel, MD;  Location: [facility] INVASIVE CV LAB;  Service: Cardiovascular;  Laterality: N/A;  . CHEST TUBE INSERTION Right  09/05/2016   Procedure: CHEST TUBE INSERTION;  Surgeon: Kathleene Hazelhristopher D McAlhany, MD;  [facility];  Service: Open Heart Surgery;  Laterality: Right;  . goiter removed  ~ 1958  . LUMBAR DISC SURGERY  1968   "herniated disc"  . TEE WITHOUT CARDIOVERSION N/A 09/05/2016   Procedure: TRANSESOPHAGEAL ECHOCARDIOGRAM (TEE);  Surgeon: Kathleene Hazelhristopher D McAlhany, MD;  [facility];  Service: Open Heart Surgery;  Laterality: N/A;   . TONSILLECTOMY  1960  . TRANSCATHETER AORTIC VALVE REPLACEMENT, TRANSFEMORAL N/A 09/05/2016   Procedure: TRANSCATHETER AORTIC VALVE REPLACEMENT, TRANSFEMORAL;  Surgeon: Kathleene Hazelhristopher D McAlhany, MD;  [facility];  Service: Open Heart Surgery;  Laterality: N/A;     Allergies  Allergen Reactions  . Potassium-Containing Compounds Other (See Comments)    Upset stomach    No current facility-administered medications on file prior to encounter.    Current Outpatient Prescriptions on File Prior to Encounter  Medication Sig  . apixaban (ELIQUIS) 5 MG TABS tablet Take 1 tablet (5 mg total) by mouth 2 (two) times daily.  . carvedilol (COREG) 6.25 MG tablet Take 1 tablet (6.25 mg total) by mouth 2 (two) times daily with a meal.  . furosemide (LASIX) 80 MG tablet Take 1/2 tablet in the AM and 1/2 tablet in the evening (Patient taking differently: Take 40 mg by mouth 2 (two) times daily. )  . potassium chloride SA (K-DUR,KLOR-CON) 20 MEQ tablet Take 1 tablet (20 mEq total) by mouth daily.    FAMILY HISTORY:  Family History  Problem Relation Age of Onset  . Cancer Mother   . Heart disease Father   . Alcohol abuse Brother   . Cancer Brother   . Cancer Maternal Grandmother   . Alcohol abuse Maternal Grandfather   . Alcohol abuse Paternal Grandfather     SOCIAL HISTORY: Social History   Social History  . Marital status: Divorced    Spouse name: N/A  . Number of children: N/A  . Years of education: N/A   Social History Main Topics  . Smoking status: Former Smoker    Packs/day: 1.00    Years: 30.00    Types: Cigarettes    Quit date: 04/05/2000  . Smokeless tobacco: Never Used  . Alcohol use 0.0 oz/week     Comment: 08/24/2016 "stopped drinking in 1990"  . Drug use: No  . Sexual activity: Not Currently   Other Topics Concern  . None   Social History Narrative  . None    REVIEW OF SYSTEMS: No headache or acute vision changes. No dysuria. Reports his urine is fairly clear.  A pertinent 14 point review of systems is negative except as per the history of presenting illness.  SUBJECTIVE: As above.  VITAL SIGNS: BP 113/67   Pulse (!) 129   Temp 97.8 F (36.6 C) (Oral)   Resp 19   Ht 5\' 8"  (1.727 m)   Wt 181 lb (82.1 kg)   SpO2 95%   BMI 27.52 kg/m   HEMODYNAMICS: CVP:  [3 mmHg-7 mmHg] 7 mmHg  VENTILATOR SETTINGS:    INTAKE / OUTPUT: I/O last 3 completed shifts: In: 5707.5 [I.V.:4237.5; Blood:670; IV Piggyback:800] Out: 1605 [Urine:105; Chest Tube:1500]  PHYSICAL EXAMINATION: General:  Awake. Alert. No acute distress. Preparing to eat lunch..  Integument:  Warm & dry. No rash on exposed skin.  Lymphatics:  No appreciated cervical or supraclavicular lymphadenoapthy. HEENT:  Moist mucus membranes. No scleral injection or icterus. Cardiovascular:  Tachycardic mildly. Trace lower extremity edema. No  appreciable JVD.  Pulmonary:  Slight crackles in bilateral lung bases but otherwise good aeration. Right chest tube in place with bloody clots within tube itself. Abdomen: Soft. Normal bowel sounds. Nondistended. Grossly nontender. Musculoskeletal:  Normal bulk and tone. Hand grip strength 5/5 bilaterally. No joint deformity or effusion appreciated. Neurological:  CN 2-12 grossly in tact. No meningismus. Moving all 4 extremities equally.  Psychiatric:  Mood and affect congruent. Speech normal rhythm, rate & tone.   LABS:  BMET  Recent Labs Lab 09/07/16 0300 09/07/16 1537 09/08/16 0230  NA 129* 129* 128*  K 4.0 4.3 4.7  CL 94* 94* 93*  CO2 26 22 21*  BUN 18 23* 29*  CREATININE 2.18* 3.09* 3.71*  GLUCOSE 251* 143* 135*    Electrolytes  Recent Labs Lab 09/06/16 0357 09/07/16 0300 09/07/16 1537 09/08/16 0230  CALCIUM 8.2* 7.9* 8.4* 8.3*  MG 1.7  --   --   --     CBC  Recent Labs Lab 09/07/16 0945 09/07/16 1537 09/08/16 0230  WBC 18.9* 21.0* 20.9*  HGB 9.3* 10.2* 11.2*  HCT 30.0* 31.8* 34.5*  PLT 157 168 186     Coag's  Recent Labs Lab 09/05/16 1400 09/07/16 0300 09/08/16 0230  APTT 30  --   --   INR 1.21 1.54 1.88    Sepsis Markers  Recent Labs Lab 09/08/16 0902  PROCALCITON 4.63    ABG  Recent Labs Lab 09/05/16 1350  PHART 7.420  PCO2ART 43.5  PO2ART 179.0*    Liver Enzymes  Recent Labs Lab 09/08/16 0230  AST 31  ALT 22  ALKPHOS 102  BILITOT 2.4*  ALBUMIN 2.4*    Cardiac Enzymes No results for input(s): TROPONINI, PROBNP in the last 168 hours.  Glucose  Recent Labs Lab 09/06/16 1934 09/07/16 0833 09/07/16 1252 09/07/16 1628 09/08/16 0807 09/08/16 1155  GLUCAP 197* 137* 110* 126* 126* 124*    Imaging Dg Chest Port 1 View  Result Date: 09/08/2016 CLINICAL DATA:  Shortness of breath.  CHF. EXAM: PORTABLE CHEST 1 VIEW COMPARISON:  09/07/2016. FINDINGS: Right PICC line and right chest tube in stable position. No pneumothorax. Right pleural effusion is improved slightly. Cardiomegaly with diffuse pulmonary interstitial prominence mild congestive heart failure cannot be excluded . IMPRESSION: 1. Right PICC line right chest tube in stable position. No pneumothorax. Right pleural effusion has improved slightly. 2. With bilateral from interstitial prominence. Mild congestive heart failure cannot be excluded . Electronically Signed   By: Maisie Fus  Register   On: 09/08/2016 08:48    STUDIES:  PFT 08/31/16:  FVC 1.16 L (40%) FEV1 1.26 L (40%) FEV1/FVC 0.75 negative bronchodilator response TTE 09/07/16 (post TAVR):  EF 50-55%. Bioprosthetic aortic valve present. RV normal in size and function. Small pericardial effusion.  MICROBIOLOGY: MRSA PCR 10/13: Negative Pericardial Effusion 10/20:  Negative MRSA PCR 10/23:  Negative Blood Ctx x2 10/27 >>  ANTIBIOTICS: Cefuroxime 10/24 for Surgical PPx Vancomycin 10/27 >> Zosyn 10/27 >>  SIGNIFICANT EVENTS: 10/12 - Admit 10/20 - Pericardiocentesis:  775cc fluid removed. Cytology & Malignancy neg. 10/24 -  Extubated post TAVR 10/26 - Chest tube reinserted by CVTS  LINES/TUBES: R Chest Tube 10/26 >> R DL PICC 70/62 >> L Radial Art Line 10/27 >> PIV x1  DISCUSSION:  65 y.o. male s/p TAVR with subsequent right hemothorax status post right-sided chest tube replacement. The patient does seem to have sepsis as a likely source for his hypotension given his repeat echocardiogram findings with improved ejection fraction.  Agree with empiric coverage with vancomycin and Zosyn for now while awaiting for culture results. Defer management of right hemothorax and chest tube to thoracic surgery.  ASSESSMENT / PLAN:  CARDIOVASCULAR A:  Shock - Likely septic. Severe Aortic Stenosis - S/P TAVR 10/24. Pericardial Effusion - S/P Pericardiocentesis 10/20 in prep for TAVR. Still with small residual effusion on TTE 10/26. Atrial Appendage Thrombus H/O HTN H/O Persistent Atrial Fibrillation  P:  Continuous Telemetry Monitoring Vitals per unit protocol Post Procedure care per Cardiology  Primacor & Amiodarone drips per cardiology Levophed & Vasopressin drips to maintain MAP >65 & SBP >90 Lipitor qhs ASA 81mg  daily  INFECTIOUS A:   Sepsis Possible HCAP  P:   Empiric Vancomycin & Zosyn Day #1 Awaiting culture results Trending Procalcitonin per Algorithm  PULMONARY A: Acute Hypoxic Respiratory Failure Right Hemothorax H/O Chronic Bronchitis  P:   Weaning FiO2 for Sat >92% Continuous Pulse Oximetry  Chest Tube Management per CVTS Repeat CXR AM 10/28  RENAL A:   Oliguric Acute Renal Failure Hyponatremia - Mild. Metabolic Acidosis - Mild.  P:   Monitoring UOP Trending Renal Function & Electrolytes daily Replacing electrolytes as indicated Maintaining MAP >65 Checking Renal U/S  GASTROINTESTINAL A:   H/O Liver Cirrhosis - New Diagnosis. H/O GERD  P:   Heart Health Diet Protonix PO daily Miralax PO daily  HEMATOLOGIC A:   Right Hemothorax Anemia - Mild. S/P  Transfusion Leukocytosis - Likely due to sepsis.  P:  Trending cell counts daily w/ CBC SCDs No Chemical DVT ppx given hemothorax  ENDOCRINE A:   H/O Thyroid Nodules Hyperglycemia - No h/o DM.    P:   Checking TSH, Free T4, A1c, & Cortisol. Monitoring glucose on daily labs  NEUROLOGIC A:   Post Procedure Pain  P:   Oxycodone IR PO prn Percocet PO prn Morphine IV prn Tylenol prn Trazodone qhs prn  FAMILY  - Updates:  Patient updated at bedside by Dr. Jamison Neighbor 10/27.  I spent a total of 34 minutes of critical care time today caring for the patient and reviewing the patient's electronic medical record.  Donna Christen Jamison Neighbor, M.D. [facility] Pager:  [phone] After 3pm or if no response, call [phone] 09/08/2016, 12:50 PM

## 2016-09-08 NOTE — Procedures (Signed)
Arterial Catheter Insertion Procedure Note Maxtyn Avent [id number] 04-03-1951  Procedure: Insertion of Arterial Catheter  Indications: Blood pressure monitoring  Procedure Details Consent: Risks of procedure as well as the alternatives and risks of each were explained to the (patient/caregiver).  Consent for procedure obtained. Time Out: Verified patient identification, verified procedure, site/side was marked, verified correct patient position, special equipment/implants available, medications/allergies/relevent history reviewed, required imaging and test results available.  Performed  Maximum sterile technique was used including antiseptics, cap, gloves, gown, hand hygiene, mask and sheet. Skin prep: Chlorhexidine; local anesthetic administered 20 gauge catheter was inserted into left radial artery using the Seldinger technique.  Evaluation Blood flow good; BP tracing good. Complications: No apparent complications.   Ancil Boozer 09/08/2016

## 2016-09-08 NOTE — Care Management Note (Signed)
Case Management Note  Patient Details  Name: Lucas Munoz MRN: [id number] Date of Birth: [birth year]  Subjective/Objective:    Pt admitted with acute on chronic systolic HF                Action/Plan:  CM assessed pt - pt alert and oriented.  PTA independent from home alone.  Pt informed CM that he adheres to low sodium diet and daily weights.  CM will continue to follow for discharge needs    Expected Discharge Date:                  Expected Discharge Plan:     In-House Referral:     Discharge planning Services  CM Consult  Post Acute Care Choice:    Choice offered to:     DME Arranged:    DME Agency:     HH Arranged:    HH Agency:     Status of Service:  In process, will continue to follow  If discussed at Long Length of Stay Meetings, dates discussed:    Additional Comments:  Cherylann Parr, RN 09/08/2016, 11:33 AM

## 2016-09-08 NOTE — Progress Notes (Signed)
Pharmacy Antibiotic Note  Lucas Munoz is a 65 y.o. male admitted 10/12 with severe AS and heart failure, s/p TAVR 10/24, now with hypotension requiring levophed, leukocytosis, and CXR showing possible infiltrates. He will begin vancomycin and zosyn for possible septic shock and right sided pneumonia. He is in renal failure with sCr 3.7 and CrCl ~ 20 ml/min.  Vancomycin trough goal 15-20  Plan: 1) Vancomycin 1g IV q48 2) Zosyn 3.375g IV q8 (4 hour infusion) 3) Follow renal function, cultures, LOT, level as needed  Height: 5\' 8"  (172.7 cm) Weight: 181 lb (82.1 kg) IBW/kg (Calculated) : 68.4  Temp (24hrs), Avg:98.1 F (36.7 C), Min:97.6 F (36.4 C), Max:99.1 F (37.3 C)   Recent Labs Lab 09/05/16 0315  09/06/16 0357 09/07/16 0300 09/07/16 0945 09/07/16 1537 09/08/16 0230  WBC 6.2  < > 7.3 15.3* 18.9* 21.0* 20.9*  CREATININE 1.40*  --  1.22 2.18*  --  3.09* 3.71*  < > = values in this interval not displayed.  Estimated Creatinine Clearance: 20.2 mL/min (by C-G formula based on SCr of 3.71 mg/dL (H)).    Allergies  Allergen Reactions  . Potassium-Containing Compounds Other (See Comments)    Upset stomach    Antimicrobials this admission: 10/27 Vancomycin >> 10/27 Zosyn >>  Dose adjustments this admission: n/a  Microbiology results: 10/20 pericardial fluid >> negative final 10/27 blood x2>> MRSA PCR negative  Thank you for allowing pharmacy to be a part of this patient's care.  Fredrik Rigger 09/08/2016 11:50 AM

## 2016-09-08 NOTE — Care Management Important Message (Signed)
Important Message  Patient Details  Name: [name] MRN: [id number] Date of Birth: [birth year]   Medicare Important Message Given:  Yes    Kyla Balzarine 09/08/2016, 2:41 PM

## 2016-09-08 NOTE — Progress Notes (Signed)
Patient ID: Lucas Munoz, male   DOB: [birth year], 65 y.o.   MRN: [id number] TCTS DAILY ICU PROGRESS NOTE                   [address]            [place] [address]          [phone]   3 Days Post-Op Procedure(s) (LRB): TRANSCATHETER AORTIC VALVE REPLACEMENT, TRANSFEMORAL (N/A) TRANSESOPHAGEAL ECHOCARDIOGRAM (TEE) (N/A) CHEST TUBE INSERTION (Right)  Total Length of Stay:  LOS: 15 days   Subjective: Feels better, ate some food without nausea   Objective: Vital signs in last 24 hours: Temp:  [97.6 F (36.4 C)-99.1 F (37.3 C)] 98.7 F (37.1 C) (10/27 1500) Pulse Rate:  [77-144] 126 (10/27 1415) Cardiac Rhythm: Atrial fibrillation (10/27 0800) Resp:  [12-27] 14 (10/27 1415) BP: (63-133)/(36-83) 124/64 (10/27 1415) SpO2:  [89 %-99 %] 93 % (10/27 1415) Arterial Line BP: (59-127)/(34-51) 111/46 (10/27 1415) Weight:  [181 lb (82.1 kg)] 181 lb (82.1 kg) (10/27 0424)  Filed Weights   09/06/16 0600 09/07/16 0500 09/08/16 0424  Weight: 171 lb 15.3 oz (78 kg) 178 lb 12.7 oz (81.1 kg) 181 lb (82.1 kg)    Weight change: 2 lb 3.3 oz (1 kg)   Hemodynamic parameters for last 24 hours: CVP:  [3 mmHg-7 mmHg] 7 mmHg  Intake/Output from previous day: 10/26 0701 - 10/27 0700 In: 3904.6 [I.V.:3234.6; Blood:670] Out: 1580 [Urine:80; Chest Tube:1500]  Intake/Output this shift: Total I/O In: 990.4 [P.O.:120; I.V.:870.4] Out: 405 [Urine:125; Chest Tube:280]  Current Meds: Scheduled Meds: . sodium chloride   Intravenous Once  . sodium chloride   Intravenous Once  . acetaminophen  1,000 mg Oral Q6H   Or  . acetaminophen (TYLENOL) oral liquid 160 mg/5 mL  1,000 mg Per Tube Q6H  . acetaminophen (TYLENOL) oral liquid 160 mg/5 mL  650 mg Per Tube Once   Or  . acetaminophen  650 mg Rectal Once  . aspirin  81 mg Oral Daily  . atorvastatin  40 mg Oral q1800  . mouth rinse  15 mL Mouth Rinse BID  . pantoprazole  40 mg Oral Daily  . piperacillin-tazobactam (ZOSYN)  IV   3.375 g Intravenous Q8H  . polyethylene glycol  17 g Oral Daily  . sodium chloride flush  10-40 mL Intracatheter Q12H  . sodium chloride flush  3 mL Intravenous Q12H  . vancomycin  1,000 mg Intravenous Q48H   Continuous Infusions: . sodium chloride 100 mL/hr at 09/08/16 1500  . amiodarone 30 mg/hr (09/08/16 1400)  . milrinone 0.25 mcg/kg/min (09/08/16 1400)  . norepinephrine (LEVOPHED) Adult infusion 37 mcg/min (09/08/16 1617)  . vasopressin (PITRESSIN) infusion - *FOR SHOCK* 0.04 Units/min (09/08/16 1400)   PRN Meds:.sodium chloride, Place/Maintain arterial line **AND** sodium chloride, acetaminophen, alum & mag hydroxide-simeth, docusate sodium, lactulose, morphine injection, ondansetron (ZOFRAN) IV, ondansetron (ZOFRAN) IV, oxyCODONE, oxyCODONE-acetaminophen, sodium chloride flush, sodium chloride flush, traMADol, traZODone, white petrolatum  General appearance: alert and cooperative Neurologic: confused Heart: irregularly irregular rhythm Lungs: diminished breath sounds bibasilar Abdomen: soft, non-tender; bowel sounds normal; no masses,  no organomegaly Extremities: extremities normal, atraumatic, no cyanosis or edema and Homans sign is negative, no sign of DVT  Lab Results: CBC: Recent Labs  09/07/16 1537 09/08/16 0230  WBC 21.0* 20.9*  HGB 10.2* 11.2*  HCT 31.8* 34.5*  PLT 168 186   BMET:  Recent Labs  09/07/16 1537 09/08/16 0230  NA 129* 128*  K 4.3 4.7  CL 94* 93*  CO2 22 21*  GLUCOSE 143* 135*  BUN 23* 29*  CREATININE 3.09* 3.71*  CALCIUM 8.4* 8.3*    CMET: Lab Results  Component Value Date   WBC 20.9 (H) 09/08/2016   HGB 11.2 (L) 09/08/2016   HCT 34.5 (L) 09/08/2016   PLT 186 09/08/2016   GLUCOSE 135 (H) 09/08/2016   ALT 22 09/08/2016   AST 31 09/08/2016   NA 128 (L) 09/08/2016   K 4.7 09/08/2016   CL 93 (L) 09/08/2016   CREATININE 3.71 (H) 09/08/2016   BUN 29 (H) 09/08/2016   CO2 21 (L) 09/08/2016   TSH 2.367 09/08/2016   INR 1.88 09/08/2016     PT/INR:  Recent Labs  09/08/16 0230  LABPROT 21.9*  INR 1.88   Radiology: [facility]  Result Date: 09/08/2016 CLINICAL DATA:  Acute renal failure EXAM: RENAL / URINARY TRACT ULTRASOUND COMPLETE COMPARISON:  CT abdomen pelvis 04/11/2016 FINDINGS: Right Kidney: Length: 10.8 cm. Negative for obstruction or mass. Limited evaluation the right kidney due to chest tube and bandages. Left Kidney: Length: 12.2 cm. Echogenicity within normal limits. No mass or hydronephrosis visualized. Bladder: Appears normal for degree of bladder distention. IMPRESSION: Negative renal ultrasound. Electronically Signed   By: Marlan Palauharles  Clark M.D.   On: 09/08/2016 16:09   Dg Chest Port 1 View  Result Date: 09/08/2016 CLINICAL DATA:  Shortness of breath.  CHF. EXAM: PORTABLE CHEST 1 VIEW COMPARISON:  09/07/2016. FINDINGS: Right PICC line and right chest tube in stable position. No pneumothorax. Right pleural effusion is improved slightly. Cardiomegaly with diffuse pulmonary interstitial prominence mild congestive heart failure cannot be excluded . IMPRESSION: 1. Right PICC line right chest tube in stable position. No pneumothorax. Right pleural effusion has improved slightly. 2. With bilateral from interstitial prominence. Mild congestive heart failure cannot be excluded . Electronically Signed   By: Maisie Fushomas  Register   On: 09/08/2016 08:48   Chronic Kidney Disease   Stage I     GFR >90  Stage II    GFR 60-89  Stage IIIA GFR 45-59  Stage IIIB GFR 30-44  Stage IV   GFR 15-29  Stage V    GFR  <15  Lab Results  Component Value Date   CREATININE 3.71 (H) 09/08/2016   Estimated Creatinine Clearance: 20.2 mL/min (by C-G formula based on SCr of 3.71 mg/dL (H)).   Assessment/Plan: S/P Procedure(s) (LRB): TRANSCATHETER AORTIC VALVE REPLACEMENT, TRANSFEMORAL (N/A) TRANSESOPHAGEAL ECHOCARDIOGRAM (TEE) (N/A) CHEST TUBE INSERTION (Right) Acute on chronic renal disease  1500 ml from right chest tube now slowed down bp  better     Delight Ovensdward B Colt Martelle 09/08/2016 5:48 PM

## 2016-09-09 ENCOUNTER — Inpatient Hospital Stay (HOSPITAL_COMMUNITY): Payer: Commercial Managed Care - HMO

## 2016-09-09 DIAGNOSIS — I5020 Unspecified systolic (congestive) heart failure: Secondary | ICD-10-CM

## 2016-09-09 DIAGNOSIS — Z952 Presence of prosthetic heart valve: Secondary | ICD-10-CM

## 2016-09-09 LAB — URINE MICROSCOPIC-ADD ON

## 2016-09-09 LAB — RENAL FUNCTION PANEL
Albumin: 2.1 g/dL — ABNORMAL LOW (ref 3.5–5.0)
Anion gap: 7 (ref 5–15)
BUN: 33 mg/dL — AB (ref 6–20)
CHLORIDE: 94 mmol/L — AB (ref 101–111)
CO2: 23 mmol/L (ref 22–32)
CREATININE: 3.22 mg/dL — AB (ref 0.61–1.24)
Calcium: 7.8 mg/dL — ABNORMAL LOW (ref 8.9–10.3)
GFR calc Af Amer: 21 mL/min — ABNORMAL LOW (ref >60)
GFR calc non Af Amer: 18 mL/min — ABNORMAL LOW (ref >60)
Glucose, Bld: 131 mg/dL — ABNORMAL HIGH (ref 65–99)
Phosphorus: 4.2 mg/dL (ref 2.5–4.6)
Potassium: 4.9 mmol/L (ref 3.5–5.1)
Sodium: 124 mmol/L — ABNORMAL LOW (ref 135–145)

## 2016-09-09 LAB — CBC WITH DIFFERENTIAL/PLATELET
Basophils Absolute: 0 10*3/uL (ref 0.0–0.1)
Basophils Relative: 0 %
EOS ABS: 0.3 10*3/uL (ref 0.0–0.7)
EOS PCT: 2 %
HCT: 26.2 % — ABNORMAL LOW (ref 39.0–52.0)
HEMOGLOBIN: 8.7 g/dL — AB (ref 13.0–17.0)
LYMPHS ABS: 0.7 10*3/uL (ref 0.7–4.0)
Lymphocytes Relative: 5 %
MCH: 26.5 pg (ref 26.0–34.0)
MCHC: 33.2 g/dL (ref 30.0–36.0)
MCV: 79.9 fL (ref 78.0–100.0)
MONOS PCT: 12 %
Monocytes Absolute: 1.8 10*3/uL — ABNORMAL HIGH (ref 0.1–1.0)
Neutro Abs: 11.5 10*3/uL — ABNORMAL HIGH (ref 1.7–7.7)
Neutrophils Relative %: 80 %
Platelets: 109 10*3/uL — ABNORMAL LOW (ref 150–400)
RBC: 3.28 MIL/uL — ABNORMAL LOW (ref 4.22–5.81)
RDW: 17.2 % — ABNORMAL HIGH (ref 11.5–15.5)
WBC: 14.3 10*3/uL — ABNORMAL HIGH (ref 4.0–10.5)

## 2016-09-09 LAB — OSMOLALITY: Osmolality: 267 mOsm/kg — ABNORMAL LOW (ref 275–295)

## 2016-09-09 LAB — URINALYSIS, ROUTINE W REFLEX MICROSCOPIC
BILIRUBIN URINE: NEGATIVE
GLUCOSE, UA: NEGATIVE mg/dL
Ketones, ur: NEGATIVE mg/dL
Leukocytes, UA: NEGATIVE
Nitrite: NEGATIVE
Protein, ur: NEGATIVE mg/dL
SPECIFIC GRAVITY, URINE: 1.025 (ref 1.005–1.030)
pH: 5 (ref 5.0–8.0)

## 2016-09-09 LAB — PROTIME-INR
INR: 1.77
PROTHROMBIN TIME: 20.8 s — AB (ref 11.4–15.2)

## 2016-09-09 LAB — COOXEMETRY PANEL
Carboxyhemoglobin: 1.7 % — ABNORMAL HIGH (ref 0.5–1.5)
METHEMOGLOBIN: 1 % (ref 0.0–1.5)
O2 Saturation: 63.1 %
Total hemoglobin: 8.8 g/dL — ABNORMAL LOW (ref 12.0–16.0)

## 2016-09-09 LAB — HEMOGLOBIN A1C
Hgb A1c MFr Bld: 6 % — ABNORMAL HIGH (ref 4.8–5.6)
MEAN PLASMA GLUCOSE: 126 mg/dL

## 2016-09-09 LAB — PROCALCITONIN: PROCALCITONIN: 2.76 ng/mL

## 2016-09-09 LAB — MAGNESIUM: MAGNESIUM: 2.7 mg/dL — AB (ref 1.7–2.4)

## 2016-09-09 LAB — SODIUM, URINE, RANDOM: SODIUM UR: 37 mmol/L

## 2016-09-09 LAB — OSMOLALITY, URINE: Osmolality, Ur: 363 mOsm/kg (ref 300–900)

## 2016-09-09 MED ORDER — HYDROCORTISONE NA SUCCINATE PF 100 MG IJ SOLR
50.0000 mg | Freq: Four times a day (QID) | INTRAMUSCULAR | Status: DC
  Administered 2016-09-09 – 2016-09-11 (×9): 50 mg via INTRAVENOUS
  Filled 2016-09-09 (×9): qty 2

## 2016-09-09 NOTE — Progress Notes (Signed)
Patient ID: Lucas Munoz, male   DOB: 05/20/1951, 65 y.o.   MRN: [id number] TCTS DAILY ICU PROGRESS NOTE                   [address]            [place] [id number]          [phone]   4 Days Post-Op Procedure(s) (LRB): TRANSCATHETER AORTIC VALVE REPLACEMENT, TRANSFEMORAL (N/A) TRANSESOPHAGEAL ECHOCARDIOGRAM (TEE) (N/A) CHEST TUBE INSERTION (Right)  Total Length of Stay:  LOS: 16 days   Subjective: Feels better  Objective: Vital signs in last 24 hours: Temp:  [97.7 F (36.5 C)-98.7 F (37.1 C)] 97.7 F (36.5 C) (10/28 0757) Pulse Rate:  [66-134] 100 (10/28 0645) Cardiac Rhythm: Atrial fibrillation (10/28 0530) Resp:  [0-27] 14 (10/28 0645) BP: (76-133)/(49-83) 104/49 (10/28 0400) SpO2:  [77 %-100 %] 100 % (10/28 0645) Arterial Line BP: (59-131)/(34-59) 101/51 (10/28 0345) Weight:  [187 lb 3.2 oz (84.9 kg)] 187 lb 3.2 oz (84.9 kg) (10/28 0515)  Filed Weights   09/07/16 0500 09/08/16 0424 09/09/16 0515  Weight: 178 lb 12.7 oz (81.1 kg) 181 lb (82.1 kg) 187 lb 3.2 oz (84.9 kg)    Weight change: 6 lb 3.2 oz (2.813 kg)   Hemodynamic parameters for last 24 hours: CVP:  [7 mmHg-10 mmHg] 8 mmHg  Intake/Output from previous day: 10/27 0701 - 10/28 0700 In: 2767.2 [P.O.:480; I.V.:2137.2; IV Piggyback:150] Out: 1425 [Urine:625; Chest Tube:800]  Intake/Output this shift: No intake/output data recorded.  Current Meds: Scheduled Meds: . sodium chloride   Intravenous Once  . sodium chloride   Intravenous Once  . acetaminophen  1,000 mg Oral Q6H   Or  . acetaminophen (TYLENOL) oral liquid 160 mg/5 mL  1,000 mg Per Tube Q6H  . acetaminophen (TYLENOL) oral liquid 160 mg/5 mL  650 mg Per Tube Once   Or  . acetaminophen  650 mg Rectal Once  . aspirin  81 mg Oral Daily  . atorvastatin  40 mg Oral q1800  . mouth rinse  15 mL Mouth Rinse BID  . pantoprazole  40 mg Oral Daily  . piperacillin-tazobactam (ZOSYN)  IV  3.375 g Intravenous Q8H  . polyethylene  glycol  17 g Oral Daily  . sodium chloride flush  10-40 mL Intracatheter Q12H  . sodium chloride flush  3 mL Intravenous Q12H  . vancomycin  1,000 mg Intravenous Q48H   Continuous Infusions: . amiodarone 30 mg/hr (09/09/16 0700)  . milrinone 0.25 mcg/kg/min (09/09/16 0700)  . norepinephrine (LEVOPHED) Adult infusion 16 mcg/min (09/09/16 0715)  . vasopressin (PITRESSIN) infusion - *FOR SHOCK* 0.04 Units/min (09/09/16 0700)   PRN Meds:.sodium chloride, Place/Maintain arterial line **AND** sodium chloride, acetaminophen, alum & mag hydroxide-simeth, docusate sodium, lactulose, morphine injection, ondansetron (ZOFRAN) IV, ondansetron (ZOFRAN) IV, oxyCODONE, oxyCODONE-acetaminophen, sodium chloride flush, sodium chloride flush, traMADol, traZODone, white petrolatum  General appearance: alert and cooperative Neurologic: intact Heart: irregularly irregular rhythm Lungs: diminished breath sounds bibasilar Abdomen: soft, non-tender; bowel sounds normal; no masses,  no organomegaly Extremities: extremities normal, atraumatic, no cyanosis or edema and Homans sign is negative, no sign of DVT  Lab Results: CBC: Recent Labs  09/08/16 0230 09/09/16 0420  WBC 20.9* 14.3*  HGB 11.2* 8.7*  HCT 34.5* 26.2*  PLT 186 109*   BMET:  Recent Labs  09/08/16 0230 09/09/16 0420  NA 128* 124*  K 4.7 4.9  CL 93* 94*  CO2 21* 23  GLUCOSE 135* 131*  BUN 29* 33*  CREATININE 3.71* 3.22*  CALCIUM 8.3* 7.8*    CMET: Lab Results  Component Value Date   WBC 14.3 (H) 09/09/2016   HGB 8.7 (L) 09/09/2016   HCT 26.2 (L) 09/09/2016   PLT 109 (L) 09/09/2016   GLUCOSE 131 (H) 09/09/2016   ALT 22 09/08/2016   AST 31 09/08/2016   NA 124 (L) 09/09/2016   K 4.9 09/09/2016   CL 94 (L) 09/09/2016   CREATININE 3.22 (H) 09/09/2016   BUN 33 (H) 09/09/2016   CO2 23 09/09/2016   TSH 2.367 09/08/2016   INR 1.77 09/09/2016   HGBA1C 6.0 (H) 09/08/2016    PT/INR:  Recent Labs  09/09/16 0420  LABPROT 20.8*    INR 1.77   Radiology: US Renal  Result Date: 09/08/2016 CLINICAL DATA:  Acute renal failure EXAM: RENAL / URINARY TRACT ULTRASOUND COMPLETE COMPARISON:  CT abdomen pelvis 04/11/2016 FINDINGS: Right Kidney: Length: 10.8 cm. Negative for obstruction or mass. Limited evaluation the right kidney due to chest tube and bandages. Left Kidney: Length: 12.2 cm. Echogenicity within normal limits. No mass or hydronephrosis visualized. Bladder: Appears normal for degree of bladder distention. IMPRESSION: Negative renal ultrasound. Electronically Signed   By: Marlan Palau M.D.   On: 09/08/2016 16:09     Assessment/Plan: S/P Procedure(s) (LRB): TRANSCATHETER AORTIC VALVE REPLACEMENT, TRANSFEMORAL (N/A) TRANSESOPHAGEAL ECHOCARDIOGRAM (TEE) (N/A) CHEST TUBE INSERTION (Right) Mobilize still with chest tube out put , leave chest tube in place , would not start anticoagulation with current chest tube output and previous of blood loss hct down from 34 to 26 Renal function stable    Delight Ovens 09/09/2016 8:37 AM

## 2016-09-09 NOTE — Consult Note (Signed)
PULMONARY / CRITICAL CARE MEDICINE   Name: Lucas Munoz MRN: [id number] DOB: Apr 09, 1951    ADMISSION DATE:  08/24/2016 CONSULTATION DATE:  09/08/2016  REFERRING MD:  Marca Ancona, M.D. / Cardiology  CHIEF COMPLAINT:  Septic Shock  HISTORY OF PRESENT ILLNESS:  65 y.o. male with history of cardiomyopathy and severe aortic stenosis. Patient underwent pericardiocentesis on 10/20 in preparation for transient aortic valve replacement. This was completed on 10/24. Postprocedure patient has chest tube removal with subsequent development of right hemothorax requiring repeat chest tube insertion. Patient developed persistent hypotension as well as acute oliguric renal failure requiring escalating doses of vasopressor support. In the setting of leukocytosis and probable underlying sepsis PCCM was consulted to assist with medical management. Patient denies any chest pain, pressure, or palpitations. He reports his dyspnea is minimal. Denies any chronic cough but did have a minimal amount of mucus production today. Denies any subjective fever, chills, or sweats. Patient did have some mild nausea earlier relieved with Zofran. Denies any abdominal pain or emesis. He is slightly constipated and without diarrhea.  SUBJECTIVE: remains on pressors, levo and vaso  VITAL SIGNS: BP 130/76 (BP Location: Left Arm)   Pulse 95   Temp 97.7 F (36.5 C) (Oral)   Resp 14   Ht 5\' 8"  (1.727 m)   Wt 84.9 kg (187 lb 3.2 oz)   SpO2 100%   BMI 28.46 kg/m   HEMODYNAMICS: CVP:  [7 mmHg-10 mmHg] 8 mmHg  VENTILATOR SETTINGS:    INTAKE / OUTPUT: I/O last 3 completed shifts: In: 5147 [P.O.:480; I.V.:4182; Blood:335; IV Piggyback:150] Out: 1670 [Urine:680; Chest Tube:990]  PHYSICAL EXAMINATION: General: awake alert in chair Neuro: nonfocal, appropriate, cn intact HEENT:jvd PULM: ant clear , bases coarse CV: s1 s2 rrr GI: soft, bs wnl Extremities: edema min   LABS:  BMET  Recent Labs Lab 09/07/16 1537  09/08/16 0230 09/09/16 0420  NA 129* 128* 124*  K 4.3 4.7 4.9  CL 94* 93* 94*  CO2 22 21* 23  BUN 23* 29* 33*  CREATININE 3.09* 3.71* 3.22*  GLUCOSE 143* 135* 131*    Electrolytes  Recent Labs Lab 09/06/16 0357  09/07/16 1537 09/08/16 0230 09/09/16 0420  CALCIUM 8.2*  < > 8.4* 8.3* 7.8*  MG 1.7  --   --   --  2.7*  PHOS  --   --   --   --  4.2  < > = values in this interval not displayed.  CBC  Recent Labs Lab 09/07/16 1537 09/08/16 0230 09/09/16 0420  WBC 21.0* 20.9* 14.3*  HGB 10.2* 11.2* 8.7*  HCT 31.8* 34.5* 26.2*  PLT 168 186 109*    Coag's  Recent Labs Lab 09/05/16 1400 09/07/16 0300 09/08/16 0230 09/09/16 0420  APTT 30  --   --   --   INR 1.21 1.54 1.88 1.77    Sepsis Markers  Recent Labs Lab 09/08/16 0902 09/09/16 0420  PROCALCITON 4.63 2.76    ABG  Recent Labs Lab 09/05/16 1350  PHART 7.420  PCO2ART 43.5  PO2ART 179.0*    Liver Enzymes  Recent Labs Lab 09/08/16 0230 09/09/16 0420  AST 31  --   ALT 22  --   ALKPHOS 102  --   BILITOT 2.4*  --   ALBUMIN 2.4* 2.1*    Cardiac Enzymes No results for input(s): TROPONINI, PROBNP in the last 168 hours.  Glucose  Recent Labs Lab 09/07/16 1252 09/07/16 1628 09/08/16 0807 09/08/16 1155 09/08/16 2004 09/08/16 2346  GLUCAP 110* 126* 126* 124* 189* 135*    Imaging US Renal  Result Date: 09/08/2016 CLINICAL DATA:  Acute renal failure EXAM: RENAL / URINARY TRACT ULTRASOUND COMPLETE COMPARISON:  CT abdomen pelvis 04/11/2016 FINDINGS: Right Kidney: Length: 10.8 cm. Negative for obstruction or mass. Limited evaluation the right kidney due to chest tube and bandages. Left Kidney: Length: 12.2 cm. Echogenicity within normal limits. No mass or hydronephrosis visualized. Bladder: Appears normal for degree of bladder distention. IMPRESSION: Negative renal ultrasound. Electronically Signed   By: Marlan Palau M.D.   On: 09/08/2016 16:09   Dg Chest Port 1 View  Result Date:  09/09/2016 CLINICAL DATA:  Status post TAVR and right chest tube placement for hemothorax. EXAM: PORTABLE CHEST 1 VIEW COMPARISON:  09/08/2016 FINDINGS: Right chest tube remains in place with no significant pleural fluid volume. No pneumothorax. There remains atelectasis in the right lower lung. PICC line positioning stable in the distal SVC. Stable cardiac enlargement and appearance of transcatheter aortic valve. Suspect mild residual interstitial edema which appears stable. IMPRESSION: No significant residual right pleural fluid volume with atelectasis remaining in the right lower lung. Stable mild interstitial edema. Electronically Signed   By: Irish Lack M.D.   On: 09/09/2016 09:13    STUDIES:  PFT 08/31/16:  FVC 1.16 L (40%) FEV1 1.26 L (40%) FEV1/FVC 0.75 negative bronchodilator response TTE 09/07/16 (post TAVR):  EF 50-55%. Bioprosthetic aortic valve present. RV normal in size and function. Small pericardial effusion.  MICROBIOLOGY: MRSA PCR 10/13: Negative Pericardial Effusion 10/20:  Negative MRSA PCR 10/23:  Negative Blood Ctx x2 10/27 >>  ANTIBIOTICS: Cefuroxime 10/24 for Surgical PPx Vancomycin 10/27 >> Zosyn 10/27 >>  SIGNIFICANT EVENTS: 10/12 - Admit 10/20 - Pericardiocentesis:  775cc fluid removed. Cytology & Malignancy neg. 10/24 - Extubated post TAVR 10/26 - Chest tube reinserted by CVTS  LINES/TUBES: R Chest Tube 10/26 >> R DL PICC 16/10 >> L Radial Art Line 10/27 >> PIV x1  DISCUSSION:  65 y.o. male s/p TAVR with subsequent right hemothorax status post right-sided chest tube replacement. The patient does seem to have sepsis as a likely source for his hypotension given his repeat echocardiogram findings with improved ejection fraction. Agree with empiric coverage with vancomycin and Zosyn for now while awaiting for culture results. Defer management of right hemothorax and chest tube to thoracic surgery.  ASSESSMENT / PLAN:  CARDIOVASCULAR A:  Shock - Likely  septic ( PCT down now) Severe Aortic Stenosis - S/P TAVR 10/24. Pericardial Effusion - S/P Pericardiocentesis 10/20 in prep for TAVR. Still with small residual effusion on TTE 10/26. Atrial Appendage Thrombus H/O HTN H/O Persistent Atrial Fibrillation Probable component rel AI  P:  Primacor & Amiodarone drips per cardiology Levophed & Vasopressin drips to maintain MAP >60 & SBP >90 If to levophed 5 , would dc vaso then Lipitor qhs ASA 81mg  daily Cortisol 22.7, borderline response, add empiric stress roids fo rnow  INFECTIOUS A:   Sepsis Possible HCAP  P:   Empiric Vancomycin & Zosyn Day #2 Awaiting culture results Trending Procalcitonin per Algorithm - encouraging, follow further in am  likley dc vanc in am if no staph  PULMONARY A: Acute Hypoxic Respiratory Failure Right Hemothorax H/O Chronic Bronchitis  P:   Weaning FiO2 for Sat >92% Continuous Pulse Oximetry  Chest Tube Management per CVTS Repeat CXR in am   RENAL A:   Oliguric Acute Renal Failure improved, ATN likely Hyponatremia - Mild. Metabolic Acidosis - Mild.  P:  US neg Chem in pm Assess ua, urine na, osm Get serum osm May need lasix  GASTROINTESTINAL A:   H/O Liver Cirrhosis - New Diagnosis. H/O GERD  P:   Heart Health Diet Protonix PO daily Miralax PO daily lactulose  HEMATOLOGIC A:   Right Hemothorax Anemia - Mild. S/P Transfusion Leukocytosis - Likely due to sepsis. Drop plat, consumption  P:  Trending cell counts daily w/ CBC SCDs  ENDOCRINE A:   H/O Thyroid Nodules Hyperglycemia - No h/o DM. Rel AI likely P:   Checking TSH 2.3, T4 reviewed, get t3 in am  Stress roids for now Monitoring glucose on daily labs  NEUROLOGIC A:   Post Procedure Pain  P:   Oxycodone IR PO prn Percocet PO prn Morphine IV prn Tylenol prn Trazodone qhs prn  FAMILY  - Updates:  To pt by me  cccm time 30 min  Mcarthur Rossettianiel J. Tyson AliasFeinstein, MD, FACP Pgr: [phone] Post Pulmonary &  Critical Care

## 2016-09-09 NOTE — Progress Notes (Signed)
Patient ID: Lucas Munoz, male   DOB: 02/09/1951, 65 y.o.   MRN: [id number]    SUBJECTIVE:   PICC placed , initial co-ox on 10/14 47%.  He was started on milrinone. Milrinone increased to 0.375 mcg 08/29/16 for low mixed venous sat on RHC (CI 1.9).  Underwent pericardiocentesis on 10/20 with 775cc out. Fluid transudative. Drained another 225 cc yesterday  TEE 10/23 confirmed amorphous material in LA appendage, appears to be forming thrombus.  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   Post-TAVR echo 10/25 with EF up to 45%, moderate LVH, bioprosthetic AVR with mean gradient 20 (higher than expected), PASP 37, normal RV, no pericardial effusion.   Overnight 10/26 developed nausea/vomiting and hypotension.  He was started on norepinephrine and IV fluid with low CVP. CXR showed large right pleural effusion accumulation. Chest tube replaced on right, 1300 cc bloody fluid out.  Heparin stopped. He remained hypotensive.  1 unit PRBCs given. Also noted rise in creatinine with poor UOP.  Echo 10/26 with EF about 45%, small pericardial effusion with no tamponade.   PCT up to 4.63, possible HCAP.  Suspect septic shock.   Doing better today.  Creatinine 3.71 => 3.22.  UOP 625 yesterday, 800 cc out via chest tube.  BP much improved now, currently on vasopressin 0.04 + norepinephrine 16.  CVP 8 this morning, co-ox 63%.    RHC/LHC (08/29/16) Left Main  Mild ostial narrowing.  Left Anterior Descending  30% proximal LAD stenosis. 60% distal LAD stenosis. Moderate D1 without significant disease. Small to moderate D2 with long 70% ostial stenosis.  Left Circumflex  50% proximal LCx stenosis at OM1. Moderate OM1 with 30% ostial stenosis.  Right Coronary Artery  40% proximal and 40% mid RCA stenosis.   RA mean 19 RV 65/15 PA 69/34, mean 51 PCWP mean 43 (not sure we got an accurate PCWP).  AO 106/87 Oxygen saturations: PA 46% AO 92% Cardiac Output (Fick) 3.8    Cardiac Index (Fick) 1.9  Echo (10/14): EF 15%, severe AS, moderately decreased RV systolic function, PASP 65 mmHg, moderate to large pericardial effusion without tamponade.   Scheduled Meds: . sodium chloride   Intravenous Once  . sodium chloride   Intravenous Once  . acetaminophen  1,000 mg Oral Q6H   Or  . acetaminophen (TYLENOL) oral liquid 160 mg/5 mL  1,000 mg Per Tube Q6H  . acetaminophen (TYLENOL) oral liquid 160 mg/5 mL  650 mg Per Tube Once   Or  . acetaminophen  650 mg Rectal Once  . aspirin  81 mg Oral Daily  . atorvastatin  40 mg Oral q1800  . mouth rinse  15 mL Mouth Rinse BID  . pantoprazole  40 mg Oral Daily  . piperacillin-tazobactam (ZOSYN)  IV  3.375 g Intravenous Q8H  . polyethylene glycol  17 g Oral Daily  . sodium chloride flush  10-40 mL Intracatheter Q12H  . sodium chloride flush  3 mL Intravenous Q12H  . vancomycin  1,000 mg Intravenous Q48H   Continuous Infusions: . amiodarone 30 mg/hr (09/09/16 0700)  . milrinone 0.25 mcg/kg/min (09/09/16 0700)  . norepinephrine (LEVOPHED) Adult infusion 16 mcg/min (09/09/16 0715)  . vasopressin (PITRESSIN) infusion - *FOR SHOCK* 0.04 Units/min (09/09/16 0700)   PRN Meds:.sodium chloride, Place/Maintain arterial line **AND** sodium chloride, acetaminophen, alum & mag hydroxide-simeth, docusate sodium, lactulose, morphine injection, ondansetron (ZOFRAN) IV, ondansetron (ZOFRAN) IV, oxyCODONE, oxyCODONE-acetaminophen, sodium chloride flush, sodium chloride flush, traMADol, traZODone, white petrolatum  Vitals:   09/09/16 0600 09/09/16 0615 09/09/16 0630 09/09/16 0645  BP:      Pulse: (!) 119 88 (!) 105 100  Resp: (!) 9 10 (!) 5 14  Temp:      TempSrc:      SpO2: (!) 77% 97% 100% 100%  Weight:      Height:        Intake/Output Summary (Last 24 hours) at 09/09/16 0752 Last data filed at 09/09/16 0700  Gross per 24 hour  Intake          2767.24 ml  Output             1425 ml  Net          1342.24 ml     LABS: Basic Metabolic Panel:  Recent Labs  96/04/54 0230 09/09/16 0420  NA 128* 124*  K 4.7 4.9  CL 93* 94*  CO2 21* 23  GLUCOSE 135* 131*  BUN 29* 33*  CREATININE 3.71* 3.22*  CALCIUM 8.3* 7.8*  MG  --  2.7*  PHOS  --  4.2   Liver Function Tests:  Recent Labs  09/08/16 0230 09/09/16 0420  AST 31  --   ALT 22  --   ALKPHOS 102  --   BILITOT 2.4*  --   PROT 5.7*  --   ALBUMIN 2.4* 2.1*   No results for input(s): LIPASE, AMYLASE in the last 72 hours. CBC:  Recent Labs  09/08/16 0230 09/09/16 0420  WBC 20.9* 14.3*  NEUTROABS  --  11.5*  HGB 11.2* 8.7*  HCT 34.5* 26.2*  MCV 80.4 79.9  PLT 186 109*   Cardiac Enzymes: No results for input(s): CKTOTAL, CKMB, CKMBINDEX, TROPONINI in the last 72 hours. BNP: Invalid input(s): POCBNP D-Dimer: No results for input(s): DDIMER in the last 72 hours. Hemoglobin A1C:  Recent Labs  09/08/16 1507  HGBA1C 6.0*   Fasting Lipid Panel: No results for input(s): CHOL, HDL, LDLCALC, TRIG, CHOLHDL, LDLDIRECT in the last 72 hours. Thyroid Function Tests:  Recent Labs  09/08/16 1507  TSH 2.367   Anemia Panel: No results for input(s): VITAMINB12, FOLATE, FERRITIN, TIBC, IRON, RETICCTPCT in the last 72 hours.  RADIOLOGY: US Renal  Result Date: 09/08/2016 CLINICAL DATA:  Acute renal failure EXAM: RENAL / URINARY TRACT ULTRASOUND COMPLETE COMPARISON:  CT abdomen pelvis 04/11/2016 FINDINGS: Right Kidney: Length: 10.8 cm. Negative for obstruction or mass. Limited evaluation the right kidney due to chest tube and bandages. Left Kidney: Length: 12.2 cm. Echogenicity within normal limits. No mass or hydronephrosis visualized. Bladder: Appears normal for degree of bladder distention. IMPRESSION: Negative renal ultrasound. Electronically Signed   By: Marlan Palau M.D.   On: 09/08/2016 16:09   Ct Cardiac Morph/pulm Vein W/cm&w/o Ca Score  Addendum Date: 09/01/2016   ADDENDUM REPORT: 09/01/2016 17:18 CLINICAL DATA:   Aortic stenosis EXAM: Cardiac TAVR CT TECHNIQUE: The patient was scanned on a Philips 256 scanner. A 120 kV retrospective scan was triggered in the descending thoracic aorta at 111 HU's. Gantry rotation speed was 270 msecs and collimation was .9 mm. No beta blockade or nitro were given. The 3D data set was reconstructed in 5% intervals of the R-R cycle. Systolic and diastolic phases were analyzed on a dedicated work station using MPR, MIP and VRT modes. The patient received 80 cc of contrast. 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 Heavily calcified Aorta:  Aortic root dilated Sinotubular Junction:  32 mm Ascending Thoracic  Aorta:  43 mm Aortic Arch:  30 mm Descending Thoracic Aorta:  26 mm Sinus of Valsalva Measurements: Non-coronary:  33 mm Right -coronary:  31 mm Left -coronary:  32 mm Coronary Artery Height above Annulus: Left Main:  19 mm above annulus Right Coronary:  10 mm above annulus Virtual Basal Annulus Measurements: Maximum/Minimum Diameter:  27.1 x 20.3 mm Perimeter:  78 mm Area:  442 mm2 Coronary Arteries:  Sufficient height above annulus for deployment Optimum Fluoroscopic Angle for Delivery:  LAO 29 degrees IMPRESSION: 1) functionally bicuspid AV heavily calcified with annulus suitable for 26 mm Sapien 3 valve 2) Moderate aortic root enlargement 4.3 cm 3) Large pericardial effusion patient to have pericardiocentesis post CT 4) Cannot r/o LAA thrombus suggest TEE correlation 5) Optimum angiographic angle for deployment LAO 29 degrees 6) Coronary heights sufficient for deployment Charlton Haws Electronically Signed   By: Charlton Haws M.D.   On: 09/01/2016 17:18   Result Date: 09/01/2016 EXAM: OVER-READ INTERPRETATION  CT CHEST The following report is an over-read performed by radiologist Dr. Royal Piedra [facility] on 09/01/2016. This over-read does not include interpretation of cardiac or coronary anatomy or pathology. The coronary calcium  score/coronary CTA interpretation by the cardiologist is attached. COMPARISON:  Chest CT 05/01/2016. FINDINGS: Large pericardial effusion. No pericardial calcification. Aortic atherosclerosis with aneurysmal dilatation of the ascending thoracic aorta which measures up to 4.8 cm in diameter (unchanged). Large right-sided pleural effusion layering dependently. Extensive passive atelectasis in the right lower lobe. Tiny calcified granuloma in the periphery of the right lower lobe. 4 mm subpleural noncalcified pulmonary nodule in the left lower lobe abutting the major fissure (image 71 of series 512), unchanged compared to prior study 05/01/2016, favored to represent a subpleural lymph node. No other suspicious appearing pulmonary nodules or masses are noted in the visualized portions of the thorax. Partially calcified pleural plaque in the right hemithorax (image 31 of series 513) again noted. No calcified pleural plaques in the left hemithorax. Visualized portions of the upper abdomen demonstrate a nodular contour of the liver, suggestive of underlying cirrhosis. No aggressive appearing lytic or blastic lesions are noted in the visualized portions of the skeleton. Right upper extremity PICC with tip terminating in the distal superior vena cava. IMPRESSION: 1. Interval increased size of what is now a large pericardial effusion. No associated pericardial calcification. 2. Enlargement of a large layering right-sided pleural effusion. This is associated with extensive passive atelectasis in the right lower lobe. 3. Aortic atherosclerosis, with similar aneurysmal dilatation of the ascending thoracic aorta which measures up to 4.8 cm in diameter. 4. Morphologic changes in the liver indicative of cirrhosis, as above. 5. 4 mm subpleural nodule in the periphery of the left lower lobe is nonspecific, but statistically likely a subpleural lymph node. No follow-up needed if patient is low-risk. Non-contrast chest CT can be  considered in 12 months if patient is high-risk. This recommendation follows the consensus statement: Guidelines for Management of Incidental Pulmonary Nodules Detected on CT Images: From the Fleischner Society 2017; Radiology 2017; 284:228-243. 6. Additional incidental findings, as above. Electronically Signed: By: Trudie Reed M.D. On: 09/01/2016 14:38   Dg Chest Port 1 View  Result Date: 09/08/2016 CLINICAL DATA:  Shortness of breath.  CHF. EXAM: PORTABLE CHEST 1 VIEW COMPARISON:  09/07/2016. FINDINGS: Right PICC line and right chest tube in stable position. No pneumothorax. Right pleural effusion is improved slightly. Cardiomegaly with diffuse pulmonary interstitial prominence mild congestive heart failure cannot be  excluded . IMPRESSION: 1. Right PICC line right chest tube in stable position. No pneumothorax. Right pleural effusion has improved slightly. 2. With bilateral from interstitial prominence. Mild congestive heart failure cannot be excluded . Electronically Signed   By: Maisie Fus  Register   On: 09/08/2016 08:48   Dg Chest Port 1 View  Result Date: 09/07/2016 CLINICAL DATA:  Hemothorax with new right chest tube. EXAM: PORTABLE CHEST 1 VIEW COMPARISON:  Earlier today FINDINGS: Decreased but still extensive right hemothorax after chest tube placement. No visualized gas component. Right upper extremity PICC with tip at the SVC level. Unchanged cardiopericardial enlargement. Status post transcatheter aortic valve replacement. IMPRESSION: 1. Right chest tube placement with diminished hemothorax. 2. Elsewhere stable chest. Electronically Signed   By: Marnee Spring M.D.   On: 09/07/2016 09:05   Dg Chest Port 1 View  Result Date: 09/07/2016 CLINICAL DATA:  Vomiting EXAM: PORTABLE CHEST 1 VIEW COMPARISON:  Yesterday FINDINGS: Stable right upper extremity PICC. Swan-Ganz catheter and right jugular introducer have been removed. Right chest tube is been removed. A large right pleural effusion has  accumulated. There is compressive atelectasis within much of the right lung. Vascular congestion. Left lung is otherwise clear. Cardiomegaly. Small right pneumothorax is suspected. IMPRESSION: Right chest tube removed. Large right pleural effusion has reaccumulated. Very small right apical pneumothorax has also developed. Electronically Signed   By: Jolaine Click M.D.   On: 09/07/2016 07:39   Dg Chest Port 1 View  Result Date: 09/06/2016 CLINICAL DATA:  Cardiac surgery EXAM: PORTABLE CHEST 1 VIEW COMPARISON:  Yesterday FINDINGS: Right upper extremity PICC, right chest tube, right jugular introducer, right Swan-Ganz catheter are stable. There is no pneumothorax. Small right pleural effusion is stable. Vascular congestion and mild edema are worse. Lower lung volumes. Cardiac silhouette remains markedly enlarged. IMPRESSION: Worsening vascular congestion and edema. No pneumothorax. Electronically Signed   By: Jolaine Click M.D.   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 Postoperative image. EXAM: PORTABLE CHEST 1 VIEW COMPARISON:  CT chest 09/01/2016 and single view of the chest 08/26/2016. FINDINGS: Endotracheal tube is in place with tip in good position at the level of the clavicular heads. Right IJ approach Swan-Ganz catheter tip is in the proximal right main pulmonary artery. Tip of right PICC projects in the lower superior vena cava. NG tube courses into the stomach. Right chest tube is identified. There is a small right pleural effusion and basilar atelectasis, improved since the prior chest film. The lungs are otherwise clear. The cardiopericardial silhouette is enlarged. No pneumothorax. IMPRESSION: Support tubes and lines projecting good position. Negative for pneumothorax. Decreased small right pleural effusion and basilar atelectasis. Enlarged cardiopericardial silhouette consistent with cardiomegaly and  pericardial effusion as seen on CT scan. Electronically Signed   By: Drusilla Kanner M.D.   On: 09/05/2016 13:56   Dg Chest Port 1 View  Result Date: 08/26/2016 CLINICAL DATA:  65 year old male with a history of shortness of breath EXAM: PORTABLE CHEST 1 VIEW COMPARISON:  08/25/2016, CT chest 05/01/2016 FINDINGS: Compare to the prior plain film there is unchanged size of the cardiac silhouette. Calcifications of the aortic arch. Interval placement of right upper extremity PICC with the tip appearing to terminate at the superior cavoatrial junction. Increasing opacity at the right base with thickening of the minor fissure, obscuration the right hemidiaphragm and right heart border. Interlobular septal thickening. IMPRESSION: Evidence of  CHF with increasing right-sided pleural effusion/atelectasis. Unchanged cardiac silhouette in this patient with a history of pericardial effusion imaged on prior CT 05/01/2016. Interval placement of right upper extremity PICC. Aortic atherosclerosis. Signed, Yvone Neu. Loreta Ave, DO [facility] Electronically Signed   By: Gilmer Mor D.O.   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 Dilated cardiomyopathy. Acute kidney injury. EXAM: PORTABLE CHEST 1 VIEW COMPARISON:  None. FINDINGS: Marked cardiac enlargement noted. Diffuse interstitial infiltrates consistent with mild interstitial edema. Small right pleural effusion also seen with right basilar atelectasis. IMPRESSION: Mild congestive heart failure, with small right pleural effusion and right basilar atelectasis. Electronically Signed   By: Myles Rosenthal M.D.   On: 08/25/2016 15:14   Ct Angio Chest/abd/pel For Dissection W And/or W/wo  Result Date: 09/03/2016 CLINICAL DATA:  65 year old male with history of severe aortic stenosis. Preprocedural study prior to potential  transcatheter aortic valve replacement. Recent history of pericardiocentesis. EXAM: CT ANGIOGRAPHY CHEST, ABDOMEN AND PELVIS TECHNIQUE: Multidetector CT imaging through the chest, abdomen and pelvis was performed using the standard protocol during bolus administration of intravenous contrast. Multiplanar reconstructed images and MIPs were obtained and reviewed to evaluate the vascular anatomy. CONTRAST:  80 mL of Isovue 370. COMPARISON:  Cardiac gated CTA 09/01/2016. FINDINGS: CTA CHEST FINDINGS Cardiovascular: Heart size is mildly enlarged. Small amount of residual pericardial fluid and/or thickening, decreased compared to the prior study. Pericardiocentesis catheter in place with tip terminating high in a superior pericardial recess adjacent to the aortic arch. No pericardial calcification. Severe thickening calcification of the aortic valve, which has an appearance suggestive of a bicuspid valve. There is aortic atherosclerosis, as well as atherosclerosis of the great vessels of the mediastinum and the coronary arteries, including calcified atherosclerotic plaque in the left anterior descending and right coronary arteries. 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 Large filling defect within the left atrial appendage, concerning for potential left atrial appendage thrombus. Right upper extremity PICC with tip terminating at the superior cavoatrial junction. Mediastinum/Lymph Nodes: Several borderline enlarged and minimally enlarged mediastinal lymph nodes measuring up to 14 mm in short axis, slightly increased compared to the prior study, likely reactive. Esophagus is unremarkable in appearance. No axillary lymphadenopathy. Lungs/Pleura: Large right pleural effusion again noted, lying dependently. This is associated with near complete passive atelectasis of the right lower lobe. Trace left pleural effusion with minimal passive subsegmental atelectasis in the dependent left  lower lobe. No acute consolidative airspace disease. Previously noted tiny subpleural known nodule in the anterior aspect of the left lower lobe is no longer noted, presumably a reactive subpleural lymph node on the prior examination. Partially calcified small right-sided pleural plaque is unchanged (image 63 of series 5). No other calcified pleural plaques are identified. Tiny calcified granulomas are again noted in the periphery of the right lower lobe, now with an atelectatic lung. Musculoskeletal/Soft Tissues: There are no aggressive appearing lytic or blastic lesions noted in the visualized portions of the skeleton. CTA ABDOMEN AND PELVIS FINDINGS Hepatobiliary: Liver has a shrunken appearance and nodular contour, compatible with underlying cirrhosis. No discrete cystic or solid hepatic lesions. No intra or extrahepatic biliary ductal dilatation. Large rim calcified gallstone in the gallbladder measuring up to 3.8 cm in diameter. No current findings to suggest an acute cholecystitis at this time. Pancreas: No pancreatic mass or peripancreatic inflammatory changes.  No pancreatic ductal dilatation. Pancreatic atrophy. No pancreatic or peripancreatic fluid. Spleen: Unremarkable. Adrenals/Urinary Tract: Bilateral kidneys and bilateral adrenal glands are unremarkable in appearance. No hydroureteronephrosis. In the dependent portion of the urinary bladder there is a 12 mm calculus (image 254 of series 5). Urinary bladder is otherwise unremarkable in appearance. Stomach/Bowel: Normal appearance of the stomach. No pathologic dilatation of small bowel or colon. Numerous colonic diverticulae are noted, without surrounding inflammatory changes to suggest an acute diverticulitis at this time. The appendix is not confidently identified and may be surgically absent. Regardless, there are no inflammatory changes noted adjacent to the cecum to suggest the presence of an acute appendicitis at this time. Vascular/Lymphatic:  Aortic atherosclerosis with vascular findings and measurements pertinent to potential TAVR procedure, as detailed below. No aneurysm or dissection identified in the abdominal or pelvic vasculature. Celiac axis, superior mesenteric artery and inferior mesenteric artery are all widely patent without hemodynamically significant stenosis. Single renal arteries bilaterally are widely patent. Portal vein is mildly dilated measuring 16 mm in the porta hepatis. Reproductive: Prostate gland and seminal vesicles are unremarkable in appearance. Other: Small left inguinal hernia containing a short segment of small bowel. No significant volume of ascites. No pneumoperitoneum. Musculoskeletal: There are no aggressive appearing lytic or blastic lesions noted in the visualized portions of the skeleton. VASCULAR MEASUREMENTS PERTINENT TO TAVR: AORTA: Minimal Aortic Diameter -  18 x 14 mm Severity of Aortic Calcification -  moderate RIGHT PELVIS: Right Common Iliac Artery - Minimal Diameter - 11.0 x 9.1 mm Tortuosity - moderate Calcification - mild to moderate Right External Iliac Artery - Minimal Diameter - 8.7 x 8.9 mm Tortuosity - moderate to severe Calcification - none Right Common Femoral Artery - Minimal Diameter - 9.2 x 6.8 mm Tortuosity - mild Calcification - mild LEFT PELVIS: Left Common Iliac Artery - Minimal Diameter - 9.5 x 10.0 mm Tortuosity - severe Calcification - none Left External Iliac Artery - Minimal Diameter - 8.1 x 7.9 mm Tortuosity - moderate Calcification - none Left Common Femoral Artery - Minimal Diameter - 8.5 x 6.3 mm Tortuosity - mild Calcification - mild Review of the MIP images confirms the above findings. IMPRESSION: 1. Vascular findings and measurements pertinent to potential TAVR procedure, as detailed above. This patient appears to have suitable pelvic arterial access, however, this access is most optimal on the right side secondary to the lesser degree of tortuosity. Additionally, there is a small  left inguinal hernia containing a short segment of small bowel which comes in close proximity to the left common femoral artery. 2. Severe thickening calcification of what appears to be a bicuspid aortic valve, compatible with the reported clinical history of severe aortic stenosis. 3. Interval pericardiocentesis with persistent indwelling pericardiocentesis catheter, with significant decreased size of what is now only a small amount of residual pericardial fluid and/or thickening. 4. Large filling defect in the tip of the left atrial appendage, concerning for potential left atrial appendage thrombus (although this could simply reflect pseudo thrombus secondary to contrast bolus timing). Correlation with TEE is recommended, if not already performed. 5. Aortic atherosclerosis, in addition to 2 vessel coronary artery disease. Please note that although the presence of coronary artery calcium documents the presence of coronary artery disease, the severity of this disease and any potential stenosis cannot be assessed on this non-gated CT examination. Assessment for potential risk factor modification, dietary therapy or pharmacologic therapy may be warranted, if clinically indicated. 6. Persistent large right-sided pleural effusion with  extensive passive atelectasis in the right lower lobe. Trace left pleural effusion with minimal dependent passive atelectasis in the left lower lobe. 7. Cholelithiasis without evidence of acute cholecystitis. 8. Colonic diverticulosis without evidence of acute diverticulitis at this time. 9. Morphologic changes in the liver compatible with underlying cirrhosis. 10. Additional incidental findings, as above. Electronically Signed   By: Trudie Reed M.D.   On: 09/03/2016 12:24    PHYSICAL EXAM CVP 8 General: NAD Neck: JVP 8 cm Lungs: Decreased breath sounds on right.  CV: Nondisplaced PMI.  Heart irregular S1/S2, no S3/S4, 2/6 SEM RUSB.  No edema. R chest tube in place.  Abdomen:  Soft, NT, ND, no HSM. No bruits or masses. +BS  Neurologic: Alert and oriented x 3.  Psych: Normal affect. Extremities: No clubbing or cyanosis. RUE PICC  TELEMETRY: Reviewed, A fib 90s  ASSESSMENT AND PLAN: 65 y/o ?with a h/o severe AS, LV dysfxn, HTN, noncompliance, thyroid nodules, pericardial effusion, cholelithiasis, and persistent AF on eliquis, who presentedto clinic on 08/23/16 with volume overload and was admitted for further work up of AS and CHF.  1. Acute on chronic systolic CHF: EF 93-79% in setting of severe aortic stenosis.  May be due to severe aortic stenosis given lack of flow-limiting CAD.  This admission, he was initially hypotensive with SBP in 80s though BP has improved to 100s-110s range.  He was also markedly volume overloaded. PICC was placed, low output confirmed by co-ox 47%.  Milrinone 0.25 started then increased to 0.375, now down to 0.25.  Repeat echo post-TAVR with EF up to 45%.  Repeat limited echo 10/26 with small pericardial effusion, no tamponade.  Developed suspected septic shock most recently, BP better but remains on pressors.  Today, CVP 8 with co-ox 63%.  - Off Lasix now.    - Stopped spironolactone, digoxin, and lisinopril.  - Continue milrinone at 0.25 for now.  - SBP 130s-140s on arterial line.  Wean vasopressin this morning, then can work on weaning norepinephrine.     2. Hypotension:  Pleural effusion drained, no pericardial tamponade, EF up to 45% and valve functioning ok (mildly elevated gradient).  Co-ox has been ok.  Suspect septic shock with rise in WBCs and high procalcitonin though no fever.  ?Right-sided PNA.  Blood cultures no growth so far.  Added empiric antibiotics with vancomycin/Zosyn.  Weaning off pressors this morning.  3. Aortic stenosis: Severe AS. 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 McAlhany and [place]. - Bioprosthetic valve stable on post-op echo though mean gradient a bit high at 20 mmHg.  4. AKI on CKD stage III: Baseline creatinine  1.7-1.8. Increased from 1.2 => 2.2 => 3.7 => 3.22 in setting of hypotension, suspect AKI. BP much improved today, hopefully will start to recover.    5. Atrial fibrillation: Persistent, with RVR this admission.  He has been in atrial fibrillation at least since 6/17.  No prior DCCV.  Not sure how long prior to 6/17 he was in atrial fibrillation.  - Continue amiodarone gtt.    - He had developing thrombus on TEE => however, heparin now held with bleeding into his right chest.   - Will need to restart anticoagulation eventually but still with significant chest tube output.  Will follow with surgery to determine when to restart coumadin (?today).  With LAA thrombus despite Eliquis, will use Coumadin for home. Eventual goal 2.5 - 3.0. - Will not be able to do DCCV at this point with atrial thrombus.  6.  Pericardial effusion: Moderate to large, no tamponade.  s/p pericardiocentesis with Dr Clifton James 10/20. Pericardial drain removed 09/05/16.  Repeat echo 10/27 with small effusion, no tamponade.   7. Cirrhosis: Noted on prior abdominal CT, LFTs improved with diuresis.  8. CAD:  LHC with moderate coronary disease, nothing flow-limiting.  - Continue statin and 81 mg aspirin.   40 minutes critical care time  Marca Ancona 09/06/2016

## 2016-09-10 ENCOUNTER — Inpatient Hospital Stay (HOSPITAL_COMMUNITY): Payer: Commercial Managed Care - HMO

## 2016-09-10 DIAGNOSIS — R0902 Hypoxemia: Secondary | ICD-10-CM

## 2016-09-10 DIAGNOSIS — J189 Pneumonia, unspecified organism: Secondary | ICD-10-CM

## 2016-09-10 LAB — CBC WITH DIFFERENTIAL/PLATELET
BASOS PCT: 0 %
Basophils Absolute: 0 10*3/uL (ref 0.0–0.1)
Eosinophils Absolute: 0 10*3/uL (ref 0.0–0.7)
Eosinophils Relative: 0 %
HEMATOCRIT: 24.8 % — AB (ref 39.0–52.0)
HEMOGLOBIN: 8.3 g/dL — AB (ref 13.0–17.0)
LYMPHS ABS: 0.2 10*3/uL — AB (ref 0.7–4.0)
LYMPHS PCT: 2 %
MCH: 26.8 pg (ref 26.0–34.0)
MCHC: 33.5 g/dL (ref 30.0–36.0)
MCV: 80 fL (ref 78.0–100.0)
MONO ABS: 0.6 10*3/uL (ref 0.1–1.0)
MONOS PCT: 6 %
NEUTROS ABS: 9.8 10*3/uL — AB (ref 1.7–7.7)
NEUTROS PCT: 93 %
Platelets: 92 10*3/uL — ABNORMAL LOW (ref 150–400)
RBC: 3.1 MIL/uL — ABNORMAL LOW (ref 4.22–5.81)
RDW: 17.6 % — AB (ref 11.5–15.5)
WBC: 10.6 10*3/uL — ABNORMAL HIGH (ref 4.0–10.5)

## 2016-09-10 LAB — BASIC METABOLIC PANEL
Anion gap: 8 (ref 5–15)
BUN: 39 mg/dL — AB (ref 6–20)
CALCIUM: 8.2 mg/dL — AB (ref 8.9–10.3)
CHLORIDE: 93 mmol/L — AB (ref 101–111)
CO2: 23 mmol/L (ref 22–32)
CREATININE: 2.94 mg/dL — AB (ref 0.61–1.24)
GFR calc non Af Amer: 21 mL/min — ABNORMAL LOW (ref >60)
GFR, EST AFRICAN AMERICAN: 24 mL/min — AB (ref >60)
GLUCOSE: 129 mg/dL — AB (ref 65–99)
Potassium: 4.7 mmol/L (ref 3.5–5.1)
Sodium: 124 mmol/L — ABNORMAL LOW (ref 135–145)

## 2016-09-10 LAB — IRON AND TIBC
Iron: 67 ug/dL (ref 45–182)
SATURATION RATIOS: 19 % (ref 17.9–39.5)
TIBC: 353 ug/dL (ref 250–450)
UIBC: 286 ug/dL

## 2016-09-10 LAB — PROCALCITONIN: Procalcitonin: 1.88 ng/mL

## 2016-09-10 LAB — PROTIME-INR
INR: 1.52
Prothrombin Time: 18.4 seconds — ABNORMAL HIGH (ref 11.4–15.2)

## 2016-09-10 LAB — PHOSPHORUS: Phosphorus: 4.3 mg/dL (ref 2.5–4.6)

## 2016-09-10 LAB — COOXEMETRY PANEL
CARBOXYHEMOGLOBIN: 2.1 % — AB (ref 0.5–1.5)
Methemoglobin: 0.7 % (ref 0.0–1.5)
O2 SAT: 55 %
TOTAL HEMOGLOBIN: 8.1 g/dL — AB (ref 12.0–16.0)

## 2016-09-10 LAB — T3, FREE: T3, Free: 1.6 pg/mL — ABNORMAL LOW (ref 2.0–4.4)

## 2016-09-10 LAB — FERRITIN: FERRITIN: 476 ng/mL — AB (ref 24–336)

## 2016-09-10 MED ORDER — WARFARIN - PHARMACIST DOSING INPATIENT
Freq: Every day | Status: DC
  Administered 2016-09-11: 18:00:00

## 2016-09-10 MED ORDER — FUROSEMIDE 10 MG/ML IJ SOLN
40.0000 mg | Freq: Two times a day (BID) | INTRAMUSCULAR | Status: DC
  Administered 2016-09-10 – 2016-09-11 (×4): 40 mg via INTRAVENOUS
  Filled 2016-09-10 (×4): qty 4

## 2016-09-10 MED ORDER — WARFARIN SODIUM 7.5 MG PO TABS
7.5000 mg | ORAL_TABLET | Freq: Once | ORAL | Status: AC
  Administered 2016-09-10: 7.5 mg via ORAL
  Filled 2016-09-10: qty 1

## 2016-09-10 NOTE — Progress Notes (Signed)
Patient ID: Lucas Mannerslan Klem, male   DOB: 04/10/1951, 65 y.o.   MRN: [id number]  [facility] DAILY ICU PROGRESS NOTE                   [address]            [place] [id number]          [phone]   5 Days Post-Op Procedure(s) (LRB): TRANSCATHETER AORTIC VALVE REPLACEMENT, TRANSFEMORAL (N/A) TRANSESOPHAGEAL ECHOCARDIOGRAM (TEE) (N/A) CHEST TUBE INSERTION (Right)  Total Length of Stay:  LOS: 17 days   Subjective: Awake and alert , feels better  Objective: Vital signs in last 24 hours: Temp:  [97.4 F (36.3 C)-97.7 F (36.5 C)] 97.5 F (36.4 C) (10/29 0812) Pulse Rate:  [86-104] 86 (10/29 0700) Cardiac Rhythm: Atrial fibrillation (10/29 0400) Resp:  [0-22] 12 (10/29 0700) BP: (78-111)/(52-69) 80/53 (10/28 2000) SpO2:  [96 %-100 %] 100 % (10/29 0500) Weight:  [187 lb 9.8 oz (85.1 kg)] 187 lb 9.8 oz (85.1 kg) (10/29 0500)  Filed Weights   09/08/16 0424 09/09/16 0515 09/10/16 0500  Weight: 181 lb (82.1 kg) 187 lb 3.2 oz (84.9 kg) 187 lb 9.8 oz (85.1 kg)    Weight change: 6.6 oz (0.187 kg)   Hemodynamic parameters for last 24 hours: CVP:  [9 mmHg-21 mmHg] 19 mmHg CO:  [9 L/min-10 L/min] 9 L/min  Intake/Output from previous day: 10/28 0701 - 10/29 0700 In: 1203.7 [P.O.:300; I.V.:753.7; IV Piggyback:150] Out: 940 [Urine:700; Chest Tube:240]  Intake/Output this shift: No intake/output data recorded.  Current Meds: Scheduled Meds: . sodium chloride   Intravenous Once  . sodium chloride   Intravenous Once  . acetaminophen  1,000 mg Oral Q6H   Or  . acetaminophen (TYLENOL) oral liquid 160 mg/5 mL  1,000 mg Per Tube Q6H  . acetaminophen (TYLENOL) oral liquid 160 mg/5 mL  650 mg Per Tube Once   Or  . acetaminophen  650 mg Rectal Once  . aspirin  81 mg Oral Daily  . atorvastatin  40 mg Oral q1800  . furosemide  40 mg Intravenous BID  . hydrocortisone sodium succinate  50 mg Intravenous Q6H  . mouth rinse  15 mL Mouth Rinse BID  . pantoprazole  40 mg Oral Daily    . piperacillin-tazobactam (ZOSYN)  IV  3.375 g Intravenous Q8H  . polyethylene glycol  17 g Oral Daily  . sodium chloride flush  10-40 mL Intracatheter Q12H  . sodium chloride flush  3 mL Intravenous Q12H  . vancomycin  1,000 mg Intravenous Q48H  . warfarin  7.5 mg Oral ONCE-1800  . Warfarin - Pharmacist Dosing Inpatient   Does not apply q1800   Continuous Infusions: . amiodarone 30 mg/hr (09/10/16 0400)  . milrinone 0.25 mcg/kg/min (09/10/16 0400)  . norepinephrine (LEVOPHED) Adult infusion Stopped (09/09/16 1618)  . vasopressin (PITRESSIN) infusion - *FOR SHOCK* Stopped (09/09/16 1618)   PRN Meds:.sodium chloride, Place/Maintain arterial line **AND** sodium chloride, acetaminophen, alum & mag hydroxide-simeth, docusate sodium, lactulose, morphine injection, ondansetron (ZOFRAN) IV, ondansetron (ZOFRAN) IV, oxyCODONE, oxyCODONE-acetaminophen, sodium chloride flush, sodium chloride flush, traMADol, traZODone, white petrolatum  General appearance: alert and cooperative Neurologic: intact Heart: regular rate and rhythm Lungs: diminished breath sounds bibasilar Abdomen: soft, non-tender; bowel sounds normal; no masses,  no organomegaly Extremities: extremities normal, atraumatic, no cyanosis or edema and Homans sign is negative, no sign of DVT  Lab Results: CBC: Recent Labs  09/09/16 0420 09/10/16 0400  WBC 14.3* 10.6*  HGB 8.7* 8.3*  HCT 26.2* 24.8*  PLT 109* 92*   BMET:  Recent Labs  09/09/16 0420 09/10/16 0400  NA 124* 124*  K 4.9 4.7  CL 94* 93*  CO2 23 23  GLUCOSE 131* 129*  BUN 33* 39*  CREATININE 3.22* 2.94*  CALCIUM 7.8* 8.2*    CMET: Lab Results  Component Value Date   WBC 10.6 (H) 09/10/2016   HGB 8.3 (L) 09/10/2016   HCT 24.8 (L) 09/10/2016   PLT 92 (L) 09/10/2016   GLUCOSE 129 (H) 09/10/2016   ALT 22 09/08/2016   AST 31 09/08/2016   NA 124 (L) 09/10/2016   K 4.7 09/10/2016   CL 93 (L) 09/10/2016   CREATININE 2.94 (H) 09/10/2016   BUN 39 (H)  09/10/2016   CO2 23 09/10/2016   TSH 2.367 09/08/2016   INR 1.52 09/10/2016   HGBA1C 6.0 (H) 09/08/2016    PT/INR:  Recent Labs  09/10/16 0400  LABPROT 18.4*  INR 1.52   Radiology: Dg Chest Port 1 View  Result Date: 09/10/2016 CLINICAL DATA:  Chest congestion.  Cough. EXAM: PORTABLE CHEST 1 VIEW COMPARISON:  Yesterday. FINDINGS: Stable enlarged cardiac silhouette and transcatheter aortic valve. The right PICC is unchanged. A right chest tube remains in place with no pneumothorax. The pulmonary vasculature and interstitial markings remain prominent. Thoracic spine degenerative changes. IMPRESSION: Stable cardiomegaly and changes of congestive heart failure. Electronically Signed   By: Beckie Salts M.D.   On: 09/10/2016 08:14     Assessment/Plan: S/P Procedure(s) (LRB): TRANSCATHETER AORTIC VALVE REPLACEMENT, TRANSFEMORAL (N/A) TRANSESOPHAGEAL ECHOCARDIOGRAM (TEE) (N/A) CHEST TUBE INSERTION (Right) Drips per cardiology Still too much from right chest tube to remove- leave for now Slowing resume coumadin,  Hyponatremia - fluid restriction started  I have seen and examined Lucas Munoz and agree with the above assessment  and plan.  Delight Ovens MD [facility] [phone] Office [phone] 09/10/2016 9:51 AM       Delight Ovens 09/10/2016 9:04 AM

## 2016-09-10 NOTE — Progress Notes (Signed)
Patient ID: Lucas Munoz, male   DOB: 01-17-1951, 65 y.o.   MRN: [id number]    SUBJECTIVE:   PICC placed , initial co-ox on 10/14 47%.  He was started on milrinone. Milrinone increased to 0.375 mcg 08/29/16 for low mixed venous sat on RHC (CI 1.9).  Underwent pericardiocentesis on 10/20 with 775cc out. Fluid transudative. Drained another 225 cc yesterday  TEE 10/23 confirmed amorphous material in LA appendage, appears to be forming thrombus.  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   Post-TAVR echo 10/25 with EF up to 45%, moderate LVH, bioprosthetic AVR with mean gradient 20 (higher than expected), PASP 37, normal RV, no pericardial effusion.   Overnight 10/26 developed nausea/vomiting and hypotension.  He was started on norepinephrine and IV fluid with low CVP. CXR showed large right pleural effusion accumulation. Chest tube replaced on right, 1300 cc bloody fluid out.  Heparin stopped. He remained hypotensive.  1 unit PRBCs given. Also noted rise in creatinine with poor UOP.  Echo 10/26 with EF about 45%, small pericardial effusion with no tamponade.   PCT up to 4.63, possible HCAP.  Suspect septic shock.   Doing better today.  Off pressors now.  Creatinine 3.71 => 3.22 => 2.94.  CVP 12-13 with co-ox 55%.     RHC/LHC (08/29/16) Left Main  Mild ostial narrowing.  Left Anterior Descending  30% proximal LAD stenosis. 60% distal LAD stenosis. Moderate D1 without significant disease. Small to moderate D2 with long 70% ostial stenosis.  Left Circumflex  50% proximal LCx stenosis at OM1. Moderate OM1 with 30% ostial stenosis.  Right Coronary Artery  40% proximal and 40% mid RCA stenosis.   RA mean 19 RV 65/15 PA 69/34, mean 51 PCWP mean 43 (not sure we got an accurate PCWP).  AO 106/87 Oxygen saturations: PA 46% AO 92% Cardiac Output (Fick) 3.8  Cardiac Index (Fick) 1.9  Echo (10/14): EF 15%, severe AS, moderately decreased RV systolic  function, PASP 65 mmHg, moderate to large pericardial effusion without tamponade.   Scheduled Meds: . sodium chloride   Intravenous Once  . sodium chloride   Intravenous Once  . acetaminophen  1,000 mg Oral Q6H   Or  . acetaminophen (TYLENOL) oral liquid 160 mg/5 mL  1,000 mg Per Tube Q6H  . acetaminophen (TYLENOL) oral liquid 160 mg/5 mL  650 mg Per Tube Once   Or  . acetaminophen  650 mg Rectal Once  . aspirin  81 mg Oral Daily  . atorvastatin  40 mg Oral q1800  . furosemide  40 mg Intravenous BID  . hydrocortisone sodium succinate  50 mg Intravenous Q6H  . mouth rinse  15 mL Mouth Rinse BID  . pantoprazole  40 mg Oral Daily  . piperacillin-tazobactam (ZOSYN)  IV  3.375 g Intravenous Q8H  . polyethylene glycol  17 g Oral Daily  . sodium chloride flush  10-40 mL Intracatheter Q12H  . sodium chloride flush  3 mL Intravenous Q12H  . vancomycin  1,000 mg Intravenous Q48H   Continuous Infusions: . amiodarone 30 mg/hr (09/10/16 0400)  . milrinone 0.25 mcg/kg/min (09/10/16 0400)  . norepinephrine (LEVOPHED) Adult infusion Stopped (09/09/16 1618)  . vasopressin (PITRESSIN) infusion - *FOR SHOCK* Stopped (09/09/16 1618)   PRN Meds:.sodium chloride, Place/Maintain arterial line **AND** sodium chloride, acetaminophen, alum & mag hydroxide-simeth, docusate sodium, lactulose, morphine injection, ondansetron (ZOFRAN) IV, ondansetron (ZOFRAN) IV, oxyCODONE, oxyCODONE-acetaminophen, sodium chloride flush, sodium chloride flush, traMADol, traZODone, white petrolatum  Vitals:   09/10/16 0404 09/10/16 0500 09/10/16 0600 09/10/16 0700  BP:      Pulse:  88  86  Resp:  11 (!) 0 12  Temp: 97.7 F (36.5 C)     TempSrc: Oral     SpO2:  100%    Weight:  187 lb 9.8 oz (85.1 kg)    Height:        Intake/Output Summary (Last 24 hours) at 09/10/16 0734 Last data filed at 09/10/16 0700  Gross per 24 hour  Intake          1203.73 ml  Output              940 ml  Net           263.73 ml     LABS: Basic Metabolic Panel:  Recent Labs  40/98/1110/28/17 0420 09/10/16 0400  NA 124* 124*  K 4.9 4.7  CL 94* 93*  CO2 23 23  GLUCOSE 131* 129*  BUN 33* 39*  CREATININE 3.22* 2.94*  CALCIUM 7.8* 8.2*  MG 2.7*  --   PHOS 4.2 4.3   Liver Function Tests:  Recent Labs  09/08/16 0230 09/09/16 0420  AST 31  --   ALT 22  --   ALKPHOS 102  --   BILITOT 2.4*  --   PROT 5.7*  --   ALBUMIN 2.4* 2.1*   No results for input(s): LIPASE, AMYLASE in the last 72 hours. CBC:  Recent Labs  09/09/16 0420 09/10/16 0400  WBC 14.3* 10.6*  NEUTROABS 11.5* 9.8*  HGB 8.7* 8.3*  HCT 26.2* 24.8*  MCV 79.9 80.0  PLT 109* 92*   Cardiac Enzymes: No results for input(s): CKTOTAL, CKMB, CKMBINDEX, TROPONINI in the last 72 hours. BNP: Invalid input(s): POCBNP D-Dimer: No results for input(s): DDIMER in the last 72 hours. Hemoglobin A1C:  Recent Labs  09/08/16 1507  HGBA1C 6.0*   Fasting Lipid Panel: No results for input(s): CHOL, HDL, LDLCALC, TRIG, CHOLHDL, LDLDIRECT in the last 72 hours. Thyroid Function Tests:  Recent Labs  09/08/16 1507  TSH 2.367   Anemia Panel: No results for input(s): VITAMINB12, FOLATE, FERRITIN, TIBC, IRON, RETICCTPCT in the last 72 hours.  RADIOLOGY: [facility]  Result Date: 09/08/2016 CLINICAL DATA:  Acute renal failure EXAM: RENAL / URINARY TRACT ULTRASOUND COMPLETE COMPARISON:  CT abdomen pelvis 04/11/2016 FINDINGS: Right Kidney: Length: 10.8 cm. Negative for obstruction or mass. Limited evaluation the right kidney due to chest tube and bandages. Left Kidney: Length: 12.2 cm. Echogenicity within normal limits. No mass or hydronephrosis visualized. Bladder: Appears normal for degree of bladder distention. IMPRESSION: Negative renal ultrasound. Electronically Signed   By: Marlan Palauharles  Clark M.D.   On: 09/08/2016 16:09   Ct Cardiac Morph/pulm Vein W/cm&w/o Ca Score  Addendum Date: 09/01/2016   ADDENDUM REPORT: 09/01/2016 17:18 CLINICAL DATA:  Aortic  stenosis EXAM: Cardiac TAVR CT TECHNIQUE: The patient was scanned on a Philips 256 scanner. A 120 kV retrospective scan was triggered in the descending thoracic aorta at 111 HU's. Gantry rotation speed was 270 msecs and collimation was .9 mm. No beta blockade or nitro were given. The 3D data set was reconstructed in 5% intervals of the R-R cycle. Systolic and diastolic phases were analyzed on a dedicated work station using MPR, MIP and VRT modes. The patient received 80 cc of contrast. 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 Heavily calcified Aorta:  Aortic root dilated Sinotubular Junction:  32 mm  Ascending Thoracic Aorta:  43 mm Aortic Arch:  30 mm Descending Thoracic Aorta:  26 mm Sinus of Valsalva Measurements: Non-coronary:  33 mm Right -coronary:  31 mm Left -coronary:  32 mm Coronary Artery Height above Annulus: Left Main:  19 mm above annulus Right Coronary:  10 mm above annulus Virtual Basal Annulus Measurements: Maximum/Minimum Diameter:  27.1 x 20.3 mm Perimeter:  78 mm Area:  442 mm2 Coronary Arteries:  Sufficient height above annulus for deployment Optimum Fluoroscopic Angle for Delivery:  LAO 29 degrees IMPRESSION: 1) functionally bicuspid AV heavily calcified with annulus suitable for 26 mm Sapien 3 valve 2) Moderate aortic root enlargement 4.3 cm 3) Large pericardial effusion patient to have pericardiocentesis post CT 4) Cannot r/o LAA thrombus suggest TEE correlation 5) Optimum angiographic angle for deployment LAO 29 degrees 6) Coronary heights sufficient for deployment Charlton Haws Electronically Signed   By: Charlton Haws M.D.   On: 09/01/2016 17:18   Result Date: 09/01/2016 EXAM: OVER-READ INTERPRETATION  CT CHEST The following report is an over-read performed by radiologist Dr. Royal Piedra [facility] on 09/01/2016. This over-read does not include interpretation of cardiac or coronary anatomy or pathology. The coronary calcium  score/coronary CTA interpretation by the cardiologist is attached. COMPARISON:  Chest CT 05/01/2016. FINDINGS: Large pericardial effusion. No pericardial calcification. Aortic atherosclerosis with aneurysmal dilatation of the ascending thoracic aorta which measures up to 4.8 cm in diameter (unchanged). Large right-sided pleural effusion layering dependently. Extensive passive atelectasis in the right lower lobe. Tiny calcified granuloma in the periphery of the right lower lobe. 4 mm subpleural noncalcified pulmonary nodule in the left lower lobe abutting the major fissure (image 71 of series 512), unchanged compared to prior study 05/01/2016, favored to represent a subpleural lymph node. No other suspicious appearing pulmonary nodules or masses are noted in the visualized portions of the thorax. Partially calcified pleural plaque in the right hemithorax (image 31 of series 513) again noted. No calcified pleural plaques in the left hemithorax. Visualized portions of the upper abdomen demonstrate a nodular contour of the liver, suggestive of underlying cirrhosis. No aggressive appearing lytic or blastic lesions are noted in the visualized portions of the skeleton. Right upper extremity PICC with tip terminating in the distal superior vena cava. IMPRESSION: 1. Interval increased size of what is now a large pericardial effusion. No associated pericardial calcification. 2. Enlargement of a large layering right-sided pleural effusion. This is associated with extensive passive atelectasis in the right lower lobe. 3. Aortic atherosclerosis, with similar aneurysmal dilatation of the ascending thoracic aorta which measures up to 4.8 cm in diameter. 4. Morphologic changes in the liver indicative of cirrhosis, as above. 5. 4 mm subpleural nodule in the periphery of the left lower lobe is nonspecific, but statistically likely a subpleural lymph node. No follow-up needed if patient is low-risk. Non-contrast chest CT can be  considered in 12 months if patient is high-risk. This recommendation follows the consensus statement: Guidelines for Management of Incidental Pulmonary Nodules Detected on CT Images: From the Fleischner Society 2017; Radiology 2017; 284:228-243. 6. Additional incidental findings, as above. Electronically Signed: By: Trudie Reed M.D. On: 09/01/2016 14:38   Dg Chest Port 1 View  Result Date: 09/09/2016 CLINICAL DATA:  Status post TAVR and right chest tube placement for hemothorax. EXAM: PORTABLE CHEST 1 VIEW COMPARISON:  09/08/2016 FINDINGS: Right chest tube remains in place with no significant pleural fluid volume. No pneumothorax. There remains atelectasis in the right lower lung. PICC  line positioning stable in the distal SVC. Stable cardiac enlargement and appearance of transcatheter aortic valve. Suspect mild residual interstitial edema which appears stable. IMPRESSION: No significant residual right pleural fluid volume with atelectasis remaining in the right lower lung. Stable mild interstitial edema. Electronically Signed   By: Irish Lack M.D.   On: 09/09/2016 09:13   Dg Chest Port 1 View  Result Date: 09/08/2016 CLINICAL DATA:  Shortness of breath.  CHF. EXAM: PORTABLE CHEST 1 VIEW COMPARISON:  09/07/2016. FINDINGS: Right PICC line and right chest tube in stable position. No pneumothorax. Right pleural effusion is improved slightly. Cardiomegaly with diffuse pulmonary interstitial prominence mild congestive heart failure cannot be excluded . IMPRESSION: 1. Right PICC line right chest tube in stable position. No pneumothorax. Right pleural effusion has improved slightly. 2. With bilateral from interstitial prominence. Mild congestive heart failure cannot be excluded . Electronically Signed   By: Maisie Fus  Register   On: 09/08/2016 08:48   Dg Chest Port 1 View  Result Date: 09/07/2016 CLINICAL DATA:  Hemothorax with new right chest tube. EXAM: PORTABLE CHEST 1 VIEW COMPARISON:  Earlier  today FINDINGS: Decreased but still extensive right hemothorax after chest tube placement. No visualized gas component. Right upper extremity PICC with tip at the SVC level. Unchanged cardiopericardial enlargement. Status post transcatheter aortic valve replacement. IMPRESSION: 1. Right chest tube placement with diminished hemothorax. 2. Elsewhere stable chest. Electronically Signed   By: Marnee Spring M.D.   On: 09/07/2016 09:05   Dg Chest Port 1 View  Result Date: 09/07/2016 CLINICAL DATA:  Vomiting EXAM: PORTABLE CHEST 1 VIEW COMPARISON:  Yesterday FINDINGS: Stable right upper extremity PICC. Swan-Ganz catheter and right jugular introducer have been removed. Right chest tube is been removed. A large right pleural effusion has accumulated. There is compressive atelectasis within much of the right lung. Vascular congestion. Left lung is otherwise clear. Cardiomegaly. Small right pneumothorax is suspected. IMPRESSION: Right chest tube removed. Large right pleural effusion has reaccumulated. Very small right apical pneumothorax has also developed. Electronically Signed   By: Jolaine Click M.D.   On: 09/07/2016 07:39   Dg Chest Port 1 View  Result Date: 09/06/2016 CLINICAL DATA:  Cardiac surgery EXAM: PORTABLE CHEST 1 VIEW COMPARISON:  Yesterday FINDINGS: Right upper extremity PICC, right chest tube, right jugular introducer, right Swan-Ganz catheter are stable. There is no pneumothorax. Small right pleural effusion is stable. Vascular congestion and mild edema are worse. Lower lung volumes. Cardiac silhouette remains markedly enlarged. IMPRESSION: Worsening vascular congestion and edema. No pneumothorax. Electronically Signed   By: Jolaine Click M.D.   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 Postoperative image. EXAM: PORTABLE CHEST 1 VIEW COMPARISON:  CT chest 09/01/2016 and single view of the chest 08/26/2016.  FINDINGS: Endotracheal tube is in place with tip in good position at the level of the clavicular heads. Right IJ approach Swan-Ganz catheter tip is in the proximal right main pulmonary artery. Tip of right PICC projects in the lower superior vena cava. NG tube courses into the stomach. Right chest tube is identified. There is a small right pleural effusion and basilar atelectasis, improved since the prior chest film. The lungs are otherwise clear. The cardiopericardial silhouette is enlarged. No pneumothorax. IMPRESSION: Support tubes and lines projecting good position. Negative for pneumothorax. Decreased small right pleural effusion and basilar atelectasis. Enlarged cardiopericardial silhouette consistent with cardiomegaly and pericardial effusion as  seen on CT scan. Electronically Signed   By: Drusilla Kanner M.D.   On: 09/05/2016 13:56   Dg Chest Port 1 View  Result Date: 08/26/2016 CLINICAL DATA:  65 year old male with a history of shortness of breath EXAM: PORTABLE CHEST 1 VIEW COMPARISON:  08/25/2016, CT chest 05/01/2016 FINDINGS: Compare to the prior plain film there is unchanged size of the cardiac silhouette. Calcifications of the aortic arch. Interval placement of right upper extremity PICC with the tip appearing to terminate at the superior cavoatrial junction. Increasing opacity at the right base with thickening of the minor fissure, obscuration the right hemidiaphragm and right heart border. Interlobular septal thickening. IMPRESSION: Evidence of CHF with increasing right-sided pleural effusion/atelectasis. Unchanged cardiac silhouette in this patient with a history of pericardial effusion imaged on prior CT 05/01/2016. Interval placement of right upper extremity PICC. Aortic atherosclerosis. Signed, Yvone Neu. Loreta Ave, DO [facility] Electronically Signed   By: Gilmer Mor D.O.   On: 08/26/2016 09:37   Dg Chest Port 1 View  Result  Date: 08/25/2016 CLINICAL DATA:  Acute on chronic systolic congestive heart failure. Dilated cardiomyopathy. Acute kidney injury. EXAM: PORTABLE CHEST 1 VIEW COMPARISON:  None. FINDINGS: Marked cardiac enlargement noted. Diffuse interstitial infiltrates consistent with mild interstitial edema. Small right pleural effusion also seen with right basilar atelectasis. IMPRESSION: Mild congestive heart failure, with small right pleural effusion and right basilar atelectasis. Electronically Signed   By: Myles Rosenthal M.D.   On: 08/25/2016 15:14   Ct Angio Chest/abd/pel For Dissection W And/or W/wo  Result Date: 09/03/2016 CLINICAL DATA:  65 year old male with history of severe aortic stenosis. Preprocedural study prior to potential transcatheter aortic valve replacement. Recent history of pericardiocentesis. EXAM: CT ANGIOGRAPHY CHEST, ABDOMEN AND PELVIS TECHNIQUE: Multidetector CT imaging through the chest, abdomen and pelvis was performed using the standard protocol during bolus administration of intravenous contrast. Multiplanar reconstructed images and MIPs were obtained and reviewed to evaluate the vascular anatomy. CONTRAST:  80 mL of Isovue 370. COMPARISON:  Cardiac gated CTA 09/01/2016. FINDINGS: CTA CHEST FINDINGS Cardiovascular: Heart size is mildly enlarged. Small amount of residual pericardial fluid and/or thickening, decreased compared to the prior study. Pericardiocentesis catheter in place with tip terminating high in a superior pericardial recess adjacent to the aortic arch. No pericardial calcification. Severe thickening calcification of the aortic valve, which has an appearance suggestive of a bicuspid valve. There is aortic atherosclerosis, as well as atherosclerosis of the great vessels of the mediastinum and the coronary arteries, including calcified atherosclerotic plaque in the left anterior descending and right coronary arteries. 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 Large filling defect within the left atrial appendage, concerning for potential left atrial appendage thrombus. Right upper extremity PICC with tip terminating at the superior cavoatrial junction. Mediastinum/Lymph Nodes: Several borderline enlarged and minimally enlarged mediastinal lymph nodes measuring up to 14 mm in short axis, slightly increased compared to the prior study, likely reactive. Esophagus is unremarkable in appearance. No axillary lymphadenopathy. Lungs/Pleura: Large right pleural effusion again noted, lying dependently. This is associated with near complete passive atelectasis of the right lower lobe. Trace left pleural effusion with minimal passive subsegmental atelectasis in the dependent left lower lobe. No acute consolidative airspace disease. Previously noted tiny subpleural known nodule in the anterior aspect of the left lower lobe is no longer noted, presumably a reactive subpleural lymph node on the prior examination. Partially calcified small right-sided pleural plaque is  unchanged (image 63 of series 5). No other calcified pleural plaques are identified. Tiny calcified granulomas are again noted in the periphery of the right lower lobe, now with an atelectatic lung. Musculoskeletal/Soft Tissues: There are no aggressive appearing lytic or blastic lesions noted in the visualized portions of the skeleton. CTA ABDOMEN AND PELVIS FINDINGS Hepatobiliary: Liver has a shrunken appearance and nodular contour, compatible with underlying cirrhosis. No discrete cystic or solid hepatic lesions. No intra or extrahepatic biliary ductal dilatation. Large rim calcified gallstone in the gallbladder measuring up to 3.8 cm in diameter. No current findings to suggest an acute cholecystitis at this time. Pancreas: No pancreatic mass or peripancreatic inflammatory changes. No pancreatic ductal dilatation. Pancreatic atrophy. No pancreatic or peripancreatic fluid. Spleen: Unremarkable.  Adrenals/Urinary Tract: Bilateral kidneys and bilateral adrenal glands are unremarkable in appearance. No hydroureteronephrosis. In the dependent portion of the urinary bladder there is a 12 mm calculus (image 254 of series 5). Urinary bladder is otherwise unremarkable in appearance. Stomach/Bowel: Normal appearance of the stomach. No pathologic dilatation of small bowel or colon. Numerous colonic diverticulae are noted, without surrounding inflammatory changes to suggest an acute diverticulitis at this time. The appendix is not confidently identified and may be surgically absent. Regardless, there are no inflammatory changes noted adjacent to the cecum to suggest the presence of an acute appendicitis at this time. Vascular/Lymphatic: Aortic atherosclerosis with vascular findings and measurements pertinent to potential TAVR procedure, as detailed below. No aneurysm or dissection identified in the abdominal or pelvic vasculature. Celiac axis, superior mesenteric artery and inferior mesenteric artery are all widely patent without hemodynamically significant stenosis. Single renal arteries bilaterally are widely patent. Portal vein is mildly dilated measuring 16 mm in the porta hepatis. Reproductive: Prostate gland and seminal vesicles are unremarkable in appearance. Other: Small left inguinal hernia containing a short segment of small bowel. No significant volume of ascites. No pneumoperitoneum. Musculoskeletal: There are no aggressive appearing lytic or blastic lesions noted in the visualized portions of the skeleton. VASCULAR MEASUREMENTS PERTINENT TO TAVR: AORTA: Minimal Aortic Diameter -  18 x 14 mm Severity of Aortic Calcification -  moderate RIGHT PELVIS: Right Common Iliac Artery - Minimal Diameter - 11.0 x 9.1 mm Tortuosity - moderate Calcification - mild to moderate Right External Iliac Artery - Minimal Diameter - 8.7 x 8.9 mm Tortuosity - moderate to severe Calcification - none Right Common Femoral Artery -  Minimal Diameter - 9.2 x 6.8 mm Tortuosity - mild Calcification - mild LEFT PELVIS: Left Common Iliac Artery - Minimal Diameter - 9.5 x 10.0 mm Tortuosity - severe Calcification - none Left External Iliac Artery - Minimal Diameter - 8.1 x 7.9 mm Tortuosity - moderate Calcification - none Left Common Femoral Artery - Minimal Diameter - 8.5 x 6.3 mm Tortuosity - mild Calcification - mild Review of the MIP images confirms the above findings. IMPRESSION: 1. Vascular findings and measurements pertinent to potential TAVR procedure, as detailed above. This patient appears to have suitable pelvic arterial access, however, this access is most optimal on the right side secondary to the lesser degree of tortuosity. Additionally, there is a small left inguinal hernia containing a short segment of small bowel which comes in close proximity to the left common femoral artery. 2. Severe thickening calcification of what appears to be a bicuspid aortic valve, compatible with the reported clinical history of severe aortic stenosis. 3. Interval pericardiocentesis with persistent indwelling pericardiocentesis catheter, with significant decreased size of what is now only a small  amount of residual pericardial fluid and/or thickening. 4. Large filling defect in the tip of the left atrial appendage, concerning for potential left atrial appendage thrombus (although this could simply reflect pseudo thrombus secondary to contrast bolus timing). Correlation with TEE is recommended, if not already performed. 5. Aortic atherosclerosis, in addition to 2 vessel coronary artery disease. Please note that although the presence of coronary artery calcium documents the presence of coronary artery disease, the severity of this disease and any potential stenosis cannot be assessed on this non-gated CT examination. Assessment for potential risk factor modification, dietary therapy or pharmacologic therapy may be warranted, if clinically indicated. 6.  Persistent large right-sided pleural effusion with extensive passive atelectasis in the right lower lobe. Trace left pleural effusion with minimal dependent passive atelectasis in the left lower lobe. 7. Cholelithiasis without evidence of acute cholecystitis. 8. Colonic diverticulosis without evidence of acute diverticulitis at this time. 9. Morphologic changes in the liver compatible with underlying cirrhosis. 10. Additional incidental findings, as above. Electronically Signed   By: Trudie Reed M.D.   On: 09/03/2016 12:24    PHYSICAL EXAM CVP 12-13 General: NAD Neck: JVP 12 cm Lungs: Decreased breath sounds on right.  CV: Nondisplaced PMI.  Heart irregular S1/S2, no S3/S4, 2/6 SEM RUSB.  No edema. R chest tube in place.  Abdomen: Soft, NT, ND, no HSM. No bruits or masses. +BS  Neurologic: Alert and oriented x 3.  Psych: Normal affect. Extremities: No clubbing or cyanosis. RUE PICC  TELEMETRY: Reviewed, A fib 90s  ASSESSMENT AND PLAN: 65 y/o ?with a h/o severe AS, LV dysfxn, HTN, noncompliance, thyroid nodules, pericardial effusion, cholelithiasis, and persistent AF on eliquis, who presentedto clinic on 08/23/16 with volume overload and was admitted for further work up of AS and CHF.  1. Acute on chronic systolic CHF: EF 16-10% in setting of severe aortic stenosis.  May be due to severe aortic stenosis given lack of flow-limiting CAD.  This admission, he was initially hypotensive with SBP in 80s though BP has improved to 100s-110s range.  He was also markedly volume overloaded. PICC was placed, low output confirmed by co-ox 47%.  Milrinone 0.25 started then increased to 0.375, now down to 0.25.  Repeat echo post-TAVR with EF up to 45%.  Repeat limited echo 10/26 with small pericardial effusion, no tamponade.  Developed suspected septic shock most recently, BP better now and off pressors.  No Lasix for several days.  Today, CVP 12-13 with co-ox 55%.  BP stable.  - Restart Lasix 40 mg IV bid.      - Stopped spironolactone, digoxin, and lisinopril.  - Continue milrinone at 0.25 for now.  2. Hypotension:  Pleural effusion drained, no pericardial tamponade, EF up to 45% and valve functioning ok (mildly elevated gradient).  Co-ox has been ok.  Suspect septic shock with rise in WBCs and high procalcitonin though no fever.  ?Right-sided PNA.  Blood cultures no growth so far.  Added empiric antibiotics with vancomycin/Zosyn. Hydrocortisone added by CCM.  Now weaned off pressors and procalcitonin coming down.   3. Aortic stenosis: Severe AS. 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 McAlhany and [place]. - Bioprosthetic valve stable on post-op echo though mean gradient a bit high at 20 mmHg.  4. AKI on CKD stage III: Baseline creatinine 1.7-1.8. Increased from 1.2 => 2.2 => 3.7 => 3.22 in setting of hypotension, suspect AKI. Today, creatinine trending down to 2.94 with resolution of septic shock..    5. Atrial fibrillation: Persistent,  with RVR this admission.  He has been in atrial fibrillation at least since 6/17.  No prior DCCV.  Not sure how long prior to 6/17 he was in atrial fibrillation.  - Continue amiodarone gtt.    - He had developing thrombus on TEE => however, heparin now held with bleeding into his right chest.   - With LAA thrombus despite Eliquis, will use Coumadin for home. Eventual goal 2.5 - 3.0.  Restarting warfarin today without heparin as chest tube still in and draining.  - Will not be able to do DCCV at this point with atrial thrombus.  6. Pericardial effusion: Moderate to large, no tamponade.  s/p pericardiocentesis with Dr Clifton James 10/20. Pericardial drain removed 09/05/16.  Repeat echo 10/27 with small effusion, no tamponade.   7. Cirrhosis: Noted on prior abdominal CT, LFTs improved with diuresis.  8. CAD:  LHC with moderate coronary disease, nothing flow-limiting.  - Continue statin and 81 mg aspirin.  9. Hyponatremia: Fluid restrict.   35 minutes critical care time  Marca Ancona 09/06/2016

## 2016-09-10 NOTE — Progress Notes (Signed)
PULMONARY / CRITICAL CARE MEDICINE   Name: Lucas Munoz MRN: [id number] DOB: July 28, 1951    ADMISSION DATE:  08/24/2016 CONSULTATION DATE:  09/08/2016  REFERRING MD:  Marca Ancona, M.D. / Cardiology  CHIEF COMPLAINT:  Septic Shock  HISTORY OF PRESENT ILLNESS:  65 y.o. male with history of cardiomyopathy and severe aortic stenosis. Patient underwent pericardiocentesis on 10/20 in preparation for transient aortic valve replacement. This was completed on 10/24. Postprocedure patient has chest tube removal with subsequent development of right hemothorax requiring repeat chest tube insertion. Patient developed persistent hypotension as well as acute oliguric renal failure requiring escalating doses of vasopressor support. In the setting of leukocytosis and probable underlying sepsis PCCM was consulted to assist with medical management. Patient denies any chest pain, pressure, or palpitations. He reports his dyspnea is minimal. Denies any chronic cough but did have a minimal amount of mucus production today. Denies any subjective fever, chills, or sweats. Patient did have some mild nausea earlier relieved with Zofran. Denies any abdominal pain or emesis. He is slightly constipated and without diarrhea.  SUBJECTIVE:  Feeling much better today , less dyspnea Weaned off pressors  Walked in hall today  sats adequate on 2l/m    VITAL SIGNS: BP (!) 80/53 (BP Location: Left Arm)   Pulse (!) 115   Temp 97.5 F (36.4 C) (Oral)   Resp (!) 0   Ht 5\' 8"  (1.727 m)   Wt 187 lb 9.8 oz (85.1 kg)   SpO2 95%   BMI 28.53 kg/m   HEMODYNAMICS: CVP:  [9 mmHg-21 mmHg] 16 mmHg CO:  [9 L/min-10 L/min] 9 L/min  VENTILATOR SETTINGS:    INTAKE / OUTPUT: I/O last 3 completed shifts: In: 2320.8 [P.O.:420; I.V.:1600.8; IV Piggyback:300] Out: 1830 [Urine:1150; Chest Tube:680]  PHYSICAL EXAMINATION: General: awake alert in chair Neuro: nonfocal, appropriate, cn intact HEENT:jvd PULM: ant clear , bases coarse,  chest tube to sxn  CV: s1 s2 rrr GI: soft, bs wnl Extremities: edema min   LABS:  BMET  Recent Labs Lab 09/08/16 0230 09/09/16 0420 09/10/16 0400  NA 128* 124* 124*  K 4.7 4.9 4.7  CL 93* 94* 93*  CO2 21* 23 23  BUN 29* 33* 39*  CREATININE 3.71* 3.22* 2.94*  GLUCOSE 135* 131* 129*    Electrolytes  Recent Labs Lab 09/06/16 0357  09/08/16 0230 09/09/16 0420 09/10/16 0400  CALCIUM 8.2*  < > 8.3* 7.8* 8.2*  MG 1.7  --   --  2.7*  --   PHOS  --   --   --  4.2 4.3  < > = values in this interval not displayed.  CBC  Recent Labs Lab 09/08/16 0230 09/09/16 0420 09/10/16 0400  WBC 20.9* 14.3* 10.6*  HGB 11.2* 8.7* 8.3*  HCT 34.5* 26.2* 24.8*  PLT 186 109* 92*    Coag's  Recent Labs Lab 09/05/16 1400  09/08/16 0230 09/09/16 0420 09/10/16 0400  APTT 30  --   --   --   --   INR 1.21  < > 1.88 1.77 1.52  < > = values in this interval not displayed.  Sepsis Markers  Recent Labs Lab 09/08/16 0902 09/09/16 0420 09/10/16 0400  PROCALCITON 4.63 2.76 1.88    ABG  Recent Labs Lab 09/05/16 1350  PHART 7.420  PCO2ART 43.5  PO2ART 179.0*    Liver Enzymes  Recent Labs Lab 09/08/16 0230 09/09/16 0420  AST 31  --   ALT 22  --   ALKPHOS 102  --  BILITOT 2.4*  --   ALBUMIN 2.4* 2.1*    Cardiac Enzymes No results for input(s): TROPONINI, PROBNP in the last 168 hours.  Glucose  Recent Labs Lab 09/07/16 1252 09/07/16 1628 09/08/16 0807 09/08/16 1155 09/08/16 2004 09/08/16 2346  GLUCAP 110* 126* 126* 124* 189* 135*    Imaging Dg Chest Port 1 View  Result Date: 09/10/2016 CLINICAL DATA:  Chest congestion.  Cough. EXAM: PORTABLE CHEST 1 VIEW COMPARISON:  Yesterday. FINDINGS: Stable enlarged cardiac silhouette and transcatheter aortic valve. The right PICC is unchanged. A right chest tube remains in place with no pneumothorax. The pulmonary vasculature and interstitial markings remain prominent. Thoracic spine degenerative changes.  IMPRESSION: Stable cardiomegaly and changes of congestive heart failure. Electronically Signed   By: Beckie Salts M.D.   On: 09/10/2016 08:14    STUDIES:  PFT 08/31/16:  FVC 1.16 L (40%) FEV1 1.26 L (40%) FEV1/FVC 0.75 negative bronchodilator response TTE 09/07/16 (post TAVR):  EF 50-55%. Bioprosthetic aortic valve present. RV normal in size and function. Small pericardial effusion.  MICROBIOLOGY: MRSA PCR 10/13: Negative Pericardial Effusion 10/20:  Negative MRSA PCR 10/23:  Negative Blood Ctx x2 10/27 >>  ANTIBIOTICS: Cefuroxime 10/24 for Surgical PPx Vancomycin 10/27 >> Zosyn 10/27 >>  SIGNIFICANT EVENTS: 10/12 - Admit 10/20 - Pericardiocentesis:  775cc fluid removed. Cytology & Malignancy neg. 10/24 - Extubated post TAVR 10/26 - Chest tube reinserted by CVTS  LINES/TUBES: R Chest Tube 10/26 >> R DL PICC 81/19 >> L Radial Art Line 10/27 >> PIV x1  DISCUSSION:  65 y.o. male s/p TAVR with subsequent right hemothorax status post right-sided chest tube replacement. The patient does seem to have sepsis as a likely source for his hypotension given his repeat echocardiogram findings with improved ejection fraction. Agree with empiric coverage with vancomycin and Zosyn for now while awaiting for culture results. Defer management of right hemothorax and chest tube to thoracic surgery.  ASSESSMENT / PLAN:  CARDIOVASCULAR A:  Shock - Likely septic ( PCT down now)>resolved off pressors 10/28  Severe Aortic Stenosis - S/P TAVR 10/24. Pericardial Effusion - S/P Pericardiocentesis 10/20 in prep for TAVR. Still with small residual effusion on TTE 10/26. Atrial Appendage Thrombus H/O HTN H/O Persistent Atrial Fibrillation Probable component rel AI  P:  Primacor & Amiodarone drips per cardiology  Lipitor qhs ASA 81mg  daily Cortisol 22.7, empiric stress roids 10/28   INFECTIOUS A:   Sepsis-PCT tr down 4>2> 1.88  Possible HCAP  P:   Empiric Vancomycin & Zosyn Day #3 Awaiting  culture results Trending Procalcitonin per Algorithm - encouraging  likley dc vanc in am if no staph  PULMONARY A: Acute Hypoxic Respiratory Failure Right Hemothorax H/O Chronic Bronchitis  P:   Weaning FiO2 for Sat >92% Continuous Pulse Oximetry  Chest Tube Management per CVTS Repeat CXR in am   RENAL A:   Oliguric Acute Renal Failure improved, ATN likely Hyponatremia - Mild. Metabolic Acidosis - Mild. 10/28 Urine na 37, osmolarity 363  P:   [place] neg Lasix per cards   GASTROINTESTINAL A:   H/O Liver Cirrhosis - New Diagnosis. H/O GERD  P:   Heart Health Diet Protonix PO daily Miralax PO daily lactulose  HEMATOLOGIC A:   Right Hemothorax Anemia - Mild. S/P Transfusion Leukocytosis - Likely due to sepsis. Drop plat, consumption  P:  Trending cell counts daily w/ CBC SCDs  ENDOCRINE A:   H/O Thyroid Nodules Hyperglycemia - No h/o DM. Rel AI likely P:   Checking  TSH 2.3, T4 reviewed,  T3 low 1.6  Stress roids for now Monitoring glucose on daily labs  NEUROLOGIC A:   Post Procedure Pain  P:   Oxycodone IR PO prn Percocet PO prn Morphine IV prn Tylenol prn Trazodone qhs prn  FAMILY  - Updates:  To pt by me    Tammy Parrett NP-C  [facility]  [phone]    Attending Note:  65 year old male with CHF presenting for a TAVR with subsequent right sided HTX s/p CT placement.  Patient became septic and hypotensive, started on levophed and vasopressin on top of primacor and amiodarone.  Abx were started.  Patient states he feels much better.  Levophed off but primacor and amiodarone are continued at 0.25 and 30 respectively.  On exam, lungs are clear, patient is sitting up in a chair conversant.  I reviewed CXR myself, HTX resolved.  Discussed with PCCM-NP and bedside RN.  Septic shock:  - D/C levophed.  - D/C vasopressin  - Continue abx.  Cardiogenic shock:  - Continue primacor.  HCAP: no staph  - D/C vanc  - Continue  zosyn until cultures are final, if no growth would finish an 8 day course of total abx.  Oliguria: stable BP  - Lasix.  - BMET in AM  - Replace electrolytes as indicated.  Hypoxemia:  - Titrate for SBP of 88-92%.  - Will need an ambulatory desaturation study prior to discharge to determine home O2 need.  PCCM will sign off, please call back if needed.  Patient seen and examined, agree with above note.  I dictated the care and orders written for this patient under my direction.  Alyson ReedyWesam G Yasmyn Bellisario, MD [phone].

## 2016-09-10 NOTE — Progress Notes (Addendum)
ANTICOAGULATION CONSULT NOTE - Initial Consult  Pharmacy Consult for warfarin Indication: atrial fibrillation  Patient Measurements: Height: 5\' 8"  (172.7 cm) Weight: 187 lb 9.8 oz (85.1 kg) IBW/kg (Calculated) : 68.4  Vital Signs: Temp: 97.7 F (36.5 C) (10/29 0404) Temp Source: Oral (10/29 0404) BP: 80/53 (10/28 2000) Pulse Rate: 86 (10/29 0700)  Labs:  Recent Labs  09/08/16 0230 09/09/16 0420 09/10/16 0400  HGB 11.2* 8.7* 8.3*  HCT 34.5* 26.2* 24.8*  PLT 186 109* 92*  LABPROT 21.9* 20.8* 18.4*  INR 1.88 1.77 1.52  CREATININE 3.71* 3.22* 2.94*    Estimated Creatinine Clearance: 25.9 mL/min (by C-G formula based on SCr of 2.94 mg/dL (H)).  Assessment: 65 yo M with h/o atrial fibrillation, persistent with RVR this admission, on PTA apixaban (last dose 10/13). Pt initially started on heparin gtt; however, heparin now held with bleeding into his right chest. Since pt had LAA thrombus while on apixaban, pharmacy has been consulted to start warfarin without heparin as chest tube still in and draining.   Pt received warfarin 7.5 mg x1 on 10/25. INR trending down to 1.52, CBC low stable  Goal of Therapy:  INR 2.5 - 3.0 per cards   Plan:  Warfarin 7.5 mg tonight Monitor INR daily, CBC, s/sx worsening bleed    Mackie Pai, PharmD PGY1 Pharmacy Resident Pager: [phone] 09/10/2016 7:53 AM

## 2016-09-11 ENCOUNTER — Inpatient Hospital Stay (HOSPITAL_COMMUNITY): Payer: Commercial Managed Care - HMO

## 2016-09-11 ENCOUNTER — Encounter (HOSPITAL_COMMUNITY): Payer: Self-pay | Admitting: *Deleted

## 2016-09-11 LAB — CBC WITH DIFFERENTIAL/PLATELET
BASOS ABS: 0 10*3/uL (ref 0.0–0.1)
BASOS PCT: 0 %
EOS ABS: 0 10*3/uL (ref 0.0–0.7)
EOS PCT: 0 %
HCT: 24.2 % — ABNORMAL LOW (ref 39.0–52.0)
Hemoglobin: 8.1 g/dL — ABNORMAL LOW (ref 13.0–17.0)
LYMPHS PCT: 2 %
Lymphs Abs: 0.2 10*3/uL — ABNORMAL LOW (ref 0.7–4.0)
MCH: 26.7 pg (ref 26.0–34.0)
MCHC: 33.5 g/dL (ref 30.0–36.0)
MCV: 79.9 fL (ref 78.0–100.0)
MONO ABS: 1.2 10*3/uL — AB (ref 0.1–1.0)
Monocytes Relative: 9 %
Neutro Abs: 11.5 10*3/uL — ABNORMAL HIGH (ref 1.7–7.7)
Neutrophils Relative %: 89 %
PLATELETS: 109 10*3/uL — AB (ref 150–400)
RBC: 3.03 MIL/uL — AB (ref 4.22–5.81)
RDW: 17.9 % — AB (ref 11.5–15.5)
WBC: 12.9 10*3/uL — ABNORMAL HIGH (ref 4.0–10.5)

## 2016-09-11 LAB — RENAL FUNCTION PANEL
ALBUMIN: 2 g/dL — AB (ref 3.5–5.0)
Anion gap: 10 (ref 5–15)
BUN: 43 mg/dL — AB (ref 6–20)
CALCIUM: 8.1 mg/dL — AB (ref 8.9–10.3)
CO2: 22 mmol/L (ref 22–32)
CREATININE: 2.76 mg/dL — AB (ref 0.61–1.24)
Chloride: 94 mmol/L — ABNORMAL LOW (ref 101–111)
GFR calc Af Amer: 26 mL/min — ABNORMAL LOW (ref >60)
GFR, EST NON AFRICAN AMERICAN: 22 mL/min — AB (ref >60)
Glucose, Bld: 133 mg/dL — ABNORMAL HIGH (ref 65–99)
PHOSPHORUS: 4 mg/dL (ref 2.5–4.6)
POTASSIUM: 4.2 mmol/L (ref 3.5–5.1)
SODIUM: 126 mmol/L — AB (ref 135–145)

## 2016-09-11 LAB — PROTIME-INR
INR: 1.31
PROTHROMBIN TIME: 16.3 s — AB (ref 11.4–15.2)

## 2016-09-11 LAB — GLUCOSE, CAPILLARY: GLUCOSE-CAPILLARY: 152 mg/dL — AB (ref 65–99)

## 2016-09-11 LAB — COOXEMETRY PANEL
Carboxyhemoglobin: 2 % — ABNORMAL HIGH (ref 0.5–1.5)
Methemoglobin: 0.7 % (ref 0.0–1.5)
O2 Saturation: 65.5 %
TOTAL HEMOGLOBIN: 6.9 g/dL — AB (ref 12.0–16.0)

## 2016-09-11 MED ORDER — WARFARIN SODIUM 7.5 MG PO TABS
7.5000 mg | ORAL_TABLET | Freq: Once | ORAL | Status: AC
  Administered 2016-09-11: 7.5 mg via ORAL
  Filled 2016-09-11: qty 1

## 2016-09-11 MED ORDER — MILRINONE LACTATE IN DEXTROSE 20-5 MG/100ML-% IV SOLN
0.1250 ug/kg/min | INTRAVENOUS | Status: DC
  Administered 2016-09-11 – 2016-09-12 (×2): 0.125 ug/kg/min via INTRAVENOUS
  Filled 2016-09-11: qty 100

## 2016-09-11 MED ORDER — AMIODARONE HCL 200 MG PO TABS
200.0000 mg | ORAL_TABLET | Freq: Two times a day (BID) | ORAL | Status: DC
  Administered 2016-09-11 – 2016-09-16 (×11): 200 mg via ORAL
  Filled 2016-09-11 (×11): qty 1

## 2016-09-11 MED ORDER — HYDROCORTISONE NA SUCCINATE PF 100 MG IJ SOLR
50.0000 mg | Freq: Two times a day (BID) | INTRAMUSCULAR | Status: DC
  Administered 2016-09-11: 50 mg via INTRAVENOUS
  Filled 2016-09-11 (×2): qty 2

## 2016-09-11 NOTE — Progress Notes (Signed)
CRITICAL VALUE ALERT  Critical value received: Hg = 6.9  Date of notification:  t  Time of notification:  0400  Critical value read back:Yes.    Nurse who received alert:  Almedia Balls, RN  MD notified (1st page): Dr Tyrone Sage  Time of first page: during rounds  MD notified (2nd page):  Time of second page:  Responding MD:  Dr Tyrone Sage  Time MD responded:  CBC sent for verification, above call range of 6.0

## 2016-09-11 NOTE — Progress Notes (Signed)
6 Days Post-Op Procedure(s) (LRB): TRANSCATHETER AORTIC VALVE REPLACEMENT, TRANSFEMORAL (N/A) TRANSESOPHAGEAL ECHOCARDIOGRAM (TEE) (N/A) CHEST TUBE INSERTION (Right) Subjective:  No complaints, says he feels well, walked around the ICU this am without any difficulty and felt like he could go 2 laps at least.  Remains on Milrinone 0.25 with Co-ox 65.5.  Objective: Vital signs in last 24 hours: Temp:  [97.5 F (36.4 C)-97.8 F (36.6 C)] 97.6 F (36.4 C) (10/29 1954) Pulse Rate:  [42-115] 80 (10/30 0600) Cardiac Rhythm: Atrial fibrillation (10/29 2000) Resp:  [0-50] 13 (10/30 0600) SpO2:  [93 %-100 %] 100 % (10/30 0600) Weight:  [85.6 kg (188 lb 11.4 oz)] 85.6 kg (188 lb 11.4 oz) (10/30 0600)  Hemodynamic parameters for last 24 hours: CVP:  [12 mmHg-17 mmHg] 15 mmHg  Intake/Output from previous day: 10/29 0701 - 10/30 0700 In: 1890.5 [P.O.:870; I.V.:670.5; IV Piggyback:350] Out: 3715 [Urine:3125; Chest Tube:590] Intake/Output this shift: No intake/output data recorded.  General appearance: alert and cooperative Neurologic: intact Heart: irregularly irregular rhythm Lungs: diminished breath sounds right base Extremities: extremities normal, atraumatic, no cyanosis or edema Wound: groin access sites ok  Lab Results:  Recent Labs  09/10/16 0400 09/11/16 0350  WBC 10.6* 12.9*  HGB 8.3* 8.1*  HCT 24.8* 24.2*  PLT 92* 109*   BMET:  Recent Labs  09/10/16 0400 09/11/16 0350  NA 124* 126*  K 4.7 4.2  CL 93* 94*  CO2 23 22  GLUCOSE 129* 133*  BUN 39* 43*  CREATININE 2.94* 2.76*  CALCIUM 8.2* 8.1*    PT/INR:  Recent Labs  09/11/16 0350  LABPROT 16.3*  INR 1.31   ABG    Component Value Date/Time   PHART 7.420 09/05/2016 1350   HCO3 28.4 (H) 09/05/2016 1350   TCO2 30 09/05/2016 1350   O2SAT 65.5 09/11/2016 0350   CBG (last 3)   Recent Labs  09/08/16 1155 09/08/16 2004 09/08/16 2346  GLUCAP 124* 189* 135*    Assessment/Plan: S/P Procedure(s)  (LRB): TRANSCATHETER AORTIC VALVE REPLACEMENT, TRANSFEMORAL (N/A) TRANSESOPHAGEAL ECHOCARDIOGRAM (TEE) (N/A) CHEST TUBE INSERTION (Right)  He is hemodynamically stable on Milrinone 0.25 with good Co-ox. He appeared to develop postop sepsis with shock although all cultures negative.  Spontaneous hemothorax after starting on anticoagulation. Chest tube output still serosanguinous and too high to remove yet. Coumadin started last pm. INR has not bumped yet. Keep chest tube in to suction for now. Acute renal failure secondary to hypotension improving with good urine output. Acute postop blood loss anemia: Hgb trickling down probably related to blood draws, equilibration. He could still be losing a little from his right pleural space. Probably needs some iron.  General plans per heart failure team. He can go to 2W stepdown when ok with them. Continue chest tube to suction and monitor output. CXR daily    LOS: 18 days    [name] 09/11/2016

## 2016-09-11 NOTE — Progress Notes (Signed)
Pharmacy Antibiotic Note  Lucas Munoz is a 65 y.o. male admitted 10/12 with severe AS and heart failure, s/p TAVR 10/24 now w/CXR showing possible infiltrates. Initially started on vancomycin and Zosyn for possible septic shock and right sided pneumonia, vancomycin was discontinued yesterday but patient continues on Zosyn. Renal function is improving.  Plan: 1) Zosyn 3.375g IV q8 (4 hour infusion) 3) Follow renal function, cultures, LOT  Height: 5\' 8"  (172.7 cm) Weight: 188 lb 11.4 oz (85.6 kg) IBW/kg (Calculated) : 68.4  Temp (24hrs), Avg:97.8 F (36.6 C), Min:97.6 F (36.4 C), Max:98.1 F (36.7 C)   Recent Labs Lab 09/07/16 1537 09/08/16 0230 09/09/16 0420 09/10/16 0400 09/11/16 0350  WBC 21.0* 20.9* 14.3* 10.6* 12.9*  CREATININE 3.09* 3.71* 3.22* 2.94* 2.76*    Estimated Creatinine Clearance: 27.7 mL/min (by C-G formula based on SCr of 2.76 mg/dL (H)).    Allergies  Allergen Reactions  . Potassium-Containing Compounds Other (See Comments)    Upset stomach    Antimicrobials this admission: 10/27 Vancomycin >> 10/29 10/27 Zosyn >>  Dose adjustments this admission: n/a  Microbiology results: 10/20 pericardial fluid >> negative final 10/27 blood x2>> MRSA PCR negative  Thank you for allowing pharmacy to be a part of this patient's care.  Sherron Monday 09/11/2016 9:10 AM

## 2016-09-11 NOTE — Progress Notes (Signed)
ANTICOAGULATION CONSULT NOTE - Follow Up Consult  Pharmacy Consult for warfarin Indication: atrial fibrillation and atrial thrombus  Allergies  Allergen Reactions  . Potassium-Containing Compounds Other (See Comments)    Upset stomach    Patient Measurements: Height: 5\' 8"  (172.7 cm) Weight: 188 lb 11.4 oz (85.6 kg) IBW/kg (Calculated) : 68.4  Vital Signs: Pulse Rate: 80 (10/30 0600)  Labs:  Recent Labs  09/09/16 0420 09/10/16 0400 09/11/16 0350  HGB 8.7* 8.3* 8.1*  HCT 26.2* 24.8* 24.2*  PLT 109* 92* 109*  LABPROT 20.8* 18.4* 16.3*  INR 1.77 1.52 1.31  CREATININE 3.22* 2.94* 2.76*    Estimated Creatinine Clearance: 27.7 mL/min (by C-G formula based on SCr of 2.76 mg/dL (H)).  Assessment: 65yom on eliquis pta for afib, transitioned to heparin for pericardiocentesis on 10/20 with drain placement. Drain removed 10/24 w/high risk TAVR same day. Patient initially started on heparin bridge to warfarin post procedure for history of afib and developing atrial thrombus that was seen on TEE, however anticoagulation held with bleeding into his right chest. Warfarin restarted without heparin yesterday as chest tube still in and draining. INR continues to trend down, hemoglobin also continues to fall.  Goal of Therapy:  Heparin level 0.3-0.7 units/ml  INR 2.5-3 Monitor platelets by anticoagulation protocol: Yes   Plan:  1) Warfarin 7.5mg *1 tonight 2) Daily INR, CBC 3) Warfarin education when out of ICU  Sherron Monday, PharmD Clinical Pharmacy Resident Pager: [phone] 09/11/16 8:20 AM

## 2016-09-11 NOTE — Progress Notes (Signed)
Patient ID: Lucas Munoz, male   DOB: 03-10-1951, 65 y.o.   MRN: [id number]  SICU Evening Rounds:  Hemodynamically stable on milrinone 0.125.  Urine output good  No problems today.

## 2016-09-11 NOTE — Progress Notes (Signed)
Patient ID: Lucas Munoz, male   DOB: [birth year], 65 y.o.   MRN: [id number]    SUBJECTIVE:   PICC placed , initial co-ox on 10/14 47%.  He was started on milrinone. Milrinone increased to 0.375 mcg 08/29/16 for low mixed venous sat on RHC (CI 1.9).  Underwent pericardiocentesis on 10/20 with 775cc out. Fluid transudative. Drained another 225 cc yesterday  TEE 10/23 confirmed amorphous material in LA appendage, appears to be forming thrombus.  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   Post-TAVR echo 10/25 with EF up to 45%, moderate LVH, bioprosthetic AVR with mean gradient 20 (higher than expected), PASP 37, normal RV, no pericardial effusion.   On 10/26 developed nausea/vomiting and hypotension.  He was started on norepinephrine and IV fluid with low CVP. CXR showed large right pleural effusion accumulation. Chest tube replaced on right, 1300 cc bloody fluid out.  Heparin stopped. He remained hypotensive.  1 unit PRBCs given. Also noted rise in creatinine with poor UOP.  Echo 10/26 with EF about 45%, small pericardial effusion with no tamponade.   PCT up to 4.63, possible HCAP.  Suspect septic shock.   Doing better today.  Off pressors now.  Creatinine 3.71 => 3.22 => 2.94=>2.76 .  CVP down to 9. Today CO-OX is 65%.     Denies SOB. Feels good.    RHC/LHC (08/29/16) Left Main  Mild ostial narrowing.  Left Anterior Descending  30% proximal LAD stenosis. 60% distal LAD stenosis. Moderate D1 without significant disease. Small to moderate D2 with long 70% ostial stenosis.  Left Circumflex  50% proximal LCx stenosis at OM1. Moderate OM1 with 30% ostial stenosis.  Right Coronary Artery  40% proximal and 40% mid RCA stenosis.   RA mean 19 RV 65/15 PA 69/34, mean 51 PCWP mean 43 (not sure we got an accurate PCWP).  AO 106/87 Oxygen saturations: PA 46% AO 92% Cardiac Output (Fick) 3.8  Cardiac Index (Fick) 1.9  Echo (10/14): EF 15%, severe AS,  moderately decreased RV systolic function, PASP 65 mmHg, moderate to large pericardial effusion without tamponade.   Scheduled Meds: . sodium chloride   Intravenous Once  . sodium chloride   Intravenous Once  . acetaminophen (TYLENOL) oral liquid 160 mg/5 mL  650 mg Per Tube Once   Or  . acetaminophen  650 mg Rectal Once  . aspirin  81 mg Oral Daily  . atorvastatin  40 mg Oral q1800  . furosemide  40 mg Intravenous BID  . hydrocortisone sodium succinate  50 mg Intravenous Q6H  . mouth rinse  15 mL Mouth Rinse BID  . pantoprazole  40 mg Oral Daily  . piperacillin-tazobactam (ZOSYN)  IV  3.375 g Intravenous Q8H  . polyethylene glycol  17 g Oral Daily  . sodium chloride flush  10-40 mL Intracatheter Q12H  . sodium chloride flush  3 mL Intravenous Q12H  . Warfarin - Pharmacist Dosing Inpatient   Does not apply q1800   Continuous Infusions: . amiodarone 30 mg/hr (09/11/16 0829)  . milrinone 0.25 mcg/kg/min (09/11/16 0600)   PRN Meds:.sodium chloride, Place/Maintain arterial line **AND** sodium chloride, acetaminophen, alum & mag hydroxide-simeth, docusate sodium, lactulose, morphine injection, ondansetron (ZOFRAN) IV, ondansetron (ZOFRAN) IV, oxyCODONE, oxyCODONE-acetaminophen, sodium chloride flush, sodium chloride flush, traMADol, traZODone, white petrolatum   Vitals:   09/11/16 0500 09/11/16 0600 09/11/16 0800 09/11/16 0820  BP:      Pulse: (!) 105 80 87   Resp: 20 13 15  Temp:    98.1 F (36.7 C)  TempSrc:    Oral  SpO2: 100% 100% 93%   Weight:  188 lb 11.4 oz (85.6 kg)    Height:        Intake/Output Summary (Last 24 hours) at 09/11/16 0849 Last data filed at 09/11/16 0841  Gross per 24 hour  Intake             1360 ml  Output             3515 ml  Net            -2155 ml    LABS: Basic Metabolic Panel:  Recent Labs  16/10/96 0420 09/10/16 0400 09/11/16 0350  NA 124* 124* 126*  K 4.9 4.7 4.2  CL 94* 93* 94*  CO2 23 23 22   GLUCOSE 131* 129* 133*  BUN 33*  39* 43*  CREATININE 3.22* 2.94* 2.76*  CALCIUM 7.8* 8.2* 8.1*  MG 2.7*  --   --   PHOS 4.2 4.3 4.0   Liver Function Tests:  Recent Labs  09/09/16 0420 09/11/16 0350  ALBUMIN 2.1* 2.0*   No results for input(s): LIPASE, AMYLASE in the last 72 hours. CBC:  Recent Labs  09/10/16 0400 09/11/16 0350  WBC 10.6* 12.9*  NEUTROABS 9.8* 11.5*  HGB 8.3* 8.1*  HCT 24.8* 24.2*  MCV 80.0 79.9  PLT 92* 109*   Cardiac Enzymes: No results for input(s): CKTOTAL, CKMB, CKMBINDEX, TROPONINI in the last 72 hours. BNP: Invalid input(s): POCBNP D-Dimer: No results for input(s): DDIMER in the last 72 hours. Hemoglobin A1C:  Recent Labs  09/08/16 1507  HGBA1C 6.0*   Fasting Lipid Panel: No results for input(s): CHOL, HDL, LDLCALC, TRIG, CHOLHDL, LDLDIRECT in the last 72 hours. Thyroid Function Tests:  Recent Labs  09/08/16 1507 09/09/16 1612  TSH 2.367  --   T3FREE  --  1.6*   Anemia Panel:  Recent Labs  09/10/16 1030  FERRITIN 476*  TIBC 353  IRON 67    RADIOLOGY: US Renal  Result Date: 09/08/2016 CLINICAL DATA:  Acute renal failure EXAM: RENAL / URINARY TRACT ULTRASOUND COMPLETE COMPARISON:  CT abdomen pelvis 04/11/2016 FINDINGS: Right Kidney: Length: 10.8 cm. Negative for obstruction or mass. Limited evaluation the right kidney due to chest tube and bandages. Left Kidney: Length: 12.2 cm. Echogenicity within normal limits. No mass or hydronephrosis visualized. Bladder: Appears normal for degree of bladder distention. IMPRESSION: Negative renal ultrasound. Electronically Signed   By: Marlan Palau M.D.   On: 09/08/2016 16:09   Ct Cardiac Morph/pulm Vein W/cm&w/o Ca Score  Addendum Date: 09/01/2016   ADDENDUM REPORT: 09/01/2016 17:18 CLINICAL DATA:  Aortic stenosis EXAM: Cardiac TAVR CT TECHNIQUE: The patient was scanned on a Philips 256 scanner. A 120 kV retrospective scan was triggered in the descending thoracic aorta at 111 HU's. Gantry rotation speed was 270 msecs  and collimation was .9 mm. No beta blockade or nitro were given. The 3D data set was reconstructed in 5% intervals of the R-R cycle. Systolic and diastolic phases were analyzed on a dedicated work station using MPR, MIP and VRT modes. The patient received 80 cc of contrast. 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 Heavily calcified Aorta:  Aortic root dilated Sinotubular Junction:  32 mm Ascending Thoracic Aorta:  43 mm Aortic Arch:  30 mm Descending Thoracic Aorta:  26 mm Sinus of Valsalva Measurements: Non-coronary:  33 mm Right -coronary:  31 mm Left -  coronary:  32 mm Coronary Artery Height above Annulus: Left Main:  19 mm above annulus Right Coronary:  10 mm above annulus Virtual Basal Annulus Measurements: Maximum/Minimum Diameter:  27.1 x 20.3 mm Perimeter:  78 mm Area:  442 mm2 Coronary Arteries:  Sufficient height above annulus for deployment Optimum Fluoroscopic Angle for Delivery:  LAO 29 degrees IMPRESSION: 1) functionally bicuspid AV heavily calcified with annulus suitable for 26 mm Sapien 3 valve 2) Moderate aortic root enlargement 4.3 cm 3) Large pericardial effusion patient to have pericardiocentesis post CT 4) Cannot r/o LAA thrombus suggest TEE correlation 5) Optimum angiographic angle for deployment LAO 29 degrees 6) Coronary heights sufficient for deployment Charlton Haws Electronically Signed   By: Charlton Haws M.D.   On: 09/01/2016 17:18   Result Date: 09/01/2016 EXAM: OVER-READ INTERPRETATION  CT CHEST The following report is an over-read performed by radiologist Dr. Royal Piedra [facility] on 09/01/2016. This over-read does not include interpretation of cardiac or coronary anatomy or pathology. The coronary calcium score/coronary CTA interpretation by the cardiologist is attached. COMPARISON:  Chest CT 05/01/2016. FINDINGS: Large pericardial effusion. No pericardial calcification. Aortic atherosclerosis with aneurysmal dilatation of  the ascending thoracic aorta which measures up to 4.8 cm in diameter (unchanged). Large right-sided pleural effusion layering dependently. Extensive passive atelectasis in the right lower lobe. Tiny calcified granuloma in the periphery of the right lower lobe. 4 mm subpleural noncalcified pulmonary nodule in the left lower lobe abutting the major fissure (image 71 of series 512), unchanged compared to prior study 05/01/2016, favored to represent a subpleural lymph node. No other suspicious appearing pulmonary nodules or masses are noted in the visualized portions of the thorax. Partially calcified pleural plaque in the right hemithorax (image 31 of series 513) again noted. No calcified pleural plaques in the left hemithorax. Visualized portions of the upper abdomen demonstrate a nodular contour of the liver, suggestive of underlying cirrhosis. No aggressive appearing lytic or blastic lesions are noted in the visualized portions of the skeleton. Right upper extremity PICC with tip terminating in the distal superior vena cava. IMPRESSION: 1. Interval increased size of what is now a large pericardial effusion. No associated pericardial calcification. 2. Enlargement of a large layering right-sided pleural effusion. This is associated with extensive passive atelectasis in the right lower lobe. 3. Aortic atherosclerosis, with similar aneurysmal dilatation of the ascending thoracic aorta which measures up to 4.8 cm in diameter. 4. Morphologic changes in the liver indicative of cirrhosis, as above. 5. 4 mm subpleural nodule in the periphery of the left lower lobe is nonspecific, but statistically likely a subpleural lymph node. No follow-up needed if patient is low-risk. Non-contrast chest CT can be considered in 12 months if patient is high-risk. This recommendation follows the consensus statement: Guidelines for Management of Incidental Pulmonary Nodules Detected on CT Images: From the Fleischner Society 2017; Radiology  2017; 284:228-243. 6. Additional incidental findings, as above. Electronically Signed: By: Trudie Reed M.D. On: 09/01/2016 14:38   Dg Chest Port 1 View  Result Date: 09/11/2016 CLINICAL DATA:  Respiratory failure, shortness of breath, status post aortic valve replacement on October24, 2017. EXAM: PORTABLE CHEST 1 VIEW COMPARISON:  Portable chest x-rays of September 05, 2016 and September 10, 2016. FINDINGS: The left lung is well-expanded and clear. On the right no pneumothorax is evident. There is a small right pleural effusion. The interstitial markings remain prominent especially at the lung base. The right-sided chest tube tip projects over the inferior margin  of the posterior aspect of the right sixth rib. The cardiac silhouette remains enlarged. The pulmonary vascularity is less engorged. There is calcification in the wall of the aortic arch. There degenerative changes of the shoulders and there is an old healed midshaft right clavicular fracture. The right-sided PICC line tip projects over the midportion of the SVC. IMPRESSION: Mild improvement in the pulmonary interstitium overall consistent with decreasing interstitial edema. Persistent small right pleural effusion. The right-sided chest tube is in stable position. Electronically Signed   By: David  [place] M.D.   On: 09/11/2016 07:40   Dg Chest Port 1 View  Result Date: 09/10/2016 CLINICAL DATA:  Chest congestion.  Cough. EXAM: PORTABLE CHEST 1 VIEW COMPARISON:  Yesterday. FINDINGS: Stable enlarged cardiac silhouette and transcatheter aortic valve. The right PICC is unchanged. A right chest tube remains in place with no pneumothorax. The pulmonary vasculature and interstitial markings remain prominent. Thoracic spine degenerative changes. IMPRESSION: Stable cardiomegaly and changes of congestive heart failure. Electronically Signed   By: Beckie Salts M.D.   On: 09/10/2016 08:14   Dg Chest Port 1 View  Result Date: 09/09/2016 CLINICAL DATA:   Status post TAVR and right chest tube placement for hemothorax. EXAM: PORTABLE CHEST 1 VIEW COMPARISON:  09/08/2016 FINDINGS: Right chest tube remains in place with no significant pleural fluid volume. No pneumothorax. There remains atelectasis in the right lower lung. PICC line positioning stable in the distal SVC. Stable cardiac enlargement and appearance of transcatheter aortic valve. Suspect mild residual interstitial edema which appears stable. IMPRESSION: No significant residual right pleural fluid volume with atelectasis remaining in the right lower lung. Stable mild interstitial edema. Electronically Signed   By: Irish Lack M.D.   On: 09/09/2016 09:13   Dg Chest Port 1 View  Result Date: 09/08/2016 CLINICAL DATA:  Shortness of breath.  CHF. EXAM: PORTABLE CHEST 1 VIEW COMPARISON:  09/07/2016. FINDINGS: Right PICC line and right chest tube in stable position. No pneumothorax. Right pleural effusion is improved slightly. Cardiomegaly with diffuse pulmonary interstitial prominence mild congestive heart failure cannot be excluded . IMPRESSION: 1. Right PICC line right chest tube in stable position. No pneumothorax. Right pleural effusion has improved slightly. 2. With bilateral from interstitial prominence. Mild congestive heart failure cannot be excluded . Electronically Signed   By: Maisie Fus  Register   On: 09/08/2016 08:48   Dg Chest Port 1 View  Result Date: 09/07/2016 CLINICAL DATA:  Hemothorax with new right chest tube. EXAM: PORTABLE CHEST 1 VIEW COMPARISON:  Earlier today FINDINGS: Decreased but still extensive right hemothorax after chest tube placement. No visualized gas component. Right upper extremity PICC with tip at the SVC level. Unchanged cardiopericardial enlargement. Status post transcatheter aortic valve replacement. IMPRESSION: 1. Right chest tube placement with diminished hemothorax. 2. Elsewhere stable chest. Electronically Signed   By: Marnee Spring M.D.   On: 09/07/2016  09:05   Dg Chest Port 1 View  Result Date: 09/07/2016 CLINICAL DATA:  Vomiting EXAM: PORTABLE CHEST 1 VIEW COMPARISON:  Yesterday FINDINGS: Stable right upper extremity PICC. Swan-Ganz catheter and right jugular introducer have been removed. Right chest tube is been removed. A large right pleural effusion has accumulated. There is compressive atelectasis within much of the right lung. Vascular congestion. Left lung is otherwise clear. Cardiomegaly. Small right pneumothorax is suspected. IMPRESSION: Right chest tube removed. Large right pleural effusion has reaccumulated. Very small right apical pneumothorax has also developed. Electronically Signed   By: Jolaine Click M.D.   On:  09/07/2016 07:39   Dg Chest Port 1 View  Result Date: 09/06/2016 CLINICAL DATA:  Cardiac surgery EXAM: PORTABLE CHEST 1 VIEW COMPARISON:  Yesterday FINDINGS: Right upper extremity PICC, right chest tube, right jugular introducer, right Swan-Ganz catheter are stable. There is no pneumothorax. Small right pleural effusion is stable. Vascular congestion and mild edema are worse. Lower lung volumes. Cardiac silhouette remains markedly enlarged. IMPRESSION: Worsening vascular congestion and edema. No pneumothorax. Electronically Signed   By: Jolaine Click M.D.   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 Postoperative image. EXAM: PORTABLE CHEST 1 VIEW COMPARISON:  CT chest 09/01/2016 and single view of the chest 08/26/2016. FINDINGS: Endotracheal tube is in place with tip in good position at the level of the clavicular heads. Right IJ approach Swan-Ganz catheter tip is in the proximal right main pulmonary artery. Tip of right PICC projects in the lower superior vena cava. NG tube courses into the stomach. Right chest tube is identified. There is a small right pleural effusion and basilar atelectasis, improved since the prior chest film. The lungs  are otherwise clear. The cardiopericardial silhouette is enlarged. No pneumothorax. IMPRESSION: Support tubes and lines projecting good position. Negative for pneumothorax. Decreased small right pleural effusion and basilar atelectasis. Enlarged cardiopericardial silhouette consistent with cardiomegaly and pericardial effusion as seen on CT scan. Electronically Signed   By: Drusilla Kanner M.D.   On: 09/05/2016 13:56   Dg Chest Port 1 View  Result Date: 08/26/2016 CLINICAL DATA:  65 year old male with a history of shortness of breath EXAM: PORTABLE CHEST 1 VIEW COMPARISON:  08/25/2016, CT chest 05/01/2016 FINDINGS: Compare to the prior plain film there is unchanged size of the cardiac silhouette. Calcifications of the aortic arch. Interval placement of right upper extremity PICC with the tip appearing to terminate at the superior cavoatrial junction. Increasing opacity at the right base with thickening of the minor fissure, obscuration the right hemidiaphragm and right heart border. Interlobular septal thickening. IMPRESSION: Evidence of CHF with increasing right-sided pleural effusion/atelectasis. Unchanged cardiac silhouette in this patient with a history of pericardial effusion imaged on prior CT 05/01/2016. Interval placement of right upper extremity PICC. Aortic atherosclerosis. Signed, Yvone Neu. Loreta Ave, DO [facility] Electronically Signed   By: Gilmer Mor D.O.   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 Dilated cardiomyopathy. Acute kidney injury. EXAM: PORTABLE CHEST 1 VIEW COMPARISON:  None. FINDINGS: Marked cardiac enlargement noted. Diffuse interstitial infiltrates consistent with mild interstitial edema. Small right pleural effusion also seen with right basilar atelectasis. IMPRESSION: Mild congestive heart failure, with small right pleural  effusion and right basilar atelectasis. Electronically Signed   By: Myles Rosenthal M.D.   On: 08/25/2016 15:14   Ct Angio Chest/abd/pel For Dissection W And/or W/wo  Result Date: 09/03/2016 CLINICAL DATA:  65 year old male with history of severe aortic stenosis. Preprocedural study prior to potential transcatheter aortic valve replacement. Recent history of pericardiocentesis. EXAM: CT ANGIOGRAPHY CHEST, ABDOMEN AND PELVIS TECHNIQUE: Multidetector CT imaging through the chest, abdomen and pelvis was performed using the standard protocol during bolus administration of intravenous contrast. Multiplanar reconstructed images and MIPs were obtained and reviewed to evaluate the vascular anatomy. CONTRAST:  80 mL of Isovue 370. COMPARISON:  Cardiac gated CTA 09/01/2016. FINDINGS: CTA CHEST FINDINGS Cardiovascular: Heart  size is mildly enlarged. Small amount of residual pericardial fluid and/or thickening, decreased compared to the prior study. Pericardiocentesis catheter in place with tip terminating high in a superior pericardial recess adjacent to the aortic arch. No pericardial calcification. Severe thickening calcification of the aortic valve, which has an appearance suggestive of a bicuspid valve. There is aortic atherosclerosis, as well as atherosclerosis of the great vessels of the mediastinum and the coronary arteries, including calcified atherosclerotic plaque in the left anterior descending and right coronary arteries. 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 Large filling defect within the left atrial appendage, concerning for potential left atrial appendage thrombus. Right upper extremity PICC with tip terminating at the superior cavoatrial junction. Mediastinum/Lymph Nodes: Several borderline enlarged and minimally enlarged mediastinal lymph nodes measuring up to 14 mm in short axis, slightly increased compared to the prior study, likely reactive. Esophagus is  unremarkable in appearance. No axillary lymphadenopathy. Lungs/Pleura: Large right pleural effusion again noted, lying dependently. This is associated with near complete passive atelectasis of the right lower lobe. Trace left pleural effusion with minimal passive subsegmental atelectasis in the dependent left lower lobe. No acute consolidative airspace disease. Previously noted tiny subpleural known nodule in the anterior aspect of the left lower lobe is no longer noted, presumably a reactive subpleural lymph node on the prior examination. Partially calcified small right-sided pleural plaque is unchanged (image 63 of series 5). No other calcified pleural plaques are identified. Tiny calcified granulomas are again noted in the periphery of the right lower lobe, now with an atelectatic lung. Musculoskeletal/Soft Tissues: There are no aggressive appearing lytic or blastic lesions noted in the visualized portions of the skeleton. CTA ABDOMEN AND PELVIS FINDINGS Hepatobiliary: Liver has a shrunken appearance and nodular contour, compatible with underlying cirrhosis. No discrete cystic or solid hepatic lesions. No intra or extrahepatic biliary ductal dilatation. Large rim calcified gallstone in the gallbladder measuring up to 3.8 cm in diameter. No current findings to suggest an acute cholecystitis at this time. Pancreas: No pancreatic mass or peripancreatic inflammatory changes. No pancreatic ductal dilatation. Pancreatic atrophy. No pancreatic or peripancreatic fluid. Spleen: Unremarkable. Adrenals/Urinary Tract: Bilateral kidneys and bilateral adrenal glands are unremarkable in appearance. No hydroureteronephrosis. In the dependent portion of the urinary bladder there is a 12 mm calculus (image 254 of series 5). Urinary bladder is otherwise unremarkable in appearance. Stomach/Bowel: Normal appearance of the stomach. No pathologic dilatation of small bowel or colon. Numerous colonic diverticulae are noted, without  surrounding inflammatory changes to suggest an acute diverticulitis at this time. The appendix is not confidently identified and may be surgically absent. Regardless, there are no inflammatory changes noted adjacent to the cecum to suggest the presence of an acute appendicitis at this time. Vascular/Lymphatic: Aortic atherosclerosis with vascular findings and measurements pertinent to potential TAVR procedure, as detailed below. No aneurysm or dissection identified in the abdominal or pelvic vasculature. Celiac axis, superior mesenteric artery and inferior mesenteric artery are all widely patent without hemodynamically significant stenosis. Single renal arteries bilaterally are widely patent. Portal vein is mildly dilated measuring 16 mm in the porta hepatis. Reproductive: Prostate gland and seminal vesicles are unremarkable in appearance. Other: Small left inguinal hernia containing a short segment of small bowel. No significant volume of ascites. No pneumoperitoneum. Musculoskeletal: There are no aggressive appearing lytic or blastic lesions noted in the visualized portions of the skeleton. VASCULAR MEASUREMENTS PERTINENT TO TAVR: AORTA: Minimal Aortic Diameter -  18 x 14 mm Severity  of Aortic Calcification -  moderate RIGHT PELVIS: Right Common Iliac Artery - Minimal Diameter - 11.0 x 9.1 mm Tortuosity - moderate Calcification - mild to moderate Right External Iliac Artery - Minimal Diameter - 8.7 x 8.9 mm Tortuosity - moderate to severe Calcification - none Right Common Femoral Artery - Minimal Diameter - 9.2 x 6.8 mm Tortuosity - mild Calcification - mild LEFT PELVIS: Left Common Iliac Artery - Minimal Diameter - 9.5 x 10.0 mm Tortuosity - severe Calcification - none Left External Iliac Artery - Minimal Diameter - 8.1 x 7.9 mm Tortuosity - moderate Calcification - none Left Common Femoral Artery - Minimal Diameter - 8.5 x 6.3 mm Tortuosity - mild Calcification - mild Review of the MIP images confirms the above  findings. IMPRESSION: 1. Vascular findings and measurements pertinent to potential TAVR procedure, as detailed above. This patient appears to have suitable pelvic arterial access, however, this access is most optimal on the right side secondary to the lesser degree of tortuosity. Additionally, there is a small left inguinal hernia containing a short segment of small bowel which comes in close proximity to the left common femoral artery. 2. Severe thickening calcification of what appears to be a bicuspid aortic valve, compatible with the reported clinical history of severe aortic stenosis. 3. Interval pericardiocentesis with persistent indwelling pericardiocentesis catheter, with significant decreased size of what is now only a small amount of residual pericardial fluid and/or thickening. 4. Large filling defect in the tip of the left atrial appendage, concerning for potential left atrial appendage thrombus (although this could simply reflect pseudo thrombus secondary to contrast bolus timing). Correlation with TEE is recommended, if not already performed. 5. Aortic atherosclerosis, in addition to 2 vessel coronary artery disease. Please note that although the presence of coronary artery calcium documents the presence of coronary artery disease, the severity of this disease and any potential stenosis cannot be assessed on this non-gated CT examination. Assessment for potential risk factor modification, dietary therapy or pharmacologic therapy may be warranted, if clinically indicated. 6. Persistent large right-sided pleural effusion with extensive passive atelectasis in the right lower lobe. Trace left pleural effusion with minimal dependent passive atelectasis in the left lower lobe. 7. Cholelithiasis without evidence of acute cholecystitis. 8. Colonic diverticulosis without evidence of acute diverticulitis at this time. 9. Morphologic changes in the liver compatible with underlying cirrhosis. 10. Additional  incidental findings, as above. Electronically Signed   By: Trudie Reed M.D.   On: 09/03/2016 12:24    PHYSICAL EXAM CVP ~9 General: NAD. sitting in the chair.  Neck: JVP 9-10 Lungs: Decreased breath sounds on right.  CV: Nondisplaced PMI.  Heart irregular S1/S2, no S3/S4, 2/6 SEM RUSB.  R and LLE 2+ edema. R chest tube in place.  Abdomen: Soft, NT, ND, no HSM. No bruits or masses. +BS  Neurologic: Alert and oriented x 3.  Psych: Normal affect. Extremities: No clubbing or cyanosis. RUE PICC  TELEMETRY: Reviewed, A fib 90-100s  ASSESSMENT AND PLAN: 65 y/o ?with a h/o severe AS, LV dysfxn, HTN, noncompliance, thyroid nodules, pericardial effusion, cholelithiasis, and persistent AF on eliquis, who presentedto clinic on 08/23/16 with volume overload and was admitted for further work up of AS and CHF.  1. Acute on chronic systolic CHF: EF 98-92% in setting of severe aortic stenosis.  May be due to severe aortic stenosis given lack of flow-limiting CAD.  This admission, he was initially hypotensive with SBP in 80s though BP has improved to 100s-110s range.  He was also markedly volume overloaded. PICC was placed, low output confirmed by co-ox 47%.  Milrinone 0.25 started then increased to 0.375, now down to 0.25.  Repeat echo post-TAVR with EF up to 45%.  Repeat limited echo 10/26 with small pericardial effusion, no tamponade.  Developed suspected septic shock most recently, BP better now and off pressors.  Today, CVP 9  with co-ox 65%.  BP stable.  - Continue  Lasix 40 mg IV bid today.    - Stopped spironolactone, digoxin, and lisinopril.  - Cut back milrinone at 0.125 mcg.   - Add ted hose.  2. Hypotension:  Pleural effusion drained, no pericardial tamponade, EF up to 45% and valve functioning ok (mildly elevated gradient).  Co-ox has been ok.  Suspect septic shock with rise in WBCs and high procalcitonin though no fever.  ?Right-sided PNA.  Blood cultures no growth so far.  Added empiric  antibiotics with vancomycin/Zosyn => now off vanco, continue Zosyn x 8 days. Hydrocortisone added by CCM.  Now weaned off pressors and procalcitonin coming down.  SBP ok today with A line.  Can discontinue arterial line and start weaning down hydrocortisone.  3. Aortic stenosis: Severe AS. 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 - Bioprosthetic valve stable on post-op echo though mean gradient a bit high at 20 mmHg.  4. AKI on CKD stage III: Baseline creatinine 1.7-1.8. Creatinine starting to trend down. Today, creatinine trending down to 2.76 with resolution of septic shock..    5. Atrial fibrillation: Persistent, with RVR this admission.  He has been in atrial fibrillation at least since 6/17.  No prior DCCV.  Not sure how long prior to 6/17 he was in atrial fibrillation.  - Has been  On amio drip since 10/13. Remains in A Fib. Stop amio drip and start amio 200 mg twice a day.   - He had developing thrombus on TEE => however, heparin now held with bleeding into his right chest.   - With LAA thrombus despite Eliquis, will use Coumadin for home. Eventual goal 2.5 - 3.0.  On coumadin. INR 1.3.  No heparin as chest tube still in and draining.  - Will not be able to do DCCV at this point with atrial thrombus.  6. Pericardial effusion: Moderate to large, no tamponade.  s/p pericardiocentesis with Dr Clifton James 10/20. Pericardial drain removed 09/05/16.  Repeat echo 10/27 with small effusion, no tamponade.   7. Cirrhosis: Noted on prior abdominal CT, LFTs improved with diuresis.  8. CAD:  LHC with moderate coronary disease, nothing flow-limiting.  - Continue statin and 81 mg aspirin.  9. Hyponatremia: Fluid restrict. Sodium trending up from 124>126. 10. Hemothorax: On right.  Chest tube still draining, hopefully out tomorrow.  11. Anemia: Hgb fairly stable today, Fe studies looked ok.  Follow closely.   Amy Clegg NP-C .  09/06/2016  Patient seen with NP, agree with the above note.  Continued  improvement.  Creatinine lower today.  Diuresing well, CVP coming down.   - Decrease milrinone to 0.125 today.  - Continue current IV Lasix.  - Can remove arterial line.   Chest tube still draining, likely out tomorrow.   Continue warfarin, INR 1.3.  Persistent atrial fibrillation, rate ok.  Transition to po amiodarone.  Will keep on amiodarone as hope to eventually cardiovert, but will need to be anticoagulated x 1 month fully prior give LAA thrombus.   Possible HCAP.  Off vanco now, continue Zosyn x 8 days  total.   40 minutes critical care time.   Marca AnconaDalton Boby Eyer 09/11/2016 1:05 PM

## 2016-09-12 ENCOUNTER — Inpatient Hospital Stay (HOSPITAL_COMMUNITY): Payer: Commercial Managed Care - HMO

## 2016-09-12 LAB — CBC WITH DIFFERENTIAL/PLATELET
Basophils Absolute: 0 10*3/uL (ref 0.0–0.1)
Basophils Relative: 0 %
EOS ABS: 0 10*3/uL (ref 0.0–0.7)
EOS PCT: 0 %
HEMATOCRIT: 24.9 % — AB (ref 39.0–52.0)
Hemoglobin: 8.3 g/dL — ABNORMAL LOW (ref 13.0–17.0)
LYMPHS PCT: 5 %
Lymphs Abs: 0.6 10*3/uL — ABNORMAL LOW (ref 0.7–4.0)
MCH: 27.5 pg (ref 26.0–34.0)
MCHC: 33.3 g/dL (ref 30.0–36.0)
MCV: 82.5 fL (ref 78.0–100.0)
MONO ABS: 0.9 10*3/uL (ref 0.1–1.0)
Monocytes Relative: 7 %
NEUTROS PCT: 88 %
Neutro Abs: 10.9 10*3/uL — ABNORMAL HIGH (ref 1.7–7.7)
PLATELETS: 144 10*3/uL — AB (ref 150–400)
RBC: 3.02 MIL/uL — AB (ref 4.22–5.81)
RDW: 19 % — AB (ref 11.5–15.5)
WBC: 12.4 10*3/uL — AB (ref 4.0–10.5)

## 2016-09-12 LAB — RENAL FUNCTION PANEL
ANION GAP: 8 (ref 5–15)
Albumin: 2 g/dL — ABNORMAL LOW (ref 3.5–5.0)
BUN: 47 mg/dL — ABNORMAL HIGH (ref 6–20)
CHLORIDE: 99 mmol/L — AB (ref 101–111)
CO2: 24 mmol/L (ref 22–32)
CREATININE: 2.5 mg/dL — AB (ref 0.61–1.24)
Calcium: 8.2 mg/dL — ABNORMAL LOW (ref 8.9–10.3)
GFR, EST AFRICAN AMERICAN: 29 mL/min — AB (ref >60)
GFR, EST NON AFRICAN AMERICAN: 25 mL/min — AB (ref >60)
Glucose, Bld: 143 mg/dL — ABNORMAL HIGH (ref 65–99)
POTASSIUM: 4 mmol/L (ref 3.5–5.1)
Phosphorus: 3.7 mg/dL (ref 2.5–4.6)
Sodium: 131 mmol/L — ABNORMAL LOW (ref 135–145)

## 2016-09-12 LAB — COOXEMETRY PANEL
Carboxyhemoglobin: 1.9 % — ABNORMAL HIGH (ref 0.5–1.5)
Carboxyhemoglobin: 1.8 % — ABNORMAL HIGH (ref 0.5–1.5)
Methemoglobin: 1.2 % (ref 0.0–1.5)
Methemoglobin: 1 % (ref 0.0–1.5)
O2 SAT: 51.5 %
O2 Saturation: 65.7 %
TOTAL HEMOGLOBIN: 8.7 g/dL — AB (ref 12.0–16.0)
TOTAL HEMOGLOBIN: 8.4 g/dL — AB (ref 12.0–16.0)

## 2016-09-12 LAB — PROTIME-INR
INR: 1.87
Prothrombin Time: 21.8 seconds — ABNORMAL HIGH (ref 11.4–15.2)

## 2016-09-12 MED ORDER — FUROSEMIDE 40 MG PO TABS
40.0000 mg | ORAL_TABLET | Freq: Two times a day (BID) | ORAL | Status: DC
  Administered 2016-09-12: 40 mg via ORAL
  Filled 2016-09-12: qty 1

## 2016-09-12 MED ORDER — WARFARIN SODIUM 5 MG PO TABS
5.0000 mg | ORAL_TABLET | Freq: Once | ORAL | Status: AC
  Administered 2016-09-12: 5 mg via ORAL
  Filled 2016-09-12: qty 1

## 2016-09-12 MED ORDER — FUROSEMIDE 10 MG/ML IJ SOLN: 40.0000 mg | Freq: Two times a day (BID) | INTRAMUSCULAR | Status: DC

## 2016-09-12 NOTE — Progress Notes (Signed)
ANTICOAGULATION CONSULT NOTE - Follow Up Consult  Pharmacy Consult for warfarin Indication: atrial fibrillation and atrial thrombus  Allergies  Allergen Reactions  . Potassium-Containing Compounds Other (See Comments)    Upset stomach    Patient Measurements: Height: 5\' 8"  (172.7 cm) Weight: 186 lb 11.7 oz (84.7 kg) IBW/kg (Calculated) : 68.4  Vital Signs: Temp: 98 F (36.7 C) (10/31 0700) Temp Source: Oral (10/31 0700) BP: 103/64 (10/31 0800) Pulse Rate: 67 (10/31 0700)  Labs:  Recent Labs  09/10/16 0400 09/11/16 0350 09/12/16 0420  HGB 8.3* 8.1* 8.3*  HCT 24.8* 24.2* 24.9*  PLT 92* 109* 144*  LABPROT 18.4* 16.3* 21.8*  INR 1.52 1.31 1.87  CREATININE 2.94* 2.76* 2.50*    Estimated Creatinine Clearance: 30.4 mL/min (by C-G formula based on SCr of 2.5 mg/dL (H)).  Assessment: 65yom on eliquis pta for afib, transitioned to heparin for pericardiocentesis on 10/20 with drain placement. Drain removed 10/24 w/high risk TAVR same day. Patient initially started on heparin bridge to warfarin post procedure for history of afib and developing atrial thrombus that was seen on TEE, however anticoagulation held with bleeding into his right chest. Warfarin restarted without heparin 10/29 as chest tube still in and draining. INR is now trending up (1.87).  Goal of Therapy:  Heparin level 0.3-0.7 units/ml  INR 2.5-3 Monitor platelets by anticoagulation protocol: Yes   Plan:  1) Warfarin 5mg *1 tonight 2) Daily INR, CBC 3) Warfarin education when out of ICU  Sherron Monday, PharmD Clinical Pharmacy Resident Pager: [phone] 09/12/16 8:48 AM

## 2016-09-12 NOTE — Care Management Important Message (Signed)
Important Message  Patient Details  Name: Lucas Munoz MRN: [id number] Date of Birth: 1951/07/09   Medicare Important Message Given:  Yes    Vickie Ponds [place] 09/12/2016, 11:36 AM

## 2016-09-12 NOTE — Progress Notes (Signed)
error 

## 2016-09-12 NOTE — Progress Notes (Signed)
Cardiology fellow text-paged regarding pt request for Pepcid for heartburn/indigestion. Pt irritable, states that he always takes 10mg  Pepcid at night at home, voices frustration that they switched to Protonix while in hospital. Trenton Gammon understanding regarding how each medication works and why pharmacy may've switched to the Protonix. However, still verbalizes frustration that he can't have Pepcid PRN at night. Continued to verbalize frustration even after another RN came in and educated him about these medications.   Text-paged the cardiology fellow asking if it might be possible to get pt a one-time PRN dose of Pepcid for tonight. Have not heard back from fellow as of 22:05. Will continue to monitor. Will ask day shift RN to address with MD in AM.   Peyton Bottoms, RN 09/12/16

## 2016-09-12 NOTE — Progress Notes (Signed)
Patient ID: Lucas Munoz, male   DOB: [birth year], 65 y.o.   MRN: [id number]    SUBJECTIVE:   PICC placed , initial co-ox on 10/14 47%.  He was started on milrinone. Milrinone increased to 0.375 mcg 08/29/16 for low mixed venous sat on RHC (CI 1.9).  Underwent pericardiocentesis on 10/20 with 775cc out. Fluid transudative. Drained another 225 cc yesterday  TEE 10/23 confirmed amorphous material in LA appendage, appears to be forming thrombus.  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   Post-TAVR echo 10/25 with EF up to 45%, moderate LVH, bioprosthetic AVR with mean gradient 20 (higher than expected), PASP 37, normal RV, no pericardial effusion.   On 10/26 developed nausea/vomiting and hypotension.  He was started on norepinephrine and IV fluid with low CVP. CXR showed large right pleural effusion accumulation. Chest tube replaced on right, 1300 cc bloody fluid out.  Heparin stopped. He remained hypotensive.  1 unit PRBCs given. Also noted rise in creatinine with poor UOP.  Echo 10/26 with EF about 45%, small pericardial effusion with no tamponade.   PCT up to 4.63, possible HCAP.  Suspect septic shock.   Doing better today.  Off pressors now.  Creatinine 3.71 => 3.22 => 2.94=>2.76 => 2.5.  CVP 9. Today CO-OX is 52%.     Chest tube drainage slowing.   Denies SOB. Feels good.    RHC/LHC (08/29/16) Left Main  Mild ostial narrowing.  Left Anterior Descending  30% proximal LAD stenosis. 60% distal LAD stenosis. Moderate D1 without significant disease. Small to moderate D2 with long 70% ostial stenosis.  Left Circumflex  50% proximal LCx stenosis at OM1. Moderate OM1 with 30% ostial stenosis.  Right Coronary Artery  40% proximal and 40% mid RCA stenosis.   RA mean 19 RV 65/15 PA 69/34, mean 51 PCWP mean 43 (not sure we got an accurate PCWP).  AO 106/87 Oxygen saturations: PA 46% AO 92% Cardiac Output (Fick) 3.8  Cardiac Index (Fick) 1.9  Echo  (10/14): EF 15%, severe AS, moderately decreased RV systolic function, PASP 65 mmHg, moderate to large pericardial effusion without tamponade.   Scheduled Meds: . sodium chloride   Intravenous Once  . sodium chloride   Intravenous Once  . acetaminophen (TYLENOL) oral liquid 160 mg/5 mL  650 mg Per Tube Once   Or  . acetaminophen  650 mg Rectal Once  . amiodarone  200 mg Oral BID  . aspirin  81 mg Oral Daily  . atorvastatin  40 mg Oral q1800  . furosemide  40 mg Oral BID  . hydrocortisone sodium succinate  50 mg Intravenous Q12H  . mouth rinse  15 mL Mouth Rinse BID  . pantoprazole  40 mg Oral Daily  . piperacillin-tazobactam (ZOSYN)  IV  3.375 g Intravenous Q8H  . polyethylene glycol  17 g Oral Daily  . sodium chloride flush  10-40 mL Intracatheter Q12H  . sodium chloride flush  3 mL Intravenous Q12H  . warfarin  5 mg Oral ONCE-1800  . Warfarin - Pharmacist Dosing Inpatient   Does not apply q1800   Continuous Infusions: . milrinone 0.125 mcg/kg/min (09/12/16 0400)   PRN Meds:.sodium chloride, Place/Maintain arterial line **AND** sodium chloride, acetaminophen, alum & mag hydroxide-simeth, docusate sodium, lactulose, morphine injection, ondansetron (ZOFRAN) IV, oxyCODONE, oxyCODONE-acetaminophen, sodium chloride flush, sodium chloride flush, traMADol, traZODone, white petrolatum   Vitals:   09/12/16 0500 09/12/16 0600 09/12/16 0700 09/12/16 0800  BP: 113/61 105/62 (!) 155/72 103/64  Pulse: 91 79 67   Resp: 17 15 (!) 25 (!) 9  Temp:   98 F (36.7 C)   TempSrc:   Oral   SpO2: 95% 97% 96%   Weight: 186 lb 11.7 oz (84.7 kg)     Height:        Intake/Output Summary (Last 24 hours) at 09/12/16 0853 Last data filed at 09/12/16 0800  Gross per 24 hour  Intake           567.74 ml  Output             2065 ml  Net         -1497.26 ml    LABS: Basic Metabolic Panel:  Recent Labs  16/08/9609/30/17 0350 09/12/16 0420  NA 126* 131*  K 4.2 4.0  CL 94* 99*  CO2 22 24  GLUCOSE 133*  143*  BUN 43* 47*  CREATININE 2.76* 2.50*  CALCIUM 8.1* 8.2*  PHOS 4.0 3.7   Liver Function Tests:  Recent Labs  09/11/16 0350 09/12/16 0420  ALBUMIN 2.0* 2.0*   No results for input(s): LIPASE, AMYLASE in the last 72 hours. CBC:  Recent Labs  09/11/16 0350 09/12/16 0420  WBC 12.9* 12.4*  NEUTROABS 11.5* 10.9*  HGB 8.1* 8.3*  HCT 24.2* 24.9*  MCV 79.9 82.5  PLT 109* 144*   Cardiac Enzymes: No results for input(s): CKTOTAL, CKMB, CKMBINDEX, TROPONINI in the last 72 hours. BNP: Invalid input(s): POCBNP D-Dimer: No results for input(s): DDIMER in the last 72 hours. Hemoglobin A1C: No results for input(s): HGBA1C in the last 72 hours. Fasting Lipid Panel: No results for input(s): CHOL, HDL, LDLCALC, TRIG, CHOLHDL, LDLDIRECT in the last 72 hours. Thyroid Function Tests:  Recent Labs  09/09/16 1612  T3FREE 1.6*   Anemia Panel:  Recent Labs  09/10/16 1030  FERRITIN 476*  TIBC 353  IRON 67    RADIOLOGY: [facility]  Result Date: 09/08/2016 CLINICAL DATA:  Acute renal failure EXAM: RENAL / URINARY TRACT ULTRASOUND COMPLETE COMPARISON:  CT abdomen pelvis 04/11/2016 FINDINGS: Right Kidney: Length: 10.8 cm. Negative for obstruction or mass. Limited evaluation the right kidney due to chest tube and bandages. Left Kidney: Length: 12.2 cm. Echogenicity within normal limits. No mass or hydronephrosis visualized. Bladder: Appears normal for degree of bladder distention. IMPRESSION: Negative renal ultrasound. Electronically Signed   By: Marlan Palauharles  Clark M.D.   On: 09/08/2016 16:09   Ct Cardiac Morph/pulm Vein W/cm&w/o Ca Score  Addendum Date: 09/01/2016   ADDENDUM REPORT: 09/01/2016 17:18 CLINICAL DATA:  Aortic stenosis EXAM: Cardiac TAVR CT TECHNIQUE: The patient was scanned on a Philips 256 scanner. A 120 kV retrospective scan was triggered in the descending thoracic aorta at 111 HU's. Gantry rotation speed was 270 msecs and collimation was .9 mm. No beta blockade or nitro  were given. The 3D data set was reconstructed in 5% intervals of the R-R cycle. Systolic and diastolic phases were analyzed on a dedicated work station using MPR, MIP and VRT modes. The patient received 80 cc of contrast. 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 Heavily calcified Aorta:  Aortic root dilated Sinotubular Junction:  32 mm Ascending Thoracic Aorta:  43 mm Aortic Arch:  30 mm Descending Thoracic Aorta:  26 mm Sinus of Valsalva Measurements: Non-coronary:  33 mm Right -coronary:  31 mm Left -coronary:  32 mm Coronary Artery Height above Annulus: Left Main:  19 mm above annulus Right Coronary:  10 mm above annulus  Virtual Basal Annulus Measurements: Maximum/Minimum Diameter:  27.1 x 20.3 mm Perimeter:  78 mm Area:  442 mm2 Coronary Arteries:  Sufficient height above annulus for deployment Optimum Fluoroscopic Angle for Delivery:  LAO 29 degrees IMPRESSION: 1) functionally bicuspid AV heavily calcified with annulus suitable for 26 mm Sapien 3 valve 2) Moderate aortic root enlargement 4.3 cm 3) Large pericardial effusion patient to have pericardiocentesis post CT 4) Cannot r/o LAA thrombus suggest TEE correlation 5) Optimum angiographic angle for deployment LAO 29 degrees 6) Coronary heights sufficient for deployment Charlton Haws Electronically Signed   By: Charlton Haws M.D.   On: 09/01/2016 17:18   Result Date: 09/01/2016 EXAM: OVER-READ INTERPRETATION  CT CHEST The following report is an over-read performed by radiologist Dr. Royal Piedra [facility] on 09/01/2016. This over-read does not include interpretation of cardiac or coronary anatomy or pathology. The coronary calcium score/coronary CTA interpretation by the cardiologist is attached. COMPARISON:  Chest CT 05/01/2016. FINDINGS: Large pericardial effusion. No pericardial calcification. Aortic atherosclerosis with aneurysmal dilatation of the ascending thoracic aorta which measures up to 4.8  cm in diameter (unchanged). Large right-sided pleural effusion layering dependently. Extensive passive atelectasis in the right lower lobe. Tiny calcified granuloma in the periphery of the right lower lobe. 4 mm subpleural noncalcified pulmonary nodule in the left lower lobe abutting the major fissure (image 71 of series 512), unchanged compared to prior study 05/01/2016, favored to represent a subpleural lymph node. No other suspicious appearing pulmonary nodules or masses are noted in the visualized portions of the thorax. Partially calcified pleural plaque in the right hemithorax (image 31 of series 513) again noted. No calcified pleural plaques in the left hemithorax. Visualized portions of the upper abdomen demonstrate a nodular contour of the liver, suggestive of underlying cirrhosis. No aggressive appearing lytic or blastic lesions are noted in the visualized portions of the skeleton. Right upper extremity PICC with tip terminating in the distal superior vena cava. IMPRESSION: 1. Interval increased size of what is now a large pericardial effusion. No associated pericardial calcification. 2. Enlargement of a large layering right-sided pleural effusion. This is associated with extensive passive atelectasis in the right lower lobe. 3. Aortic atherosclerosis, with similar aneurysmal dilatation of the ascending thoracic aorta which measures up to 4.8 cm in diameter. 4. Morphologic changes in the liver indicative of cirrhosis, as above. 5. 4 mm subpleural nodule in the periphery of the left lower lobe is nonspecific, but statistically likely a subpleural lymph node. No follow-up needed if patient is low-risk. Non-contrast chest CT can be considered in 12 months if patient is high-risk. This recommendation follows the consensus statement: Guidelines for Management of Incidental Pulmonary Nodules Detected on CT Images: From the Fleischner Society 2017; Radiology 2017; 284:228-243. 6. Additional incidental findings,  as above. Electronically Signed: By: Trudie Reed M.D. On: 09/01/2016 14:38   Dg Chest Port 1 View  Result Date: 09/11/2016 CLINICAL DATA:  Respiratory failure, shortness of breath, status post aortic valve replacement on October24, 2017. EXAM: PORTABLE CHEST 1 VIEW COMPARISON:  Portable chest x-rays of September 05, 2016 and September 10, 2016. FINDINGS: The left lung is well-expanded and clear. On the right no pneumothorax is evident. There is a small right pleural effusion. The interstitial markings remain prominent especially at the lung base. The right-sided chest tube tip projects over the inferior margin of the posterior aspect of the right sixth rib. The cardiac silhouette remains enlarged. The pulmonary vascularity is less engorged. There is calcification  in the wall of the aortic arch. There degenerative changes of the shoulders and there is an old healed midshaft right clavicular fracture. The right-sided PICC line tip projects over the midportion of the SVC. IMPRESSION: Mild improvement in the pulmonary interstitium overall consistent with decreasing interstitial edema. Persistent small right pleural effusion. The right-sided chest tube is in stable position. Electronically Signed   By: David  [place] M.D.   On: 09/11/2016 07:40   Dg Chest Port 1 View  Result Date: 09/10/2016 CLINICAL DATA:  Chest congestion.  Cough. EXAM: PORTABLE CHEST 1 VIEW COMPARISON:  Yesterday. FINDINGS: Stable enlarged cardiac silhouette and transcatheter aortic valve. The right PICC is unchanged. A right chest tube remains in place with no pneumothorax. The pulmonary vasculature and interstitial markings remain prominent. Thoracic spine degenerative changes. IMPRESSION: Stable cardiomegaly and changes of congestive heart failure. Electronically Signed   By: Beckie Salts M.D.   On: 09/10/2016 08:14   Dg Chest Port 1 View  Result Date: 09/09/2016 CLINICAL DATA:  Status post TAVR and right chest tube placement for  hemothorax. EXAM: PORTABLE CHEST 1 VIEW COMPARISON:  09/08/2016 FINDINGS: Right chest tube remains in place with no significant pleural fluid volume. No pneumothorax. There remains atelectasis in the right lower lung. PICC line positioning stable in the distal SVC. Stable cardiac enlargement and appearance of transcatheter aortic valve. Suspect mild residual interstitial edema which appears stable. IMPRESSION: No significant residual right pleural fluid volume with atelectasis remaining in the right lower lung. Stable mild interstitial edema. Electronically Signed   By: Irish Lack M.D.   On: 09/09/2016 09:13   Dg Chest Port 1 View  Result Date: 09/08/2016 CLINICAL DATA:  Shortness of breath.  CHF. EXAM: PORTABLE CHEST 1 VIEW COMPARISON:  09/07/2016. FINDINGS: Right PICC line and right chest tube in stable position. No pneumothorax. Right pleural effusion is improved slightly. Cardiomegaly with diffuse pulmonary interstitial prominence mild congestive heart failure cannot be excluded . IMPRESSION: 1. Right PICC line right chest tube in stable position. No pneumothorax. Right pleural effusion has improved slightly. 2. With bilateral from interstitial prominence. Mild congestive heart failure cannot be excluded . Electronically Signed   By: Maisie Fus  Register   On: 09/08/2016 08:48   Dg Chest Port 1 View  Result Date: 09/07/2016 CLINICAL DATA:  Hemothorax with new right chest tube. EXAM: PORTABLE CHEST 1 VIEW COMPARISON:  Earlier today FINDINGS: Decreased but still extensive right hemothorax after chest tube placement. No visualized gas component. Right upper extremity PICC with tip at the SVC level. Unchanged cardiopericardial enlargement. Status post transcatheter aortic valve replacement. IMPRESSION: 1. Right chest tube placement with diminished hemothorax. 2. Elsewhere stable chest. Electronically Signed   By: Marnee Spring M.D.   On: 09/07/2016 09:05   Dg Chest Port 1 View  Result Date:  09/07/2016 CLINICAL DATA:  Vomiting EXAM: PORTABLE CHEST 1 VIEW COMPARISON:  Yesterday FINDINGS: Stable right upper extremity PICC. Swan-Ganz catheter and right jugular introducer have been removed. Right chest tube is been removed. A large right pleural effusion has accumulated. There is compressive atelectasis within much of the right lung. Vascular congestion. Left lung is otherwise clear. Cardiomegaly. Small right pneumothorax is suspected. IMPRESSION: Right chest tube removed. Large right pleural effusion has reaccumulated. Very small right apical pneumothorax has also developed. Electronically Signed   By: Jolaine Click M.D.   On: 09/07/2016 07:39   Dg Chest Port 1 View  Result Date: 09/06/2016 CLINICAL DATA:  Cardiac surgery EXAM: PORTABLE CHEST 1 VIEW  COMPARISON:  Yesterday FINDINGS: Right upper extremity PICC, right chest tube, right jugular introducer, right Swan-Ganz catheter are stable. There is no pneumothorax. Small right pleural effusion is stable. Vascular congestion and mild edema are worse. Lower lung volumes. Cardiac silhouette remains markedly enlarged. IMPRESSION: Worsening vascular congestion and edema. No pneumothorax. Electronically Signed   By: Jolaine Click M.D.   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 Postoperative image. EXAM: PORTABLE CHEST 1 VIEW COMPARISON:  CT chest 09/01/2016 and single view of the chest 08/26/2016. FINDINGS: Endotracheal tube is in place with tip in good position at the level of the clavicular heads. Right IJ approach Swan-Ganz catheter tip is in the proximal right main pulmonary artery. Tip of right PICC projects in the lower superior vena cava. NG tube courses into the stomach. Right chest tube is identified. There is a small right pleural effusion and basilar atelectasis, improved since the prior chest film. The lungs are otherwise clear. The cardiopericardial  silhouette is enlarged. No pneumothorax. IMPRESSION: Support tubes and lines projecting good position. Negative for pneumothorax. Decreased small right pleural effusion and basilar atelectasis. Enlarged cardiopericardial silhouette consistent with cardiomegaly and pericardial effusion as seen on CT scan. Electronically Signed   By: Drusilla Kanner M.D.   On: 09/05/2016 13:56   Dg Chest Port 1 View  Result Date: 08/26/2016 CLINICAL DATA:  65 year old male with a history of shortness of breath EXAM: PORTABLE CHEST 1 VIEW COMPARISON:  08/25/2016, CT chest 05/01/2016 FINDINGS: Compare to the prior plain film there is unchanged size of the cardiac silhouette. Calcifications of the aortic arch. Interval placement of right upper extremity PICC with the tip appearing to terminate at the superior cavoatrial junction. Increasing opacity at the right base with thickening of the minor fissure, obscuration the right hemidiaphragm and right heart border. Interlobular septal thickening. IMPRESSION: Evidence of CHF with increasing right-sided pleural effusion/atelectasis. Unchanged cardiac silhouette in this patient with a history of pericardial effusion imaged on prior CT 05/01/2016. Interval placement of right upper extremity PICC. Aortic atherosclerosis. Signed, Yvone Neu. Loreta Ave, DO [facility] Electronically Signed   By: Gilmer Mor D.O.   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 Dilated cardiomyopathy. Acute kidney injury. EXAM: PORTABLE CHEST 1 VIEW COMPARISON:  None. FINDINGS: Marked cardiac enlargement noted. Diffuse interstitial infiltrates consistent with mild interstitial edema. Small right pleural effusion also seen with right basilar atelectasis. IMPRESSION: Mild congestive heart failure, with small right pleural effusion and right basilar atelectasis.  Electronically Signed   By: Myles Rosenthal M.D.   On: 08/25/2016 15:14   Ct Angio Chest/abd/pel For Dissection W And/or W/wo  Result Date: 09/03/2016 CLINICAL DATA:  65 year old male with history of severe aortic stenosis. Preprocedural study prior to potential transcatheter aortic valve replacement. Recent history of pericardiocentesis. EXAM: CT ANGIOGRAPHY CHEST, ABDOMEN AND PELVIS TECHNIQUE: Multidetector CT imaging through the chest, abdomen and pelvis was performed using the standard protocol during bolus administration of intravenous contrast. Multiplanar reconstructed images and MIPs were obtained and reviewed to evaluate the vascular anatomy. CONTRAST:  80 mL of Isovue 370. COMPARISON:  Cardiac gated CTA 09/01/2016. FINDINGS: CTA CHEST FINDINGS Cardiovascular: Heart size is mildly enlarged. Small amount of residual pericardial fluid and/or thickening, decreased compared to the prior study. Pericardiocentesis catheter in place with  tip terminating high in a superior pericardial recess adjacent to the aortic arch. No pericardial calcification. Severe thickening calcification of the aortic valve, which has an appearance suggestive of a bicuspid valve. There is aortic atherosclerosis, as well as atherosclerosis of the great vessels of the mediastinum and the coronary arteries, including calcified atherosclerotic plaque in the left anterior descending and right coronary arteries. 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 Large filling defect within the left atrial appendage, concerning for potential left atrial appendage thrombus. Right upper extremity PICC with tip terminating at the superior cavoatrial junction. Mediastinum/Lymph Nodes: Several borderline enlarged and minimally enlarged mediastinal lymph nodes measuring up to 14 mm in short axis, slightly increased compared to the prior study, likely reactive. Esophagus is unremarkable in appearance. No axillary  lymphadenopathy. Lungs/Pleura: Large right pleural effusion again noted, lying dependently. This is associated with near complete passive atelectasis of the right lower lobe. Trace left pleural effusion with minimal passive subsegmental atelectasis in the dependent left lower lobe. No acute consolidative airspace disease. Previously noted tiny subpleural known nodule in the anterior aspect of the left lower lobe is no longer noted, presumably a reactive subpleural lymph node on the prior examination. Partially calcified small right-sided pleural plaque is unchanged (image 63 of series 5). No other calcified pleural plaques are identified. Tiny calcified granulomas are again noted in the periphery of the right lower lobe, now with an atelectatic lung. Musculoskeletal/Soft Tissues: There are no aggressive appearing lytic or blastic lesions noted in the visualized portions of the skeleton. CTA ABDOMEN AND PELVIS FINDINGS Hepatobiliary: Liver has a shrunken appearance and nodular contour, compatible with underlying cirrhosis. No discrete cystic or solid hepatic lesions. No intra or extrahepatic biliary ductal dilatation. Large rim calcified gallstone in the gallbladder measuring up to 3.8 cm in diameter. No current findings to suggest an acute cholecystitis at this time. Pancreas: No pancreatic mass or peripancreatic inflammatory changes. No pancreatic ductal dilatation. Pancreatic atrophy. No pancreatic or peripancreatic fluid. Spleen: Unremarkable. Adrenals/Urinary Tract: Bilateral kidneys and bilateral adrenal glands are unremarkable in appearance. No hydroureteronephrosis. In the dependent portion of the urinary bladder there is a 12 mm calculus (image 254 of series 5). Urinary bladder is otherwise unremarkable in appearance. Stomach/Bowel: Normal appearance of the stomach. No pathologic dilatation of small bowel or colon. Numerous colonic diverticulae are noted, without surrounding inflammatory changes to suggest  an acute diverticulitis at this time. The appendix is not confidently identified and may be surgically absent. Regardless, there are no inflammatory changes noted adjacent to the cecum to suggest the presence of an acute appendicitis at this time. Vascular/Lymphatic: Aortic atherosclerosis with vascular findings and measurements pertinent to potential TAVR procedure, as detailed below. No aneurysm or dissection identified in the abdominal or pelvic vasculature. Celiac axis, superior mesenteric artery and inferior mesenteric artery are all widely patent without hemodynamically significant stenosis. Single renal arteries bilaterally are widely patent. Portal vein is mildly dilated measuring 16 mm in the porta hepatis. Reproductive: Prostate gland and seminal vesicles are unremarkable in appearance. Other: Small left inguinal hernia containing a short segment of small bowel. No significant volume of ascites. No pneumoperitoneum. Musculoskeletal: There are no aggressive appearing lytic or blastic lesions noted in the visualized portions of the skeleton. VASCULAR MEASUREMENTS PERTINENT TO TAVR: AORTA: Minimal Aortic Diameter -  18 x 14 mm Severity of Aortic Calcification -  moderate RIGHT PELVIS: Right Common Iliac Artery - Minimal Diameter - 11.0 x 9.1 mm Tortuosity - moderate  Calcification - mild to moderate Right External Iliac Artery - Minimal Diameter - 8.7 x 8.9 mm Tortuosity - moderate to severe Calcification - none Right Common Femoral Artery - Minimal Diameter - 9.2 x 6.8 mm Tortuosity - mild Calcification - mild LEFT PELVIS: Left Common Iliac Artery - Minimal Diameter - 9.5 x 10.0 mm Tortuosity - severe Calcification - none Left External Iliac Artery - Minimal Diameter - 8.1 x 7.9 mm Tortuosity - moderate Calcification - none Left Common Femoral Artery - Minimal Diameter - 8.5 x 6.3 mm Tortuosity - mild Calcification - mild Review of the MIP images confirms the above findings. IMPRESSION: 1. Vascular findings  and measurements pertinent to potential TAVR procedure, as detailed above. This patient appears to have suitable pelvic arterial access, however, this access is most optimal on the right side secondary to the lesser degree of tortuosity. Additionally, there is a small left inguinal hernia containing a short segment of small bowel which comes in close proximity to the left common femoral artery. 2. Severe thickening calcification of what appears to be a bicuspid aortic valve, compatible with the reported clinical history of severe aortic stenosis. 3. Interval pericardiocentesis with persistent indwelling pericardiocentesis catheter, with significant decreased size of what is now only a small amount of residual pericardial fluid and/or thickening. 4. Large filling defect in the tip of the left atrial appendage, concerning for potential left atrial appendage thrombus (although this could simply reflect pseudo thrombus secondary to contrast bolus timing). Correlation with TEE is recommended, if not already performed. 5. Aortic atherosclerosis, in addition to 2 vessel coronary artery disease. Please note that although the presence of coronary artery calcium documents the presence of coronary artery disease, the severity of this disease and any potential stenosis cannot be assessed on this non-gated CT examination. Assessment for potential risk factor modification, dietary therapy or pharmacologic therapy may be warranted, if clinically indicated. 6. Persistent large right-sided pleural effusion with extensive passive atelectasis in the right lower lobe. Trace left pleural effusion with minimal dependent passive atelectasis in the left lower lobe. 7. Cholelithiasis without evidence of acute cholecystitis. 8. Colonic diverticulosis without evidence of acute diverticulitis at this time. 9. Morphologic changes in the liver compatible with underlying cirrhosis. 10. Additional incidental findings, as above. Electronically  Signed   By: Trudie Reed M.D.   On: 09/03/2016 12:24    PHYSICAL EXAM CVP ~9 General: NAD. sitting in the chair.  Neck: JVP 9-10 Lungs: Decreased breath sounds on right.  CV: Nondisplaced PMI.  Heart irregular S1/S2, no S3/S4, 2/6 SEM RUSB.  1+ ankle edema. R chest tube in place.  Abdomen: Soft, NT, ND, no HSM. No bruits or masses. +BS  Neurologic: Alert and oriented x 3.  Psych: Normal affect. Extremities: No clubbing or cyanosis. RUE PICC  TELEMETRY: Reviewed, A fib 80s-90s  ASSESSMENT AND PLAN: 65 y/o ?with a h/o severe AS, LV dysfxn, HTN, noncompliance, thyroid nodules, pericardial effusion, cholelithiasis, and persistent AF on eliquis, who presentedto clinic on 08/23/16 with volume overload and was admitted for further work up of AS and CHF.  1. Acute on chronic systolic CHF: EF 16-10% in setting of severe aortic stenosis.  May be due to severe aortic stenosis given lack of flow-limiting CAD.  This admission, he was initially hypotensive with SBP in 80s though BP has improved to 100s-110s range.  He was also markedly volume overloaded. PICC was placed, low output confirmed by co-ox 47%.  Milrinone 0.25 started then increased to 0.375, now  down to 0.25.  Repeat echo post-TAVR with EF up to 45%.  Repeat limited echo 10/26 with small pericardial effusion, no tamponade.  Developed suspected septic shock most recently, BP better now and off pressors. Today, CVP 9  with co-ox lower at 52%.  BP stable.  - Continue Lasix 40 mg IV bid today.    - Stopped spironolactone, digoxin, and lisinopril with AKI.  - Continue milrinone at 0.125 for now, repeat co-ox.   - Wear Ted hose.  2. Hypotension:  Pleural effusion drained, no pericardial tamponade, EF up to 45% and valve functioning ok (mildly elevated gradient).  Co-ox has been ok.  Suspect septic shock with rise in WBCs and high procalcitonin though no fever.  ?Right-sided PNA.  Blood cultures no growth so far.  Added empiric antibiotics with  vancomycin/Zosyn => now off vanco, continue Zosyn x 8 days. Hydrocortisone added by CCM.  Now weaned off pressors and procalcitonin coming down.  BP stable, can stop hydrocortisone. 3. Aortic stenosis: Severe AS. 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 McAlhany and [place]. - Bioprosthetic valve stable on post-op echo though mean gradient a bit high at 20 mmHg.  4. AKI on CKD stage III: Baseline creatinine 1.7-1.8. Creatinine starting to trend down. Today, creatinine trending down to 2.5 with resolution of septic shock..    5. Atrial fibrillation: Persistent, with RVR this admission.  He has been in atrial fibrillation at least since 6/17.  No prior DCCV. Not sure how long prior to 6/17 he was in atrial fibrillation.  - Continue amiodarone for rate control, now po.  Eventually would like to cardiovert. - He had developing thrombus on TEE => however, heparin now held with bleeding into his right chest.   - With LAA thrombus despite Eliquis, will use Coumadin for home. Eventual goal 2.5 - 3.0.  INR 1.8 today.  No heparin as chest tube still in and draining.  - Will not be able to do DCCV at this point with atrial thrombus => repeat TEE with possible DCCV in 1 month.  6. Pericardial effusion: Moderate to large, no tamponade.  s/p pericardiocentesis with Dr Clifton James 10/20. Pericardial drain removed 09/05/16.  Repeat echo 10/27 with small effusion, no tamponade.   7. Cirrhosis: Noted on prior abdominal CT, LFTs improved with diuresis.  8. CAD:  LHC with moderate coronary disease, nothing flow-limiting.  - Continue statin and 81 mg aspirin. Can stop ASA when INR therapeutic. 9. Hyponatremia: Fluid restrict. Sodium improving. 10. Hemothorax: On right. Decreased chest tube output, ?out today.  11. Anemia: Hemoglobin higher today, Fe studies looked ok.  Follow closely.   I think he should be ok for 2W circle bed.  Marca Ancona 09/12/2016 8:53 AM

## 2016-09-12 NOTE — Progress Notes (Signed)
7 Days Post-Op Procedure(s) (LRB): TRANSCATHETER AORTIC VALVE REPLACEMENT, TRANSFEMORAL (N/A) TRANSESOPHAGEAL ECHOCARDIOGRAM (TEE) (N/A) CHEST TUBE INSERTION (Right) Subjective:  He feels well. Ambulated 4 laps around the ICU  Remains on Milrinone 0.125. Co-ox at 4:22 am was 51.5 and repeat at 9:30 am was 65.7. CVP 6.  Objective: Vital signs in last 24 hours: Temp:  [97.3 F (36.3 C)-98 F (36.7 C)] 98 F (36.7 C) (10/31 1100) Pulse Rate:  [52-143] 83 (10/31 1300) Cardiac Rhythm: Atrial fibrillation (10/31 0800) Resp:  [0-33] 0 (10/31 1300) BP: (85-155)/(49-76) 98/49 (10/31 1300) SpO2:  [93 %-100 %] 97 % (10/31 1300) Weight:  [84.7 kg (186 lb 11.7 oz)] 84.7 kg (186 lb 11.7 oz) (10/31 0500)  Hemodynamic parameters for last 24 hours: CVP:  [6 mmHg-12 mmHg] 6 mmHg  Intake/Output from previous day: 10/30 0701 - 10/31 0700 In: 634.5 [P.O.:240; I.V.:244.5; IV Piggyback:150] Out: 1995 [Urine:1825; Chest Tube:170] Intake/Output this shift: Total I/O In: 69.2 [I.V.:19.2; IV Piggyback:50] Out: 290 [Urine:200; Chest Tube:90]  General appearance: alert and cooperative Neurologic: intact Heart: irregularly irregular rhythm Lungs: clear to auscultation bilaterally Extremities: extremities normal, atraumatic, no cyanosis or edema Wound: groin access sites ok Chest tube output low last shift and thinner.  Lab Results:  Recent Labs  09/11/16 0350 09/12/16 0420  WBC 12.9* 12.4*  HGB 8.1* 8.3*  HCT 24.2* 24.9*  PLT 109* 144*   BMET:  Recent Labs  09/11/16 0350 09/12/16 0420  NA 126* 131*  K 4.2 4.0  CL 94* 99*  CO2 22 24  GLUCOSE 133* 143*  BUN 43* 47*  CREATININE 2.76* 2.50*  CALCIUM 8.1* 8.2*    PT/INR:  Recent Labs  09/12/16 0420  LABPROT 21.8*  INR 1.87   ABG    Component Value Date/Time   PHART 7.420 09/05/2016 1350   HCO3 28.4 (H) 09/05/2016 1350   TCO2 30 09/05/2016 1350   O2SAT 65.7 09/12/2016 0930   CBG (last 3)  No results for input(s):  GLUCAP in the last 72 hours.  Assessment/Plan: S/P Procedure(s) (LRB): TRANSCATHETER AORTIC VALVE REPLACEMENT, TRANSFEMORAL (N/A) TRANSESOPHAGEAL ECHOCARDIOGRAM (TEE) (N/A) CHEST TUBE INSERTION (Right)  He is hemodynamically stable on milrinone 0.125 with Co-ox 65.7 and low CVP. His BP is only in the 90's and I am concerned that he may get too dry, too quickly. His kidneys are still recovering and will not tolerate another episode of hypotension. Will hold lasix tonight.  Chest tube output is decreased so will remove chest tube and repeat CXR.  Continue coumadin for A-fib but would keep INR around 2.0 for now to minimize the chance or rebleeding into chest.  I think it is fine for him to go to 2W stepdown after chest tube removal.   LOS: 19 days    [name] 09/12/2016

## 2016-09-13 DIAGNOSIS — Z954 Presence of other heart-valve replacement: Secondary | ICD-10-CM

## 2016-09-13 DIAGNOSIS — I350 Nonrheumatic aortic (valve) stenosis: Secondary | ICD-10-CM

## 2016-09-13 LAB — RENAL FUNCTION PANEL
ALBUMIN: 1.9 g/dL — AB (ref 3.5–5.0)
ANION GAP: 7 (ref 5–15)
BUN: 46 mg/dL — ABNORMAL HIGH (ref 6–20)
CALCIUM: 8.2 mg/dL — AB (ref 8.9–10.3)
CO2: 26 mmol/L (ref 22–32)
Chloride: 101 mmol/L (ref 101–111)
Creatinine, Ser: 2.4 mg/dL — ABNORMAL HIGH (ref 0.61–1.24)
GFR, EST AFRICAN AMERICAN: 31 mL/min — AB (ref >60)
GFR, EST NON AFRICAN AMERICAN: 26 mL/min — AB (ref >60)
Glucose, Bld: 108 mg/dL — ABNORMAL HIGH (ref 65–99)
PHOSPHORUS: 2.7 mg/dL (ref 2.5–4.6)
POTASSIUM: 3.7 mmol/L (ref 3.5–5.1)
SODIUM: 134 mmol/L — AB (ref 135–145)

## 2016-09-13 LAB — COOXEMETRY PANEL
CARBOXYHEMOGLOBIN: 1.8 % — AB (ref 0.5–1.5)
METHEMOGLOBIN: 0.5 % (ref 0.0–1.5)
O2 SAT: 61.3 %
Total hemoglobin: 17.7 g/dL — ABNORMAL HIGH (ref 12.0–16.0)

## 2016-09-13 LAB — CULTURE, BLOOD (ROUTINE X 2)
CULTURE: NO GROWTH
Culture: NO GROWTH

## 2016-09-13 LAB — CBC WITH DIFFERENTIAL/PLATELET
BASOS ABS: 0 10*3/uL (ref 0.0–0.1)
BASOS PCT: 0 %
Eosinophils Absolute: 0.2 10*3/uL (ref 0.0–0.7)
Eosinophils Relative: 1 %
HEMATOCRIT: 25.4 % — AB (ref 39.0–52.0)
HEMOGLOBIN: 8.3 g/dL — AB (ref 13.0–17.0)
Lymphocytes Relative: 5 %
Lymphs Abs: 0.6 10*3/uL — ABNORMAL LOW (ref 0.7–4.0)
MCH: 26.7 pg (ref 26.0–34.0)
MCHC: 32.7 g/dL (ref 30.0–36.0)
MCV: 81.7 fL (ref 78.0–100.0)
MONO ABS: 2.3 10*3/uL — AB (ref 0.1–1.0)
Monocytes Relative: 20 %
NEUTROS ABS: 8.4 10*3/uL — AB (ref 1.7–7.7)
NEUTROS PCT: 74 %
Platelets: 177 10*3/uL (ref 150–400)
RBC: 3.11 MIL/uL — AB (ref 4.22–5.81)
RDW: 19.2 % — AB (ref 11.5–15.5)
WBC: 11.5 10*3/uL — AB (ref 4.0–10.5)

## 2016-09-13 LAB — PROTIME-INR
INR: 3.6
INR: 3.44
Prothrombin Time: 36.7 seconds — ABNORMAL HIGH (ref 11.4–15.2)
Prothrombin Time: 35.5 seconds — ABNORMAL HIGH (ref 11.4–15.2)

## 2016-09-13 MED ORDER — FAMOTIDINE 20 MG PO TABS
10.0000 mg | ORAL_TABLET | Freq: Two times a day (BID) | ORAL | Status: DC | PRN
  Administered 2016-09-13 – 2016-09-16 (×7): 10 mg via ORAL
  Filled 2016-09-13 (×7): qty 1

## 2016-09-13 MED ORDER — FUROSEMIDE 40 MG PO TABS
40.0000 mg | ORAL_TABLET | Freq: Every day | ORAL | Status: DC
  Administered 2016-09-13 – 2016-09-15 (×3): 40 mg via ORAL
  Filled 2016-09-13 (×4): qty 1

## 2016-09-13 NOTE — Progress Notes (Signed)
8 Days Post-Op Procedure(s) (LRB): TRANSCATHETER AORTIC VALVE REPLACEMENT, TRANSFEMORAL (N/A) TRANSESOPHAGEAL ECHOCARDIOGRAM (TEE) (N/A) CHEST TUBE INSERTION (Right) Subjective:  Feeling well. Ambulating well without shortness of breath or fatigue.  Objective: Vital signs in last 24 hours: Temp:  [97.6 F (36.4 C)-98.7 F (37.1 C)] 98.7 F (37.1 C) (11/01 1100) Pulse Rate:  [52-114] 52 (11/01 1400) Cardiac Rhythm: Atrial fibrillation;Bundle branch block (11/01 1200) Resp:  [0-33] 7 (11/01 1400) BP: (91-140)/(56-74) 117/69 (11/01 1400) SpO2:  [93 %-100 %] 97 % (11/01 1400) Weight:  [186 lb 15.2 oz (84.8 kg)] 186 lb 15.2 oz (84.8 kg) (11/01 0500)  Hemodynamic parameters for last 24 hours: CVP:  [6 mmHg-12 mmHg] 12 mmHg  Intake/Output from previous day: 10/31 0701 - 11/01 0700 In: 789.8 [P.O.:240; I.V.:399.8; IV Piggyback:150] Out: 1240 [Urine:1150; Chest Tube:90] Intake/Output this shift: Total I/O In: 380 [P.O.:300; I.V.:30; IV Piggyback:50] Out: 425 [Urine:425]  General appearance: alert and cooperative Neurologic: intact Heart: irregular rate and rhythm, S1, S2 normal, no murmur, click, rub or gallop Lungs: diminished breath sounds RLL Extremities: extremities normal, atraumatic, no cyanosis or edema  Lab Results:  Recent Labs  09/12/16 0420 09/13/16 0310  WBC 12.4* 11.5*  HGB 8.3* 8.3*  HCT 24.9* 25.4*  PLT 144* 177   BMET:  Recent Labs  09/12/16 0420 09/13/16 0310  NA 131* 134*  K 4.0 3.7  CL 99* 101  CO2 24 26  GLUCOSE 143* 108*  BUN 47* 46*  CREATININE 2.50* 2.40*  CALCIUM 8.2* 8.2*    PT/INR:  Recent Labs  09/13/16 0807  LABPROT 35.5*  INR 3.44   ABG    Component Value Date/Time   PHART 7.420 09/05/2016 1350   HCO3 28.4 (H) 09/05/2016 1350   TCO2 30 09/05/2016 1350   O2SAT 61.3 09/13/2016 0328   CBG (last 3)  No results for input(s): GLUCAP in the last 72 hours.  Assessment/Plan: S/P Procedure(s) (LRB): TRANSCATHETER AORTIC  VALVE REPLACEMENT, TRANSFEMORAL (N/A) TRANSESOPHAGEAL ECHOCARDIOGRAM (TEE) (N/A) CHEST TUBE INSERTION (Right)  He is hemodynamically stable with Co-ox 61.3 this am on Milrinone 0.125. Plan to stop it and observe. CXR looks ok with small residual right effusion, most likely some clot at the right base. INR jumped up overnight. Wants to keep around 2-2.5 to prevent more bleeding into chest.   LOS: 20 days    [name] 09/13/2016

## 2016-09-13 NOTE — Progress Notes (Signed)
Patient ID: Lucas Munoz, male   DOB: 1951/10/21, 65 y.o.   MRN: [id number]    SUBJECTIVE:   PICC placed , initial co-ox on 10/14 47%.  He was started on milrinone. Milrinone increased to 0.375 mcg 08/29/16 for low mixed venous sat on RHC (CI 1.9).  Underwent pericardiocentesis on 10/20 with 775cc out. Fluid transudative. Drained another 225 cc yesterday  TEE 10/23 confirmed amorphous material in LA appendage, appears to be forming thrombus.  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   Post-TAVR echo 10/25 with EF up to 45%, moderate LVH, bioprosthetic AVR with mean gradient 20 (higher than expected), PASP 37, normal RV, no pericardial effusion.   On 10/26 developed nausea/vomiting and hypotension.  He was started on norepinephrine and IV fluid with low CVP. CXR showed large right pleural effusion accumulation. Chest tube replaced on right, 1300 cc bloody fluid out.  Heparin stopped. He remained hypotensive.  1 unit PRBCs given. Also noted rise in creatinine with poor UOP.  Echo 10/26 with EF about 45%, small pericardial effusion with no tamponade. PCT up to 4.63, possible HCAP.  Suspect septic shock => resolved.   Doing better today.  Off pressors now.  Creatinine 3.71 => 3.22 => 2.94=>2.76 => 2.5 => 2.4.  CVP 9. Today CO-OX is 61%.     Chest tube removed 10/31.    Denies SOB. Feels good. Angry because he could not get Pepcid last night.    RHC/LHC (08/29/16) Left Main  Mild ostial narrowing.  Left Anterior Descending  30% proximal LAD stenosis. 60% distal LAD stenosis. Moderate D1 without significant disease. Small to moderate D2 with long 70% ostial stenosis.  Left Circumflex  50% proximal LCx stenosis at OM1. Moderate OM1 with 30% ostial stenosis.  Right Coronary Artery  40% proximal and 40% mid RCA stenosis.   RA mean 19 RV 65/15 PA 69/34, mean 51 PCWP mean 43 (not sure we got an accurate PCWP).  AO 106/87 Oxygen saturations: PA 46% AO  92% Cardiac Output (Fick) 3.8  Cardiac Index (Fick) 1.9  Echo (10/14): EF 15%, severe AS, moderately decreased RV systolic function, PASP 65 mmHg, moderate to large pericardial effusion without tamponade.   Scheduled Meds: . sodium chloride   Intravenous Once  . sodium chloride   Intravenous Once  . acetaminophen (TYLENOL) oral liquid 160 mg/5 mL  650 mg Per Tube Once   Or  . acetaminophen  650 mg Rectal Once  . amiodarone  200 mg Oral BID  . aspirin  81 mg Oral Daily  . atorvastatin  40 mg Oral q1800  . furosemide  40 mg Oral Daily  . mouth rinse  15 mL Mouth Rinse BID  . piperacillin-tazobactam (ZOSYN)  IV  3.375 g Intravenous Q8H  . polyethylene glycol  17 g Oral Daily  . sodium chloride flush  10-40 mL Intracatheter Q12H  . sodium chloride flush  3 mL Intravenous Q12H  . Warfarin - Pharmacist Dosing Inpatient   Does not apply q1800   Continuous Infusions:   PRN Meds:.sodium chloride, Place/Maintain arterial line **AND** sodium chloride, acetaminophen, alum & mag hydroxide-simeth, docusate sodium, famotidine, lactulose, morphine injection, ondansetron (ZOFRAN) IV, oxyCODONE, oxyCODONE-acetaminophen, sodium chloride flush, sodium chloride flush, traMADol, traZODone, white petrolatum   Vitals:   09/13/16 0400 09/13/16 0500 09/13/16 0600 09/13/16 0700  BP: 107/69 96/62 (!) 97/59 125/70  Pulse: 100 74 88 69  Resp: 11 14 (!) 0 (!) 33  Temp: 98.4 F (36.9  C)     TempSrc: Oral     SpO2: 99% 95% 94% 98%  Weight:  186 lb 15.2 oz (84.8 kg)    Height:  5\' 8"  (1.727 m)      Intake/Output Summary (Last 24 hours) at 09/13/16 0811 Last data filed at 09/13/16 0700  Gross per 24 hour  Intake            780.2 ml  Output             1160 ml  Net           -379.8 ml    LABS: Basic Metabolic Panel:  Recent Labs  16/10/96 0420 09/13/16 0310  NA 131* 134*  K 4.0 3.7  CL 99* 101  CO2 24 26  GLUCOSE 143* 108*  BUN 47* 46*  CREATININE 2.50* 2.40*  CALCIUM 8.2* 8.2*  PHOS 3.7  2.7   Liver Function Tests:  Recent Labs  09/12/16 0420 09/13/16 0310  ALBUMIN 2.0* 1.9*   No results for input(s): LIPASE, AMYLASE in the last 72 hours. CBC:  Recent Labs  09/12/16 0420 09/13/16 0310  WBC 12.4* 11.5*  NEUTROABS 10.9* 8.4*  HGB 8.3* 8.3*  HCT 24.9* 25.4*  MCV 82.5 81.7  PLT 144* 177   Cardiac Enzymes: No results for input(s): CKTOTAL, CKMB, CKMBINDEX, TROPONINI in the last 72 hours. BNP: Invalid input(s): POCBNP D-Dimer: No results for input(s): DDIMER in the last 72 hours. Hemoglobin A1C: No results for input(s): HGBA1C in the last 72 hours. Fasting Lipid Panel: No results for input(s): CHOL, HDL, LDLCALC, TRIG, CHOLHDL, LDLDIRECT in the last 72 hours. Thyroid Function Tests: No results for input(s): TSH, T4TOTAL, T3FREE, THYROIDAB in the last 72 hours.  Invalid input(s): FREET3 Anemia Panel:  Recent Labs  09/10/16 1030  FERRITIN 476*  TIBC 353  IRON 67    RADIOLOGY: US Renal  Result Date: 09/08/2016 CLINICAL DATA:  Acute renal failure EXAM: RENAL / URINARY TRACT ULTRASOUND COMPLETE COMPARISON:  CT abdomen pelvis 04/11/2016 FINDINGS: Right Kidney: Length: 10.8 cm. Negative for obstruction or mass. Limited evaluation the right kidney due to chest tube and bandages. Left Kidney: Length: 12.2 cm. Echogenicity within normal limits. No mass or hydronephrosis visualized. Bladder: Appears normal for degree of bladder distention. IMPRESSION: Negative renal ultrasound. Electronically Signed   By: Marlan Palau M.D.   On: 09/08/2016 16:09   Ct Cardiac Morph/pulm Vein W/cm&w/o Ca Score  Addendum Date: 09/01/2016   ADDENDUM REPORT: 09/01/2016 17:18 CLINICAL DATA:  Aortic stenosis EXAM: Cardiac TAVR CT TECHNIQUE: The patient was scanned on a Philips 256 scanner. A 120 kV retrospective scan was triggered in the descending thoracic aorta at 111 HU's. Gantry rotation speed was 270 msecs and collimation was .9 mm. No beta blockade or nitro were given. The 3D  data set was reconstructed in 5% intervals of the R-R cycle. Systolic and diastolic phases were analyzed on a dedicated work station using MPR, MIP and VRT modes. The patient received 80 cc of contrast. 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 Heavily calcified Aorta:  Aortic root dilated Sinotubular Junction:  32 mm Ascending Thoracic Aorta:  43 mm Aortic Arch:  30 mm Descending Thoracic Aorta:  26 mm Sinus of Valsalva Measurements: Non-coronary:  33 mm Right -coronary:  31 mm Left -coronary:  32 mm Coronary Artery Height above Annulus: Left Main:  19 mm above annulus Right Coronary:  10 mm above annulus Virtual Basal Annulus Measurements: Maximum/Minimum Diameter:  27.1 x 20.3 mm Perimeter:  78 mm Area:  442 mm2 Coronary Arteries:  Sufficient height above annulus for deployment Optimum Fluoroscopic Angle for Delivery:  LAO 29 degrees IMPRESSION: 1) functionally bicuspid AV heavily calcified with annulus suitable for 26 mm Sapien 3 valve 2) Moderate aortic root enlargement 4.3 cm 3) Large pericardial effusion patient to have pericardiocentesis post CT 4) Cannot r/o LAA thrombus suggest TEE correlation 5) Optimum angiographic angle for deployment LAO 29 degrees 6) Coronary heights sufficient for deployment Charlton HawsPeter Nishan Electronically Signed   By: Charlton HawsPeter  Nishan M.D.   On: 09/01/2016 17:18   Result Date: 09/01/2016 EXAM: OVER-READ INTERPRETATION  CT CHEST The following report is an over-read performed by radiologist Dr. Royal Piedraaniel Entrikinof [facility] on 09/01/2016. This over-read does not include interpretation of cardiac or coronary anatomy or pathology. The coronary calcium score/coronary CTA interpretation by the cardiologist is attached. COMPARISON:  Chest CT 05/01/2016. FINDINGS: Large pericardial effusion. No pericardial calcification. Aortic atherosclerosis with aneurysmal dilatation of the ascending thoracic aorta which measures up to 4.8 cm in diameter  (unchanged). Large right-sided pleural effusion layering dependently. Extensive passive atelectasis in the right lower lobe. Tiny calcified granuloma in the periphery of the right lower lobe. 4 mm subpleural noncalcified pulmonary nodule in the left lower lobe abutting the major fissure (image 71 of series 512), unchanged compared to prior study 05/01/2016, favored to represent a subpleural lymph node. No other suspicious appearing pulmonary nodules or masses are noted in the visualized portions of the thorax. Partially calcified pleural plaque in the right hemithorax (image 31 of series 513) again noted. No calcified pleural plaques in the left hemithorax. Visualized portions of the upper abdomen demonstrate a nodular contour of the liver, suggestive of underlying cirrhosis. No aggressive appearing lytic or blastic lesions are noted in the visualized portions of the skeleton. Right upper extremity PICC with tip terminating in the distal superior vena cava. IMPRESSION: 1. Interval increased size of what is now a large pericardial effusion. No associated pericardial calcification. 2. Enlargement of a large layering right-sided pleural effusion. This is associated with extensive passive atelectasis in the right lower lobe. 3. Aortic atherosclerosis, with similar aneurysmal dilatation of the ascending thoracic aorta which measures up to 4.8 cm in diameter. 4. Morphologic changes in the liver indicative of cirrhosis, as above. 5. 4 mm subpleural nodule in the periphery of the left lower lobe is nonspecific, but statistically likely a subpleural lymph node. No follow-up needed if patient is low-risk. Non-contrast chest CT can be considered in 12 months if patient is high-risk. This recommendation follows the consensus statement: Guidelines for Management of Incidental Pulmonary Nodules Detected on CT Images: From the Fleischner Society 2017; Radiology 2017; 284:228-243. 6. Additional incidental findings, as above.  Electronically Signed: By: Trudie Reedaniel  Entrikin M.D. On: 09/01/2016 14:38   Dg Chest Port 1 View  Result Date: 09/12/2016 CLINICAL DATA:  Right chest tube removal EXAM: PORTABLE CHEST 1 VIEW COMPARISON:  09/11/2016 FINDINGS: Right chest tube has been removed. There is small right pleural effusion with right basilar atelectasis or infiltrate. No pneumothorax. Right arm PICC line with tip in SVC right atrium junction. IMPRESSION: Small right pleural effusion with right basilar atelectasis or infiltrate. Stable right arm PICC line position. No pneumothorax. Electronically Signed   By: Natasha MeadLiviu  Pop M.D.   On: 09/12/2016 14:55   Dg Chest Port 1 View  Result Date: 09/11/2016 CLINICAL DATA:  Respiratory failure, shortness of breath, status post aortic valve replacement on October24,  2017. EXAM: PORTABLE CHEST 1 VIEW COMPARISON:  Portable chest x-rays of September 05, 2016 and September 10, 2016. FINDINGS: The left lung is well-expanded and clear. On the right no pneumothorax is evident. There is a small right pleural effusion. The interstitial markings remain prominent especially at the lung base. The right-sided chest tube tip projects over the inferior margin of the posterior aspect of the right sixth rib. The cardiac silhouette remains enlarged. The pulmonary vascularity is less engorged. There is calcification in the wall of the aortic arch. There degenerative changes of the shoulders and there is an old healed midshaft right clavicular fracture. The right-sided PICC line tip projects over the midportion of the SVC. IMPRESSION: Mild improvement in the pulmonary interstitium overall consistent with decreasing interstitial edema. Persistent small right pleural effusion. The right-sided chest tube is in stable position. Electronically Signed   By: David  [place] M.D.   On: 09/11/2016 07:40   Dg Chest Port 1 View  Result Date: 09/10/2016 CLINICAL DATA:  Chest congestion.  Cough. EXAM: PORTABLE CHEST 1 VIEW COMPARISON:   Yesterday. FINDINGS: Stable enlarged cardiac silhouette and transcatheter aortic valve. The right PICC is unchanged. A right chest tube remains in place with no pneumothorax. The pulmonary vasculature and interstitial markings remain prominent. Thoracic spine degenerative changes. IMPRESSION: Stable cardiomegaly and changes of congestive heart failure. Electronically Signed   By: Beckie Salts M.D.   On: 09/10/2016 08:14   Dg Chest Port 1 View  Result Date: 09/09/2016 CLINICAL DATA:  Status post TAVR and right chest tube placement for hemothorax. EXAM: PORTABLE CHEST 1 VIEW COMPARISON:  09/08/2016 FINDINGS: Right chest tube remains in place with no significant pleural fluid volume. No pneumothorax. There remains atelectasis in the right lower lung. PICC line positioning stable in the distal SVC. Stable cardiac enlargement and appearance of transcatheter aortic valve. Suspect mild residual interstitial edema which appears stable. IMPRESSION: No significant residual right pleural fluid volume with atelectasis remaining in the right lower lung. Stable mild interstitial edema. Electronically Signed   By: Irish Lack M.D.   On: 09/09/2016 09:13   Dg Chest Port 1 View  Result Date: 09/08/2016 CLINICAL DATA:  Shortness of breath.  CHF. EXAM: PORTABLE CHEST 1 VIEW COMPARISON:  09/07/2016. FINDINGS: Right PICC line and right chest tube in stable position. No pneumothorax. Right pleural effusion is improved slightly. Cardiomegaly with diffuse pulmonary interstitial prominence mild congestive heart failure cannot be excluded . IMPRESSION: 1. Right PICC line right chest tube in stable position. No pneumothorax. Right pleural effusion has improved slightly. 2. With bilateral from interstitial prominence. Mild congestive heart failure cannot be excluded . Electronically Signed   By: Maisie Fus  Register   On: 09/08/2016 08:48   Dg Chest Port 1 View  Result Date: 09/07/2016 CLINICAL DATA:  Hemothorax with new right  chest tube. EXAM: PORTABLE CHEST 1 VIEW COMPARISON:  Earlier today FINDINGS: Decreased but still extensive right hemothorax after chest tube placement. No visualized gas component. Right upper extremity PICC with tip at the SVC level. Unchanged cardiopericardial enlargement. Status post transcatheter aortic valve replacement. IMPRESSION: 1. Right chest tube placement with diminished hemothorax. 2. Elsewhere stable chest. Electronically Signed   By: Marnee Spring M.D.   On: 09/07/2016 09:05   Dg Chest Port 1 View  Result Date: 09/07/2016 CLINICAL DATA:  Vomiting EXAM: PORTABLE CHEST 1 VIEW COMPARISON:  Yesterday FINDINGS: Stable right upper extremity PICC. Swan-Ganz catheter and right jugular introducer have been removed. Right chest tube is been removed.  A large right pleural effusion has accumulated. There is compressive atelectasis within much of the right lung. Vascular congestion. Left lung is otherwise clear. Cardiomegaly. Small right pneumothorax is suspected. IMPRESSION: Right chest tube removed. Large right pleural effusion has reaccumulated. Very small right apical pneumothorax has also developed. Electronically Signed   By: Jolaine Click M.D.   On: 09/07/2016 07:39   Dg Chest Port 1 View  Result Date: 09/06/2016 CLINICAL DATA:  Cardiac surgery EXAM: PORTABLE CHEST 1 VIEW COMPARISON:  Yesterday FINDINGS: Right upper extremity PICC, right chest tube, right jugular introducer, right Swan-Ganz catheter are stable. There is no pneumothorax. Small right pleural effusion is stable. Vascular congestion and mild edema are worse. Lower lung volumes. Cardiac silhouette remains markedly enlarged. IMPRESSION: Worsening vascular congestion and edema. No pneumothorax. Electronically Signed   By: Jolaine Click M.D.   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 Postoperative image. EXAM: PORTABLE CHEST 1 VIEW COMPARISON:  CT  chest 09/01/2016 and single view of the chest 08/26/2016. FINDINGS: Endotracheal tube is in place with tip in good position at the level of the clavicular heads. Right IJ approach Swan-Ganz catheter tip is in the proximal right main pulmonary artery. Tip of right PICC projects in the lower superior vena cava. NG tube courses into the stomach. Right chest tube is identified. There is a small right pleural effusion and basilar atelectasis, improved since the prior chest film. The lungs are otherwise clear. The cardiopericardial silhouette is enlarged. No pneumothorax. IMPRESSION: Support tubes and lines projecting good position. Negative for pneumothorax. Decreased small right pleural effusion and basilar atelectasis. Enlarged cardiopericardial silhouette consistent with cardiomegaly and pericardial effusion as seen on CT scan. Electronically Signed   By: Drusilla Kanner M.D.   On: 09/05/2016 13:56   Dg Chest Port 1 View  Result Date: 08/26/2016 CLINICAL DATA:  65 year old male with a history of shortness of breath EXAM: PORTABLE CHEST 1 VIEW COMPARISON:  08/25/2016, CT chest 05/01/2016 FINDINGS: Compare to the prior plain film there is unchanged size of the cardiac silhouette. Calcifications of the aortic arch. Interval placement of right upper extremity PICC with the tip appearing to terminate at the superior cavoatrial junction. Increasing opacity at the right base with thickening of the minor fissure, obscuration the right hemidiaphragm and right heart border. Interlobular septal thickening. IMPRESSION: Evidence of CHF with increasing right-sided pleural effusion/atelectasis. Unchanged cardiac silhouette in this patient with a history of pericardial effusion imaged on prior CT 05/01/2016. Interval placement of right upper extremity PICC. Aortic atherosclerosis. Signed, Yvone Neu. Loreta Ave, DO [facility] Electronically Signed   By: Gilmer Mor D.O.    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 Dilated cardiomyopathy. Acute kidney injury. EXAM: PORTABLE CHEST 1 VIEW COMPARISON:  None. FINDINGS: Marked cardiac enlargement noted. Diffuse interstitial infiltrates consistent with mild interstitial edema. Small right pleural effusion also seen with right basilar atelectasis. IMPRESSION: Mild congestive heart failure, with small right pleural effusion and right basilar atelectasis. Electronically Signed   By: Myles Rosenthal M.D.   On: 08/25/2016 15:14   Ct Angio Chest/abd/pel For Dissection W And/or W/wo  Result Date: 09/03/2016 CLINICAL DATA:  65 year old male with history of severe aortic stenosis. Preprocedural study prior to potential transcatheter aortic valve replacement. Recent history of pericardiocentesis. EXAM:  CT ANGIOGRAPHY CHEST, ABDOMEN AND PELVIS TECHNIQUE: Multidetector CT imaging through the chest, abdomen and pelvis was performed using the standard protocol during bolus administration of intravenous contrast. Multiplanar reconstructed images and MIPs were obtained and reviewed to evaluate the vascular anatomy. CONTRAST:  80 mL of Isovue 370. COMPARISON:  Cardiac gated CTA 09/01/2016. FINDINGS: CTA CHEST FINDINGS Cardiovascular: Heart size is mildly enlarged. Small amount of residual pericardial fluid and/or thickening, decreased compared to the prior study. Pericardiocentesis catheter in place with tip terminating high in a superior pericardial recess adjacent to the aortic arch. No pericardial calcification. Severe thickening calcification of the aortic valve, which has an appearance suggestive of a bicuspid valve. There is aortic atherosclerosis, as well as atherosclerosis of the great vessels of the mediastinum and the coronary arteries, including calcified atherosclerotic plaque in the left anterior descending and right coronary arteries. Ascending  thoracic aortic aneurysm measuring 4.8 cm in diameter is unchanged. Large filling defect within the left atrial appendage, concerning for potential left atrial appendage thrombus. Right upper extremity PICC with tip terminating at the superior cavoatrial junction. Mediastinum/Lymph Nodes: Several borderline enlarged and minimally enlarged mediastinal lymph nodes measuring up to 14 mm in short axis, slightly increased compared to the prior study, likely reactive. Esophagus is unremarkable in appearance. No axillary lymphadenopathy. Lungs/Pleura: Large right pleural effusion again noted, lying dependently. This is associated with near complete passive atelectasis of the right lower lobe. Trace left pleural effusion with minimal passive subsegmental atelectasis in the dependent left lower lobe. No acute consolidative airspace disease. Previously noted tiny subpleural known nodule in the anterior aspect of the left lower lobe is no longer noted, presumably a reactive subpleural lymph node on the prior examination. Partially calcified small right-sided pleural plaque is unchanged (image 63 of series 5). No other calcified pleural plaques are identified. Tiny calcified granulomas are again noted in the periphery of the right lower lobe, now with an atelectatic lung. Musculoskeletal/Soft Tissues: There are no aggressive appearing lytic or blastic lesions noted in the visualized portions of the skeleton. CTA ABDOMEN AND PELVIS FINDINGS Hepatobiliary: Liver has a shrunken appearance and nodular contour, compatible with underlying cirrhosis. No discrete cystic or solid hepatic lesions. No intra or extrahepatic biliary ductal dilatation. Large rim calcified gallstone in the gallbladder measuring up to 3.8 cm in diameter. No current findings to suggest an acute cholecystitis at this time. Pancreas: No pancreatic mass or peripancreatic inflammatory changes. No pancreatic ductal dilatation. Pancreatic atrophy. No pancreatic or  peripancreatic fluid. Spleen: Unremarkable. Adrenals/Urinary Tract: Bilateral kidneys and bilateral adrenal glands are unremarkable in appearance. No hydroureteronephrosis. In the dependent portion of the urinary bladder there is a 12 mm calculus (image 254 of series 5). Urinary bladder is otherwise unremarkable in appearance. Stomach/Bowel: Normal appearance of the stomach. No pathologic dilatation of small bowel or colon. Numerous colonic diverticulae are noted, without surrounding inflammatory changes to suggest an acute diverticulitis at this time. The appendix is not confidently identified and may be surgically absent. Regardless, there are no inflammatory changes noted adjacent to the cecum to suggest the presence of an acute appendicitis at this time. Vascular/Lymphatic: Aortic atherosclerosis with vascular findings and measurements pertinent to potential TAVR procedure, as detailed below. No aneurysm or dissection identified in the abdominal or pelvic vasculature. Celiac axis, superior mesenteric artery and inferior mesenteric artery are all widely patent without hemodynamically significant stenosis. Single renal arteries bilaterally are widely patent. Portal vein is mildly dilated measuring 16 mm in the porta hepatis. Reproductive: Prostate  gland and seminal vesicles are unremarkable in appearance. Other: Small left inguinal hernia containing a short segment of small bowel. No significant volume of ascites. No pneumoperitoneum. Musculoskeletal: There are no aggressive appearing lytic or blastic lesions noted in the visualized portions of the skeleton. VASCULAR MEASUREMENTS PERTINENT TO TAVR: AORTA: Minimal Aortic Diameter -  18 x 14 mm Severity of Aortic Calcification -  moderate RIGHT PELVIS: Right Common Iliac Artery - Minimal Diameter - 11.0 x 9.1 mm Tortuosity - moderate Calcification - mild to moderate Right External Iliac Artery - Minimal Diameter - 8.7 x 8.9 mm Tortuosity - moderate to severe  Calcification - none Right Common Femoral Artery - Minimal Diameter - 9.2 x 6.8 mm Tortuosity - mild Calcification - mild LEFT PELVIS: Left Common Iliac Artery - Minimal Diameter - 9.5 x 10.0 mm Tortuosity - severe Calcification - none Left External Iliac Artery - Minimal Diameter - 8.1 x 7.9 mm Tortuosity - moderate Calcification - none Left Common Femoral Artery - Minimal Diameter - 8.5 x 6.3 mm Tortuosity - mild Calcification - mild Review of the MIP images confirms the above findings. IMPRESSION: 1. Vascular findings and measurements pertinent to potential TAVR procedure, as detailed above. This patient appears to have suitable pelvic arterial access, however, this access is most optimal on the right side secondary to the lesser degree of tortuosity. Additionally, there is a small left inguinal hernia containing a short segment of small bowel which comes in close proximity to the left common femoral artery. 2. Severe thickening calcification of what appears to be a bicuspid aortic valve, compatible with the reported clinical history of severe aortic stenosis. 3. Interval pericardiocentesis with persistent indwelling pericardiocentesis catheter, with significant decreased size of what is now only a small amount of residual pericardial fluid and/or thickening. 4. Large filling defect in the tip of the left atrial appendage, concerning for potential left atrial appendage thrombus (although this could simply reflect pseudo thrombus secondary to contrast bolus timing). Correlation with TEE is recommended, if not already performed. 5. Aortic atherosclerosis, in addition to 2 vessel coronary artery disease. Please note that although the presence of coronary artery calcium documents the presence of coronary artery disease, the severity of this disease and any potential stenosis cannot be assessed on this non-gated CT examination. Assessment for potential risk factor modification, dietary therapy or pharmacologic therapy  may be warranted, if clinically indicated. 6. Persistent large right-sided pleural effusion with extensive passive atelectasis in the right lower lobe. Trace left pleural effusion with minimal dependent passive atelectasis in the left lower lobe. 7. Cholelithiasis without evidence of acute cholecystitis. 8. Colonic diverticulosis without evidence of acute diverticulitis at this time. 9. Morphologic changes in the liver compatible with underlying cirrhosis. 10. Additional incidental findings, as above. Electronically Signed   By: Trudie Reed M.D.   On: 09/03/2016 12:24    PHYSICAL EXAM CVP 6 General: NAD. sitting in the chair.  Neck: JVP 8-9 Lungs: Decreased breath sounds on right.  CV: Nondisplaced PMI.  Heart irregular S1/S2, no S3/S4, 2/6 SEM RUSB.  No edema.  Abdomen: Soft, NT, ND, no HSM. No bruits or masses. +BS  Neurologic: Alert and oriented x 3.  Psych: Normal affect. Extremities: No clubbing or cyanosis. RUE PICC  TELEMETRY: Reviewed, A fib 80s-90s  ASSESSMENT AND PLAN: 65 y/o ?with a h/o severe AS, LV dysfxn, HTN, noncompliance, thyroid nodules, pericardial effusion, cholelithiasis, and persistent AF on eliquis, who presentedto clinic on 08/23/16 with volume overload and was admitted for  further work up of AS and CHF.  1. Acute on chronic systolic CHF: EF 40-98% in setting of severe aortic stenosis.  May be due to severe aortic stenosis given lack of flow-limiting CAD.  This admission, he was initially hypotensive with SBP in 80s though BP has improved to 100s-110s range.  He was also markedly volume overloaded. PICC was placed, low output confirmed by co-ox 47%.  Milrinone 0.25 started then increased to 0.375, now down to 0.25.  Repeat echo post-TAVR with EF up to 45%.  Repeat limited echo 10/26 with small pericardial effusion, no tamponade.  Developed suspected septic shock most recently, BP better now and off pressors. Today, CVP 6  with co-ox 61%.  BP stable.  - Transition to  Lasix 40 mg daily.    - Stopped spironolactone, digoxin, and lisinopril with AKI.  - Stop milrinone today, repeat co-ox.     - Wear Ted hose.  2. Hypotension:  Pleural effusion drained, no pericardial tamponade, EF up to 45% and valve functioning ok (mildly elevated gradient).  Co-ox has been ok.  Suspect septic shock with rise in WBCs and high procalcitonin though no fever.  ?Right-sided PNA.  Blood cultures no growth so far.  Added empiric antibiotics with vancomycin/Zosyn => now off vanco, continue Zosyn x 8 days. Hydrocortisone added by CCM.  Now weaned off pressors and procalcitonin coming down.   3. Aortic stenosis: Severe AS. 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 - Bioprosthetic valve stable on post-op echo though mean gradient a bit high at 20 mmHg.  4. AKI on CKD stage III: Baseline creatinine 1.7-1.8. Creatinine starting to trend down. Today, creatinine trending down to 2.4 with resolution of septic shock..    5. Atrial fibrillation: Persistent, with RVR this admission.  He has been in atrial fibrillation at least since 6/17.  No prior DCCV. Not sure how long prior to 6/17 he was in atrial fibrillation.  - Continue amiodarone for rate control, now po.  Eventually would like to cardiovert. - He had developing thrombus on TEE => heparin held with bleeding into his right chest.   - With LAA thrombus despite Eliquis, will use Coumadin for home. Goal INR 2-3, supratherapeutic today.  Repeat INR because of large jump and hold coumadin. - Will not be able to do DCCV at this point with atrial thrombus => repeat TEE with possible DCCV in 1 month.  6. Pericardial effusion: Moderate to large, no tamponade.  s/p pericardiocentesis with Dr Clifton James 10/20. Pericardial drain removed 09/05/16.  Repeat echo 10/27 with small effusion, no tamponade.   7. Cirrhosis: Noted on prior abdominal CT, LFTs improved with diuresis.  8. CAD:  LHC with moderate coronary disease, nothing flow-limiting.  - Stop  ASA with therapeutic INR.  9. Hyponatremia: Fluid restrict. Sodium improving. 10. Hemothorax: On right. Chest tube now out.  11. Anemia: Hemoglobin stable, Fe studies looked ok.  Follow closely.   I think he should be ok for telemetry.   Marca Ancona 09/13/2016 8:11 AM

## 2016-09-13 NOTE — Progress Notes (Signed)
Attempted to call report; RN unavailable at this time; will call back at a later time.  Lucas Munoz

## 2016-09-13 NOTE — Progress Notes (Signed)
Pt ambulated entire distance from 2S09 to 2W38; pt tolerated ambulation well; pt assisted to bed once transfer complete; RN at bedside upon transfer.  Lucas Munoz

## 2016-09-13 NOTE — Progress Notes (Signed)
ANTICOAGULATION CONSULT NOTE - Follow Up Consult  Pharmacy Consult for warfarin Indication: atrial fibrillation and atrial thrombus  Allergies  Allergen Reactions  . Potassium-Containing Compounds Other (See Comments)    Upset stomach    Patient Measurements: Height: 5\' 8"  (172.7 cm) Weight: 186 lb 15.2 oz (84.8 kg) IBW/kg (Calculated) : 68.4  Vital Signs: Temp: 98.7 F (37.1 C) (11/01 1100) Temp Source: Oral (11/01 0400) BP: 110/74 (11/01 1200) Pulse Rate: 109 (11/01 1200)  Labs:  Recent Labs  09/11/16 0350 09/12/16 0420 09/13/16 0310 09/13/16 0807  HGB 8.1* 8.3* 8.3*  --   HCT 24.2* 24.9* 25.4*  --   PLT 109* 144* 177  --   LABPROT 16.3* 21.8* 36.7* 35.5*  INR 1.31 1.87 3.60 3.44  CREATININE 2.76* 2.50* 2.40*  --     Estimated Creatinine Clearance: 31.7 mL/min (by C-G formula based on SCr of 2.4 mg/dL (H)).  Assessment: 65yom on eliquis pta for afib, transitioned to heparin for pericardiocentesis on 10/20 with drain placement. Drain removed 10/24 w/high risk TAVR same day. Patient initially started on heparin bridge to warfarin post procedure for history of afib and developing atrial thrombus that was seen on TEE, however anticoagulation held with bleeding into his right chest. Warfarin restarted without heparin 10/29 as chest tube still in and draining. Patient will need warfarin instead of eliquis for treatment of LAA thrombus. INR 1.52 > 1.31 > 1.87 then big jump to 3.6. Rechecked INR and it was 3.4. Dose was actually decreased yesterday. Will hold tonight and assess INR in the am. INR goal was initally 2.5 - 3.5 however, per Dr. Shirlee Latch INR goal decreased to 2-3.  Goal of Therapy:  Heparin level 0.3-0.7 units/ml  INR 2-3 Monitor platelets by anticoagulation protocol: Yes   Plan:  1) Hold warfarin tonight 2) Daily INR, CBC 3) Warfarin education when out of ICU  Sherron Monday, PharmD Clinical Pharmacy Resident Pager: [phone] 09/13/16 1:34 PM

## 2016-09-13 NOTE — Progress Notes (Signed)
Report called to [place], RN on 2W; will transport pt to 2W38; pt to ambulate with walker; will cont. To monitor.  Lucas Munoz

## 2016-09-14 ENCOUNTER — Inpatient Hospital Stay (HOSPITAL_COMMUNITY): Payer: Commercial Managed Care - HMO

## 2016-09-14 DIAGNOSIS — I350 Nonrheumatic aortic (valve) stenosis: Secondary | ICD-10-CM

## 2016-09-14 DIAGNOSIS — Z954 Presence of other heart-valve replacement: Secondary | ICD-10-CM

## 2016-09-14 LAB — RENAL FUNCTION PANEL
ANION GAP: 6 (ref 5–15)
Albumin: 2 g/dL — ABNORMAL LOW (ref 3.5–5.0)
BUN: 34 mg/dL — ABNORMAL HIGH (ref 6–20)
CO2: 28 mmol/L (ref 22–32)
Calcium: 8.1 mg/dL — ABNORMAL LOW (ref 8.9–10.3)
Chloride: 101 mmol/L (ref 101–111)
Creatinine, Ser: 1.74 mg/dL — ABNORMAL HIGH (ref 0.61–1.24)
GFR calc Af Amer: 45 mL/min — ABNORMAL LOW (ref >60)
GFR calc non Af Amer: 39 mL/min — ABNORMAL LOW (ref >60)
GLUCOSE: 98 mg/dL (ref 65–99)
POTASSIUM: 3.5 mmol/L (ref 3.5–5.1)
Phosphorus: 2.5 mg/dL (ref 2.5–4.6)
Sodium: 135 mmol/L (ref 135–145)

## 2016-09-14 LAB — CBC WITH DIFFERENTIAL/PLATELET
BASOS ABS: 0 10*3/uL (ref 0.0–0.1)
Basophils Relative: 0 %
Eosinophils Absolute: 0.3 10*3/uL (ref 0.0–0.7)
Eosinophils Relative: 2 %
HEMATOCRIT: 26.8 % — AB (ref 39.0–52.0)
HEMOGLOBIN: 8.4 g/dL — AB (ref 13.0–17.0)
LYMPHS PCT: 5 %
Lymphs Abs: 0.6 10*3/uL — ABNORMAL LOW (ref 0.7–4.0)
MCH: 26.2 pg (ref 26.0–34.0)
MCHC: 31.3 g/dL (ref 30.0–36.0)
MCV: 83.5 fL (ref 78.0–100.0)
MONO ABS: 1.9 10*3/uL — AB (ref 0.1–1.0)
MONOS PCT: 15 %
NEUTROS ABS: 10.1 10*3/uL — AB (ref 1.7–7.7)
NEUTROS PCT: 78 %
Platelets: 222 10*3/uL (ref 150–400)
RBC: 3.21 MIL/uL — ABNORMAL LOW (ref 4.22–5.81)
RDW: 19.9 % — AB (ref 11.5–15.5)
WBC: 13 10*3/uL — ABNORMAL HIGH (ref 4.0–10.5)

## 2016-09-14 LAB — PROTIME-INR
INR: 2.83
Prothrombin Time: 30.3 seconds — ABNORMAL HIGH (ref 11.4–15.2)

## 2016-09-14 MED ORDER — WARFARIN SODIUM 4 MG PO TABS
4.0000 mg | ORAL_TABLET | Freq: Once | ORAL | Status: AC
  Administered 2016-09-14: 4 mg via ORAL
  Filled 2016-09-14: qty 1

## 2016-09-14 MED ORDER — SPIRONOLACTONE 25 MG PO TABS
12.5000 mg | ORAL_TABLET | Freq: Every day | ORAL | Status: DC
  Administered 2016-09-14 – 2016-09-16 (×3): 12.5 mg via ORAL
  Filled 2016-09-14 (×3): qty 1

## 2016-09-14 MED ORDER — CARVEDILOL 3.125 MG PO TABS
3.1250 mg | ORAL_TABLET | Freq: Two times a day (BID) | ORAL | Status: DC
  Administered 2016-09-14 – 2016-09-16 (×3): 3.125 mg via ORAL
  Filled 2016-09-14 (×4): qty 1

## 2016-09-14 NOTE — Progress Notes (Signed)
Patient ID: Lucas Munoz, male   DOB: Mar 26, 1951, 65 y.o.   MRN: [id number]    SUBJECTIVE:   PICC placed , initial co-ox on 10/14 47%.  He was started on milrinone. Milrinone increased to 0.375 mcg 08/29/16 for low mixed venous sat on RHC (CI 1.9).  Underwent pericardiocentesis on 10/20 with 775cc out. Fluid transudative.   TEE 10/23 confirmed amorphous material in LA appendage, appears to be forming thrombus.  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   Post-TAVR echo 10/25 with EF up to 45%, moderate LVH, bioprosthetic AVR with mean gradient 20 (higher than expected), PASP 37, normal RV, no pericardial effusion.   On 10/26 developed nausea/vomiting and hypotension.  He was started on norepinephrine and IV fluid with low CVP. CXR showed large right pleural effusion accumulation. Chest tube replaced on right, 1300 cc bloody fluid out.  Heparin stopped. He remained hypotensive.  1 unit PRBCs given. Also noted rise in creatinine with poor UOP.  Echo 10/26 with EF about 45%, small pericardial effusion with no tamponade. PCT up to 4.63, possible HCAP.  Suspect septic shock => resolved.   Chest tube removed 10/31.    Creatinine trending down. 2.4>1.7   Overall feeling much better. Denies SOB    RHC/LHC (08/29/16) Left Main  Mild ostial narrowing.  Left Anterior Descending  30% proximal LAD stenosis. 60% distal LAD stenosis. Moderate D1 without significant disease. Small to moderate D2 with long 70% ostial stenosis.  Left Circumflex  50% proximal LCx stenosis at OM1. Moderate OM1 with 30% ostial stenosis.  Right Coronary Artery  40% proximal and 40% mid RCA stenosis.   RA mean 19 RV 65/15 PA 69/34, mean 51 PCWP mean 43 (not sure we got an accurate PCWP).  AO 106/87 Oxygen saturations: PA 46% AO 92% Cardiac Output (Fick) 3.8  Cardiac Index (Fick) 1.9  Echo (10/14): EF 15%, severe AS, moderately decreased RV systolic function, PASP 65 mmHg,  moderate to large pericardial effusion without tamponade.   Scheduled Meds: . sodium chloride   Intravenous Once  . sodium chloride   Intravenous Once  . acetaminophen (TYLENOL) oral liquid 160 mg/5 mL  650 mg Per Tube Once   Or  . acetaminophen  650 mg Rectal Once  . amiodarone  200 mg Oral BID  . atorvastatin  40 mg Oral q1800  . furosemide  40 mg Oral Daily  . mouth rinse  15 mL Mouth Rinse BID  . piperacillin-tazobactam (ZOSYN)  IV  3.375 g Intravenous Q8H  . polyethylene glycol  17 g Oral Daily  . sodium chloride flush  10-40 mL Intracatheter Q12H  . sodium chloride flush  3 mL Intravenous Q12H  . Warfarin - Pharmacist Dosing Inpatient   Does not apply q1800   Continuous Infusions:   PRN Meds:.sodium chloride, Place/Maintain arterial line **AND** sodium chloride, acetaminophen, alum & mag hydroxide-simeth, docusate sodium, famotidine, lactulose, morphine injection, ondansetron (ZOFRAN) IV, oxyCODONE, oxyCODONE-acetaminophen, sodium chloride flush, sodium chloride flush, traMADol, traZODone, white petrolatum   Vitals:   09/13/16 1700 09/13/16 1811 09/13/16 2006 09/14/16 0504  BP: 96/76 132/78 (!) 99/52 115/63  Pulse: 79 (!) 108 72 65  Resp: (!) 24 18 18 18   Temp:  97.9 F (36.6 C) 98.2 F (36.8 C) 98.5 F (36.9 C)  TempSrc:  Oral Oral Oral  SpO2: 95% 97% 98% 95%  Weight:   189 lb 11.2 oz (86 kg) 185 lb 11.2 oz (84.2 kg)  Height:  5\' 8"  (1.727 m)     Intake/Output Summary (Last 24 hours) at 09/14/16 1007 Last data filed at 09/14/16 0900  Gross per 24 hour  Intake              436 ml  Output             2275 ml  Net            -1839 ml    LABS: Basic Metabolic Panel:  Recent Labs  16/10/96 0310 09/14/16 0425  NA 134* 135  K 3.7 3.5  CL 101 101  CO2 26 28  GLUCOSE 108* 98  BUN 46* 34*  CREATININE 2.40* 1.74*  CALCIUM 8.2* 8.1*  PHOS 2.7 2.5   Liver Function Tests:  Recent Labs  09/13/16 0310 09/14/16 0425  ALBUMIN 1.9* 2.0*   No results for  input(s): LIPASE, AMYLASE in the last 72 hours. CBC:  Recent Labs  09/13/16 0310 09/14/16 0425  WBC 11.5* 13.0*  NEUTROABS 8.4* 10.1*  HGB 8.3* 8.4*  HCT 25.4* 26.8*  MCV 81.7 83.5  PLT 177 222   Cardiac Enzymes: No results for input(s): CKTOTAL, CKMB, CKMBINDEX, TROPONINI in the last 72 hours. BNP: Invalid input(s): POCBNP D-Dimer: No results for input(s): DDIMER in the last 72 hours. Hemoglobin A1C: No results for input(s): HGBA1C in the last 72 hours. Fasting Lipid Panel: No results for input(s): CHOL, HDL, LDLCALC, TRIG, CHOLHDL, LDLDIRECT in the last 72 hours. Thyroid Function Tests: No results for input(s): TSH, T4TOTAL, T3FREE, THYROIDAB in the last 72 hours.  Invalid input(s): FREET3 Anemia Panel: No results for input(s): VITAMINB12, FOLATE, FERRITIN, TIBC, IRON, RETICCTPCT in the last 72 hours.  RADIOLOGY: US Renal  Result Date: 09/08/2016 CLINICAL DATA:  Acute renal failure EXAM: RENAL / URINARY TRACT ULTRASOUND COMPLETE COMPARISON:  CT abdomen pelvis 04/11/2016 FINDINGS: Right Kidney: Length: 10.8 cm. Negative for obstruction or mass. Limited evaluation the right kidney due to chest tube and bandages. Left Kidney: Length: 12.2 cm. Echogenicity within normal limits. No mass or hydronephrosis visualized. Bladder: Appears normal for degree of bladder distention. IMPRESSION: Negative renal ultrasound. Electronically Signed   By: Marlan Palau M.D.   On: 09/08/2016 16:09   Ct Cardiac Morph/pulm Vein W/cm&w/o Ca Score  Addendum Date: 09/01/2016   ADDENDUM REPORT: 09/01/2016 17:18 CLINICAL DATA:  Aortic stenosis EXAM: Cardiac TAVR CT TECHNIQUE: The patient was scanned on a Philips 256 scanner. A 120 kV retrospective scan was triggered in the descending thoracic aorta at 111 HU's. Gantry rotation speed was 270 msecs and collimation was .9 mm. No beta blockade or nitro were given. The 3D data set was reconstructed in 5% intervals of the R-R cycle. Systolic and diastolic  phases were analyzed on a dedicated work station using MPR, MIP and VRT modes. The patient received 80 cc of contrast. 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 Heavily calcified Aorta:  Aortic root dilated Sinotubular Junction:  32 mm Ascending Thoracic Aorta:  43 mm Aortic Arch:  30 mm Descending Thoracic Aorta:  26 mm Sinus of Valsalva Measurements: Non-coronary:  33 mm Right -coronary:  31 mm Left -coronary:  32 mm Coronary Artery Height above Annulus: Left Main:  19 mm above annulus Right Coronary:  10 mm above annulus Virtual Basal Annulus Measurements: Maximum/Minimum Diameter:  27.1 x 20.3 mm Perimeter:  78 mm Area:  442 mm2 Coronary Arteries:  Sufficient height above annulus for deployment Optimum Fluoroscopic Angle for Delivery:  LAO 29  degrees IMPRESSION: 1) functionally bicuspid AV heavily calcified with annulus suitable for 26 mm Sapien 3 valve 2) Moderate aortic root enlargement 4.3 cm 3) Large pericardial effusion patient to have pericardiocentesis post CT 4) Cannot r/o LAA thrombus suggest TEE correlation 5) Optimum angiographic angle for deployment LAO 29 degrees 6) Coronary heights sufficient for deployment Charlton Haws Electronically Signed   By: Charlton Haws M.D.   On: 09/01/2016 17:18   Result Date: 09/01/2016 EXAM: OVER-READ INTERPRETATION  CT CHEST The following report is an over-read performed by radiologist Dr. Royal Piedra [facility] on 09/01/2016. This over-read does not include interpretation of cardiac or coronary anatomy or pathology. The coronary calcium score/coronary CTA interpretation by the cardiologist is attached. COMPARISON:  Chest CT 05/01/2016. FINDINGS: Large pericardial effusion. No pericardial calcification. Aortic atherosclerosis with aneurysmal dilatation of the ascending thoracic aorta which measures up to 4.8 cm in diameter (unchanged). Large right-sided pleural effusion layering dependently. Extensive passive  atelectasis in the right lower lobe. Tiny calcified granuloma in the periphery of the right lower lobe. 4 mm subpleural noncalcified pulmonary nodule in the left lower lobe abutting the major fissure (image 71 of series 512), unchanged compared to prior study 05/01/2016, favored to represent a subpleural lymph node. No other suspicious appearing pulmonary nodules or masses are noted in the visualized portions of the thorax. Partially calcified pleural plaque in the right hemithorax (image 31 of series 513) again noted. No calcified pleural plaques in the left hemithorax. Visualized portions of the upper abdomen demonstrate a nodular contour of the liver, suggestive of underlying cirrhosis. No aggressive appearing lytic or blastic lesions are noted in the visualized portions of the skeleton. Right upper extremity PICC with tip terminating in the distal superior vena cava. IMPRESSION: 1. Interval increased size of what is now a large pericardial effusion. No associated pericardial calcification. 2. Enlargement of a large layering right-sided pleural effusion. This is associated with extensive passive atelectasis in the right lower lobe. 3. Aortic atherosclerosis, with similar aneurysmal dilatation of the ascending thoracic aorta which measures up to 4.8 cm in diameter. 4. Morphologic changes in the liver indicative of cirrhosis, as above. 5. 4 mm subpleural nodule in the periphery of the left lower lobe is nonspecific, but statistically likely a subpleural lymph node. No follow-up needed if patient is low-risk. Non-contrast chest CT can be considered in 12 months if patient is high-risk. This recommendation follows the consensus statement: Guidelines for Management of Incidental Pulmonary Nodules Detected on CT Images: From the Fleischner Society 2017; Radiology 2017; 284:228-243. 6. Additional incidental findings, as above. Electronically Signed: By: Trudie Reed M.D. On: 09/01/2016 14:38   Dg Chest Port 1  View  Result Date: 09/12/2016 CLINICAL DATA:  Right chest tube removal EXAM: PORTABLE CHEST 1 VIEW COMPARISON:  09/11/2016 FINDINGS: Right chest tube has been removed. There is small right pleural effusion with right basilar atelectasis or infiltrate. No pneumothorax. Right arm PICC line with tip in SVC right atrium junction. IMPRESSION: Small right pleural effusion with right basilar atelectasis or infiltrate. Stable right arm PICC line position. No pneumothorax. Electronically Signed   By: Natasha Mead M.D.   On: 09/12/2016 14:55   Dg Chest Port 1 View  Result Date: 09/11/2016 CLINICAL DATA:  Respiratory failure, shortness of breath, status post aortic valve replacement on October24, 2017. EXAM: PORTABLE CHEST 1 VIEW COMPARISON:  Portable chest x-rays of September 05, 2016 and September 10, 2016. FINDINGS: The left lung is well-expanded and clear. On the  right no pneumothorax is evident. There is a small right pleural effusion. The interstitial markings remain prominent especially at the lung base. The right-sided chest tube tip projects over the inferior margin of the posterior aspect of the right sixth rib. The cardiac silhouette remains enlarged. The pulmonary vascularity is less engorged. There is calcification in the wall of the aortic arch. There degenerative changes of the shoulders and there is an old healed midshaft right clavicular fracture. The right-sided PICC line tip projects over the midportion of the SVC. IMPRESSION: Mild improvement in the pulmonary interstitium overall consistent with decreasing interstitial edema. Persistent small right pleural effusion. The right-sided chest tube is in stable position. Electronically Signed   By: David  SwazilandJordan M.D.   On: 09/11/2016 07:40   Dg Chest Port 1 View  Result Date: 09/10/2016 CLINICAL DATA:  Chest congestion.  Cough. EXAM: PORTABLE CHEST 1 VIEW COMPARISON:  Yesterday. FINDINGS: Stable enlarged cardiac silhouette and transcatheter aortic valve.  The right PICC is unchanged. A right chest tube remains in place with no pneumothorax. The pulmonary vasculature and interstitial markings remain prominent. Thoracic spine degenerative changes. IMPRESSION: Stable cardiomegaly and changes of congestive heart failure. Electronically Signed   By: Beckie SaltsSteven  Reid M.D.   On: 09/10/2016 08:14   Dg Chest Port 1 View  Result Date: 09/09/2016 CLINICAL DATA:  Status post TAVR and right chest tube placement for hemothorax. EXAM: PORTABLE CHEST 1 VIEW COMPARISON:  09/08/2016 FINDINGS: Right chest tube remains in place with no significant pleural fluid volume. No pneumothorax. There remains atelectasis in the right lower lung. PICC line positioning stable in the distal SVC. Stable cardiac enlargement and appearance of transcatheter aortic valve. Suspect mild residual interstitial edema which appears stable. IMPRESSION: No significant residual right pleural fluid volume with atelectasis remaining in the right lower lung. Stable mild interstitial edema. Electronically Signed   By: Irish LackGlenn  Yamagata M.D.   On: 09/09/2016 09:13   Dg Chest Port 1 View  Result Date: 09/08/2016 CLINICAL DATA:  Shortness of breath.  CHF. EXAM: PORTABLE CHEST 1 VIEW COMPARISON:  09/07/2016. FINDINGS: Right PICC line and right chest tube in stable position. No pneumothorax. Right pleural effusion is improved slightly. Cardiomegaly with diffuse pulmonary interstitial prominence mild congestive heart failure cannot be excluded . IMPRESSION: 1. Right PICC line right chest tube in stable position. No pneumothorax. Right pleural effusion has improved slightly. 2. With bilateral from interstitial prominence. Mild congestive heart failure cannot be excluded . Electronically Signed   By: Maisie Fushomas  Register   On: 09/08/2016 08:48   Dg Chest Port 1 View  Result Date: 09/07/2016 CLINICAL DATA:  Hemothorax with new right chest tube. EXAM: PORTABLE CHEST 1 VIEW COMPARISON:  Earlier today FINDINGS: Decreased  but still extensive right hemothorax after chest tube placement. No visualized gas component. Right upper extremity PICC with tip at the SVC level. Unchanged cardiopericardial enlargement. Status post transcatheter aortic valve replacement. IMPRESSION: 1. Right chest tube placement with diminished hemothorax. 2. Elsewhere stable chest. Electronically Signed   By: Marnee SpringJonathon  Watts M.D.   On: 09/07/2016 09:05   Dg Chest Port 1 View  Result Date: 09/07/2016 CLINICAL DATA:  Vomiting EXAM: PORTABLE CHEST 1 VIEW COMPARISON:  Yesterday FINDINGS: Stable right upper extremity PICC. Swan-Ganz catheter and right jugular introducer have been removed. Right chest tube is been removed. A large right pleural effusion has accumulated. There is compressive atelectasis within much of the right lung. Vascular congestion. Left lung is otherwise clear. Cardiomegaly. Small right pneumothorax is  suspected. IMPRESSION: Right chest tube removed. Large right pleural effusion has reaccumulated. Very small right apical pneumothorax has also developed. Electronically Signed   By: Jolaine Click M.D.   On: 09/07/2016 07:39   Dg Chest Port 1 View  Result Date: 09/06/2016 CLINICAL DATA:  Cardiac surgery EXAM: PORTABLE CHEST 1 VIEW COMPARISON:  Yesterday FINDINGS: Right upper extremity PICC, right chest tube, right jugular introducer, right Swan-Ganz catheter are stable. There is no pneumothorax. Small right pleural effusion is stable. Vascular congestion and mild edema are worse. Lower lung volumes. Cardiac silhouette remains markedly enlarged. IMPRESSION: Worsening vascular congestion and edema. No pneumothorax. Electronically Signed   By: Jolaine Click M.D.   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 Postoperative image. EXAM: PORTABLE CHEST 1 VIEW COMPARISON:  CT chest 09/01/2016 and single view of the chest 08/26/2016. FINDINGS: Endotracheal tube  is in place with tip in good position at the level of the clavicular heads. Right IJ approach Swan-Ganz catheter tip is in the proximal right main pulmonary artery. Tip of right PICC projects in the lower superior vena cava. NG tube courses into the stomach. Right chest tube is identified. There is a small right pleural effusion and basilar atelectasis, improved since the prior chest film. The lungs are otherwise clear. The cardiopericardial silhouette is enlarged. No pneumothorax. IMPRESSION: Support tubes and lines projecting good position. Negative for pneumothorax. Decreased small right pleural effusion and basilar atelectasis. Enlarged cardiopericardial silhouette consistent with cardiomegaly and pericardial effusion as seen on CT scan. Electronically Signed   By: Drusilla Kanner M.D.   On: 09/05/2016 13:56   Dg Chest Port 1 View  Result Date: 08/26/2016 CLINICAL DATA:  65 year old male with a history of shortness of breath EXAM: PORTABLE CHEST 1 VIEW COMPARISON:  08/25/2016, CT chest 05/01/2016 FINDINGS: Compare to the prior plain film there is unchanged size of the cardiac silhouette. Calcifications of the aortic arch. Interval placement of right upper extremity PICC with the tip appearing to terminate at the superior cavoatrial junction. Increasing opacity at the right base with thickening of the minor fissure, obscuration the right hemidiaphragm and right heart border. Interlobular septal thickening. IMPRESSION: Evidence of CHF with increasing right-sided pleural effusion/atelectasis. Unchanged cardiac silhouette in this patient with a history of pericardial effusion imaged on prior CT 05/01/2016. Interval placement of right upper extremity PICC. Aortic atherosclerosis. Signed, Yvone Neu. Loreta Ave, DO [facility] Electronically Signed   By: Gilmer Mor D.O.   On: 08/26/2016 09:37   Dg Chest Port 1 View  Result Date: 08/25/2016 CLINICAL  DATA:  Acute on chronic systolic congestive heart failure. Dilated cardiomyopathy. Acute kidney injury. EXAM: PORTABLE CHEST 1 VIEW COMPARISON:  None. FINDINGS: Marked cardiac enlargement noted. Diffuse interstitial infiltrates consistent with mild interstitial edema. Small right pleural effusion also seen with right basilar atelectasis. IMPRESSION: Mild congestive heart failure, with small right pleural effusion and right basilar atelectasis. Electronically Signed   By: Myles Rosenthal M.D.   On: 08/25/2016 15:14   Ct Angio Chest/abd/pel For Dissection W And/or W/wo  Result Date: 09/03/2016 CLINICAL DATA:  65 year old male with history of severe aortic stenosis. Preprocedural study prior to potential transcatheter aortic valve replacement. Recent history of pericardiocentesis. EXAM: CT ANGIOGRAPHY CHEST, ABDOMEN AND PELVIS TECHNIQUE: Multidetector CT imaging through the chest, abdomen and pelvis was performed using the standard protocol during bolus administration of intravenous contrast. Multiplanar  reconstructed images and MIPs were obtained and reviewed to evaluate the vascular anatomy. CONTRAST:  80 mL of Isovue 370. COMPARISON:  Cardiac gated CTA 09/01/2016. FINDINGS: CTA CHEST FINDINGS Cardiovascular: Heart size is mildly enlarged. Small amount of residual pericardial fluid and/or thickening, decreased compared to the prior study. Pericardiocentesis catheter in place with tip terminating high in a superior pericardial recess adjacent to the aortic arch. No pericardial calcification. Severe thickening calcification of the aortic valve, which has an appearance suggestive of a bicuspid valve. There is aortic atherosclerosis, as well as atherosclerosis of the great vessels of the mediastinum and the coronary arteries, including calcified atherosclerotic plaque in the left anterior descending and right coronary arteries. 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 Large filling  defect within the left atrial appendage, concerning for potential left atrial appendage thrombus. Right upper extremity PICC with tip terminating at the superior cavoatrial junction. Mediastinum/Lymph Nodes: Several borderline enlarged and minimally enlarged mediastinal lymph nodes measuring up to 14 mm in short axis, slightly increased compared to the prior study, likely reactive. Esophagus is unremarkable in appearance. No axillary lymphadenopathy. Lungs/Pleura: Large right pleural effusion again noted, lying dependently. This is associated with near complete passive atelectasis of the right lower lobe. Trace left pleural effusion with minimal passive subsegmental atelectasis in the dependent left lower lobe. No acute consolidative airspace disease. Previously noted tiny subpleural known nodule in the anterior aspect of the left lower lobe is no longer noted, presumably a reactive subpleural lymph node on the prior examination. Partially calcified small right-sided pleural plaque is unchanged (image 63 of series 5). No other calcified pleural plaques are identified. Tiny calcified granulomas are again noted in the periphery of the right lower lobe, now with an atelectatic lung. Musculoskeletal/Soft Tissues: There are no aggressive appearing lytic or blastic lesions noted in the visualized portions of the skeleton. CTA ABDOMEN AND PELVIS FINDINGS Hepatobiliary: Liver has a shrunken appearance and nodular contour, compatible with underlying cirrhosis. No discrete cystic or solid hepatic lesions. No intra or extrahepatic biliary ductal dilatation. Large rim calcified gallstone in the gallbladder measuring up to 3.8 cm in diameter. No current findings to suggest an acute cholecystitis at this time. Pancreas: No pancreatic mass or peripancreatic inflammatory changes. No pancreatic ductal dilatation. Pancreatic atrophy. No pancreatic or peripancreatic fluid. Spleen: Unremarkable. Adrenals/Urinary Tract: Bilateral  kidneys and bilateral adrenal glands are unremarkable in appearance. No hydroureteronephrosis. In the dependent portion of the urinary bladder there is a 12 mm calculus (image 254 of series 5). Urinary bladder is otherwise unremarkable in appearance. Stomach/Bowel: Normal appearance of the stomach. No pathologic dilatation of small bowel or colon. Numerous colonic diverticulae are noted, without surrounding inflammatory changes to suggest an acute diverticulitis at this time. The appendix is not confidently identified and may be surgically absent. Regardless, there are no inflammatory changes noted adjacent to the cecum to suggest the presence of an acute appendicitis at this time. Vascular/Lymphatic: Aortic atherosclerosis with vascular findings and measurements pertinent to potential TAVR procedure, as detailed below. No aneurysm or dissection identified in the abdominal or pelvic vasculature. Celiac axis, superior mesenteric artery and inferior mesenteric artery are all widely patent without hemodynamically significant stenosis. Single renal arteries bilaterally are widely patent. Portal vein is mildly dilated measuring 16 mm in the porta hepatis. Reproductive: Prostate gland and seminal vesicles are unremarkable in appearance. Other: Small left inguinal hernia containing a short segment of small bowel. No significant volume of ascites. No pneumoperitoneum. Musculoskeletal: There  are no aggressive appearing lytic or blastic lesions noted in the visualized portions of the skeleton. VASCULAR MEASUREMENTS PERTINENT TO TAVR: AORTA: Minimal Aortic Diameter -  18 x 14 mm Severity of Aortic Calcification -  moderate RIGHT PELVIS: Right Common Iliac Artery - Minimal Diameter - 11.0 x 9.1 mm Tortuosity - moderate Calcification - mild to moderate Right External Iliac Artery - Minimal Diameter - 8.7 x 8.9 mm Tortuosity - moderate to severe Calcification - none Right Common Femoral Artery - Minimal Diameter - 9.2 x 6.8 mm  Tortuosity - mild Calcification - mild LEFT PELVIS: Left Common Iliac Artery - Minimal Diameter - 9.5 x 10.0 mm Tortuosity - severe Calcification - none Left External Iliac Artery - Minimal Diameter - 8.1 x 7.9 mm Tortuosity - moderate Calcification - none Left Common Femoral Artery - Minimal Diameter - 8.5 x 6.3 mm Tortuosity - mild Calcification - mild Review of the MIP images confirms the above findings. IMPRESSION: 1. Vascular findings and measurements pertinent to potential TAVR procedure, as detailed above. This patient appears to have suitable pelvic arterial access, however, this access is most optimal on the right side secondary to the lesser degree of tortuosity. Additionally, there is a small left inguinal hernia containing a short segment of small bowel which comes in close proximity to the left common femoral artery. 2. Severe thickening calcification of what appears to be a bicuspid aortic valve, compatible with the reported clinical history of severe aortic stenosis. 3. Interval pericardiocentesis with persistent indwelling pericardiocentesis catheter, with significant decreased size of what is now only a small amount of residual pericardial fluid and/or thickening. 4. Large filling defect in the tip of the left atrial appendage, concerning for potential left atrial appendage thrombus (although this could simply reflect pseudo thrombus secondary to contrast bolus timing). Correlation with TEE is recommended, if not already performed. 5. Aortic atherosclerosis, in addition to 2 vessel coronary artery disease. Please note that although the presence of coronary artery calcium documents the presence of coronary artery disease, the severity of this disease and any potential stenosis cannot be assessed on this non-gated CT examination. Assessment for potential risk factor modification, dietary therapy or pharmacologic therapy may be warranted, if clinically indicated. 6. Persistent large right-sided pleural  effusion with extensive passive atelectasis in the right lower lobe. Trace left pleural effusion with minimal dependent passive atelectasis in the left lower lobe. 7. Cholelithiasis without evidence of acute cholecystitis. 8. Colonic diverticulosis without evidence of acute diverticulitis at this time. 9. Morphologic changes in the liver compatible with underlying cirrhosis. 10. Additional incidental findings, as above. Electronically Signed   By: Trudie Reed M.D.   On: 09/03/2016 12:24    PHYSICAL EXAM General: NAD. sitting in the chair.  Neck: JVP 8 Lungs: Decreased breath sounds on right.  CV: Nondisplaced PMI.  Heart irregular S1/S2, no S3/S4, 2/6 SEM RUSB. R and LLE trace-1+edema.    Abdomen: Soft, NT, ND, no HSM. No bruits or masses. +BS  Neurologic: Alert and oriented x 3.  Psych: Normal affect. Extremities: No clubbing or cyanosis. RUE PICC R and LLE ted hose.   TELEMETRY: Reviewed, A fib 80s-90s  ASSESSMENT AND PLAN: 65 y/o ?with a h/o severe AS, LV dysfxn, HTN, noncompliance, thyroid nodules, pericardial effusion, cholelithiasis, and persistent AF on eliquis, who presentedto clinic on 08/23/16 with volume overload and was admitted for further work up of AS and CHF.  1. Acute on chronic systolic CHF: EF 16-10% in setting of severe aortic stenosis.  May be due to severe aortic stenosis given lack of flow-limiting CAD.  This admission, he was initially hypotensive with SBP in 80s though BP has improved to 100s-110s range.  He was also markedly volume overloaded. PICC was placed, low output confirmed by co-ox 47%.  Milrinone 0.25 started then increased to 0.375, now weaned off.  Repeat echo post-TAVR with EF up to 45%.  Repeat limited echo 10/26 with small pericardial effusion, no tamponade.  Developed suspected septic shock most recently, BP better now and off pressors.  Pending co-ox today. - Lasix 40 mg daily.    - Renal function improving. Restart spironolactone 12.5 daily.  -  Add Coreg 3.125 mg bid.  2. Hypotension:  Pleural effusion drained, no pericardial tamponade, EF up to 45% and valve functioning ok (mildly elevated gradient).  Co-ox has been ok.  Suspect septic shock with rise in WBCs and high procalcitonin though no fever.  ?Right-sided PNA.  Blood cultures no growth so far.  Added empiric antibiotics with vancomycin/Zosyn => now off vanco, continue Zosyn x 8 days. Hydrocortisone added by CCM.  Now weaned off pressors/hydrocortisone.   3. Aortic stenosis: Severe AS. 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 McAlhany and [place]. - Bioprosthetic valve stable on post-op echo though mean gradient a bit high at 20 mmHg.  4. AKI on CKD stage III: Baseline creatinine 1.7-1.8. Creatinine starting to trend down. Today, creatinine trending down to 1.74 with resolution of septic shock.    5. Atrial fibrillation: Persistent, with RVR this admission.  He has been in atrial fibrillation at least since 6/17.  No prior DCCV. Not sure how long prior to 6/17 he was in atrial fibrillation.  - Continue amiodarone for rate control, now po.  Eventually would like to cardiovert. - He had developing thrombus on TEE => heparin held with bleeding into his right chest.   - With LAA thrombus despite Eliquis, will use Coumadin for home. Goal INR 2-3, supratherapeutic today. Todays INR 2.83 - Will not be able to do DCCV at this point with atrial thrombus => repeat TEE with possible DCCV in 1 month.  6. Pericardial effusion: Moderate to large, no tamponade.  s/p pericardiocentesis with Dr Clifton James 10/20. Pericardial drain removed 09/05/16.  Repeat echo 10/27 with small effusion, no tamponade.   7. Cirrhosis: Noted on prior abdominal CT, LFTs improved with diuresis.  8. CAD:  LHC with moderate coronary disease, nothing flow-limiting.  - Stop ASA with therapeutic INR.  9. Hyponatremia: Fluid restrict. Sodium improving. 10. Hemothorax: On right. Chest tube now out.  Will get CXR PA/lateral today.  11. Anemia:  Hemoglobin stable, Fe studies looked ok.  Follow closely.   Amy Clegg NP-C  09/14/2016 10:07 AM   Patient seen with NP, agree with the above note.  Creatinine better, volume ok.  Now on po Lasix and off milrinone.  Will add low dose Coreg and spironolactone today.   INR therapeutic on warfarin.   Rate-controlled atrial fibrillation on amiodarone, adding Coreg.  Decrease amiodarone to daily tomorrow . Will repeat TEE at 1 month and if thrombus is resolved, attempt DCCV.   Repeat CXR to look at right effusion.   Possible discharge tomorrow vs Saturday with home health.    Marca Ancona 09/14/2016 10:42 AM

## 2016-09-14 NOTE — Care Management Note (Signed)
Case Management Note  Patient Details  Name: Lucas Munoz MRN: [id number] Date of Birth: [birth year]  Subjective/Objective:    Pt admitted with acute on chronic systolic HF                Action/Plan:  CM assessed pt - pt alert and oriented.  PTA independent from home alone.  Pt informed CM that he adheres to low sodium diet and daily weights.  CM will continue to follow for discharge needs    Expected Discharge Date:                  Expected Discharge Plan:     In-House Referral:     Discharge planning Services  CM Consult  Post Acute Care Choice:    Choice offered to:     DME Arranged:    DME Agency:     HH Arranged:    HH Agency:     Status of Service:  In process, will continue to follow  If discussed at Long Length of Stay Meetings, dates discussed:    Additional Comments: 09/14/2016 Discussed in LOS 09/14/16; pt remains appropriate for continued stay.  Physician Advisor has deemed this pt high risk - assigned [facility] program during meeting [name] accepted [facility] referral for both lasix kit and daily weights.  Pt transferred to 2W late evening 09/13/16 - ICU CM communicated plan to 2W CM.   [name], RN 09/14/2016, 8:49 AM

## 2016-09-14 NOTE — Progress Notes (Signed)
CARDIAC REHAB PHASE I   PRE:  Rate/Rhythm: 86 a fib  BP:  Sitting: 113/61        SaO2: 95 RA  MODE:  Ambulation: 500 ft   POST:  Rate/Rhythm: 131 a fib  BP:  Sitting: 153/72         SaO2: 94 RA  Pt ambulated 500 ft on RA, rolling walker, steady gait, tolerated well. Pt c/o some DOE towards end of walk, brief standing rest x1. Sats 94% on RA. Pt would benefit from RW for home use, will notify CM. Encouraged additional ambulation x2 today, IS. Pt to recliner after walk, call bell within reach. Will follow.  [phone] Joylene Grapes, RN, BSN 09/14/2016 10:53 AM

## 2016-09-14 NOTE — Progress Notes (Signed)
9 Days Post-Op Procedure(s) (LRB): TRANSCATHETER AORTIC VALVE REPLACEMENT, TRANSFEMORAL (N/A) TRANSESOPHAGEAL ECHOCARDIOGRAM (TEE) (N/A) CHEST TUBE INSERTION (Right) Subjective:  Ambulated well today but got tired toward the end.  Co-ox 61% off Milrinone.    Objective: Vital signs in last 24 hours: Temp:  [97.9 F (36.6 C)-98.5 F (36.9 C)] 98.2 F (36.8 C) (11/02 1320) Pulse Rate:  [65-108] 87 (11/02 1320) Cardiac Rhythm: Atrial fibrillation (11/02 0700) Resp:  [14-24] 18 (11/02 0504) BP: (96-132)/(52-78) 105/61 (11/02 1320) SpO2:  [94 %-98 %] 94 % (11/02 1320) Weight:  [84.2 kg (185 lb 11.2 oz)-86 kg (189 lb 11.2 oz)] 84.2 kg (185 lb 11.2 oz) (11/02 0504)  Hemodynamic parameters for last 24 hours:    Intake/Output from previous day: 11/01 0701 - 11/02 0700 In: 740 [P.O.:660; I.V.:30; IV Piggyback:50] Out: 2475 [Urine:2475] Intake/Output this shift: Total I/O In: 256 [P.O.:256] Out: 600 [Urine:600]  General appearance: alert and cooperative Neurologic: intact Heart: irregularly irregular rhythm Lungs: diminished breath sounds RLL no peripheral edema  Lab Results:  Recent Labs  09/13/16 0310 09/14/16 0425  WBC 11.5* 13.0*  HGB 8.3* 8.4*  HCT 25.4* 26.8*  PLT 177 222   BMET:  Recent Labs  09/13/16 0310 09/14/16 0425  NA 134* 135  K 3.7 3.5  CL 101 101  CO2 26 28  GLUCOSE 108* 98  BUN 46* 34*  CREATININE 2.40* 1.74*  CALCIUM 8.2* 8.1*    PT/INR:  Recent Labs  09/14/16 0425  LABPROT 30.3*  INR 2.83   ABG    Component Value Date/Time   PHART 7.420 09/05/2016 1350   HCO3 28.4 (H) 09/05/2016 1350   TCO2 30 09/05/2016 1350   O2SAT 61.3 09/13/2016 0328   CBG (last 3)  No results for input(s): GLUCAP in the last 72 hours.  Assessment/Plan: S/P Procedure(s) (LRB): TRANSCATHETER AORTIC VALVE REPLACEMENT, TRANSFEMORAL (N/A) TRANSESOPHAGEAL ECHOCARDIOGRAM (TEE) (N/A) CHEST TUBE INSERTION (Right)  He is hemodynamically stable.   Renal  function continues to improve daily.  Therapeutic on coumadin.  CXR shows persistent density at right base which is probably some residual clot and atelectasis. Few air bubbles due to chest tube that was there. There is nothing to do about it at this time. Continue IS and will need follow up CXR in a couple weeks.   He can go home when ok with heart failure team.   LOS: 21 days    [name] 09/14/2016

## 2016-09-14 NOTE — Progress Notes (Signed)
ANTICOAGULATION CONSULT NOTE - Follow Up Consult  Pharmacy Consult for warfarin Indication: atrial fibrillation and atrial thrombus  Allergies  Allergen Reactions  . Potassium-Containing Compounds Other (See Comments)    Upset stomach    Patient Measurements: Height: 5\' 8"  (172.7 cm) Weight: 185 lb 11.2 oz (84.2 kg) IBW/kg (Calculated) : 68.4  Vital Signs: Temp: 98.5 F (36.9 C) (11/02 0504) Temp Source: Oral (11/02 0504) BP: 115/63 (11/02 0504) Pulse Rate: 65 (11/02 0504)  Labs:  Recent Labs  09/12/16 0420 09/13/16 0310 09/13/16 0807 09/14/16 0425  HGB 8.3* 8.3*  --  8.4*  HCT 24.9* 25.4*  --  26.8*  PLT 144* 177  --  222  LABPROT 21.8* 36.7* 35.5* 30.3*  INR 1.87 3.60 3.44 2.83  CREATININE 2.50* 2.40*  --  1.74*    Estimated Creatinine Clearance: 43.5 mL/min (by C-G formula based on SCr of 1.74 mg/dL (H)).  Assessment: 65yom on eliquis pta for afib, transitioned to heparin for pericardiocentesis on 10/20 with drain placement. Drain removed 10/24 w/high risk TAVR same day. Patient initially started on heparin bridge to warfarin post procedure for history of afib and developing atrial thrombus that was seen on TEE, however anticoagulation held with bleeding into his right chest. Warfarin restarted without heparin 10/29 as chest tube still in and draining. Patient will need warfarin instead of eliquis for treatment of LAA thrombus. INR 1.52 > 1.31 > 1.87 then big jump to 3.6. Rechecked INR and it was 3.4. Dose was held*1 and INR with big decrease 3.4 > 2.83. Patient is very sensitive to warfarin. Will give half the original starting dose and see how he does. Do not want to hold again and let patient fall subtherapeutic before discharge. (Anticipated soon)  INR goal was initally 2.5 - 3.5 however, per Dr. Shirlee Latch INR goal decreased to 2-3.  Goal of Therapy:  Heparin level 0.3-0.7 units/ml  INR 2-3 Monitor platelets by anticoagulation protocol: Yes   Plan:  1) Warfarin  4mg *1 tonight 2) Daily INR, CBC 3) Warfarin education today  Sherron Monday, PharmD Clinical Pharmacy Resident Pager: [phone] 09/14/16 10:46 AM

## 2016-09-15 DIAGNOSIS — I481 Persistent atrial fibrillation: Principal | ICD-10-CM

## 2016-09-15 LAB — RENAL FUNCTION PANEL
ALBUMIN: 1.9 g/dL — AB (ref 3.5–5.0)
ANION GAP: 7 (ref 5–15)
BUN: 25 mg/dL — AB (ref 6–20)
CALCIUM: 8 mg/dL — AB (ref 8.9–10.3)
CO2: 27 mmol/L (ref 22–32)
Chloride: 103 mmol/L (ref 101–111)
Creatinine, Ser: 1.32 mg/dL — ABNORMAL HIGH (ref 0.61–1.24)
GFR calc Af Amer: >60 mL/min (ref >60)
GFR, EST NON AFRICAN AMERICAN: 54 mL/min — AB (ref >60)
Glucose, Bld: 107 mg/dL — ABNORMAL HIGH (ref 65–99)
PHOSPHORUS: 2.4 mg/dL — AB (ref 2.5–4.6)
POTASSIUM: 3.4 mmol/L — AB (ref 3.5–5.1)
SODIUM: 137 mmol/L (ref 135–145)

## 2016-09-15 LAB — CBC WITH DIFFERENTIAL/PLATELET
BASOS ABS: 0 10*3/uL (ref 0.0–0.1)
BASOS PCT: 0 %
EOS PCT: 2 %
Eosinophils Absolute: 0.2 10*3/uL (ref 0.0–0.7)
HCT: 26.9 % — ABNORMAL LOW (ref 39.0–52.0)
Hemoglobin: 8.6 g/dL — ABNORMAL LOW (ref 13.0–17.0)
LYMPHS PCT: 8 %
Lymphs Abs: 1.1 10*3/uL (ref 0.7–4.0)
MCH: 26.7 pg (ref 26.0–34.0)
MCHC: 32 g/dL (ref 30.0–36.0)
MCV: 83.5 fL (ref 78.0–100.0)
Monocytes Absolute: 1.3 10*3/uL — ABNORMAL HIGH (ref 0.1–1.0)
Monocytes Relative: 10 %
NEUTROS ABS: 11 10*3/uL — AB (ref 1.7–7.7)
Neutrophils Relative %: 80 %
PLATELETS: 276 10*3/uL (ref 150–400)
RBC: 3.22 MIL/uL — AB (ref 4.22–5.81)
RDW: 20.2 % — AB (ref 11.5–15.5)
WBC: 13.6 10*3/uL — AB (ref 4.0–10.5)

## 2016-09-15 LAB — COOXEMETRY PANEL
Carboxyhemoglobin: 1.9 % — ABNORMAL HIGH (ref 0.5–1.5)
Methemoglobin: 1 % (ref 0.0–1.5)
O2 Saturation: 44.2 %
TOTAL HEMOGLOBIN: 8.7 g/dL — AB (ref 12.0–16.0)

## 2016-09-15 LAB — PROTIME-INR
INR: 2.96
Prothrombin Time: 31.4 seconds — ABNORMAL HIGH (ref 11.4–15.2)

## 2016-09-15 MED ORDER — WARFARIN SODIUM 2.5 MG PO TABS
2.5000 mg | ORAL_TABLET | Freq: Once | ORAL | Status: AC
  Administered 2016-09-15: 2.5 mg via ORAL
  Filled 2016-09-15: qty 1

## 2016-09-15 MED ORDER — POTASSIUM CHLORIDE 20 MEQ PO PACK
20.0000 meq | PACK | Freq: Two times a day (BID) | ORAL | Status: AC
  Administered 2016-09-15: 20 meq via ORAL
  Filled 2016-09-15 (×2): qty 1

## 2016-09-15 NOTE — Consult Note (Addendum)
   [facility] CM Inpatient Consult   09/15/2016  Daylen Lipsky 1950/12/13 [id number]  Patient was assessed for [facility] for community services. Patient was previously active with [facility].  Chart review reveals the patient is a 65 y/o male with a h/o severe Aortic Stenosis, LV dysfxn, HTN, noncompliance, thyroid nodules, pericardial effusion, cholelithiasis, and persistent AF on eliquis, who presentedto clinic on 08/23/16 with volume overload and was admitted for further work up of AS and HF.  Admitted with  Acute on chronic systolic HF: EF 64-29% in setting of severe aortic stenosis. S/P TAVR.  Met with patient at bedside regarding being restarted with [facility] services.  Patient states, "I tell you I have had a hard time getting back to a good measure.  I'd say I am about 70% better and I am willing to do what I need to get better.  My niece thinks I can get to an assisted living or something.  I do good in my duplex but I know I am going to need some equipment either day.   Consent form signed and folder with [facility] information given.  Of note, [facility] Care Management services does not replace or interfere with any services that are arranged by inpatient case management or social work. For additional questions or referrals please contact:  Natividad Brood, RN BSN [facility] Liaison  [phone] business mobile phone Toll free office [phone]

## 2016-09-15 NOTE — Progress Notes (Signed)
ANTICOAGULATION CONSULT NOTE - Follow Up Consult  Pharmacy Consult for warfarin Indication: atrial fibrillation and atrial thrombus  Allergies  Allergen Reactions  . Potassium-Containing Compounds Other (See Comments)    Upset stomach    Patient Measurements: Height: 5\' 8"  (172.7 cm) Weight: 185 lb 3 oz (84 kg) IBW/kg (Calculated) : 68.4  Vital Signs: Temp: 99 F (37.2 C) (11/03 0338) Temp Source: Oral (11/03 0338) BP: 107/62 (11/03 0810) Pulse Rate: 80 (11/03 0810)  Labs:  Recent Labs  09/13/16 0310 09/13/16 0807 09/14/16 0425 09/15/16 0415  HGB 8.3*  --  8.4* 8.6*  HCT 25.4*  --  26.8* 26.9*  PLT 177  --  222 276  LABPROT 36.7* 35.5* 30.3* 31.4*  INR 3.60 3.44 2.83 2.96  CREATININE 2.40*  --  1.74* 1.32*    Estimated Creatinine Clearance: 57.3 mL/min (by C-G formula based on SCr of 1.32 mg/dL (H)).  Assessment: 65yom on eliquis pta for afib, transitioned to heparin for pericardiocentesis and high risk TAVR same day.   INR goal was initally 2.5 - 3.5 however, per Dr. Shirlee Latch INR goal decreased to 2-3.  INR is up slightly this am to 2.9 after lowering dose to 4mg  last night. Will give 2.5mg  tonight in hopes of patient settling in the 2.5 range.   K low at 3.4 - patient agreeable to try potassium powder packets with applesauce and food. May be able to use tablets if needed but patient reports severe GI issues with tablets.   Goal of Therapy:  INR 2-3 Monitor platelets by anticoagulation protocol: Yes   Plan:  1) Warfarin 2.5mg *1 tonight 2) Daily INR, CBC 3) Warfarin education done 11/2  Sheppard Coil PharmD., BCPS Clinical Pharmacist Pager [phone] 09/15/2016 2:40 PM

## 2016-09-15 NOTE — Clinical Social Work Note (Signed)
CSW acknowledges consult that patient's family wants [facility]. Patient is not appropriate for SNF due to walking 800 feet modified independent. PT recommending no PT follow up.  CSW signing off. Consult again if any social work needs arise.  Charlynn Court, CSW [phone]

## 2016-09-15 NOTE — Progress Notes (Signed)
Patient ID: Lucas Munoz, male   DOB: [birth year], 65 y.o.   MRN: [id number]    SUBJECTIVE:   PICC placed , initial co-ox on 10/14 47%.  He was started on milrinone. Milrinone increased to 0.375 mcg 08/29/16 for low mixed venous sat on RHC (CI 1.9).  Underwent pericardiocentesis on 10/20 with 775cc out. Fluid transudative.   TEE 10/23 confirmed amorphous material in LA appendage, appears to be forming thrombus.  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   Post-TAVR echo 10/25 with EF up to 45%, moderate LVH, bioprosthetic AVR with mean gradient 20 (higher than expected), PASP 37, normal RV, no pericardial effusion.   On 10/26 developed nausea/vomiting and hypotension.  He was started on norepinephrine and IV fluid with low CVP. CXR showed large right pleural effusion accumulation. Chest tube replaced on right, 1300 cc bloody fluid out.  Heparin stopped. He remained hypotensive.  1 unit PRBCs given. Also noted rise in creatinine with poor UOP.  Echo 10/26 with EF about 45%, small pericardial effusion with no tamponade. PCT up to 4.63, possible HCAP.  Suspect septic shock => resolved.   Chest tube removed 10/31.    Creatinine trending down. 2.4>1.7 >1.32   Overall feels good. Denies SOB/Orthopnea.     RHC/LHC (08/29/16) Left Main  Mild ostial narrowing.  Left Anterior Descending  30% proximal LAD stenosis. 60% distal LAD stenosis. Moderate D1 without significant disease. Small to moderate D2 with long 70% ostial stenosis.  Left Circumflex  50% proximal LCx stenosis at OM1. Moderate OM1 with 30% ostial stenosis.  Right Coronary Artery  40% proximal and 40% mid RCA stenosis.   RA mean 19 RV 65/15 PA 69/34, mean 51 PCWP mean 43 (not sure we got an accurate PCWP).  AO 106/87 Oxygen saturations: PA 46% AO 92% Cardiac Output (Fick) 3.8  Cardiac Index (Fick) 1.9  Echo (10/14): EF 15%, severe AS, moderately decreased RV systolic function, PASP 65  mmHg, moderate to large pericardial effusion without tamponade.   Scheduled Meds: . sodium chloride   Intravenous Once  . sodium chloride   Intravenous Once  . acetaminophen (TYLENOL) oral liquid 160 mg/5 mL  650 mg Per Tube Once   Or  . acetaminophen  650 mg Rectal Once  . amiodarone  200 mg Oral BID  . atorvastatin  40 mg Oral q1800  . carvedilol  3.125 mg Oral BID WC  . furosemide  40 mg Oral Daily  . mouth rinse  15 mL Mouth Rinse BID  . piperacillin-tazobactam (ZOSYN)  IV  3.375 g Intravenous Q8H  . polyethylene glycol  17 g Oral Daily  . sodium chloride flush  10-40 mL Intracatheter Q12H  . sodium chloride flush  3 mL Intravenous Q12H  . spironolactone  12.5 mg Oral Daily  . Warfarin - Pharmacist Dosing Inpatient   Does not apply q1800   Continuous Infusions:   PRN Meds:.sodium chloride, Place/Maintain arterial line **AND** sodium chloride, acetaminophen, alum & mag hydroxide-simeth, docusate sodium, famotidine, lactulose, morphine injection, ondansetron (ZOFRAN) IV, oxyCODONE, oxyCODONE-acetaminophen, sodium chloride flush, sodium chloride flush, traMADol, traZODone, white petrolatum   Vitals:   09/14/16 1649 09/14/16 2020 09/15/16 0338 09/15/16 0810  BP: 104/61 (!) 93/56 (!) 111/50 107/62  Pulse: (!) 59 76 78 80  Resp:  18 18   Temp:  98.6 F (37 C) 99 F (37.2 C)   TempSrc:  Oral Oral   SpO2:  97% 94%   Weight:   185  lb 3 oz (84 kg)   Height:        Intake/Output Summary (Last 24 hours) at 09/15/16 0935 Last data filed at 09/15/16 0339  Gross per 24 hour  Intake              480 ml  Output              950 ml  Net             -470 ml    LABS: Basic Metabolic Panel:  Recent Labs  16/10/96 0425 09/15/16 0415  NA 135 137  K 3.5 3.4*  CL 101 103  CO2 28 27  GLUCOSE 98 107*  BUN 34* 25*  CREATININE 1.74* 1.32*  CALCIUM 8.1* 8.0*  PHOS 2.5 2.4*   Liver Function Tests:  Recent Labs  09/14/16 0425 09/15/16 0415  ALBUMIN 2.0* 1.9*   No results  for input(s): LIPASE, AMYLASE in the last 72 hours. CBC:  Recent Labs  09/14/16 0425 09/15/16 0415  WBC 13.0* 13.6*  NEUTROABS 10.1* 11.0*  HGB 8.4* 8.6*  HCT 26.8* 26.9*  MCV 83.5 83.5  PLT 222 276   Cardiac Enzymes: No results for input(s): CKTOTAL, CKMB, CKMBINDEX, TROPONINI in the last 72 hours. BNP: Invalid input(s): POCBNP D-Dimer: No results for input(s): DDIMER in the last 72 hours. Hemoglobin A1C: No results for input(s): HGBA1C in the last 72 hours. Fasting Lipid Panel: No results for input(s): CHOL, HDL, LDLCALC, TRIG, CHOLHDL, LDLDIRECT in the last 72 hours. Thyroid Function Tests: No results for input(s): TSH, T4TOTAL, T3FREE, THYROIDAB in the last 72 hours.  Invalid input(s): FREET3 Anemia Panel: No results for input(s): VITAMINB12, FOLATE, FERRITIN, TIBC, IRON, RETICCTPCT in the last 72 hours.  RADIOLOGY: Dg Chest 2 View  Result Date: 09/14/2016 CLINICAL DATA:  Pleural effusion.  Shortness of breath for 1 week. EXAM: CHEST  2 VIEW COMPARISON:  09/12/2016 FINDINGS: Right arm PICC line tip is at the cavoatrial junction. Moderate cardiac enlargement. Persistent right pleural effusion noted. There is a lucency within the effusion which is new from previous exam. Left lung is clear. IMPRESSION: 1. Persistent right pleural effusion which now contains gas. This may be related to recent removal of right-sided chest tube. Other considerations include development superimposed infection/ empyema. These results will be called to the ordering clinician or representative by the Radiologist Assistant, and communication documented in the PACS or zVision Dashboard. Electronically Signed   By: Signa Kell M.D.   On: 09/14/2016 11:27   US Renal  Result Date: 09/08/2016 CLINICAL DATA:  Acute renal failure EXAM: RENAL / URINARY TRACT ULTRASOUND COMPLETE COMPARISON:  CT abdomen pelvis 04/11/2016 FINDINGS: Right Kidney: Length: 10.8 cm. Negative for obstruction or mass. Limited  evaluation the right kidney due to chest tube and bandages. Left Kidney: Length: 12.2 cm. Echogenicity within normal limits. No mass or hydronephrosis visualized. Bladder: Appears normal for degree of bladder distention. IMPRESSION: Negative renal ultrasound. Electronically Signed   By: Marlan Palau M.D.   On: 09/08/2016 16:09   Ct Cardiac Morph/pulm Vein W/cm&w/o Ca Score  Addendum Date: 09/01/2016   ADDENDUM REPORT: 09/01/2016 17:18 CLINICAL DATA:  Aortic stenosis EXAM: Cardiac TAVR CT TECHNIQUE: The patient was scanned on a Philips 256 scanner. A 120 kV retrospective scan was triggered in the descending thoracic aorta at 111 HU's. Gantry rotation speed was 270 msecs and collimation was .9 mm. No beta blockade or nitro were given. The 3D data set was reconstructed in 5% intervals  of the R-R cycle. Systolic and diastolic phases were analyzed on a dedicated work station using MPR, MIP and VRT modes. The patient received 80 cc of contrast. 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 Heavily calcified Aorta:  Aortic root dilated Sinotubular Junction:  32 mm Ascending Thoracic Aorta:  43 mm Aortic Arch:  30 mm Descending Thoracic Aorta:  26 mm Sinus of Valsalva Measurements: Non-coronary:  33 mm Right -coronary:  31 mm Left -coronary:  32 mm Coronary Artery Height above Annulus: Left Main:  19 mm above annulus Right Coronary:  10 mm above annulus Virtual Basal Annulus Measurements: Maximum/Minimum Diameter:  27.1 x 20.3 mm Perimeter:  78 mm Area:  442 mm2 Coronary Arteries:  Sufficient height above annulus for deployment Optimum Fluoroscopic Angle for Delivery:  LAO 29 degrees IMPRESSION: 1) functionally bicuspid AV heavily calcified with annulus suitable for 26 mm Sapien 3 valve 2) Moderate aortic root enlargement 4.3 cm 3) Large pericardial effusion patient to have pericardiocentesis post CT 4) Cannot r/o LAA thrombus suggest TEE correlation 5) Optimum angiographic angle for  deployment LAO 29 degrees 6) Coronary heights sufficient for deployment Charlton Haws Electronically Signed   By: Charlton Haws M.D.   On: 09/01/2016 17:18   Result Date: 09/01/2016 EXAM: OVER-READ INTERPRETATION  CT CHEST The following report is an over-read performed by radiologist Dr. Royal [facility] on 09/01/2016. This over-read does not include interpretation of cardiac or coronary anatomy or pathology. The coronary calcium score/coronary CTA interpretation by the cardiologist is attached. COMPARISON:  Chest CT 05/01/2016. FINDINGS: Large pericardial effusion. No pericardial calcification. Aortic atherosclerosis with aneurysmal dilatation of the ascending thoracic aorta which measures up to 4.8 cm in diameter (unchanged). Large right-sided pleural effusion layering dependently. Extensive passive atelectasis in the right lower lobe. Tiny calcified granuloma in the periphery of the right lower lobe. 4 mm subpleural noncalcified pulmonary nodule in the left lower lobe abutting the major fissure (image 71 of series 512), unchanged compared to prior study 05/01/2016, favored to represent a subpleural lymph node. No other suspicious appearing pulmonary nodules or masses are noted in the visualized portions of the thorax. Partially calcified pleural plaque in the right hemithorax (image 31 of series 513) again noted. No calcified pleural plaques in the left hemithorax. Visualized portions of the upper abdomen demonstrate a nodular contour of the liver, suggestive of underlying cirrhosis. No aggressive appearing lytic or blastic lesions are noted in the visualized portions of the skeleton. Right upper extremity PICC with tip terminating in the distal superior vena cava. IMPRESSION: 1. Interval increased size of what is now a large pericardial effusion. No associated pericardial calcification. 2. Enlargement of a large layering right-sided pleural effusion. This is associated with extensive  passive atelectasis in the right lower lobe. 3. Aortic atherosclerosis, with similar aneurysmal dilatation of the ascending thoracic aorta which measures up to 4.8 cm in diameter. 4. Morphologic changes in the liver indicative of cirrhosis, as above. 5. 4 mm subpleural nodule in the periphery of the left lower lobe is nonspecific, but statistically likely a subpleural lymph node. No follow-up needed if patient is low-risk. Non-contrast chest CT can be considered in 12 months if patient is high-risk. This recommendation follows the consensus statement: Guidelines for Management of Incidental Pulmonary Nodules Detected on CT Images: From the Fleischner Society 2017; Radiology 2017; 284:228-243. 6. Additional incidental findings, as above. Electronically Signed: By: Trudie Reed M.D. On: 09/01/2016 14:38   Dg Chest [facility]  1 View  Result Date: 09/12/2016 CLINICAL DATA:  Right chest tube removal EXAM: PORTABLE CHEST 1 VIEW COMPARISON:  09/11/2016 FINDINGS: Right chest tube has been removed. There is small right pleural effusion with right basilar atelectasis or infiltrate. No pneumothorax. Right arm PICC line with tip in SVC right atrium junction. IMPRESSION: Small right pleural effusion with right basilar atelectasis or infiltrate. Stable right arm PICC line position. No pneumothorax. Electronically Signed   By: Natasha Mead M.D.   On: 09/12/2016 14:55   Dg Chest Port 1 View  Result Date: 09/11/2016 CLINICAL DATA:  Respiratory failure, shortness of breath, status post aortic valve replacement on October24, 2017. EXAM: PORTABLE CHEST 1 VIEW COMPARISON:  Portable chest x-rays of September 05, 2016 and September 10, 2016. FINDINGS: The left lung is well-expanded and clear. On the right no pneumothorax is evident. There is a small right pleural effusion. The interstitial markings remain prominent especially at the lung base. The right-sided chest tube tip projects over the inferior margin of the posterior aspect of  the right sixth rib. The cardiac silhouette remains enlarged. The pulmonary vascularity is less engorged. There is calcification in the wall of the aortic arch. There degenerative changes of the shoulders and there is an old healed midshaft right clavicular fracture. The right-sided PICC line tip projects over the midportion of the SVC. IMPRESSION: Mild improvement in the pulmonary interstitium overall consistent with decreasing interstitial edema. Persistent small right pleural effusion. The right-sided chest tube is in stable position. Electronically Signed   By: David  [place] M.D.   On: 09/11/2016 07:40   Dg Chest Port 1 View  Result Date: 09/10/2016 CLINICAL DATA:  Chest congestion.  Cough. EXAM: PORTABLE CHEST 1 VIEW COMPARISON:  Yesterday. FINDINGS: Stable enlarged cardiac silhouette and transcatheter aortic valve. The right PICC is unchanged. A right chest tube remains in place with no pneumothorax. The pulmonary vasculature and interstitial markings remain prominent. Thoracic spine degenerative changes. IMPRESSION: Stable cardiomegaly and changes of congestive heart failure. Electronically Signed   By: Beckie Salts M.D.   On: 09/10/2016 08:14   Dg Chest Port 1 View  Result Date: 09/09/2016 CLINICAL DATA:  Status post TAVR and right chest tube placement for hemothorax. EXAM: PORTABLE CHEST 1 VIEW COMPARISON:  09/08/2016 FINDINGS: Right chest tube remains in place with no significant pleural fluid volume. No pneumothorax. There remains atelectasis in the right lower lung. PICC line positioning stable in the distal SVC. Stable cardiac enlargement and appearance of transcatheter aortic valve. Suspect mild residual interstitial edema which appears stable. IMPRESSION: No significant residual right pleural fluid volume with atelectasis remaining in the right lower lung. Stable mild interstitial edema. Electronically Signed   By: Irish Lack M.D.   On: 09/09/2016 09:13   Dg Chest Port 1  View  Result Date: 09/08/2016 CLINICAL DATA:  Shortness of breath.  CHF. EXAM: PORTABLE CHEST 1 VIEW COMPARISON:  09/07/2016. FINDINGS: Right PICC line and right chest tube in stable position. No pneumothorax. Right pleural effusion is improved slightly. Cardiomegaly with diffuse pulmonary interstitial prominence mild congestive heart failure cannot be excluded . IMPRESSION: 1. Right PICC line right chest tube in stable position. No pneumothorax. Right pleural effusion has improved slightly. 2. With bilateral from interstitial prominence. Mild congestive heart failure cannot be excluded . Electronically Signed   By: Maisie Fus  Register   On: 09/08/2016 08:48   Dg Chest Port 1 View  Result Date: 09/07/2016 CLINICAL DATA:  Hemothorax with new right chest tube. EXAM: PORTABLE  CHEST 1 VIEW COMPARISON:  Earlier today FINDINGS: Decreased but still extensive right hemothorax after chest tube placement. No visualized gas component. Right upper extremity PICC with tip at the SVC level. Unchanged cardiopericardial enlargement. Status post transcatheter aortic valve replacement. IMPRESSION: 1. Right chest tube placement with diminished hemothorax. 2. Elsewhere stable chest. Electronically Signed   By: Marnee Spring M.D.   On: 09/07/2016 09:05   Dg Chest Port 1 View  Result Date: 09/07/2016 CLINICAL DATA:  Vomiting EXAM: PORTABLE CHEST 1 VIEW COMPARISON:  Yesterday FINDINGS: Stable right upper extremity PICC. Swan-Ganz catheter and right jugular introducer have been removed. Right chest tube is been removed. A large right pleural effusion has accumulated. There is compressive atelectasis within much of the right lung. Vascular congestion. Left lung is otherwise clear. Cardiomegaly. Small right pneumothorax is suspected. IMPRESSION: Right chest tube removed. Large right pleural effusion has reaccumulated. Very small right apical pneumothorax has also developed. Electronically Signed   By: Jolaine Click M.D.   On:  09/07/2016 07:39   Dg Chest Port 1 View  Result Date: 09/06/2016 CLINICAL DATA:  Cardiac surgery EXAM: PORTABLE CHEST 1 VIEW COMPARISON:  Yesterday FINDINGS: Right upper extremity PICC, right chest tube, right jugular introducer, right Swan-Ganz catheter are stable. There is no pneumothorax. Small right pleural effusion is stable. Vascular congestion and mild edema are worse. Lower lung volumes. Cardiac silhouette remains markedly enlarged. IMPRESSION: Worsening vascular congestion and edema. No pneumothorax. Electronically Signed   By: Jolaine Click M.D.   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 Postoperative image. EXAM: PORTABLE CHEST 1 VIEW COMPARISON:  CT chest 09/01/2016 and single view of the chest 08/26/2016. FINDINGS: Endotracheal tube is in place with tip in good position at the level of the clavicular heads. Right IJ approach Swan-Ganz catheter tip is in the proximal right main pulmonary artery. Tip of right PICC projects in the lower superior vena cava. NG tube courses into the stomach. Right chest tube is identified. There is a small right pleural effusion and basilar atelectasis, improved since the prior chest film. The lungs are otherwise clear. The cardiopericardial silhouette is enlarged. No pneumothorax. IMPRESSION: Support tubes and lines projecting good position. Negative for pneumothorax. Decreased small right pleural effusion and basilar atelectasis. Enlarged cardiopericardial silhouette consistent with cardiomegaly and pericardial effusion as seen on CT scan. Electronically Signed   By: Drusilla Kanner M.D.   On: 09/05/2016 13:56   Dg Chest Port 1 View  Result Date: 08/26/2016 CLINICAL DATA:  64 year old male with a history of shortness of breath EXAM: PORTABLE CHEST 1 VIEW COMPARISON:  08/25/2016, CT chest 05/01/2016 FINDINGS: Compare to the prior plain film there is unchanged size of the  cardiac silhouette. Calcifications of the aortic arch. Interval placement of right upper extremity PICC with the tip appearing to terminate at the superior cavoatrial junction. Increasing opacity at the right base with thickening of the minor fissure, obscuration the right hemidiaphragm and right heart border. Interlobular septal thickening. IMPRESSION: Evidence of CHF with increasing right-sided pleural effusion/atelectasis. Unchanged cardiac silhouette in this patient with a history of pericardial effusion imaged on prior CT 05/01/2016. Interval placement of right upper extremity PICC. Aortic atherosclerosis. Signed, Yvone Neu. Loreta Ave, DO [facility] Electronically Signed   By: Gilmer Mor D.O.   On: 08/26/2016 09:37   Dg Chest Port 1 View  Result Date: 08/25/2016 CLINICAL  DATA:  Acute on chronic systolic congestive heart failure. Dilated cardiomyopathy. Acute kidney injury. EXAM: PORTABLE CHEST 1 VIEW COMPARISON:  None. FINDINGS: Marked cardiac enlargement noted. Diffuse interstitial infiltrates consistent with mild interstitial edema. Small right pleural effusion also seen with right basilar atelectasis. IMPRESSION: Mild congestive heart failure, with small right pleural effusion and right basilar atelectasis. Electronically Signed   By: Myles RosenthalJohn  Stahl M.D.   On: 08/25/2016 15:14   Ct Angio Chest/abd/pel For Dissection W And/or W/wo  Result Date: 09/03/2016 CLINICAL DATA:  65 year old male with history of severe aortic stenosis. Preprocedural study prior to potential transcatheter aortic valve replacement. Recent history of pericardiocentesis. EXAM: CT ANGIOGRAPHY CHEST, ABDOMEN AND PELVIS TECHNIQUE: Multidetector CT imaging through the chest, abdomen and pelvis was performed using the standard protocol during bolus administration of intravenous contrast. Multiplanar reconstructed images and MIPs were obtained and reviewed to evaluate the vascular  anatomy. CONTRAST:  80 mL of Isovue 370. COMPARISON:  Cardiac gated CTA 09/01/2016. FINDINGS: CTA CHEST FINDINGS Cardiovascular: Heart size is mildly enlarged. Small amount of residual pericardial fluid and/or thickening, decreased compared to the prior study. Pericardiocentesis catheter in place with tip terminating high in a superior pericardial recess adjacent to the aortic arch. No pericardial calcification. Severe thickening calcification of the aortic valve, which has an appearance suggestive of a bicuspid valve. There is aortic atherosclerosis, as well as atherosclerosis of the great vessels of the mediastinum and the coronary arteries, including calcified atherosclerotic plaque in the left anterior descending and right coronary arteries. 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 Large filling defect within the left atrial appendage, concerning for potential left atrial appendage thrombus. Right upper extremity PICC with tip terminating at the superior cavoatrial junction. Mediastinum/Lymph Nodes: Several borderline enlarged and minimally enlarged mediastinal lymph nodes measuring up to 14 mm in short axis, slightly increased compared to the prior study, likely reactive. Esophagus is unremarkable in appearance. No axillary lymphadenopathy. Lungs/Pleura: Large right pleural effusion again noted, lying dependently. This is associated with near complete passive atelectasis of the right lower lobe. Trace left pleural effusion with minimal passive subsegmental atelectasis in the dependent left lower lobe. No acute consolidative airspace disease. Previously noted tiny subpleural known nodule in the anterior aspect of the left lower lobe is no longer noted, presumably a reactive subpleural lymph node on the prior examination. Partially calcified small right-sided pleural plaque is unchanged (image 63 of series 5). No other calcified pleural plaques are identified. Tiny calcified  granulomas are again noted in the periphery of the right lower lobe, now with an atelectatic lung. Musculoskeletal/Soft Tissues: There are no aggressive appearing lytic or blastic lesions noted in the visualized portions of the skeleton. CTA ABDOMEN AND PELVIS FINDINGS Hepatobiliary: Liver has a shrunken appearance and nodular contour, compatible with underlying cirrhosis. No discrete cystic or solid hepatic lesions. No intra or extrahepatic biliary ductal dilatation. Large rim calcified gallstone in the gallbladder measuring up to 3.8 cm in diameter. No current findings to suggest an acute cholecystitis at this time. Pancreas: No pancreatic mass or peripancreatic inflammatory changes. No pancreatic ductal dilatation. Pancreatic atrophy. No pancreatic or peripancreatic fluid. Spleen: Unremarkable. Adrenals/Urinary Tract: Bilateral kidneys and bilateral adrenal glands are unremarkable in appearance. No hydroureteronephrosis. In the dependent portion of the urinary bladder there is a 12 mm calculus (image 254 of series 5). Urinary bladder is otherwise unremarkable in appearance. Stomach/Bowel: Normal appearance of the stomach. No pathologic dilatation of small bowel or colon. Numerous colonic diverticulae  are noted, without surrounding inflammatory changes to suggest an acute diverticulitis at this time. The appendix is not confidently identified and may be surgically absent. Regardless, there are no inflammatory changes noted adjacent to the cecum to suggest the presence of an acute appendicitis at this time. Vascular/Lymphatic: Aortic atherosclerosis with vascular findings and measurements pertinent to potential TAVR procedure, as detailed below. No aneurysm or dissection identified in the abdominal or pelvic vasculature. Celiac axis, superior mesenteric artery and inferior mesenteric artery are all widely patent without hemodynamically significant stenosis. Single renal arteries bilaterally are widely patent.  Portal vein is mildly dilated measuring 16 mm in the porta hepatis. Reproductive: Prostate gland and seminal vesicles are unremarkable in appearance. Other: Small left inguinal hernia containing a short segment of small bowel. No significant volume of ascites. No pneumoperitoneum. Musculoskeletal: There are no aggressive appearing lytic or blastic lesions noted in the visualized portions of the skeleton. VASCULAR MEASUREMENTS PERTINENT TO TAVR: AORTA: Minimal Aortic Diameter -  18 x 14 mm Severity of Aortic Calcification -  moderate RIGHT PELVIS: Right Common Iliac Artery - Minimal Diameter - 11.0 x 9.1 mm Tortuosity - moderate Calcification - mild to moderate Right External Iliac Artery - Minimal Diameter - 8.7 x 8.9 mm Tortuosity - moderate to severe Calcification - none Right Common Femoral Artery - Minimal Diameter - 9.2 x 6.8 mm Tortuosity - mild Calcification - mild LEFT PELVIS: Left Common Iliac Artery - Minimal Diameter - 9.5 x 10.0 mm Tortuosity - severe Calcification - none Left External Iliac Artery - Minimal Diameter - 8.1 x 7.9 mm Tortuosity - moderate Calcification - none Left Common Femoral Artery - Minimal Diameter - 8.5 x 6.3 mm Tortuosity - mild Calcification - mild Review of the MIP images confirms the above findings. IMPRESSION: 1. Vascular findings and measurements pertinent to potential TAVR procedure, as detailed above. This patient appears to have suitable pelvic arterial access, however, this access is most optimal on the right side secondary to the lesser degree of tortuosity. Additionally, there is a small left inguinal hernia containing a short segment of small bowel which comes in close proximity to the left common femoral artery. 2. Severe thickening calcification of what appears to be a bicuspid aortic valve, compatible with the reported clinical history of severe aortic stenosis. 3. Interval pericardiocentesis with persistent indwelling pericardiocentesis catheter, with significant  decreased size of what is now only a small amount of residual pericardial fluid and/or thickening. 4. Large filling defect in the tip of the left atrial appendage, concerning for potential left atrial appendage thrombus (although this could simply reflect pseudo thrombus secondary to contrast bolus timing). Correlation with TEE is recommended, if not already performed. 5. Aortic atherosclerosis, in addition to 2 vessel coronary artery disease. Please note that although the presence of coronary artery calcium documents the presence of coronary artery disease, the severity of this disease and any potential stenosis cannot be assessed on this non-gated CT examination. Assessment for potential risk factor modification, dietary therapy or pharmacologic therapy may be warranted, if clinically indicated. 6. Persistent large right-sided pleural effusion with extensive passive atelectasis in the right lower lobe. Trace left pleural effusion with minimal dependent passive atelectasis in the left lower lobe. 7. Cholelithiasis without evidence of acute cholecystitis. 8. Colonic diverticulosis without evidence of acute diverticulitis at this time. 9. Morphologic changes in the liver compatible with underlying cirrhosis. 10. Additional incidental findings, as above. Electronically Signed   By: Trudie Reed M.D.   On: 09/03/2016 12:24  PHYSICAL EXAM General: NAD. In the bed.  Neck: JVP 5-6 Lungs: Decreased RML RLL.  CV: Nondisplaced PMI.  Heart irregular S1/S2, no S3/S4, 2/6 SEM RUSB. R and LLE trace edema.   Abdomen: Soft, NT, ND, no HSM. No bruits or masses. +BS  Neurologic: Alert and oriented x 3.  Psych: Normal affect. Extremities: No clubbing or cyanosis. RUE PICC R and LLE ted hose.   TELEMETRY: Reviewed, A fib 80s-90s  ASSESSMENT AND PLAN: 65 y/o ?with a h/o severe AS, LV dysfxn, HTN, noncompliance, thyroid nodules, pericardial effusion, cholelithiasis, and persistent AF on eliquis, who presentedto  clinic on 08/23/16 with volume overload and was admitted for further work up of AS and CHF.  1. Acute on chronic systolic CHF: EF 16-10% in setting of severe aortic stenosis.  May be due to severe aortic stenosis given lack of flow-limiting CAD.  This admission, he was initially hypotensive with SBP in 80s though BP has improved to 100s-110s range.  He was also markedly volume overloaded. PICC was placed, low output confirmed by co-ox 47%.  Milrinone 0.25 started then increased to 0.375, now weaned off.  Repeat echo post-TAVR with EF up to 45%.  Repeat limited echo 10/26 with small pericardial effusion, no tamponade.  Developed suspected septic shock most recently, off pressors.  Today CO-OX 44%. Repeat now. Asymptomatic - Lasix 40 mg daily.    - Renal function improving. Restart spironolactone 12.5 daily.  - Continue Coreg 3.125 mg bid. May need to stop if CO-OX low.  2. Hypotension:  Pleural effusion drained, no pericardial tamponade, EF up to 45% and valve functioning ok (mildly elevated gradient).  Co-ox has been ok.  Suspect septic shock with rise in WBCs and high procalcitonin though no fever.  ?Right-sided PNA.  Blood cultures no growth so far.  Added empiric antibiotics with vancomycin/Zosyn => now off vanco, continue day 8 Zosyn .  Hydrocortisone added by CCM.  Now weaned off pressors/hydrocortisone.   3. Aortic stenosis: Severe AS. 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 McAlhany and [place]. - Bioprosthetic valve stable on post-op echo though mean gradient a bit high at 20 mmHg.  4. AKI on CKD stage III: Baseline creatinine 1.7-1.8. Creatinine starting to trend down. Today, creatinine trending down to 1.32 with resolution of septic shock.    5. Atrial fibrillation: Persistent, with RVR this admission.  He has been in atrial fibrillation at least since 6/17.  No prior DCCV. Not sure how long prior to 6/17 he was in atrial fibrillation.  - Continue amiodarone for rate control, now po.  Eventually would like to  cardiovert. - He had developing thrombus on TEE => heparin held with bleeding into his right chest.   - With LAA thrombus despite Eliquis, will use Coumadin for home. Goal INR 2-3, supratherapeutic today. Todays INR 2.83 - Will not be able to do DCCV at this point with atrial thrombus => repeat TEE with possible DCCV in 1 month.  6. Pericardial effusion: Moderate to large, no tamponade.  s/p pericardiocentesis with Dr Clifton James 10/20. Pericardial drain removed 09/05/16.  Repeat echo 10/27 with small effusion, no tamponade.   7. Cirrhosis: Noted on prior abdominal CT, LFTs improved with diuresis.  8. CAD:  LHC with moderate coronary disease, nothing flow-limiting.  - Stop ASA with therapeutic INR. INR 2.96  9. Hyponatremia: Fluid restrict. Sodium improving. 10. Hemothorax: On right. Chest tube now out.  CXR reviewed by Dr Laneta Simmers. Plan to repeat CXR in a couple of weeks.  11.  Anemia: Hemoglobin stable, 8.6. Fe studies looked ok.  Follow closely.   Amy Clegg NP-C  09/15/2016 9:35 AM    Patient seen and examined with Tonye Becket, NP. We discussed all aspects of the encounter. I agree with the assessment and plan as stated above.   Looks better but co-ox remains low. Will complete abx today. Volume status ok. INR 2.8 today. Tolerating AF ok. Possibly home tomorrow.   Breeann Reposa,MD 4:55 PM

## 2016-09-15 NOTE — Progress Notes (Signed)
CARDIAC REHAB PHASE I   PRE:  Rate/Rhythm: 90 afib    BP: sitting 119/59    SaO2: 97 RA  MODE:  Ambulation: 890 ft   POST:  Rate/Rhythm: 119 afib    BP: sitting 143/80     SaO2: 97 RA  Pt moving well. Able to walk independently with RW. Rested x3 for SOB. Would like RW for home. VSS, return to recliner. It sounds like he does not have any family to help at home. Does have a neighbor he can rely on for groceries. [phone]   Harriet Masson CES, ACSM 09/15/2016 11:37 AM

## 2016-09-15 NOTE — Progress Notes (Signed)
Blood pressure was 86/52. PA was notified and advised to hold Carvedilol for now. Will continue to monitor his blood pressure.

## 2016-09-15 NOTE — Care Management Important Message (Signed)
Important Message  Patient Details  Name: Lucas Munoz MRN: [id number] Date of Birth: [birth year]   Medicare Important Message Given:  Yes    Elaisha Zahniser Abena 09/15/2016, 10:27 AM

## 2016-09-16 DIAGNOSIS — Z23 Encounter for immunization: Secondary | ICD-10-CM | POA: Diagnosis not present

## 2016-09-16 LAB — CBC WITH DIFFERENTIAL/PLATELET
Basophils Absolute: 0 10*3/uL (ref 0.0–0.1)
Basophils Relative: 0 %
EOS PCT: 2 %
Eosinophils Absolute: 0.2 10*3/uL (ref 0.0–0.7)
HEMATOCRIT: 27.3 % — AB (ref 39.0–52.0)
Hemoglobin: 8.5 g/dL — ABNORMAL LOW (ref 13.0–17.0)
LYMPHS ABS: 0.6 10*3/uL — AB (ref 0.7–4.0)
LYMPHS PCT: 5 %
MCH: 26.7 pg (ref 26.0–34.0)
MCHC: 31.1 g/dL (ref 30.0–36.0)
MCV: 85.8 fL (ref 78.0–100.0)
MONO ABS: 2 10*3/uL — AB (ref 0.1–1.0)
Monocytes Relative: 18 %
NEUTROS ABS: 8.3 10*3/uL — AB (ref 1.7–7.7)
Neutrophils Relative %: 75 %
PLATELETS: 305 10*3/uL (ref 150–400)
RBC: 3.18 MIL/uL — AB (ref 4.22–5.81)
RDW: 20.7 % — AB (ref 11.5–15.5)
WBC: 11.1 10*3/uL — ABNORMAL HIGH (ref 4.0–10.5)

## 2016-09-16 LAB — PROTIME-INR
INR: 3.07
PROTHROMBIN TIME: 32.4 s — AB (ref 11.4–15.2)

## 2016-09-16 LAB — RENAL FUNCTION PANEL
Albumin: 1.8 g/dL — ABNORMAL LOW (ref 3.5–5.0)
Anion gap: 6 (ref 5–15)
BUN: 16 mg/dL (ref 6–20)
CHLORIDE: 102 mmol/L (ref 101–111)
CO2: 28 mmol/L (ref 22–32)
Calcium: 7.9 mg/dL — ABNORMAL LOW (ref 8.9–10.3)
Creatinine, Ser: 1.08 mg/dL (ref 0.61–1.24)
GFR calc Af Amer: >60 mL/min (ref >60)
GFR calc non Af Amer: >60 mL/min (ref >60)
GLUCOSE: 94 mg/dL (ref 65–99)
POTASSIUM: 3.5 mmol/L (ref 3.5–5.1)
Phosphorus: 2.6 mg/dL (ref 2.5–4.6)
Sodium: 136 mmol/L (ref 135–145)

## 2016-09-16 LAB — COOXEMETRY PANEL
CARBOXYHEMOGLOBIN: 2 % — AB (ref 0.5–1.5)
METHEMOGLOBIN: 0.8 % (ref 0.0–1.5)
O2 SAT: 50.5 %
TOTAL HEMOGLOBIN: 8.9 g/dL — AB (ref 12.0–16.0)

## 2016-09-16 MED ORDER — AMIODARONE HCL 200 MG PO TABS: 200.0000 mg | ORAL_TABLET | Freq: Two times a day (BID) | ORAL | 6 refills | Status: DC

## 2016-09-16 MED ORDER — ATORVASTATIN CALCIUM 40 MG PO TABS: 40.0000 mg | ORAL_TABLET | Freq: Every day | ORAL | 6 refills | Status: DC

## 2016-09-16 MED ORDER — ONDANSETRON HCL 4 MG PO TABS: 4.0000 mg | ORAL_TABLET | Freq: Three times a day (TID) | ORAL | 0 refills | Status: AC | PRN

## 2016-09-16 MED ORDER — FUROSEMIDE 40 MG PO TABS: 40.0000 mg | ORAL_TABLET | Freq: Every day | ORAL | 6 refills | Status: DC

## 2016-09-16 MED ORDER — WARFARIN SODIUM 2 MG PO TABS: 2.0000 mg | ORAL_TABLET | Freq: Every day | ORAL | 2 refills | Status: DC

## 2016-09-16 MED ORDER — SPIRONOLACTONE 25 MG PO TABS: 25.0000 mg | ORAL_TABLET | Freq: Every day | ORAL | 6 refills | Status: DC

## 2016-09-16 NOTE — Discharge Summary (Signed)
Discharge Summary    Patient ID: Lucas Munoz,  MRN: [id number], DOB/AGE: [birth year] 67 y.o.  Admit date: 08/24/2016 Discharge date: 09/16/2016  Primary Care Provider: Junious Dresser D. Primary Cardiologist: Dr.Kelly CHF: Dr. Shirlee Latch    Discharge Diagnoses    Active Problems:   Acute on chronic systolic CHF (congestive heart failure) (HCC)   DCM (dilated cardiomyopathy) (HCC)   Acute kidney injury superimposed on CKD [facility])   Palliative care encounter   Systolic congestive heart failure (HCC)   CHF (congestive heart failure) (HCC)   Nonrheumatic aortic valve stenosis   S/P TAVR (transcatheter aortic valve replacement)   Allergies Allergies  Allergen Reactions  . Potassium-Containing Compounds Other (See Comments)    Upset stomach    Diagnostic Studies/Procedures   Echo 08/26/16 LV EF: 15%  ------------------------------------------------------------------- Indications:      CHF - 428.0.  ------------------------------------------------------------------- History:   PMH:  AKI on CKD stage III. Atrial fibrillation with RVR. Medical noncompliance.  ------------------------------------------------------------------- Study Conclusions  - Left ventricle: The cavity size was normal. Wall thickness was   increased in a pattern of moderate LVH. The estimated ejection   fraction was 15%. Diffuse hypokinesis. Indeterminant diastolic   function (atrial fibrillation). - Aortic valve: Trileaflet; severely calcified leaflets. There was   severe stenosis. There was trivial regurgitation. Mean gradient   (S): 46 mm Hg. Valve area (VTI): 0.51 cm^2. - Aorta: Dilated ascending aorta, 4.3 cm. - Mitral valve: Mildly calcified annulus. There was mild   regurgitation. - Left atrium: The atrium was moderately to severely dilated. - Right ventricle: The cavity size was normal. Systolic function   was moderately reduced. - Right atrium: The atrium was moderately dilated. -  Tricuspid valve: There was moderate regurgitation. Peak RV-RA   gradient (S): 50 mm Hg. - Pulmonary arteries: PA peak pressure: 65 mm Hg (S). - Systemic veins: IVC measured 2.4 cm with < 50% respirophasic   variation, suggesting RA pressure 15 mmHg. - Pericardium, extracardiac: Moderate to large circumferential   pericardial effusion without tamponade.  Impressions:  - The patient was in atrial fibrillation. Normal LV size with   moderate LV hypertrophy. EF 15%, diffuse hypokinesis. Normal RV   size with moderately decreased systolic function. Biatrial   enlargement. Moderate TR, mild MR. There was severe aortic   stenosis. Moderate pulmonary hypertension. There was a moderate   to large circumferential pericardial effusion without evidence   for tamponade.  RHC/LHC (08/29/16) Left Main  Mild ostial narrowing.  Left Anterior Descending  30% proximal LAD stenosis. 60% distal LAD stenosis. Moderate D1 without significant disease. Small to moderate D2 with long 70% ostial stenosis.  Left Circumflex  50% proximal LCx stenosis at OM1. Moderate OM1 with 30% ostial stenosis.  Right Coronary Artery  40% proximal and 40% mid RCA stenosis.   RA mean 19 RV 65/15 PA 69/34, mean 51 PCWP mean 43 (not sure we got an accurate PCWP).  AO 106/87 Oxygen saturations: PA 46% AO 92% Cardiac Output (Fick) 3.8  Cardiac Index (Fick) 1.9  TEE 09/04/16 Study Conclusions  - Left ventricle: The cavity size was normal. Wall thickness was   increased in a pattern of mild LVH. The estimated ejection   fraction was 20%. Diffuse hypokinesis. - Aortic valve: Probably trileaflet; severely calcified leaflets.   There was severe stenosis. There was trivial regurgitation. Mean   gradient (S): 52 mm Hg. - Aorta: 4.4 cm ascending aorta. Grade III plaque in descending   thoracic aorta. -  Mitral valve: There was trivial regurgitation. - Left atrium: The atrium was moderately dilated. There was not a    discrete formed thrombus in the LA appendage but there was a   large amount of amorphous material suggesting forming/developing   thrombus. - Right ventricle: The cavity size was normal. Systolic function   was mildly to moderately reduced. - Right atrium: The atrium was mildly dilated. - Atrial septum: Possible small PFO seen by color doppler. - Tricuspid valve: Peak RV-RA gradient 26 mmHg. - Pericardium, extracardiac: Small lateral pericardial effusion.  Impressions:  - Significant amount of amorphous thrombus-in-formation in the left   atrial appendage.  TEE 09/05/16 Study Conclusions  - Impressions: Pre-TAVR: LVE EF 20-25% lateral wall moves best. No   pericardial effusion post pericardiocentesis. Mild MR. Mild RV   dilatation with swan catheter seen. LAA with thrombus and dense   smoke throughout. AV severely calcified Severe AS peak velocity   52m/sec mean gradient 68 mmHg peak 104 mmhG AVA .59 cm2 with mild   AR.     Post TAVR: EF improved 30-35% . No aortic root trauma remains   moderately dilated. No effusion. MR remains mild. 26 mm Sapien 3   valve well positioned with two mild peri valvular jets of AR one   at 1:00 and one at 6:00 on SA images. peak velocity 1.8 m/sec   mean gradient 8 mmHg peak gradient   13 mmHg and AVA 1.98 cm2. LAA seemed to have less dense smoke and   no formed thrombus.  Impressions:  - Pre-TAVR: LVE EF 20-25% lateral wall moves best. No pericardial   effusion post pericardiocentesis. Mild MR. Mild RV dilatation   with swan catheter seen. LAA with thrombus and dense smoke   throughout. AV severely calcified Severe AS peak velocity 78m/sec   mean gradient 68 mmHg peak 104 mmhG AVA .59 cm2 with mild AR.     Post TAVR: EF improved 30-35% . No aortic root trauma remains   moderately dilated. No effusion. MR remains mild. 26 mm Sapien 3   valve well positioned with two mild peri valvular jets of AR one   at 1:00 and one at 6:00 on SA  images. peak velocity 1.8 m/sec   mean gradient 8 mmHg peak gradient   13 mmHg and AVA 1.98 cm2. LAA seemed to have less dense smoke and   no formed thrombus.  Limited Echo 09/06/16 Study Conclusions  - Left ventricle: Overall LVF appears improved with EF at least   45%. Endocardial segments are not well visualized in all views   but appears to be apical inferior akinesis. Recommend limited   echo with definity contrast for better assessment. The cavity   size was normal. There was moderate concentric hypertrophy. There   is akinesis of the apicalinferior myocardium. Doppler parameters   are consistent with high ventricular filling pressure. - Ventricular septum: Septal motion showed moderate paradox. These   changes are consistent with intraventricular conduction delay. - Aortic valve: S/P TAVR with normally functioning bioprosthetic   AVR. The mean AV gradient is elevated at . There is a   trivial perivalvular leak. A bioprosthesis was present. There was   moderate stenosis. Valve area (VTI): 1.22 cm^2. Valve area   (Vmax): 1.17 cm^2. Valve area (Vmean): 1.22 cm^2. - Mitral valve: Moderately calcified annulus. - Atrial septum: There was increased thickness of the septum,   consistent with lipomatous hypertrophy. - Pulmonary arteries: PA peak pressure: 37 mm Hg (S).  Impressions:  - The right ventricular systolic pressure was increased consistent   with mild pulmonary hypertension.  Recommendations:  Recommend limited echo with definity contrast for better assessment.   Limited Echo 09/07/16 Study Conclusions  - Left ventricle: The cavity size was normal. Systolic function was   normal. The estimated ejection fraction was in the range of 50%   to 55%. There is hypokinesis of the apical myocardium. - Aortic valve: A bioprosthesis was present. - Mitral valve: Calcified annulus. - Pericardium, extracardiac: A small pericardial effusion was    identified.  Impressions:  - Limited study s/p TAVR to R/O percardial effusion; apical   hypokinesis with overall low normal LV systolic function; s/p   TAVR with mean gradient 22 mmHg; no AI; small, posterolateral   pericardial effusion; no RV diastolic collapse; IVC normal size   with normal collapse with inspiration   History of Present Illness     66 y/o ?with a h/o severe AS, LV dysfxn, HTN, noncompliance, thyroid nodules, pericardial effusion, cholelithiasis, and persistent AF on eliquis, who presented to clinic on 08/23/16 with volume overload and was admitted for further work up of AS and CHF.    Hospital Course     Consultants: Palliative care, CTCA, Critical care  Admitted with cardiogenic shock with low EF and severe AS. PICC placed , initial co-ox on 10/14 47%.  He was started on milrinone. Found to have large pericardial effusion. Underwent pericardiocentesis on 10/20 with 775cc out. Fluid transudative.  TEE 10/23 confirmed amorphous material in LA appendage, appears to be forming thrombus. 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  Post-TAVR echo 10/25 with EF up to 45%, moderate LVH, bioprosthetic AVR with mean gradient 20 (higher than expected), PASP 37, normal RV, no pericardial effusion.  On 10/26 developed nausea/vomiting and hypotension.  He was started on norepinephrine and IV fluid with low CVP. CXR showed large right pleural effusion accumulation. Chest tube replaced on right, 1300 cc bloody fluid out.  Heparin stopped.  1 unit PRBCs given. Also noted rise in creatinine with poor UOP.  Echo 10/26 with EF about 45%, small pericardial effusion with no tamponade. PCT up to 4.63, possible HCAP.  Suspect septic shock => resolved.  Chest tube removed 10/31.  Creatinine trending down. 2.4>1.7 >1.32> 1.08  1. Acute on chronic systolic CHF: EF 37-94% in setting of severe aortic stenosis. May be due to severe aortic stenosis given lack of  flow-limiting CAD. This admission, he was initially hypotensive with SBP in 80s though BP has improved to 100s-110s range. He was also markedly volume overloaded. PICC was placed, low output confirmed by co-ox 47%.  Milrinone 0.25 started then increased to 0.375, now weaned off.  Repeat echo post-TAVR with EF up to 45%.  Repeat limited echo 10/26 with small pericardial effusion, no tamponade.  Developed suspected septic shock most recently, off pressors.  09/16/16 CO-OX 44%.  Asymptomatic  2. Hypotension:  Pleural effusion drained, no pericardial tamponade, EF up to 45% and valve functioning ok (mildly elevated gradient).  Co-ox has been ok.  Suspect septic shock with rise in WBCs and high procalcitonin though no fever.  ?Right-sided PNA.  Blood cultures no growth so far.  Added empiric antibiotics with vancomycin/Zosyn => now off vanco, continue day 8 Zosyn .  Hydrocortisone added by CCM.  Now weaned off pressors/hydrocortisone.    3. Aortic stenosis: Severe AS. 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 McAlhany and [place]. - Bioprosthetic valve stable on  post-op echo though mean gradient a bit high at 20 mmHg.   4. AKI on CKD stage III: Baseline creatinine 1.7-1.8.  - creatinine back own to 1.08 with resolution of septic shock.     5. Atrial fibrillation: Persistent, with RVR this admission. He has been in atrial fibrillation at least since 6/17. No prior DCCV. Not sure how long prior to 6/17 he was in atrial fibrillation.  - Continue amiodarone for rate control, now po.  Eventually would like to cardiovert. - He had developing thrombus on TEE => heparin held with bleeding into his right chest.   - With LAA thrombus despite Eliquis, will use Coumadin for home. Goal INR 2-3, 3.07 today - Will not be able to do DCCV at this point with atrial thrombus => repeat TEE with possible DCCV in 1 month .  6. Pericardial effusion: Moderate to large, no tamponade.  s/p pericardiocentesis with Dr Clifton James 10/20.  Pericardial drain removed 09/05/16.  Repeat echo 10/27 with small effusion, no tamponade.    7. Cirrhosis: Noted on prior abdominal CT, LFTs improved with diuresis.   8. CAD:  LHC with moderate coronary disease, nothing flow-limiting.  - off ASA with therapeutic INR.    9. Hyponatremia: resolved  10. Hemothorax: On right. Chest tube now out.  CXR reviewed by Dr Laneta Simmers. Plan to repeat CXR in a couple of weeks.   11. Anemia: Hemoglobin stable, 8.5. Fe studies looked ok.  Follow closely.   Discharge medications Lasix 40 daily Spiro 25 Amio 200 bid Warfarin Atorva 40  Will hold coreg 3.125BID due to low BP. F/u closely as outpatient.   The patient has been seen by Dr. Wonda Cheng today and deemed ready for discharge home. All follow-up appointments have been scheduled. Discharge medications are listed below.  _____________   Discharge Vitals Blood pressure 115/72, pulse 69, temperature 98.2 F (36.8 C), temperature source Oral, resp. rate 18, height 5\' 8"  (1.727 m), weight 184 lb 4.9 oz (83.6 kg), SpO2 98 %.  Filed Weights   09/14/16 0504 09/15/16 0338 09/16/16 0511  Weight: 185 lb 11.2 oz (84.2 kg) 185 lb 3 oz (84 kg) 184 lb 4.9 oz (83.6 kg)    Labs & Radiologic Studies     CBC  Recent Labs  09/15/16 0415 09/16/16 0500  WBC 13.6* 11.1*  NEUTROABS 11.0* 8.3*  HGB 8.6* 8.5*  HCT 26.9* 27.3*  MCV 83.5 85.8  PLT 276 305   Basic Metabolic Panel  Recent Labs  09/15/16 0415 09/16/16 0500  NA 137 136  K 3.4* 3.5  CL 103 102  CO2 27 28  GLUCOSE 107* 94  BUN 25* 16  CREATININE 1.32* 1.08  CALCIUM 8.0* 7.9*  PHOS 2.4* 2.6   Liver Function Tests  Recent Labs  09/15/16 0415 09/16/16 0500  ALBUMIN 1.9* 1.8*   No results for input(s): LIPASE, AMYLASE in the last 72 hours. Cardiac Enzymes No results for input(s): CKTOTAL, CKMB, CKMBINDEX, TROPONINI in the last 72 hours. BNP Invalid input(s): POCBNP D-Dimer No results for input(s): DDIMER in the last 72  hours. Hemoglobin A1C No results for input(s): HGBA1C in the last 72 hours. Fasting Lipid Panel No results for input(s): CHOL, HDL, LDLCALC, TRIG, CHOLHDL, LDLDIRECT in the last 72 hours. Thyroid Function Tests No results for input(s): TSH, T4TOTAL, T3FREE, THYROIDAB in the last 72 hours.  Invalid input(s): FREET3  Dg Chest 2 View  Result Date: 09/14/2016 CLINICAL DATA:  Pleural effusion.  Shortness of breath for 1 week.  EXAM: CHEST  2 VIEW COMPARISON:  09/12/2016 FINDINGS: Right arm PICC line tip is at the cavoatrial junction. Moderate cardiac enlargement. Persistent right pleural effusion noted. There is a lucency within the effusion which is new from previous exam. Left lung is clear. IMPRESSION: 1. Persistent right pleural effusion which now contains gas. This may be related to recent removal of right-sided chest tube. Other considerations include development superimposed infection/ empyema. These results will be called to the ordering clinician or representative by the Radiologist Assistant, and communication documented in the PACS or zVision Dashboard. Electronically Signed   By: Signa Kell M.D.   On: 09/14/2016 11:27   US Renal  Result Date: 09/08/2016 CLINICAL DATA:  Acute renal failure EXAM: RENAL / URINARY TRACT ULTRASOUND COMPLETE COMPARISON:  CT abdomen pelvis 04/11/2016 FINDINGS: Right Kidney: Length: 10.8 cm. Negative for obstruction or mass. Limited evaluation the right kidney due to chest tube and bandages. Left Kidney: Length: 12.2 cm. Echogenicity within normal limits. No mass or hydronephrosis visualized. Bladder: Appears normal for degree of bladder distention. IMPRESSION: Negative renal ultrasound. Electronically Signed   By: Marlan Palau M.D.   On: 09/08/2016 16:09   Ct Cardiac Morph/pulm Vein W/cm&w/o Ca Score  Addendum Date: 09/01/2016   ADDENDUM REPORT: 09/01/2016 17:18 CLINICAL DATA:  Aortic stenosis EXAM: Cardiac TAVR CT TECHNIQUE: The patient was scanned on a  Philips [id number] scanner. A 120 kV retrospective scan was triggered in the descending thoracic aorta at 111 HU's. Gantry rotation speed was 270 msecs and collimation was .9 mm. No beta blockade or nitro were given. The 3D data set was reconstructed in 5% intervals of the R-R cycle. Systolic and diastolic phases were analyzed on a dedicated work station using MPR, MIP and VRT modes. The patient received 80 cc of contrast. 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 Heavily calcified Aorta:  Aortic root dilated Sinotubular Junction:  32 mm Ascending Thoracic Aorta:  43 mm Aortic Arch:  30 mm Descending Thoracic Aorta:  26 mm Sinus of Valsalva Measurements: Non-coronary:  33 mm Right -coronary:  31 mm Left -coronary:  32 mm Coronary Artery Height above Annulus: Left Main:  19 mm above annulus Right Coronary:  10 mm above annulus Virtual Basal Annulus Measurements: Maximum/Minimum Diameter:  27.1 x 20.3 mm Perimeter:  78 mm Area:  442 mm2 Coronary Arteries:  Sufficient height above annulus for deployment Optimum Fluoroscopic Angle for Delivery:  LAO 29 degrees IMPRESSION: 1) functionally bicuspid AV heavily calcified with annulus suitable for 26 mm Sapien 3 valve 2) Moderate aortic root enlargement 4.3 cm 3) Large pericardial effusion patient to have pericardiocentesis post CT 4) Cannot r/o LAA thrombus suggest TEE correlation 5) Optimum angiographic angle for deployment LAO 29 degrees 6) Coronary heights sufficient for deployment Charlton Haws Electronically Signed   By: Charlton Haws M.D.   On: 09/01/2016 17:18   Result Date: 09/01/2016 EXAM: OVER-READ INTERPRETATION  CT CHEST The following report is an over-read performed by radiologist Dr. Royal Piedra [facility] on 09/01/2016. This over-read does not include interpretation of cardiac or coronary anatomy or pathology. The coronary calcium score/coronary CTA interpretation by the cardiologist is attached. COMPARISON:   Chest CT 05/01/2016. FINDINGS: Large pericardial effusion. No pericardial calcification. Aortic atherosclerosis with aneurysmal dilatation of the ascending thoracic aorta which measures up to 4.8 cm in diameter (unchanged). Large right-sided pleural effusion layering dependently. Extensive passive atelectasis in the right lower lobe. Tiny calcified granuloma in the periphery of the  right lower lobe. 4 mm subpleural noncalcified pulmonary nodule in the left lower lobe abutting the major fissure (image 71 of series 512), unchanged compared to prior study 05/01/2016, favored to represent a subpleural lymph node. No other suspicious appearing pulmonary nodules or masses are noted in the visualized portions of the thorax. Partially calcified pleural plaque in the right hemithorax (image 31 of series 513) again noted. No calcified pleural plaques in the left hemithorax. Visualized portions of the upper abdomen demonstrate a nodular contour of the liver, suggestive of underlying cirrhosis. No aggressive appearing lytic or blastic lesions are noted in the visualized portions of the skeleton. Right upper extremity PICC with tip terminating in the distal superior vena cava. IMPRESSION: 1. Interval increased size of what is now a large pericardial effusion. No associated pericardial calcification. 2. Enlargement of a large layering right-sided pleural effusion. This is associated with extensive passive atelectasis in the right lower lobe. 3. Aortic atherosclerosis, with similar aneurysmal dilatation of the ascending thoracic aorta which measures up to 4.8 cm in diameter. 4. Morphologic changes in the liver indicative of cirrhosis, as above. 5. 4 mm subpleural nodule in the periphery of the left lower lobe is nonspecific, but statistically likely a subpleural lymph node. No follow-up needed if patient is low-risk. Non-contrast chest CT can be considered in 12 months if patient is high-risk. This recommendation follows the  consensus statement: Guidelines for Management of Incidental Pulmonary Nodules Detected on CT Images: From the Fleischner Society 2017; Radiology 2017; 284:228-243. 6. Additional incidental findings, as above. Electronically Signed: By: Trudie Reed M.D. On: 09/01/2016 14:38   Dg Chest Port 1 View  Result Date: 09/12/2016 CLINICAL DATA:  Right chest tube removal EXAM: PORTABLE CHEST 1 VIEW COMPARISON:  09/11/2016 FINDINGS: Right chest tube has been removed. There is small right pleural effusion with right basilar atelectasis or infiltrate. No pneumothorax. Right arm PICC line with tip in SVC right atrium junction. IMPRESSION: Small right pleural effusion with right basilar atelectasis or infiltrate. Stable right arm PICC line position. No pneumothorax. Electronically Signed   By: Natasha Mead M.D.   On: 09/12/2016 14:55   Dg Chest Port 1 View  Result Date: 09/11/2016 CLINICAL DATA:  Respiratory failure, shortness of breath, status post aortic valve replacement on October24, 2017. EXAM: PORTABLE CHEST 1 VIEW COMPARISON:  Portable chest x-rays of September 05, 2016 and September 10, 2016. FINDINGS: The left lung is well-expanded and clear. On the right no pneumothorax is evident. There is a small right pleural effusion. The interstitial markings remain prominent especially at the lung base. The right-sided chest tube tip projects over the inferior margin of the posterior aspect of the right sixth rib. The cardiac silhouette remains enlarged. The pulmonary vascularity is less engorged. There is calcification in the wall of the aortic arch. There degenerative changes of the shoulders and there is an old healed midshaft right clavicular fracture. The right-sided PICC line tip projects over the midportion of the SVC. IMPRESSION: Mild improvement in the pulmonary interstitium overall consistent with decreasing interstitial edema. Persistent small right pleural effusion. The right-sided chest tube is in stable  position. Electronically Signed   By: David  [place] M.D.   On: 09/11/2016 07:40   Dg Chest Port 1 View  Result Date: 09/10/2016 CLINICAL DATA:  Chest congestion.  Cough. EXAM: PORTABLE CHEST 1 VIEW COMPARISON:  Yesterday. FINDINGS: Stable enlarged cardiac silhouette and transcatheter aortic valve. The right PICC is unchanged. A right chest tube remains in place with  no pneumothorax. The pulmonary vasculature and interstitial markings remain prominent. Thoracic spine degenerative changes. IMPRESSION: Stable cardiomegaly and changes of congestive heart failure. Electronically Signed   By: Beckie Salts M.D.   On: 09/10/2016 08:14   Dg Chest Port 1 View  Result Date: 09/09/2016 CLINICAL DATA:  Status post TAVR and right chest tube placement for hemothorax. EXAM: PORTABLE CHEST 1 VIEW COMPARISON:  09/08/2016 FINDINGS: Right chest tube remains in place with no significant pleural fluid volume. No pneumothorax. There remains atelectasis in the right lower lung. PICC line positioning stable in the distal SVC. Stable cardiac enlargement and appearance of transcatheter aortic valve. Suspect mild residual interstitial edema which appears stable. IMPRESSION: No significant residual right pleural fluid volume with atelectasis remaining in the right lower lung. Stable mild interstitial edema. Electronically Signed   By: Irish Lack M.D.   On: 09/09/2016 09:13   Dg Chest Port 1 View  Result Date: 09/08/2016 CLINICAL DATA:  Shortness of breath.  CHF. EXAM: PORTABLE CHEST 1 VIEW COMPARISON:  09/07/2016. FINDINGS: Right PICC line and right chest tube in stable position. No pneumothorax. Right pleural effusion is improved slightly. Cardiomegaly with diffuse pulmonary interstitial prominence mild congestive heart failure cannot be excluded . IMPRESSION: 1. Right PICC line right chest tube in stable position. No pneumothorax. Right pleural effusion has improved slightly. 2. With bilateral from interstitial prominence.  Mild congestive heart failure cannot be excluded . Electronically Signed   By: Maisie Fus  Register   On: 09/08/2016 08:48   Dg Chest Port 1 View  Result Date: 09/07/2016 CLINICAL DATA:  Hemothorax with new right chest tube. EXAM: PORTABLE CHEST 1 VIEW COMPARISON:  Earlier today FINDINGS: Decreased but still extensive right hemothorax after chest tube placement. No visualized gas component. Right upper extremity PICC with tip at the SVC level. Unchanged cardiopericardial enlargement. Status post transcatheter aortic valve replacement. IMPRESSION: 1. Right chest tube placement with diminished hemothorax. 2. Elsewhere stable chest. Electronically Signed   By: Marnee Spring M.D.   On: 09/07/2016 09:05   Dg Chest Port 1 View  Result Date: 09/07/2016 CLINICAL DATA:  Vomiting EXAM: PORTABLE CHEST 1 VIEW COMPARISON:  Yesterday FINDINGS: Stable right upper extremity PICC. Swan-Ganz catheter and right jugular introducer have been removed. Right chest tube is been removed. A large right pleural effusion has accumulated. There is compressive atelectasis within much of the right lung. Vascular congestion. Left lung is otherwise clear. Cardiomegaly. Small right pneumothorax is suspected. IMPRESSION: Right chest tube removed. Large right pleural effusion has reaccumulated. Very small right apical pneumothorax has also developed. Electronically Signed   By: Jolaine Click M.D.   On: 09/07/2016 07:39   Dg Chest Port 1 View  Result Date: 09/06/2016 CLINICAL DATA:  Cardiac surgery EXAM: PORTABLE CHEST 1 VIEW COMPARISON:  Yesterday FINDINGS: Right upper extremity PICC, right chest tube, right jugular introducer, right Swan-Ganz catheter are stable. There is no pneumothorax. Small right pleural effusion is stable. Vascular congestion and mild edema are worse. Lower lung volumes. Cardiac silhouette remains markedly enlarged. IMPRESSION: Worsening vascular congestion and edema. No pneumothorax. Electronically Signed   By:  Jolaine Click M.D.   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 Postoperative image. EXAM: PORTABLE CHEST 1 VIEW COMPARISON:  CT chest 09/01/2016 and single view of the chest 08/26/2016. FINDINGS: Endotracheal tube is in place with tip in good position at the level of the clavicular heads.  Right IJ approach Swan-Ganz catheter tip is in the proximal right main pulmonary artery. Tip of right PICC projects in the lower superior vena cava. NG tube courses into the stomach. Right chest tube is identified. There is a small right pleural effusion and basilar atelectasis, improved since the prior chest film. The lungs are otherwise clear. The cardiopericardial silhouette is enlarged. No pneumothorax. IMPRESSION: Support tubes and lines projecting good position. Negative for pneumothorax. Decreased small right pleural effusion and basilar atelectasis. Enlarged cardiopericardial silhouette consistent with cardiomegaly and pericardial effusion as seen on CT scan. Electronically Signed   By: Drusilla Kanner M.D.   On: 09/05/2016 13:56   Dg Chest Port 1 View  Result Date: 08/26/2016 CLINICAL DATA:  65 year old male with a history of shortness of breath EXAM: PORTABLE CHEST 1 VIEW COMPARISON:  08/25/2016, CT chest 05/01/2016 FINDINGS: Compare to the prior plain film there is unchanged size of the cardiac silhouette. Calcifications of the aortic arch. Interval placement of right upper extremity PICC with the tip appearing to terminate at the superior cavoatrial junction. Increasing opacity at the right base with thickening of the minor fissure, obscuration the right hemidiaphragm and right heart border. Interlobular septal thickening. IMPRESSION: Evidence of CHF with increasing right-sided pleural effusion/atelectasis. Unchanged cardiac silhouette in this patient with a history of pericardial effusion imaged on prior CT 05/01/2016.  Interval placement of right upper extremity PICC. Aortic atherosclerosis. Signed, Yvone Neu. Loreta Ave, DO [facility] Electronically Signed   By: Gilmer Mor D.O.   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 Dilated cardiomyopathy. Acute kidney injury. EXAM: PORTABLE CHEST 1 VIEW COMPARISON:  None. FINDINGS: Marked cardiac enlargement noted. Diffuse interstitial infiltrates consistent with mild interstitial edema. Small right pleural effusion also seen with right basilar atelectasis. IMPRESSION: Mild congestive heart failure, with small right pleural effusion and right basilar atelectasis. Electronically Signed   By: Myles Rosenthal M.D.   On: 08/25/2016 15:14   Ct Angio Chest/abd/pel For Dissection W And/or W/wo  Result Date: 09/03/2016 CLINICAL DATA:  65 year old male with history of severe aortic stenosis. Preprocedural study prior to potential transcatheter aortic valve replacement. Recent history of pericardiocentesis. EXAM: CT ANGIOGRAPHY CHEST, ABDOMEN AND PELVIS TECHNIQUE: Multidetector CT imaging through the chest, abdomen and pelvis was performed using the standard protocol during bolus administration of intravenous contrast. Multiplanar reconstructed images and MIPs were obtained and reviewed to evaluate the vascular anatomy. CONTRAST:  80 mL of Isovue 370. COMPARISON:  Cardiac gated CTA 09/01/2016. FINDINGS: CTA CHEST FINDINGS Cardiovascular: Heart size is mildly enlarged. Small amount of residual pericardial fluid and/or thickening, decreased compared to the prior study. Pericardiocentesis catheter in place with tip terminating high in a superior pericardial recess adjacent to the aortic arch. No pericardial calcification. Severe thickening calcification of the aortic valve, which has an appearance suggestive of a bicuspid valve. There is aortic  atherosclerosis, as well as atherosclerosis of the great vessels of the mediastinum and the coronary arteries, including calcified atherosclerotic plaque in the left anterior descending and right coronary arteries. 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 Large filling defect within the left atrial appendage, concerning for potential left atrial appendage thrombus. Right upper extremity PICC with tip terminating at the superior cavoatrial junction. Mediastinum/Lymph Nodes: Several borderline enlarged and minimally enlarged mediastinal lymph nodes measuring up to 14 mm in short axis, slightly increased  compared to the prior study, likely reactive. Esophagus is unremarkable in appearance. No axillary lymphadenopathy. Lungs/Pleura: Large right pleural effusion again noted, lying dependently. This is associated with near complete passive atelectasis of the right lower lobe. Trace left pleural effusion with minimal passive subsegmental atelectasis in the dependent left lower lobe. No acute consolidative airspace disease. Previously noted tiny subpleural known nodule in the anterior aspect of the left lower lobe is no longer noted, presumably a reactive subpleural lymph node on the prior examination. Partially calcified small right-sided pleural plaque is unchanged (image 63 of series 5). No other calcified pleural plaques are identified. Tiny calcified granulomas are again noted in the periphery of the right lower lobe, now with an atelectatic lung. Musculoskeletal/Soft Tissues: There are no aggressive appearing lytic or blastic lesions noted in the visualized portions of the skeleton. CTA ABDOMEN AND PELVIS FINDINGS Hepatobiliary: Liver has a shrunken appearance and nodular contour, compatible with underlying cirrhosis. No discrete cystic or solid hepatic lesions. No intra or extrahepatic biliary ductal dilatation. Large rim calcified gallstone in the gallbladder measuring up to 3.8  cm in diameter. No current findings to suggest an acute cholecystitis at this time. Pancreas: No pancreatic mass or peripancreatic inflammatory changes. No pancreatic ductal dilatation. Pancreatic atrophy. No pancreatic or peripancreatic fluid. Spleen: Unremarkable. Adrenals/Urinary Tract: Bilateral kidneys and bilateral adrenal glands are unremarkable in appearance. No hydroureteronephrosis. In the dependent portion of the urinary bladder there is a 12 mm calculus (image 254 of series 5). Urinary bladder is otherwise unremarkable in appearance. Stomach/Bowel: Normal appearance of the stomach. No pathologic dilatation of small bowel or colon. Numerous colonic diverticulae are noted, without surrounding inflammatory changes to suggest an acute diverticulitis at this time. The appendix is not confidently identified and may be surgically absent. Regardless, there are no inflammatory changes noted adjacent to the cecum to suggest the presence of an acute appendicitis at this time. Vascular/Lymphatic: Aortic atherosclerosis with vascular findings and measurements pertinent to potential TAVR procedure, as detailed below. No aneurysm or dissection identified in the abdominal or pelvic vasculature. Celiac axis, superior mesenteric artery and inferior mesenteric artery are all widely patent without hemodynamically significant stenosis. Single renal arteries bilaterally are widely patent. Portal vein is mildly dilated measuring 16 mm in the porta hepatis. Reproductive: Prostate gland and seminal vesicles are unremarkable in appearance. Other: Small left inguinal hernia containing a short segment of small bowel. No significant volume of ascites. No pneumoperitoneum. Musculoskeletal: There are no aggressive appearing lytic or blastic lesions noted in the visualized portions of the skeleton. VASCULAR MEASUREMENTS PERTINENT TO TAVR: AORTA: Minimal Aortic Diameter -  18 x 14 mm Severity of Aortic Calcification -  moderate RIGHT  PELVIS: Right Common Iliac Artery - Minimal Diameter - 11.0 x 9.1 mm Tortuosity - moderate Calcification - mild to moderate Right External Iliac Artery - Minimal Diameter - 8.7 x 8.9 mm Tortuosity - moderate to severe Calcification - none Right Common Femoral Artery - Minimal Diameter - 9.2 x 6.8 mm Tortuosity - mild Calcification - mild LEFT PELVIS: Left Common Iliac Artery - Minimal Diameter - 9.5 x 10.0 mm Tortuosity - severe Calcification - none Left External Iliac Artery - Minimal Diameter - 8.1 x 7.9 mm Tortuosity - moderate Calcification - none Left Common Femoral Artery - Minimal Diameter - 8.5 x 6.3 mm Tortuosity - mild Calcification - mild Review of the MIP images confirms the above findings. IMPRESSION: 1. Vascular findings and measurements pertinent to potential TAVR procedure, as detailed above. This  patient appears to have suitable pelvic arterial access, however, this access is most optimal on the right side secondary to the lesser degree of tortuosity. Additionally, there is a small left inguinal hernia containing a short segment of small bowel which comes in close proximity to the left common femoral artery. 2. Severe thickening calcification of what appears to be a bicuspid aortic valve, compatible with the reported clinical history of severe aortic stenosis. 3. Interval pericardiocentesis with persistent indwelling pericardiocentesis catheter, with significant decreased size of what is now only a small amount of residual pericardial fluid and/or thickening. 4. Large filling defect in the tip of the left atrial appendage, concerning for potential left atrial appendage thrombus (although this could simply reflect pseudo thrombus secondary to contrast bolus timing). Correlation with TEE is recommended, if not already performed. 5. Aortic atherosclerosis, in addition to 2 vessel coronary artery disease. Please note that although the presence of coronary artery calcium documents the presence of  coronary artery disease, the severity of this disease and any potential stenosis cannot be assessed on this non-gated CT examination. Assessment for potential risk factor modification, dietary therapy or pharmacologic therapy may be warranted, if clinically indicated. 6. Persistent large right-sided pleural effusion with extensive passive atelectasis in the right lower lobe. Trace left pleural effusion with minimal dependent passive atelectasis in the left lower lobe. 7. Cholelithiasis without evidence of acute cholecystitis. 8. Colonic diverticulosis without evidence of acute diverticulitis at this time. 9. Morphologic changes in the liver compatible with underlying cirrhosis. 10. Additional incidental findings, as above. Electronically Signed   By: Trudie Reed M.D.   On: 09/03/2016 12:24    Disposition   Pt is being discharged home today in good condition.  Follow-up Plans & Appointments    Follow-up Information    Marca Ancona, MD Follow up on 09/29/2016.   Specialty:  Cardiology Why:  Heart Failure Clinic located on the 1st floor at [facility]. 2:40 Garage [id number]  Office will for time and date of INR check. Need to be check Tuesday Contact information: [address] [place] [address] [phone]          Discharge Instructions    AMB Referral to [facility] Care Management    Complete by:  As directed    Reason for consult:  Restart of services;  Post hospital follow up; Long LOS  S/P TVAR   Diagnoses of:  Heart Failure   Expected date of contact:  1-3 days (reserved for hospital discharges)   Restart services for post hospital follow up.  Please assign to community nurse for transition of care calls and assess for home visits. Questions please call:   Charlesetta Shanks, RN BSN CCM [facility]  [phone] business mobile phone Toll free office [phone]   Diet - low sodium heart healthy    Complete by:  As directed    Increase  activity slowly    Complete by:  As directed       Discharge Medications   Current Discharge Medication List    START taking these medications   Details  amiodarone (PACERONE) 200 MG tablet Take 1 tablet (200 mg total) by mouth 2 (two) times daily. Qty: 60 tablet, Refills: 6    atorvastatin (LIPITOR) 40 MG tablet Take 1 tablet (40 mg total) by mouth daily at 6 PM. Qty: 30 tablet, Refills: 6    spironolactone (ALDACTONE) 25 MG tablet Take 1 tablet (25 mg total) by mouth daily. Qty:  30 tablet, Refills: 6    warfarin (COUMADIN) 2 MG tablet Take 1 tablet (2 mg total) by mouth daily. Except 4mg  (2 tablets) Monday, Wednesday and Friday. Qty: 45 tablet, Refills: 2      CONTINUE these medications which have CHANGED   Details  furosemide (LASIX) 40 MG tablet Take 1 tablet (40 mg total) by mouth daily. Take 1/2 tablet in the AM and 1/2 tablet in the evening Qty: 30 tablet, Refills: 6   Associated Diagnoses: Calculus of gallbladder without cholecystitis without obstruction      CONTINUE these medications which have NOT CHANGED   Details  famotidine (PEPCID) 20 MG tablet Take 20 mg by mouth daily.      STOP taking these medications     apixaban (ELIQUIS) 5 MG TABS tablet      carvedilol (COREG) 6.25 MG tablet      ibuprofen (ADVIL,MOTRIN) 200 MG tablet      potassium chloride SA (K-DUR,KLOR-CON) 20 MEQ tablet           Outstanding Labs/Studies   BMET and INR  Duration of Discharge Encounter   Greater than 30 minutes including physician time.  Signed, Bhagat,Bhavinkumar PA-C 09/16/2016, 12:00 PM  Agree with above. See my rounding note from today for other details.   Bensimhon, Daniel,MD 10:34 PM

## 2016-09-16 NOTE — Progress Notes (Signed)
Pt anticipating discharge later today.  Educated pt on heart healthy diet, exercise guidelines and restrictions. Questions answered, handouts provided.  Pt declined cardiac rehab phase II referral to [facility] due to exercising at the [facility] with silver sneakers provided by [facility].  Pt asked about walker for home use.  Spoke to primary RN, Geraldine Contras for arranging home equipment. Alanson Aly, BSN

## 2016-09-16 NOTE — Progress Notes (Signed)
Patient ID: Derrian Poli, male   DOB: 22-Aug-1951, 65 y.o.   MRN: [id number]    SUBJECTIVE:   Admitted with cardiogenic shock with low EF and severe AS. PICC placed , initial co-ox on 10/14 47%.  He was started on milrinone.   Found to have large pericardial effusion. Underwent pericardiocentesis on 10/20 with 775cc out. Fluid transudative.   TEE 10/23 confirmed amorphous material in LA appendage, appears to be forming thrombus.  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   Post-TAVR echo 10/25 with EF up to 45%, moderate LVH, bioprosthetic AVR with mean gradient 20 (higher than expected), PASP 37, normal RV, no pericardial effusion.   On 10/26 developed nausea/vomiting and hypotension.  He was started on norepinephrine and IV fluid with low CVP. CXR showed large right pleural effusion accumulation. Chest tube replaced on right, 1300 cc bloody fluid out.  Heparin stopped.  1 unit PRBCs given. Also noted rise in creatinine with poor UOP.  Echo 10/26 with EF about 45%, small pericardial effusion with no tamponade. PCT up to 4.63, possible HCAP.  Suspect septic shock => resolved.   Chest tube removed 10/31.    Creatinine trending down. 2.4>1.7 >1.32> 1.08  SBP in 80s this am so carvedilol held. Choked on potassium. Feels fine now.  Denies SOB/Orthopnea. Co-ox 44-> 51%    RHC/LHC (08/29/16) Left Main  Mild ostial narrowing.  Left Anterior Descending  30% proximal LAD stenosis. 60% distal LAD stenosis. Moderate D1 without significant disease. Small to moderate D2 with long 70% ostial stenosis.  Left Circumflex  50% proximal LCx stenosis at OM1. Moderate OM1 with 30% ostial stenosis.  Right Coronary Artery  40% proximal and 40% mid RCA stenosis.   RA mean 19 RV 65/15 PA 69/34, mean 51 PCWP mean 43 (not sure we got an accurate PCWP).  AO 106/87 Oxygen saturations: PA 46% AO 92% Cardiac Output (Fick) 3.8  Cardiac Index (Fick) 1.9  Echo (10/14): EF  15%, severe AS, moderately decreased RV systolic function, PASP 65 mmHg, moderate to large pericardial effusion without tamponade.   Scheduled Meds: . sodium chloride   Intravenous Once  . sodium chloride   Intravenous Once  . amiodarone  200 mg Oral BID  . atorvastatin  40 mg Oral q1800  . carvedilol  3.125 mg Oral BID WC  . furosemide  40 mg Oral Daily  . mouth rinse  15 mL Mouth Rinse BID  . polyethylene glycol  17 g Oral Daily  . sodium chloride flush  10-40 mL Intracatheter Q12H  . sodium chloride flush  3 mL Intravenous Q12H  . spironolactone  12.5 mg Oral Daily  . Warfarin - Pharmacist Dosing Inpatient   Does not apply q1800   Continuous Infusions:   PRN Meds:.sodium chloride, Place/Maintain arterial line **AND** sodium chloride, acetaminophen, alum & mag hydroxide-simeth, docusate sodium, famotidine, lactulose, morphine injection, ondansetron (ZOFRAN) IV, oxyCODONE, oxyCODONE-acetaminophen, sodium chloride flush, sodium chloride flush, traMADol, traZODone, white petrolatum   Vitals:   09/15/16 1749 09/15/16 1840 09/15/16 2028 09/16/16 0511  BP: (!) 86/52 (!) 96/53 (!) 86/56 115/72  Pulse: 81 68 (!) 52 69  Resp:   18 18  Temp:   98.6 F (37 C) 98.2 F (36.8 C)  TempSrc:   Oral Oral  SpO2:   95% 98%  Weight:    83.6 kg (184 lb 4.9 oz)  Height:        Intake/Output Summary (Last 24 hours) at 09/16/16 1119  Last data filed at 09/16/16 0900  Gross per 24 hour  Intake              600 ml  Output             1300 ml  Net             -700 ml    LABS: Basic Metabolic Panel:  Recent Labs  16/10/96 0415 09/16/16 0500  NA 137 136  K 3.4* 3.5  CL 103 102  CO2 27 28  GLUCOSE 107* 94  BUN 25* 16  CREATININE 1.32* 1.08  CALCIUM 8.0* 7.9*  PHOS 2.4* 2.6   Liver Function Tests:  Recent Labs  09/15/16 0415 09/16/16 0500  ALBUMIN 1.9* 1.8*   No results for input(s): LIPASE, AMYLASE in the last 72 hours. CBC:  Recent Labs  09/15/16 0415 09/16/16 0500  WBC  13.6* 11.1*  NEUTROABS 11.0* 8.3*  HGB 8.6* 8.5*  HCT 26.9* 27.3*  MCV 83.5 85.8  PLT 276 305   Cardiac Enzymes: No results for input(s): CKTOTAL, CKMB, CKMBINDEX, TROPONINI in the last 72 hours. BNP: Invalid input(s): POCBNP D-Dimer: No results for input(s): DDIMER in the last 72 hours. Hemoglobin A1C: No results for input(s): HGBA1C in the last 72 hours. Fasting Lipid Panel: No results for input(s): CHOL, HDL, LDLCALC, TRIG, CHOLHDL, LDLDIRECT in the last 72 hours. Thyroid Function Tests: No results for input(s): TSH, T4TOTAL, T3FREE, THYROIDAB in the last 72 hours.  Invalid input(s): FREET3 Anemia Panel: No results for input(s): VITAMINB12, FOLATE, FERRITIN, TIBC, IRON, RETICCTPCT in the last 72 hours.  RADIOLOGY: Dg Chest 2 View  Result Date: 09/14/2016 CLINICAL DATA:  Pleural effusion.  Shortness of breath for 1 week. EXAM: CHEST  2 VIEW COMPARISON:  09/12/2016 FINDINGS: Right arm PICC line tip is at the cavoatrial junction. Moderate cardiac enlargement. Persistent right pleural effusion noted. There is a lucency within the effusion which is new from previous exam. Left lung is clear. IMPRESSION: 1. Persistent right pleural effusion which now contains gas. This may be related to recent removal of right-sided chest tube. Other considerations include development superimposed infection/ empyema. These results will be called to the ordering clinician or representative by the Radiologist Assistant, and communication documented in the PACS or zVision Dashboard. Electronically Signed   By: Signa Kell M.D.   On: 09/14/2016 11:27   US Renal  Result Date: 09/08/2016 CLINICAL DATA:  Acute renal failure EXAM: RENAL / URINARY TRACT ULTRASOUND COMPLETE COMPARISON:  CT abdomen pelvis 04/11/2016 FINDINGS: Right Kidney: Length: 10.8 cm. Negative for obstruction or mass. Limited evaluation the right kidney due to chest tube and bandages. Left Kidney: Length: 12.2 cm. Echogenicity within normal  limits. No mass or hydronephrosis visualized. Bladder: Appears normal for degree of bladder distention. IMPRESSION: Negative renal ultrasound. Electronically Signed   By: Marlan Palau M.D.   On: 09/08/2016 16:09   Ct Cardiac Morph/pulm Vein W/cm&w/o Ca Score  Addendum Date: 09/01/2016   ADDENDUM REPORT: 09/01/2016 17:18 CLINICAL DATA:  Aortic stenosis EXAM: Cardiac TAVR CT TECHNIQUE: The patient was scanned on a Philips 256 scanner. A 120 kV retrospective scan was triggered in the descending thoracic aorta at 111 HU's. Gantry rotation speed was 270 msecs and collimation was .9 mm. No beta blockade or nitro were given. The 3D data set was reconstructed in 5% intervals of the R-R cycle. Systolic and diastolic phases were analyzed on a dedicated work station using MPR, MIP and VRT modes. The patient received  80 cc of contrast. 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 Heavily calcified Aorta:  Aortic root dilated Sinotubular Junction:  32 mm Ascending Thoracic Aorta:  43 mm Aortic Arch:  30 mm Descending Thoracic Aorta:  26 mm Sinus of Valsalva Measurements: Non-coronary:  33 mm Right -coronary:  31 mm Left -coronary:  32 mm Coronary Artery Height above Annulus: Left Main:  19 mm above annulus Right Coronary:  10 mm above annulus Virtual Basal Annulus Measurements: Maximum/Minimum Diameter:  27.1 x 20.3 mm Perimeter:  78 mm Area:  442 mm2 Coronary Arteries:  Sufficient height above annulus for deployment Optimum Fluoroscopic Angle for Delivery:  LAO 29 degrees IMPRESSION: 1) functionally bicuspid AV heavily calcified with annulus suitable for 26 mm Sapien 3 valve 2) Moderate aortic root enlargement 4.3 cm 3) Large pericardial effusion patient to have pericardiocentesis post CT 4) Cannot r/o LAA thrombus suggest TEE correlation 5) Optimum angiographic angle for deployment LAO 29 degrees 6) Coronary heights sufficient for deployment Charlton Haws Electronically Signed   By:  Charlton Haws M.D.   On: 09/01/2016 17:18   Result Date: 09/01/2016 EXAM: OVER-READ INTERPRETATION  CT CHEST The following report is an over-read performed by radiologist Dr. [facility] on 09/01/2016. This over-read does not include interpretation of cardiac or coronary anatomy or pathology. The coronary calcium score/coronary CTA interpretation by the cardiologist is attached. COMPARISON:  Chest CT 05/01/2016. FINDINGS: Large pericardial effusion. No pericardial calcification. Aortic atherosclerosis with aneurysmal dilatation of the ascending thoracic aorta which measures up to 4.8 cm in diameter (unchanged). Large right-sided pleural effusion layering dependently. Extensive passive atelectasis in the right lower lobe. Tiny calcified granuloma in the periphery of the right lower lobe. 4 mm subpleural noncalcified pulmonary nodule in the left lower lobe abutting the major fissure (image 71 of series 512), unchanged compared to prior study 05/01/2016, favored to represent a subpleural lymph node. No other suspicious appearing pulmonary nodules or masses are noted in the visualized portions of the thorax. Partially calcified pleural plaque in the right hemithorax (image 31 of series 513) again noted. No calcified pleural plaques in the left hemithorax. Visualized portions of the upper abdomen demonstrate a nodular contour of the liver, suggestive of underlying cirrhosis. No aggressive appearing lytic or blastic lesions are noted in the visualized portions of the skeleton. Right upper extremity PICC with tip terminating in the distal superior vena cava. IMPRESSION: 1. Interval increased size of what is now a large pericardial effusion. No associated pericardial calcification. 2. Enlargement of a large layering right-sided pleural effusion. This is associated with extensive passive atelectasis in the right lower lobe. 3. Aortic atherosclerosis, with similar aneurysmal dilatation of the  ascending thoracic aorta which measures up to 4.8 cm in diameter. 4. Morphologic changes in the liver indicative of cirrhosis, as above. 5. 4 mm subpleural nodule in the periphery of the left lower lobe is nonspecific, but statistically likely a subpleural lymph node. No follow-up needed if patient is low-risk. Non-contrast chest CT can be considered in 12 months if patient is high-risk. This recommendation follows the consensus statement: Guidelines for Management of Incidental Pulmonary Nodules Detected on CT Images: From the Fleischner Society 2017; Radiology 2017; 284:228-243. 6. Additional incidental findings, as above. Electronically Signed: By: Trudie Reed M.D. On: 09/01/2016 14:38   Dg Chest Port 1 View  Result Date: 09/12/2016 CLINICAL DATA:  Right chest tube removal EXAM: PORTABLE CHEST 1 VIEW COMPARISON:  09/11/2016 FINDINGS: Right chest  tube has been removed. There is small right pleural effusion with right basilar atelectasis or infiltrate. No pneumothorax. Right arm PICC line with tip in SVC right atrium junction. IMPRESSION: Small right pleural effusion with right basilar atelectasis or infiltrate. Stable right arm PICC line position. No pneumothorax. Electronically Signed   By: Natasha Mead M.D.   On: 09/12/2016 14:55   Dg Chest Port 1 View  Result Date: 09/11/2016 CLINICAL DATA:  Respiratory failure, shortness of breath, status post aortic valve replacement on October24, 2017. EXAM: PORTABLE CHEST 1 VIEW COMPARISON:  Portable chest x-rays of September 05, 2016 and September 10, 2016. FINDINGS: The left lung is well-expanded and clear. On the right no pneumothorax is evident. There is a small right pleural effusion. The interstitial markings remain prominent especially at the lung base. The right-sided chest tube tip projects over the inferior margin of the posterior aspect of the right sixth rib. The cardiac silhouette remains enlarged. The pulmonary vascularity is less engorged. There is  calcification in the wall of the aortic arch. There degenerative changes of the shoulders and there is an old healed midshaft right clavicular fracture. The right-sided PICC line tip projects over the midportion of the SVC. IMPRESSION: Mild improvement in the pulmonary interstitium overall consistent with decreasing interstitial edema. Persistent small right pleural effusion. The right-sided chest tube is in stable position. Electronically Signed   By: David  [place] M.D.   On: 09/11/2016 07:40   Dg Chest Port 1 View  Result Date: 09/10/2016 CLINICAL DATA:  Chest congestion.  Cough. EXAM: PORTABLE CHEST 1 VIEW COMPARISON:  Yesterday. FINDINGS: Stable enlarged cardiac silhouette and transcatheter aortic valve. The right PICC is unchanged. A right chest tube remains in place with no pneumothorax. The pulmonary vasculature and interstitial markings remain prominent. Thoracic spine degenerative changes. IMPRESSION: Stable cardiomegaly and changes of congestive heart failure. Electronically Signed   By: Beckie Salts M.D.   On: 09/10/2016 08:14   Dg Chest Port 1 View  Result Date: 09/09/2016 CLINICAL DATA:  Status post TAVR and right chest tube placement for hemothorax. EXAM: PORTABLE CHEST 1 VIEW COMPARISON:  09/08/2016 FINDINGS: Right chest tube remains in place with no significant pleural fluid volume. No pneumothorax. There remains atelectasis in the right lower lung. PICC line positioning stable in the distal SVC. Stable cardiac enlargement and appearance of transcatheter aortic valve. Suspect mild residual interstitial edema which appears stable. IMPRESSION: No significant residual right pleural fluid volume with atelectasis remaining in the right lower lung. Stable mild interstitial edema. Electronically Signed   By: Irish Lack M.D.   On: 09/09/2016 09:13   Dg Chest Port 1 View  Result Date: 09/08/2016 CLINICAL DATA:  Shortness of breath.  CHF. EXAM: PORTABLE CHEST 1 VIEW COMPARISON:  09/07/2016.  FINDINGS: Right PICC line and right chest tube in stable position. No pneumothorax. Right pleural effusion is improved slightly. Cardiomegaly with diffuse pulmonary interstitial prominence mild congestive heart failure cannot be excluded . IMPRESSION: 1. Right PICC line right chest tube in stable position. No pneumothorax. Right pleural effusion has improved slightly. 2. With bilateral from interstitial prominence. Mild congestive heart failure cannot be excluded . Electronically Signed   By: Maisie Fus  Register   On: 09/08/2016 08:48   Dg Chest Port 1 View  Result Date: 09/07/2016 CLINICAL DATA:  Hemothorax with new right chest tube. EXAM: PORTABLE CHEST 1 VIEW COMPARISON:  Earlier today FINDINGS: Decreased but still extensive right hemothorax after chest tube placement. No visualized gas component. Right upper  extremity PICC with tip at the SVC level. Unchanged cardiopericardial enlargement. Status post transcatheter aortic valve replacement. IMPRESSION: 1. Right chest tube placement with diminished hemothorax. 2. Elsewhere stable chest. Electronically Signed   By: Marnee Spring M.D.   On: 09/07/2016 09:05   Dg Chest Port 1 View  Result Date: 09/07/2016 CLINICAL DATA:  Vomiting EXAM: PORTABLE CHEST 1 VIEW COMPARISON:  Yesterday FINDINGS: Stable right upper extremity PICC. Swan-Ganz catheter and right jugular introducer have been removed. Right chest tube is been removed. A large right pleural effusion has accumulated. There is compressive atelectasis within much of the right lung. Vascular congestion. Left lung is otherwise clear. Cardiomegaly. Small right pneumothorax is suspected. IMPRESSION: Right chest tube removed. Large right pleural effusion has reaccumulated. Very small right apical pneumothorax has also developed. Electronically Signed   By: Jolaine Click M.D.   On: 09/07/2016 07:39   Dg Chest Port 1 View  Result Date: 09/06/2016 CLINICAL DATA:  Cardiac surgery EXAM: PORTABLE CHEST 1 VIEW  COMPARISON:  Yesterday FINDINGS: Right upper extremity PICC, right chest tube, right jugular introducer, right Swan-Ganz catheter are stable. There is no pneumothorax. Small right pleural effusion is stable. Vascular congestion and mild edema are worse. Lower lung volumes. Cardiac silhouette remains markedly enlarged. IMPRESSION: Worsening vascular congestion and edema. No pneumothorax. Electronically Signed   By: Jolaine Click M.D.   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 Postoperative image. EXAM: PORTABLE CHEST 1 VIEW COMPARISON:  CT chest 09/01/2016 and single view of the chest 08/26/2016. FINDINGS: Endotracheal tube is in place with tip in good position at the level of the clavicular heads. Right IJ approach Swan-Ganz catheter tip is in the proximal right main pulmonary artery. Tip of right PICC projects in the lower superior vena cava. NG tube courses into the stomach. Right chest tube is identified. There is a small right pleural effusion and basilar atelectasis, improved since the prior chest film. The lungs are otherwise clear. The cardiopericardial silhouette is enlarged. No pneumothorax. IMPRESSION: Support tubes and lines projecting good position. Negative for pneumothorax. Decreased small right pleural effusion and basilar atelectasis. Enlarged cardiopericardial silhouette consistent with cardiomegaly and pericardial effusion as seen on CT scan. Electronically Signed   By: Drusilla Kanner M.D.   On: 09/05/2016 13:56   Dg Chest Port 1 View  Result Date: 08/26/2016 CLINICAL DATA:  65 year old male with a history of shortness of breath EXAM: PORTABLE CHEST 1 VIEW COMPARISON:  08/25/2016, CT chest 05/01/2016 FINDINGS: Compare to the prior plain film there is unchanged size of the cardiac silhouette. Calcifications of the aortic arch. Interval placement of right upper extremity PICC with the tip appearing to  terminate at the superior cavoatrial junction. Increasing opacity at the right base with thickening of the minor fissure, obscuration the right hemidiaphragm and right heart border. Interlobular septal thickening. IMPRESSION: Evidence of CHF with increasing right-sided pleural effusion/atelectasis. Unchanged cardiac silhouette in this patient with a history of pericardial effusion imaged on prior CT 05/01/2016. Interval placement of right upper extremity PICC. Aortic atherosclerosis. Signed, Yvone Neu. Loreta Ave, DO [facility] Electronically Signed   By: Gilmer Mor D.O.   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 Dilated cardiomyopathy. Acute kidney injury. EXAM: PORTABLE CHEST 1 VIEW COMPARISON:  None. FINDINGS: Marked  cardiac enlargement noted. Diffuse interstitial infiltrates consistent with mild interstitial edema. Small right pleural effusion also seen with right basilar atelectasis. IMPRESSION: Mild congestive heart failure, with small right pleural effusion and right basilar atelectasis. Electronically Signed   By: Myles Rosenthal M.D.   On: 08/25/2016 15:14   Ct Angio Chest/abd/pel For Dissection W And/or W/wo  Result Date: 09/03/2016 CLINICAL DATA:  65 year old male with history of severe aortic stenosis. Preprocedural study prior to potential transcatheter aortic valve replacement. Recent history of pericardiocentesis. EXAM: CT ANGIOGRAPHY CHEST, ABDOMEN AND PELVIS TECHNIQUE: Multidetector CT imaging through the chest, abdomen and pelvis was performed using the standard protocol during bolus administration of intravenous contrast. Multiplanar reconstructed images and MIPs were obtained and reviewed to evaluate the vascular anatomy. CONTRAST:  80 mL of Isovue 370. COMPARISON:  Cardiac gated CTA 09/01/2016. FINDINGS: CTA CHEST FINDINGS Cardiovascular:  Heart size is mildly enlarged. Small amount of residual pericardial fluid and/or thickening, decreased compared to the prior study. Pericardiocentesis catheter in place with tip terminating high in a superior pericardial recess adjacent to the aortic arch. No pericardial calcification. Severe thickening calcification of the aortic valve, which has an appearance suggestive of a bicuspid valve. There is aortic atherosclerosis, as well as atherosclerosis of the great vessels of the mediastinum and the coronary arteries, including calcified atherosclerotic plaque in the left anterior descending and right coronary arteries. 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 Large filling defect within the left atrial appendage, concerning for potential left atrial appendage thrombus. Right upper extremity PICC with tip terminating at the superior cavoatrial junction. Mediastinum/Lymph Nodes: Several borderline enlarged and minimally enlarged mediastinal lymph nodes measuring up to 14 mm in short axis, slightly increased compared to the prior study, likely reactive. Esophagus is unremarkable in appearance. No axillary lymphadenopathy. Lungs/Pleura: Large right pleural effusion again noted, lying dependently. This is associated with near complete passive atelectasis of the right lower lobe. Trace left pleural effusion with minimal passive subsegmental atelectasis in the dependent left lower lobe. No acute consolidative airspace disease. Previously noted tiny subpleural known nodule in the anterior aspect of the left lower lobe is no longer noted, presumably a reactive subpleural lymph node on the prior examination. Partially calcified small right-sided pleural plaque is unchanged (image 63 of series 5). No other calcified pleural plaques are identified. Tiny calcified granulomas are again noted in the periphery of the right lower lobe, now with an atelectatic lung. Musculoskeletal/Soft Tissues:  There are no aggressive appearing lytic or blastic lesions noted in the visualized portions of the skeleton. CTA ABDOMEN AND PELVIS FINDINGS Hepatobiliary: Liver has a shrunken appearance and nodular contour, compatible with underlying cirrhosis. No discrete cystic or solid hepatic lesions. No intra or extrahepatic biliary ductal dilatation. Large rim calcified gallstone in the gallbladder measuring up to 3.8 cm in diameter. No current findings to suggest an acute cholecystitis at this time. Pancreas: No pancreatic mass or peripancreatic inflammatory changes. No pancreatic ductal dilatation. Pancreatic atrophy. No pancreatic or peripancreatic fluid. Spleen: Unremarkable. Adrenals/Urinary Tract: Bilateral kidneys and bilateral adrenal glands are unremarkable in appearance. No hydroureteronephrosis. In the dependent portion of the urinary bladder there is a 12 mm calculus (image 254 of series 5). Urinary bladder is otherwise unremarkable in appearance. Stomach/Bowel: Normal appearance of the stomach. No pathologic dilatation of small bowel or colon. Numerous colonic diverticulae are noted, without surrounding inflammatory changes to suggest an acute diverticulitis at this time. The appendix is not confidently identified and may be surgically  absent. Regardless, there are no inflammatory changes noted adjacent to the cecum to suggest the presence of an acute appendicitis at this time. Vascular/Lymphatic: Aortic atherosclerosis with vascular findings and measurements pertinent to potential TAVR procedure, as detailed below. No aneurysm or dissection identified in the abdominal or pelvic vasculature. Celiac axis, superior mesenteric artery and inferior mesenteric artery are all widely patent without hemodynamically significant stenosis. Single renal arteries bilaterally are widely patent. Portal vein is mildly dilated measuring 16 mm in the porta hepatis. Reproductive: Prostate gland and seminal vesicles are unremarkable  in appearance. Other: Small left inguinal hernia containing a short segment of small bowel. No significant volume of ascites. No pneumoperitoneum. Musculoskeletal: There are no aggressive appearing lytic or blastic lesions noted in the visualized portions of the skeleton. VASCULAR MEASUREMENTS PERTINENT TO TAVR: AORTA: Minimal Aortic Diameter -  18 x 14 mm Severity of Aortic Calcification -  moderate RIGHT PELVIS: Right Common Iliac Artery - Minimal Diameter - 11.0 x 9.1 mm Tortuosity - moderate Calcification - mild to moderate Right External Iliac Artery - Minimal Diameter - 8.7 x 8.9 mm Tortuosity - moderate to severe Calcification - none Right Common Femoral Artery - Minimal Diameter - 9.2 x 6.8 mm Tortuosity - mild Calcification - mild LEFT PELVIS: Left Common Iliac Artery - Minimal Diameter - 9.5 x 10.0 mm Tortuosity - severe Calcification - none Left External Iliac Artery - Minimal Diameter - 8.1 x 7.9 mm Tortuosity - moderate Calcification - none Left Common Femoral Artery - Minimal Diameter - 8.5 x 6.3 mm Tortuosity - mild Calcification - mild Review of the MIP images confirms the above findings. IMPRESSION: 1. Vascular findings and measurements pertinent to potential TAVR procedure, as detailed above. This patient appears to have suitable pelvic arterial access, however, this access is most optimal on the right side secondary to the lesser degree of tortuosity. Additionally, there is a small left inguinal hernia containing a short segment of small bowel which comes in close proximity to the left common femoral artery. 2. Severe thickening calcification of what appears to be a bicuspid aortic valve, compatible with the reported clinical history of severe aortic stenosis. 3. Interval pericardiocentesis with persistent indwelling pericardiocentesis catheter, with significant decreased size of what is now only a small amount of residual pericardial fluid and/or thickening. 4. Large filling defect in the tip of  the left atrial appendage, concerning for potential left atrial appendage thrombus (although this could simply reflect pseudo thrombus secondary to contrast bolus timing). Correlation with TEE is recommended, if not already performed. 5. Aortic atherosclerosis, in addition to 2 vessel coronary artery disease. Please note that although the presence of coronary artery calcium documents the presence of coronary artery disease, the severity of this disease and any potential stenosis cannot be assessed on this non-gated CT examination. Assessment for potential risk factor modification, dietary therapy or pharmacologic therapy may be warranted, if clinically indicated. 6. Persistent large right-sided pleural effusion with extensive passive atelectasis in the right lower lobe. Trace left pleural effusion with minimal dependent passive atelectasis in the left lower lobe. 7. Cholelithiasis without evidence of acute cholecystitis. 8. Colonic diverticulosis without evidence of acute diverticulitis at this time. 9. Morphologic changes in the liver compatible with underlying cirrhosis. 10. Additional incidental findings, as above. Electronically Signed   By: Trudie Reed M.D.   On: 09/03/2016 12:24    PHYSICAL EXAM General: NAD. In the bed.  Neck: JVP 5-6 Lungs: Decreased RML RLL.  CV: Nondisplaced PMI.  Heart  irregular S1/S2, no S3/S4, 2/6 SEM RUSB. R and LLE trace edema.   Abdomen: Soft, NT, ND, no HSM. No bruits or masses. +BS  Neurologic: Alert and oriented x 3.  Psych: Normal affect. Extremities: No clubbing or cyanosis. RUE PICC R and LLE ted hose.   TELEMETRY: Reviewed, A fib 80s-90s  ASSESSMENT AND PLAN: 65 y/o ?with a h/o severe AS, LV dysfxn, HTN, noncompliance, thyroid nodules, pericardial effusion, cholelithiasis, and persistent AF on eliquis, who presentedto clinic on 08/23/16 with volume overload and was admitted for further work up of AS and CHF.  1. Acute on chronic systolic CHF: EF 25-42%  in setting of severe aortic stenosis.  May be due to severe aortic stenosis given lack of flow-limiting CAD.  This admission, he was initially hypotensive with SBP in 80s though BP has improved to 100s-110s range.  He was also markedly volume overloaded. PICC was placed, low output confirmed by co-ox 47%.  Milrinone 0.25 started then increased to 0.375, now weaned off.  Repeat echo post-TAVR with EF up to 45%.  Repeat limited echo 10/26 with small pericardial effusion, no tamponade.  Developed suspected septic shock most recently, off pressors.  Today CO-OX 44%. Repeat now. Asymptomatic - Lasix 40 mg daily.    - Renal function improving.  - Will hold carvedilol for now with low co-ox and low BP  2. Hypotension:  Pleural effusion drained, no pericardial tamponade, EF up to 45% and valve functioning ok (mildly elevated gradient).  Co-ox has been ok.  Suspect septic shock with rise in WBCs and high procalcitonin though no fever.  ?Right-sided PNA.  Blood cultures no growth so far.  Added empiric antibiotics with vancomycin/Zosyn => now off vanco, continue day 8 Zosyn .  Hydrocortisone added by CCM.  Now weaned off pressors/hydrocortisone.   3. Aortic stenosis: Severe AS. 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 McAlhany and [place]. - Bioprosthetic valve stable on post-op echo though mean gradient a bit high at 20 mmHg.  4. AKI on CKD stage III: Baseline creatinine 1.7-1.8.  - creatinine back own to 1.08 with resolution of septic shock.    5. Atrial fibrillation: Persistent, with RVR this admission.  He has been in atrial fibrillation at least since 6/17.  No prior DCCV. Not sure how long prior to 6/17 he was in atrial fibrillation.  - Continue amiodarone for rate control, now po.  Eventually would like to cardiovert. - He had developing thrombus on TEE => heparin held with bleeding into his right chest.   - With LAA thrombus despite Eliquis, will use Coumadin for home. Goal INR 2-3, 3.07 today - Will not be able to do  DCCV at this point with atrial thrombus => repeat TEE with possible DCCV in 1 month.  6. Pericardial effusion: Moderate to large, no tamponade.  s/p pericardiocentesis with Dr Clifton James 10/20. Pericardial drain removed 09/05/16.  Repeat echo 10/27 with small effusion, no tamponade.   7. Cirrhosis: Noted on prior abdominal CT, LFTs improved with diuresis.  8. CAD:  LHC with moderate coronary disease, nothing flow-limiting.  - off ASA with therapeutic INR.   9. Hyponatremia: resolved 10. Hemothorax: On right. Chest tube now out.  CXR reviewed by Dr Laneta Simmers. Plan to repeat CXR in a couple of weeks.  11. Anemia: Hemoglobin stable, 8.5. Fe studies looked ok.  Follow closely.    Can go home today with close f/u:  Meds on d/c  Lasix 40 daily Spiro 25 Amio 200 bid Warfarin Atorva 40  Arvilla MeresBensimhon, Daniel MD 09/16/2016 11:19 AM

## 2016-09-19 ENCOUNTER — Other Ambulatory Visit: Payer: Self-pay

## 2016-09-19 NOTE — Patient Outreach (Signed)
[facility] transition of care call: Admission date on 08/23/2016 --discharge date of 09/16/2016 to home. Placed initial call to patient. Explained reason for call.  Reviewed Transition of care program.   Patient reports that he is "wonderful" . States that he has not heard from a nurse yet.  I reviewed discharge instruction and do not see any referral for home health nurse.  Patient reports that he still has sutures in his chest from where the " drain was". ( I am assuming he means his chest tube).  Reports that he has a follow up appointment with Dr. Shirlee Latch on 09/29/2016.    Patient reports that he continues to weigh daily and today's weight of 184.   Reports no shortness of breath, no chest pain and no swelling.  Reviewed discharge medications and patient is taking medications as prescribed.  Reviewed patient instructions for INR and BMET.  Patient did not know anything about these labs that were due today.    Placed call to Charlesetta Shanks [facility] hospital liaison) to reviewed discharge orders from inpatient side.  No referral to home health confirmed. No scheduled labs confirmed.  Placed call back to patient to inquire if patient wanted to go to coumadin clinic ( on [address]) or see if he could be seen at primary MD office for hospital follow up.  Patient requested to go to primary MD office. Placed call to Andria Rhein at [facility] and reviewed case.  She was able to make a follow up appointment for 09/20/2016 and will update MD.  Placed call back to patient to inform of his 2pm appointment with primary MD on 09/20/2016 to discuss need for home health nurse, assess chest tube site and sutures and reviewed requested labs on discharge instructions. Patient agreed to office visit.  Medications Reviewed Today    Reviewed by Rockne Menghini, RN (Registered Nurse) on 09/19/16 at 1508  Med List Status: <None>  Medication Order Taking? Sig Documenting Provider Last Dose Status Informant  amiodarone  (PACERONE) 200 MG tablet [id number] Yes Take 1 tablet (200 mg total) by mouth 2 (two) times daily. Ellsworth Lennox, PA Taking Active   atorvastatin (LIPITOR) 40 MG tablet [id number] Yes Take 1 tablet (40 mg total) by mouth daily at 6 PM. Ellsworth Lennox, PA Taking Active   famotidine (PEPCID) 20 MG tablet [id number] Yes Take 20 mg by mouth daily. Historical Provider, MD Taking Active Self  furosemide (LASIX) 40 MG tablet [id number] Yes Take 1 tablet (40 mg total) by mouth daily.  Patient taking differently:  Take 20 mg by mouth 2 (two) times daily.    Ellsworth Lennox, PA Taking Active   ondansetron [facility]) 4 MG tablet [id number] Yes Take 1 tablet (4 mg total) by mouth every 8 (eight) hours as needed for nausea or vomiting. Ellsworth Lennox, PA Taking Active   spironolactone (ALDACTONE) 25 MG tablet [id number] Yes Take 1 tablet (25 mg total) by mouth daily. Ellsworth Lennox, PA Taking Active   warfarin (COUMADIN) 2 MG tablet [id number] Yes Take 1 tablet (2 mg total) by mouth daily. Except 4mg  (2 tablets) Monday, Wednesday and Friday. Hydrologist, PA Taking Active           [facility] CM Care Plan Problem One   Flowsheet Row Most Recent Value  Care Plan Problem One  Recent admission for shortness of breath and fluid overload.  Role Documenting the Problem One  Care Management Coordinator  Care Plan for Problem One  Active  THN Long Term Goal (31-90 days)  Patient will report no readmissions for the next 31 days.  THN Long Term Goal Start Date  09/19/16  Interventions for Problem One Long Term Goal  Reviewed discharge medications and need for labs. Reviewed pending appointments at the heart failure clinic. Placed  call to hospital liasion to confirm no home health orders. Placed call to MD office and spoke with Andria RheinKim Kirkland and got a follow up appointment for patient tomorrow for labs.   THN CM Short Term Goal #1 (0-30 days)  Patient will report attending MD appointment on 09/20/2016.  THN  CM Short Term Goal #1 Start Date  09/19/16  Interventions for Short Term Goal #1  Reviewed office visit time with patient.  THN CM Short Term Goal #2 (0-30 days)  Patient will report weighing daily for the next 30 days.  THN CM Short Term Goal #2 Start Date  09/19/16  Interventions for Short Term Goal #2  Reviewed importance of daily weights and heart failure zones.      PLAN: I will see patient for home visit on 09/25/2016 for assessment. Patient will get weekly outreach attempts via phone. Will send this update to MD and barrier letter.  Rowe PavyAmanda Cook, RN, BSN, CEN [facility] Care Coordinator [phone]

## 2016-09-20 DIAGNOSIS — I509 Heart failure, unspecified: Secondary | ICD-10-CM | POA: Diagnosis not present

## 2016-09-20 DIAGNOSIS — Z79899 Other long term (current) drug therapy: Secondary | ICD-10-CM | POA: Diagnosis not present

## 2016-09-20 DIAGNOSIS — I350 Nonrheumatic aortic (valve) stenosis: Secondary | ICD-10-CM | POA: Diagnosis not present

## 2016-09-20 DIAGNOSIS — N183 Chronic kidney disease, stage 3 (moderate): Secondary | ICD-10-CM | POA: Diagnosis not present

## 2016-09-20 DIAGNOSIS — E871 Hypo-osmolality and hyponatremia: Secondary | ICD-10-CM | POA: Diagnosis not present

## 2016-09-20 DIAGNOSIS — D649 Anemia, unspecified: Secondary | ICD-10-CM | POA: Diagnosis not present

## 2016-09-20 DIAGNOSIS — Z953 Presence of xenogenic heart valve: Secondary | ICD-10-CM | POA: Diagnosis not present

## 2016-09-20 DIAGNOSIS — I4891 Unspecified atrial fibrillation: Secondary | ICD-10-CM | POA: Diagnosis not present

## 2016-09-20 DIAGNOSIS — Z6828 Body mass index (BMI) 28.0-28.9, adult: Secondary | ICD-10-CM | POA: Diagnosis not present

## 2016-09-25 ENCOUNTER — Other Ambulatory Visit: Payer: Self-pay

## 2016-09-25 NOTE — Patient Outreach (Signed)
[facility]) Care Management   09/25/2016  Lucas Munoz Nov 30, 1950 [id number]  Lucas Munoz is an 65 y.o. male 12:30pm  Arrived for scheduled home visit.  Subjective: Patient reports that he is doing very well since surgery. States no difficulty with breathing and reports no weight gain.  States that his heart surgery has made a huge difference in his life.   Objective:  Awake and alert. Ambulating without difficulty.   Talking in complete sentences.  Vitals:   09/25/16 1231  BP: 114/72  Pulse: 98  Resp: 16  SpO2: 98%  Weight: 177 lb (80.3 kg)  Height: 1.727 m (5' 8" )   Review of Systems  Constitutional: Positive for weight loss.  HENT: Negative.   Eyes: Negative.   Respiratory: Negative for shortness of breath.   Cardiovascular: Negative for chest pain, palpitations and leg swelling.  Gastrointestinal: Negative.   Genitourinary: Positive for frequency.       Due to lasix  Musculoskeletal: Negative.   Skin:       Reports sutures still in the right chest.   Neurological: Negative.   Endo/Heme/Allergies: Bruises/bleeds easily.  Psychiatric/Behavioral: Negative.     Physical Exam  Constitutional: He is oriented to person, place, and time. He appears well-developed and well-nourished.  Cardiovascular: Intact distal pulses.   Irregular heart rate.  Respiratory: Effort normal and breath sounds normal.  GI: Soft. Bowel sounds are normal.  Musculoskeletal: He exhibits edema.  1 plus edema to both legs  Neurological: He is alert and oriented to person, place, and time. He has normal reflexes.  Skin: Skin is warm and dry.  Sutures x 2 intact to the right chest. No redness or signs of infection.  Scabbed area noted to the left forearm.  Healing bruised area to the right lower chest.   Psychiatric: He has a normal mood and affect. His behavior is normal. Judgment and thought content normal.    Encounter Medications:   Outpatient Encounter Prescriptions as of  09/25/2016  Medication Sig  . amiodarone (PACERONE) 200 MG tablet Take 1 tablet (200 mg total) by mouth 2 (two) times daily.  Marland Kitchen atorvastatin (LIPITOR) 40 MG tablet Take 1 tablet (40 mg total) by mouth daily at 6 PM.  . famotidine (PEPCID) 20 MG tablet Take 20 mg by mouth daily.  . furosemide (LASIX) 40 MG tablet Take 1 tablet (40 mg total) by mouth daily. (Patient taking differently: Take 20 mg by mouth 2 (two) times daily. )  . ondansetron (ZOFRAN) 4 MG tablet Take 1 tablet (4 mg total) by mouth every 8 (eight) hours as needed for nausea or vomiting.  Marland Kitchen spironolactone (ALDACTONE) 25 MG tablet Take 1 tablet (25 mg total) by mouth daily.  Marland Kitchen warfarin (COUMADIN) 2 MG tablet Take 1 tablet (2 mg total) by mouth daily. Except 7m (2 tablets) Monday, Wednesday and Friday.   No facility-administered encounter medications on file as of 09/25/2016.     Functional Status:   In your present state of health, do you have any difficulty performing the following activities: 09/25/2016 08/24/2016  Hearing? N -  Vision? Y -  Difficulty concentrating or making decisions? N -  Walking or climbing stairs? N -  Dressing or bathing? N -  Doing errands, shopping? N N  Preparing Food and eating ? N -  Using the Toilet? N -  In the past six months, have you accidently leaked urine? N -  Do you have problems with loss of bowel control? N -  Managing your Medications? N -  Managing your Finances? N -  Housekeeping or managing your Housekeeping? N -  Some recent data might be hidden    Fall/Depression Screening:    PHQ 2/9 Scores 09/25/2016 03/27/2016 03/17/2016  PHQ - 2 Score 0 2 2  PHQ- 9 Score - 3 3   Fall Risk  09/25/2016 03/27/2016 03/23/2016 03/17/2016  Falls in the past year? No No No No   Assessment:   (1) reviewed [facility] transition of care program. Provided patient at [facility] welcome packet with magnet included. Provided my card and phone number as well. Reviewed [facility] consent and patient declines any changes  from original consent on 03/27/2016. (2) Reports CHF under good control at this time. Follows low salt diet. Is knowledgeable about heart failure zones and when to call MD.  (3) Sutures x 2 to the right chest.  (4) reports loss of strength to legs.  (5) has planned follow up with MD's.   Plan:  (1) Will outreach patient weekly and sooner as needed per patient calls. (2) reviewe CHF zones and when to call MD. Encouraged patient to continue to report weight gains to MD.  (3) Patient reviewed the suture site with PCP this past week and has a follow up planned for 09/29/2016. (4) Encouraged patient to exercise when cleared by MD.  [facility] (5) Pending follow up with cardiology on 09/29/2016.  Pending PCP follow up on 10/02/2016 for additional labs.   Goal setting and care planning during home visit. Primary goal is to avoid a readmission. Next telephone outreach planned on 10/02/2016 at 1pm. Reviewed with patient. This note sent to primary MD.   [facility] CM Care Plan Problem One   Flowsheet Row Most Recent Value  Care Plan Problem One  Recent admission for shortness of breath and fluid overload.  Role Documenting the Problem One  Care Management [place] for Problem One  Active  THN Long Term Goal (31-90 days)  Patient will report no readmissions for the next 31 days.  THN Long Term Goal Start Date  09/19/16  Interventions for Problem One Long Term Goal  Home visit completed. Reviewed importance of follow up with cardiology.   THN CM Short Term Goal #1 (0-30 days)  Patient will report attending MD appointment on 09/20/2016.  THN CM Short Term Goal #1 Start Date  09/19/16  [facility] CM Short Term Goal #1 Met Date  09/25/16  Interventions for Short Term Goal #1  Reviewed office visit time with patient.  THN CM Short Term Goal #2 (0-30 days)  Patient will report weighing daily for the next 30 days.  THN CM Short Term Goal #2 Start Date  09/19/16  Interventions  for Short Term Goal #2  Again reviewed heart failure zones and when to call MD. Reviewed low salt diet.     NOTE:  First home visit on 03/27/2016.  Patient very sick with difficulty ambulating due to swelling and shortness of breath. On today's home visit patient much improved and able to ambulate without difficulty. Also no problems with shortness of breath.  Improved quality of life noted.   Tomasa Rand, RN, BSN, CEN [place] [employer] [phone]

## 2016-09-28 ENCOUNTER — Telehealth: Payer: Self-pay | Admitting: Cardiovascular Disease

## 2016-09-28 ENCOUNTER — Telehealth: Payer: Self-pay | Admitting: Pharmacist Clinician (PhC)/ Clinical Pharmacy Specialist

## 2016-09-28 NOTE — Telephone Encounter (Signed)
Per charting with [facility], patient had requested to get INR check in [place].  However, appt was made for Coumadin clinic at [facility] office.  He did not show for that appt, but did get checked at his PCP (Dr. Junious Dresser [facility]).    Spoke with Tammy at [facility], she believed the patient was going to follow up with our practice.    Called patient, he states he was not going to drive to [facility] and that Dr. Orvan Falconer was monitoring.  Explained to him that Dr. Orvan Falconer was not aware of this, although they would be happy to follow.  Stressed to him that because of LAA thrombus, it is very important to monitor weekly INRs until patient stable on dose.  Patient agreed to call [facility] to schedule for Monday or Tuesday of next week.  Called Tammy again, and she is aware that patient needs to call for appt, she will follow up.

## 2016-09-28 NOTE — Telephone Encounter (Signed)
Called patient and explained to him that he would need to contact his PCP to get a referral for an endocrinologist.  Patient understood.

## 2016-09-29 ENCOUNTER — Encounter (HOSPITAL_COMMUNITY): Payer: Self-pay

## 2016-09-29 ENCOUNTER — Ambulatory Visit (HOSPITAL_COMMUNITY)
Admit: 2016-09-29 | Discharge: 2016-09-29 | Disposition: A | Discharge status: Home / Self Care | Payer: Commercial Managed Care - HMO | Source: Ambulatory Visit | Attending: Cardiology | Admitting: Cardiology

## 2016-09-29 ENCOUNTER — Ambulatory Visit (HOSPITAL_COMMUNITY)
Admission: RE | Admit: 2016-09-29 | Discharge: 2016-09-29 | Disposition: A | Discharge status: Home / Self Care | Payer: Commercial Managed Care - HMO | Source: Ambulatory Visit | Attending: Cardiology | Admitting: Cardiology

## 2016-09-29 VITALS — BP 138/78 | HR 72 | Wt 173.8 lb

## 2016-09-29 DIAGNOSIS — Z8249 Family history of ischemic heart disease and other diseases of the circulatory system: Secondary | ICD-10-CM | POA: Diagnosis not present

## 2016-09-29 DIAGNOSIS — I2510 Atherosclerotic heart disease of native coronary artery without angina pectoris: Secondary | ICD-10-CM | POA: Insufficient documentation

## 2016-09-29 DIAGNOSIS — Z953 Presence of xenogenic heart valve: Secondary | ICD-10-CM | POA: Insufficient documentation

## 2016-09-29 DIAGNOSIS — Z87891 Personal history of nicotine dependence: Secondary | ICD-10-CM | POA: Insufficient documentation

## 2016-09-29 DIAGNOSIS — Z79899 Other long term (current) drug therapy: Secondary | ICD-10-CM | POA: Diagnosis not present

## 2016-09-29 DIAGNOSIS — I4819 Other persistent atrial fibrillation: Secondary | ICD-10-CM

## 2016-09-29 DIAGNOSIS — I481 Persistent atrial fibrillation: Secondary | ICD-10-CM | POA: Diagnosis not present

## 2016-09-29 DIAGNOSIS — I480 Paroxysmal atrial fibrillation: Secondary | ICD-10-CM | POA: Diagnosis not present

## 2016-09-29 DIAGNOSIS — Z952 Presence of prosthetic heart valve: Secondary | ICD-10-CM

## 2016-09-29 DIAGNOSIS — K7460 Unspecified cirrhosis of liver: Secondary | ICD-10-CM | POA: Diagnosis not present

## 2016-09-29 DIAGNOSIS — I110 Hypertensive heart disease with heart failure: Secondary | ICD-10-CM | POA: Diagnosis not present

## 2016-09-29 DIAGNOSIS — J90 Pleural effusion, not elsewhere classified: Secondary | ICD-10-CM | POA: Insufficient documentation

## 2016-09-29 DIAGNOSIS — Z7901 Long term (current) use of anticoagulants: Secondary | ICD-10-CM | POA: Diagnosis not present

## 2016-09-29 DIAGNOSIS — I5022 Chronic systolic (congestive) heart failure: Secondary | ICD-10-CM | POA: Diagnosis not present

## 2016-09-29 DIAGNOSIS — I517 Cardiomegaly: Secondary | ICD-10-CM | POA: Diagnosis not present

## 2016-09-29 LAB — CBC
HCT: 36.3 % — ABNORMAL LOW (ref 39.0–52.0)
HEMOGLOBIN: 11 g/dL — AB (ref 13.0–17.0)
MCH: 26.6 pg (ref 26.0–34.0)
MCHC: 30.3 g/dL (ref 30.0–36.0)
MCV: 87.9 fL (ref 78.0–100.0)
PLATELETS: 308 10*3/uL (ref 150–400)
RBC: 4.13 MIL/uL — AB (ref 4.22–5.81)
RDW: 21.5 % — ABNORMAL HIGH (ref 11.5–15.5)
WBC: 6.1 10*3/uL (ref 4.0–10.5)

## 2016-09-29 LAB — BRAIN NATRIURETIC PEPTIDE: B NATRIURETIC PEPTIDE 5: 1270.9 pg/mL — AB (ref 0.0–100.0)

## 2016-09-29 LAB — COMPREHENSIVE METABOLIC PANEL
ALK PHOS: 321 U/L — AB (ref 38–126)
ALT: 21 U/L (ref 17–63)
ANION GAP: 7 (ref 5–15)
AST: 29 U/L (ref 15–41)
Albumin: 2.7 g/dL — ABNORMAL LOW (ref 3.5–5.0)
BILIRUBIN TOTAL: 3.2 mg/dL — AB (ref 0.3–1.2)
BUN: 12 mg/dL (ref 6–20)
CALCIUM: 8.9 mg/dL (ref 8.9–10.3)
CO2: 26 mmol/L (ref 22–32)
Chloride: 104 mmol/L (ref 101–111)
Creatinine, Ser: 1.03 mg/dL (ref 0.61–1.24)
GFR calc non Af Amer: >60 mL/min (ref >60)
Glucose, Bld: 89 mg/dL (ref 65–99)
POTASSIUM: 4.2 mmol/L (ref 3.5–5.1)
SODIUM: 137 mmol/L (ref 135–145)
TOTAL PROTEIN: 7.3 g/dL (ref 6.5–8.1)

## 2016-09-29 LAB — TSH: TSH: 0.804 u[IU]/mL (ref 0.350–4.500)

## 2016-09-29 MED ORDER — CARVEDILOL 3.125 MG PO TABS
3.1250 mg | ORAL_TABLET | Freq: Two times a day (BID) | ORAL | 3 refills | Status: DC
Start: 2016-09-29 — End: 2016-12-29

## 2016-09-29 MED ORDER — AMIODARONE HCL 200 MG PO TABS: 200.0000 mg | ORAL_TABLET | Freq: Every day | ORAL | 6 refills | Status: DC

## 2016-09-29 NOTE — Patient Instructions (Signed)
Decrease Amiodarone to 200 mg daily  Start Carvedilol 3.125 mg Twice daily   Labs today  Chest X-Ray today  You have been referred to Cardiac Rehab at [facility], they will call you to schedule  Your physician recommends that you schedule a follow-up appointment in: 1 month

## 2016-10-01 NOTE — Progress Notes (Signed)
PCP: Dr. Junious DresserStephen [name] HF Cardiology: Dr. Shirlee LatchMcLean  65 yo with history of chronic systolic CHF, paroxysmal atrial fibrillation, CAD, and severe AS s/p TAVR presents for cardiology followup.  Patient was hospitalized in 10/17 with cardiogenic shock.  Echo showed EF 15% with severe AS and moderately decreased RV systolic function.  He was in persistent atrial fibrillation, TEE showed an LA appendage thrombus.  He was started on milrinone and diuresed. Moderate to large pericardial effusion was found and he had a pericardiocentesis. He had TAVR on 09/05/16.   Repeat echo on 09/07/16 showed EF improved to 50-55%.  Post-op from TAVR, he developed HCAP with septic shock.  He was treated with antibiotics and had right chest tube for pleural effusion.    He is now at home.  He is doing quite well. He is actually in NSR today.  No chest pain, no lightheadedness, no significant exertional dyspnea.  He wants to start cardiac rehab.  PCP following INR.   ECG: NSR, PACs, T wave inversions laterally.   Labs (11/17): K 3.5, creatinine 1.08, hgb 8.5  PMH: 1. HTN 2. Cholelithiasis 3. Cirrhosis: No ETOH per his report since 1990.  4. Aortic stenosis: Severe.  S/p TAVR 10/17.  5. Atrial fibrillation: Paroxysmal. H/o LA appendage thrombus.  6. CAD: Moderate.  - LHC (10/17): 60% distal LAD, long 70% ostial D2, 50% proximal LCx, 40% RCA.  7. Pericardial effusion: S/p pericardiocentesis 10/17. 8. Pleural effusion on right: s/p chest tube 10/17.  9. Chronic systolic CHF: This appears to have been primarily valvular, related to severe AS.  - Echo (08/26/16): EF 15%, severe AS, moderately decreased RV systolic function, moderate to large pericardial effusion.  - Echo (09/07/16): EF 50-55%, apical hypokinesis, bioprosthetic aortic valve with mean gradient 22 mmHg and no perivalvular AI.   Social History   Social History  . Marital status: Divorced    Spouse name: N/A  . Number of children: N/A  . Years of  education: N/A   Occupational History  . Not on file.   Social History Main Topics  . Smoking status: Former Smoker    Packs/day: 1.00    Years: 30.00    Types: Cigarettes    Quit date: 04/05/2000  . Smokeless tobacco: Never Used  . Alcohol use No     Comment: 08/24/2016 "stopped drinking in 1990"  . Drug use: No  . Sexual activity: Not Currently   Other Topics Concern  . Not on file   Social History Narrative  . No narrative on file   Family History  Problem Relation Age of Onset  . Cancer Mother   . Heart disease Father   . Alcohol abuse Brother   . Cancer Brother   . Cancer Maternal Grandmother   . Alcohol abuse Maternal Grandfather   . Alcohol abuse Paternal Grandfather    ROS: All systems reviewed and negative except as per HPI.   Current Outpatient Prescriptions  Medication Sig Dispense Refill  . amiodarone (PACERONE) 200 MG tablet Take 1 tablet (200 mg total) by mouth daily. 60 tablet 6  . atorvastatin (LIPITOR) 40 MG tablet Take 1 tablet (40 mg total) by mouth daily at 6 PM. 30 tablet 6  . famotidine (PEPCID) 20 MG tablet Take 20 mg by mouth daily.    . furosemide (LASIX) 40 MG tablet Take 1 tablet (40 mg total) by mouth daily. 30 tablet 6  . spironolactone (ALDACTONE) 25 MG tablet Take 1 tablet (25 mg total) by mouth  daily. 30 tablet 6  . warfarin (COUMADIN) 2 MG tablet Take 1 tablet (2 mg total) by mouth daily. Except 4mg  (2 tablets) Monday, Wednesday and Friday. 45 tablet 2  . carvedilol (COREG) 3.125 MG tablet Take 1 tablet (3.125 mg total) by mouth 2 (two) times daily. 60 tablet 3  . ondansetron (ZOFRAN) 4 MG tablet Take 1 tablet (4 mg total) by mouth every 8 (eight) hours as needed for nausea or vomiting. (Patient not taking: Reported on 09/29/2016) 10 tablet 0   No current facility-administered medications for this encounter.    BP 138/78   Pulse 72   Wt 173 lb 12 oz (78.8 kg)   SpO2 100%   BMI 26.42 kg/m  General: NAD Neck: No JVD, no thyromegaly  or thyroid nodule.  Lungs: Decreased breath sounds right base.  CV: Nondisplaced PMI.  Heart regular S1/S2, no S3/S4, 2/6 early SEM RUSB.  1+ ankle edema.  No carotid bruit.  Normal pedal pulses.  Abdomen: Soft, nontender, no hepatosplenomegaly, no distention.  Skin: Intact without lesions or rashes.  Neurologic: Alert and oriented x 3.  Psych: Normal affect. Extremities: No clubbing or cyanosis.  HEENT: Normal.   Assessment/Plan: 1. Aortic stenosis: S/p TAVR.  Echo on 09/07/16 showed well-seated bioprosthetic aortic valve with no significant perivalvular regurgitation. Mean gradient was mildly elevated and will have to be followed.   - Referral [facility] cardiac re[facility] at [facility].  2. Chronic systolic CHF: EF 37% on 08/26/16 with moderate RV dysfunction.  I suspect that this was primarily valvular due to severe AS as EF improved to 50-55% range after TAVR.  On exam he is not volume overloaded, NYHA class II symptoms. He seems significantly improved.  - Continue Lasix 40 mg daily.  - Add Coreg 3.125 mg bid.  - Continue spironolactone.  3. Atrial fibrillation: Had been persistent, but he is in NSR today.  Of note, he had an LA appendage thrombus noted by TEE.  - Continue amiodarone, can decrease to 200 mg daily.  CMET/TSH today.  Will need regular eye exam.  - Continue warfarin, followed by PCP.  Check CBC.  4. Pleural effusion: Still with decreased breath sounds right base.  I will arrange for CXR.  5. CAD: Moderate CAD without interventional target on 10/17 cath.  - On warfarin so not on ASA with stable CAD.  - Continue atorvastatin.  Lipids/LFTs in 2 months.   Marca Ancona 10/01/2016

## 2016-10-02 ENCOUNTER — Other Ambulatory Visit: Payer: Self-pay

## 2016-10-02 NOTE — Patient Outreach (Signed)
Transition of care call: Placed call to patient who reports that he is doing well. States he feels good. Reports that he saw cardiology last week and had a good visit. Reports is heart is in a normal rhythm. States that he sutures were removed. States MD will refer him to cardiac rehab at [facility]. Patient reports that he is going to primary MD office tomorrow for INR check/.   Reports no new problems or concerns today.   PLAN: Will follow up with patient next week as planned.  Lucas Pavy, RN, BSN, CEN [facility] [employer] [phone]

## 2016-10-03 ENCOUNTER — Telehealth (HOSPITAL_COMMUNITY): Payer: Self-pay | Admitting: *Deleted

## 2016-10-03 DIAGNOSIS — Z7901 Long term (current) use of anticoagulants: Secondary | ICD-10-CM | POA: Diagnosis not present

## 2016-10-03 MED ORDER — WARFARIN SODIUM 2 MG PO TABS: 2.0000 mg | ORAL_TABLET | Freq: Every day | ORAL | 2 refills | Status: DC

## 2016-10-03 NOTE — Telephone Encounter (Signed)
Patient called in to let us know that his PCP Dr.[name] in [place] will be managing his coumadin. Patients INR was checked today at is was 4.9. Patient instructed to hold coumadin today and then take 2mg  daily. Patient stated he would keep [place] informed with changes but was comfortable with Dr. Orvan Falconer managing his coumadin.

## 2016-10-10 ENCOUNTER — Other Ambulatory Visit: Payer: Self-pay

## 2016-10-10 DIAGNOSIS — R791 Abnormal coagulation profile: Secondary | ICD-10-CM | POA: Diagnosis not present

## 2016-10-10 NOTE — Patient Outreach (Signed)
Transition of care call: Placed call to patient. No answer. Left a message requesting a call back.  PLAN: Will continue to outreach patient weekly. Rowe Pavy, RN, BSN, CEN [facility] [employer] [phone]

## 2016-10-16 ENCOUNTER — Other Ambulatory Visit: Payer: Self-pay

## 2016-10-16 ENCOUNTER — Ambulatory Visit (HOSPITAL_COMMUNITY): Payer: Commercial Managed Care - HMO | Attending: Cardiology

## 2016-10-16 ENCOUNTER — Encounter: Payer: Self-pay | Admitting: Cardiovascular Disease

## 2016-10-16 ENCOUNTER — Ambulatory Visit (INDEPENDENT_AMBULATORY_CARE_PROVIDER_SITE_OTHER): Payer: Commercial Managed Care - HMO | Admitting: Cardiovascular Disease

## 2016-10-16 ENCOUNTER — Encounter (INDEPENDENT_AMBULATORY_CARE_PROVIDER_SITE_OTHER): Payer: Self-pay

## 2016-10-16 VITALS — BP 122/62 | HR 40 | Ht 68.0 in | Wt 169.0 lb

## 2016-10-16 DIAGNOSIS — I350 Nonrheumatic aortic (valve) stenosis: Secondary | ICD-10-CM | POA: Diagnosis not present

## 2016-10-16 DIAGNOSIS — Z953 Presence of xenogenic heart valve: Secondary | ICD-10-CM | POA: Insufficient documentation

## 2016-10-16 DIAGNOSIS — Z87891 Personal history of nicotine dependence: Secondary | ICD-10-CM | POA: Diagnosis not present

## 2016-10-16 DIAGNOSIS — I110 Hypertensive heart disease with heart failure: Secondary | ICD-10-CM | POA: Diagnosis not present

## 2016-10-16 DIAGNOSIS — I5022 Chronic systolic (congestive) heart failure: Secondary | ICD-10-CM | POA: Diagnosis not present

## 2016-10-16 DIAGNOSIS — I082 Rheumatic disorders of both aortic and tricuspid valves: Secondary | ICD-10-CM | POA: Diagnosis not present

## 2016-10-16 DIAGNOSIS — I4891 Unspecified atrial fibrillation: Secondary | ICD-10-CM | POA: Diagnosis not present

## 2016-10-16 DIAGNOSIS — I2510 Atherosclerotic heart disease of native coronary artery without angina pectoris: Secondary | ICD-10-CM | POA: Insufficient documentation

## 2016-10-16 DIAGNOSIS — I359 Nonrheumatic aortic valve disorder, unspecified: Secondary | ICD-10-CM | POA: Diagnosis present

## 2016-10-16 NOTE — Patient Outreach (Signed)
Transition of care call: Placed call to patient who answered. Reports that he saw cardiologist today for and echo. Reports that he is doing great. Reports that he is eating and sleeping well. Reports no swelling or shortness of breath. States he has been released to go back to work on 10/19/2016 for light duty.  States he is looking forward to this. Patient reports his only concern is loss of muscle strength.  States MD office is getting him set up for rehab.    Patient reports that Dr. Orvan Falconer is managing his INR.  Next lab on 10/23/2016 Reports weigh today of 166 pounds.   PLAN:  Discussed goals of care and completion of transition of care program on 10/19/2016.  Patient agreed to phone call from me on 10/20/2016 for potential case closure of he continues to improve.  Will call patient on 10/20/2016.  Rowe Pavy, RN, BSN, CEN [facility] [employer] [phone]

## 2016-10-16 NOTE — Patient Instructions (Signed)
Medication Instructions:  Your physician recommends that you continue on your current medications as directed. Please refer to the Current Medication list given to you today.   Labwork: none  Testing/Procedures:  Your physician has requested that you have an echocardiogram. Echocardiography is a painless test that uses sound waves to create images of your heart. It provides your doctor with information about the size and shape of your heart and how well your heart's chambers and valves are working. This procedure takes approximately one hour. There are no restrictions for this procedure.  To be done in 11 months on day of appointment with Dr. Clifton James.      Follow-Up: Your physician recommends that you schedule a follow-up appointment in: 11 months.  We will contact you to schedule this appointment.     Any Other Special Instructions Will Be Listed Below (If Applicable).     If you need a refill on your cardiac medications before your next appointment, please call your pharmacy.

## 2016-10-16 NOTE — Progress Notes (Signed)
Chief Complaint  Patient presents with  . Follow-up    echo     History of Present Illness: 65 yo male with history of systolic CHF, PAF, CAD, severe AS s/p TAVR, pericardial effusion who is here today for one month TAVR follow up. He was admitted to [facility] in October 2017 with cardiogenic shock and was found to have a large pericardial effusion, severe AS and severe LV systolic dysfunction. He was in atrial fibrillation. TEE with large LA appendage thrombus. He was managed by the CHF team with milrinone and diuresis. Pericardiocentesis was performed. Cardiac cath 08/29/16 with mild CAD. TAVR was performed 09/05/16. Follow up echo post TAVR with normal LVEF. A chest tube was placed during the TAVR to drain a persistent pleural effusion. This was complicated by a hemothorax the next day. He developed a hospital acquired pneumonia and was treated with antibiotics. He was discharged on 09/16/16.   He is here today for follow up. He feels great. He has no chest pain or dyspnea. Weight is down. No LE edema.   Primary Care Physician: Wilmer Floor., MD   Past Medical History:  Diagnosis Date  . Cardiomyopathy (HCC)    a. 04/2016 Echo: EF 20-25%.  . Cataract    right side per patient  . Cholelithiasis    a. 03/2016 Abd CT: 4.1 x 3.4 cm gallstone and other tiny stones in the gb fundus-->referred to GI.  Marland Kitchen Chronic bronchitis (HCC)   . Chronic pulmonary embolism (HCC)    a. 04/2016 CTA Chest: Sequelae of chronic pulm thromboembolism in RLL. No acute PE.  Marland Kitchen Chronic systolic CHF (congestive heart failure) (HCC)    a. 04/2016 Echo: EF 20-25%, mod LVH, diff HK w/ anterior, anteroseptal, and apical AK, sev AS (mean grad 40 mmHg, peak grad 66 mmHg), Asc Ao diameter 45 mm - ? flap (not seen on f/u CTA), mild MR, reduced RV fxn, sev dil RA, mod TR, PASP 50 mmHg, small to mod circumferential pericardial effusion.  Marland Kitchen GERD (gastroesophageal reflux disease)   . Heart murmur dx'd 1968  . Hepatic cirrhosis  (HCC)    a. 03/2016 Abd CT: evidence for hepatic cirrhosis.  . Hypertension   . Hypertensive heart disease with heart failure (HCC)   . Lower extremity edema    a. 04/2016 LE U/S: No DVT. Bilat greater saphenous vein incompetence.  . Multiple thyroid nodules    a. 05/2016 Thyroid U/S: Bilat nodules & cysts, largest in left thyroid lobe, 1.4 cm; solid nodule along the medial right thyroid lobe measuring 1.0 cm - rec u/s guided needle bx.  . Pericardial effusion    a. 03/2016 Abd CT: mod to large pericardial effusion; b. 04/2016 Echo: small to mod circumferential pericardial effusion.  . Persistent atrial fibrillation (HCC)    a. On eliquis (CHA2DS2VASc = 3).  . Pleural effusion on right    a. 03/2016 incidental finding on abd CT; b. 04/2016 R pl effusion noted on CTA Chest.  . Severe aortic stenosis     Past Surgical History:  Procedure Laterality Date  . BACK SURGERY    . CARDIAC CATHETERIZATION N/A 08/29/2016   Procedure: Right/Left Heart Cath and Coronary Angiography;  Surgeon: Laurey Morale, MD;  Location: [facility] INVASIVE CV LAB;  Service: Cardiovascular;  Laterality: N/A;  . CARDIAC CATHETERIZATION N/A 09/01/2016   Procedure: Pericardiocentesis;  Surgeon: Kathleene Hazel, MD;  Location: [facility] INVASIVE CV LAB;  Service: Cardiovascular;  Laterality: N/A;  . CHEST TUBE INSERTION  Right 09/05/2016   Procedure: CHEST TUBE INSERTION;  Surgeon: Kathleene Hazel, MD;  Location: [facility] OR;  Service: Open Heart Surgery;  Laterality: Right;  . goiter removed  ~ 1958  . LUMBAR DISC SURGERY  1968   "herniated disc"  . TEE WITHOUT CARDIOVERSION N/A 09/05/2016   Procedure: TRANSESOPHAGEAL ECHOCARDIOGRAM (TEE);  Surgeon: Kathleene Hazel, MD;  Location: [facility] OR;  Service: Open Heart Surgery;  Laterality: N/A;  . TONSILLECTOMY  1960  . TRANSCATHETER AORTIC VALVE REPLACEMENT, TRANSFEMORAL N/A 09/05/2016   Procedure: TRANSCATHETER AORTIC VALVE REPLACEMENT, TRANSFEMORAL;  Surgeon: Kathleene Hazel, MD;  Location: [facility] OR;  Service: Open Heart Surgery;  Laterality: N/A;    Current Outpatient Prescriptions  Medication Sig Dispense Refill  . amiodarone (PACERONE) 200 MG tablet Take 1 tablet (200 mg total) by mouth daily. 60 tablet 6  . atorvastatin (LIPITOR) 40 MG tablet Take 1 tablet (40 mg total) by mouth daily at 6 PM. 30 tablet 6  . carvedilol (COREG) 3.125 MG tablet Take 1 tablet (3.125 mg total) by mouth 2 (two) times daily. 60 tablet 3  . famotidine (PEPCID) 20 MG tablet Take 20 mg by mouth daily.    . furosemide (LASIX) 40 MG tablet Take 1 tablet (40 mg total) by mouth daily. 30 tablet 6  . ondansetron (ZOFRAN) 4 MG tablet Take 1 tablet (4 mg total) by mouth every 8 (eight) hours as needed for nausea or vomiting. 10 tablet 0  . spironolactone (ALDACTONE) 25 MG tablet Take 1 tablet (25 mg total) by mouth daily. 30 tablet 6  . warfarin (COUMADIN) 2 MG tablet Take 1 tablet (2 mg total) by mouth daily. 45 tablet 2   No current facility-administered medications for this visit.     Allergies  Allergen Reactions  . Potassium-Containing Compounds Other (See Comments)    Upset stomach    Social History   Social History  . Marital status: Divorced    Spouse name: N/A  . Number of children: N/A  . Years of education: N/A   Occupational History  . Not on file.   Social History Main Topics  . Smoking status: Former Smoker    Packs/day: 1.00    Years: 30.00    Types: Cigarettes    Quit date: 04/05/2000  . Smokeless tobacco: Never Used  . Alcohol use No     Comment: 08/24/2016 "stopped drinking in 1990"  . Drug use: No  . Sexual activity: Not Currently   Other Topics Concern  . Not on file   Social History Narrative  . No narrative on file    Family History  Problem Relation Age of Onset  . Cancer Mother   . Heart disease Father   . Alcohol abuse Brother   . Cancer Brother   . Cancer Maternal Grandmother   . Alcohol abuse Maternal Grandfather   .  Alcohol abuse Paternal Grandfather     Review of Systems:  As stated in the HPI and otherwise negative.   BP 122/62   Pulse (!) 40   Ht 5\' 8"  (1.727 m)   Wt 169 lb (76.7 kg)   BMI 25.70 kg/m   Physical Examination: General: Well developed, well nourished, NAD  HEENT: OP clear, mucus membranes moist  SKIN: warm, dry. No rashes. Neuro: No focal deficits  Musculoskeletal: Muscle strength 5/5 all ext  Psychiatric: Mood and affect normal  Neck: No JVD, no carotid bruits, no thyromegaly, no lymphadenopathy.  Lungs:Clear bilaterally, no wheezes, rhonci,  crackles Cardiovascular: Regular rate and rhythm. Soft systolic murmur. No gallops or rubs. Abdomen:Soft. Bowel sounds present. Non-tender.  Extremities: No lower extremity edema. Pulses are 2 + in the bilateral DP/PT.  Echo 10/16/16: Preliminary read by me shows low normal LV systolic function. Mild perivalvular leak. Expected gradient across the valve.   EKG:  EKG is not ordered today. The ekg ordered today demonstrates   Recent Labs: 09/09/2016: Magnesium 2.7 09/29/2016: ALT 21; B Natriuretic Peptide 1,270.9; BUN 12; Creatinine, Ser 1.03; Hemoglobin 11.0; Platelets 308; Potassium 4.2; Sodium 137; TSH 0.804    Wt Readings from Last 3 Encounters:  10/16/16 169 lb (76.7 kg)  10/02/16 166 lb (75.3 kg)  09/29/16 173 lb 12 oz (78.8 kg)     Other studies Reviewed: Additional studies/ records that were reviewed today include:  Review of the above records demonstrates:   Assessment and Plan:   1. Severe aortic valve stenosis: s/p TAVR 09/05/16. He is doing well. NYHA class 1. He has no chest pain or dyspnea. Echo today shows normally functioning bioprosthetic valve with mild leak.   2. Pericardial effusion: trivial effusion on echo today.   Current medicines are reviewed at length with the patient today.  The patient does not have concerns regarding medicines.  The following changes have been made:  no change  Labs/ tests  ordered today include:  No orders of the defined types were placed in this encounter.    Disposition:   FU with me in 12 months for one year TAVR follow up. Follow up Shirlee LatchMcLean one month.    Signed, Verne Carrowhristopher Jonnelle Lawniczak, MD 10/16/2016 11:26 AM    [facility] [address], [place], [place]  [address] Phone: [phone]; Fax: [phone]

## 2016-10-17 ENCOUNTER — Ambulatory Visit: Payer: Self-pay

## 2016-10-20 ENCOUNTER — Other Ambulatory Visit: Payer: Self-pay

## 2016-10-20 DIAGNOSIS — R791 Abnormal coagulation profile: Secondary | ICD-10-CM | POA: Diagnosis not present

## 2016-10-20 NOTE — Patient Outreach (Signed)
Final transition of care call: Placed call to patient for final transition of care. Patient answers and states that he is doing well. Reports he was able to go back to work last night for 5 hours without any difficulty. 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   Patient reports that he was seen at Dr. Hale Bogus office earlier today for an INR check.  Reports that he will have labs drawn again in 2 weeks.   Patient denies any new problems or concerns.   Reviewed with patient that he has completed transition of care program successfully.  Patient denies any other nursing needs at this time and has agreed to case closure.    PLAN: will close case.  Will send case closure letter to patient and MD. Will notify [facility] case management assistants of case closure/ goals met.  [facility] CM Care Plan Problem One   Flowsheet Row Most Recent Value  Care Plan Problem One  Recent admission for shortness of breath and fluid overload.  Role Documenting the Problem One  Care Management Diaz for Problem One  Active  [facility] Long Term Goal (31-90 days)  Patient will report no readmissions for the next 31 days.  [facility] Long Term Goal Start Date  09/19/16  [facility] Long Term Goal Met Date  10/20/16  Interventions for Problem One Long Term Goal  Again offered support and encouragement for daily self managment.  [facility] CM Short Term Goal #1 (0-30 days)  Patient will report attending MD appointment on 09/20/2016.  [facility] CM Short Term Goal #1 Start Date  09/19/16  [facility] CM Short Term Goal #1 Met Date  09/25/16  Interventions for Short Term Goal #1  Reviewed office visit time with patient.  [facility] CM Short Term Goal #2 (0-30 days)  Patient will report weighing daily for the next 30 days.  [facility] CM Short Term Goal #2 Start Date  09/19/16  [facility] CM Short Term Goal #2 Met Date  10/20/16  Interventions for Short Term Goal #2  Again reviewed heart failure zones and when to call MD. Reviewed low salt diet.       Tomasa Rand,  RN, BSN, CEN [facility] [employer] [phone]

## 2016-10-23 DIAGNOSIS — R748 Abnormal levels of other serum enzymes: Secondary | ICD-10-CM | POA: Diagnosis not present

## 2016-10-23 DIAGNOSIS — D649 Anemia, unspecified: Secondary | ICD-10-CM | POA: Diagnosis not present

## 2016-11-01 ENCOUNTER — Encounter (HOSPITAL_COMMUNITY): Payer: Commercial Managed Care - HMO

## 2016-11-03 DIAGNOSIS — R791 Abnormal coagulation profile: Secondary | ICD-10-CM | POA: Diagnosis not present

## 2016-11-14 DIAGNOSIS — I4891 Unspecified atrial fibrillation: Secondary | ICD-10-CM | POA: Diagnosis not present

## 2016-11-15 ENCOUNTER — Encounter (HOSPITAL_COMMUNITY): Payer: Commercial Managed Care - HMO

## 2016-11-20 ENCOUNTER — Ambulatory Visit (HOSPITAL_COMMUNITY)
Admission: RE | Admit: 2016-11-20 | Discharge: 2016-11-20 | Disposition: A | Discharge status: Home / Self Care | Payer: Commercial Managed Care - HMO | Source: Ambulatory Visit | Attending: Internal Medicine | Admitting: Internal Medicine

## 2016-11-20 VITALS — BP 126/66 | HR 72 | Wt 177.2 lb

## 2016-11-20 DIAGNOSIS — Z79899 Other long term (current) drug therapy: Secondary | ICD-10-CM | POA: Insufficient documentation

## 2016-11-20 DIAGNOSIS — I10 Essential (primary) hypertension: Secondary | ICD-10-CM

## 2016-11-20 DIAGNOSIS — I110 Hypertensive heart disease with heart failure: Secondary | ICD-10-CM | POA: Insufficient documentation

## 2016-11-20 DIAGNOSIS — I420 Dilated cardiomyopathy: Secondary | ICD-10-CM | POA: Diagnosis not present

## 2016-11-20 DIAGNOSIS — I5022 Chronic systolic (congestive) heart failure: Secondary | ICD-10-CM | POA: Diagnosis not present

## 2016-11-20 DIAGNOSIS — Z8249 Family history of ischemic heart disease and other diseases of the circulatory system: Secondary | ICD-10-CM | POA: Diagnosis not present

## 2016-11-20 DIAGNOSIS — I350 Nonrheumatic aortic (valve) stenosis: Secondary | ICD-10-CM

## 2016-11-20 DIAGNOSIS — Z87891 Personal history of nicotine dependence: Secondary | ICD-10-CM | POA: Diagnosis not present

## 2016-11-20 DIAGNOSIS — Z7901 Long term (current) use of anticoagulants: Secondary | ICD-10-CM | POA: Insufficient documentation

## 2016-11-20 DIAGNOSIS — J90 Pleural effusion, not elsewhere classified: Secondary | ICD-10-CM | POA: Diagnosis not present

## 2016-11-20 DIAGNOSIS — I5020 Unspecified systolic (congestive) heart failure: Secondary | ICD-10-CM

## 2016-11-20 DIAGNOSIS — I2510 Atherosclerotic heart disease of native coronary artery without angina pectoris: Secondary | ICD-10-CM | POA: Diagnosis not present

## 2016-11-20 DIAGNOSIS — K7460 Unspecified cirrhosis of liver: Secondary | ICD-10-CM | POA: Insufficient documentation

## 2016-11-20 DIAGNOSIS — I480 Paroxysmal atrial fibrillation: Secondary | ICD-10-CM | POA: Diagnosis not present

## 2016-11-20 DIAGNOSIS — Z953 Presence of xenogenic heart valve: Secondary | ICD-10-CM | POA: Diagnosis not present

## 2016-11-20 LAB — BRAIN NATRIURETIC PEPTIDE: B Natriuretic Peptide: 370 pg/mL — ABNORMAL HIGH (ref 0.0–100.0)

## 2016-11-20 LAB — BASIC METABOLIC PANEL
ANION GAP: 7 (ref 5–15)
BUN: 23 mg/dL — ABNORMAL HIGH (ref 6–20)
CHLORIDE: 102 mmol/L (ref 101–111)
CO2: 27 mmol/L (ref 22–32)
Calcium: 9.4 mg/dL (ref 8.9–10.3)
Creatinine, Ser: 1.28 mg/dL — ABNORMAL HIGH (ref 0.61–1.24)
GFR calc Af Amer: >60 mL/min (ref >60)
GFR, EST NON AFRICAN AMERICAN: 56 mL/min — AB (ref >60)
Glucose, Bld: 97 mg/dL (ref 65–99)
POTASSIUM: 4.3 mmol/L (ref 3.5–5.1)
SODIUM: 136 mmol/L (ref 135–145)

## 2016-11-20 NOTE — Patient Instructions (Signed)
Routine lab work today. Will notify you of abnormal results, otherwise no news is good news!  EKG today.  Follow up 2 months with Dr. Shirlee Latch.  Do the following things EVERYDAY: 1) Weigh yourself in the morning before breakfast. Write it down and keep it in a log. 2) Take your medicines as prescribed 3) Eat low salt foods-Limit salt (sodium) to 2000 mg per day.  4) Stay as active as you can everyday 5) Limit all fluids for the day to less than 2 liters

## 2016-11-20 NOTE — Progress Notes (Signed)
Advanced Heart Failure Clinic Note   PCP: Dr. Junious Dresser HF Cardiology: Dr. Shirlee Latch  66 yo with history of chronic systolic CHF, paroxysmal atrial fibrillation, CAD, and severe AS s/p TAVR presents for cardiology followup.  Patient was hospitalized in 10/17 with cardiogenic shock.  Echo showed EF 15% with severe AS and moderately decreased RV systolic function.  He was in persistent atrial fibrillation, TEE showed an LA appendage thrombus.  He was started on milrinone and diuresed. Moderate to large pericardial effusion was found and he had a pericardiocentesis. He had TAVR on 09/05/16.   Repeat echo on 09/07/16 showed EF improved to 50-55%.  Post-op from TAVR, he developed HCAP with septic shock.  He was treated with antibiotics and had right chest tube for pleural effusion.    He presents today for follow up. Weight up 4 lbs from last visit. Started on coreg at last visit. 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 Feeling great. Weight at home 172-175 lbs. Goes to visit [facility] tomorrow for Physical therapy. Getting up to frequently to urinate in the night. He is not sure if he is taking lasix in the morning or evening. No SOB at all, even with work. No bendopnea. No dizziness or lightheadedness. Eating well. PCP following INR.  No blood in stools, epistaxis, or melena.   ECG: NSR, PACs, T wave inversions laterally.   Labs (11/17): K 3.5, creatinine 1.08, hgb 8.5  PMH: 1. HTN 2. Cholelithiasis 3. Cirrhosis: No ETOH per his report since 1990.  4. Aortic stenosis: Severe.  S/p TAVR 10/17.  5. Atrial fibrillation: Paroxysmal. H/o LA appendage thrombus.  6. CAD: Moderate.  - LHC (10/17): 60% distal LAD, long 70% ostial D2, 50% proximal LCx, 40% RCA.  7. Pericardial effusion: S/p pericardiocentesis 10/17. 8. Pleural effusion on right: s/p chest tube 10/17.  9. Chronic systolic CHF: This appears to have been primarily valvular, related to severe AS.  - Echo  (08/26/16): EF 15%, severe AS, moderately decreased RV systolic function, moderate to large pericardial effusion.  - Echo (09/07/16): EF 50-55%, apical hypokinesis, bioprosthetic aortic valve with mean gradient 22 mmHg and no perivalvular AI.   Social History   Social History  . Marital status: Divorced    Spouse name: N/A  . Number of children: N/A  . Years of education: N/A   Occupational History  . Not on file.   Social History Main Topics  . Smoking status: Former Smoker    Packs/day: 1.00    Years: 30.00    Types: Cigarettes    Quit date: 04/05/2000  . Smokeless tobacco: Never Used  . Alcohol use No     Comment: 08/24/2016 "stopped drinking in 1990"  . Drug use: No  . Sexual activity: Not Currently   Other Topics Concern  . Not on file   Social History Narrative  . No narrative on file   Family History  Problem Relation Age of Onset  . Cancer Mother   . Heart disease Father   . Alcohol abuse Brother   . Cancer Brother   . Cancer Maternal Grandmother   . Alcohol abuse Maternal Grandfather   . Alcohol abuse Paternal Grandfather    ROS: All systems reviewed and negative except as per HPI.   Current Outpatient Prescriptions  Medication Sig Dispense Refill  . amiodarone (PACERONE) 200 MG tablet Take 1 tablet (200 mg total) by mouth daily. 60 tablet 6  . atorvastatin (LIPITOR) 40 MG tablet Take  1 tablet (40 mg total) by mouth daily at 6 PM. 30 tablet 6  . carvedilol (COREG) 3.125 MG tablet Take 1 tablet (3.125 mg total) by mouth 2 (two) times daily. 60 tablet 3  . famotidine (PEPCID) 20 MG tablet Take 20 mg by mouth daily.    . furosemide (LASIX) 40 MG tablet Take 1 tablet (40 mg total) by mouth daily. 30 tablet 6  . ondansetron (ZOFRAN) 4 MG tablet Take 1 tablet (4 mg total) by mouth every 8 (eight) hours as needed for nausea or vomiting. 10 tablet 0  . spironolactone (ALDACTONE) 25 MG tablet Take 1 tablet (25 mg total) by mouth daily. 30 tablet 6  . warfarin  (COUMADIN) 2 MG tablet Take 1 tablet (2 mg total) by mouth daily. 45 tablet 2   No current facility-administered medications for this encounter.    BP 126/66 (BP Location: Left Arm, Patient Position: Sitting, Cuff Size: Normal)   Pulse 72   Wt 177 lb 3.2 oz (80.4 kg)   SpO2 97%   BMI 26.94 kg/m    Wt Readings from Last 3 Encounters:  11/20/16 177 lb 3.2 oz (80.4 kg)  10/20/16 165 lb (74.8 kg)  10/16/16 169 lb (76.7 kg)    General: NAD HEENT: Normal Neck: Supple. JVP 6-7 cm. No thyromegaly or thyroid nodule. No carotid bruit. Lungs: Clear, normal effort CV: Nondisplaced PMI.  Heart regular S1/S2, no S3/S4, 2/6 early SEM RUSB.   Abdomen: Soft, NT, ND, no HSM. No bruits or masses. +BS  Skin: Intact without lesions or rashes.  Neurologic: Alert and oriented x 3.  Psych: Normal affect. Extremities: No clubbing or cyanosis. No peripheral edema.   EKG:  Sinus bradycardia 59 bpm, LVH   Assessment/Plan: 1. Aortic stenosis: S/p TAVR.  Echo on 09/07/16 showed well-seated bioprosthetic aortic valve with no significant perivalvular regurgitation. Mean gradient was mildly elevated and will have to be followed.   - Starts cardiac rehab at [facility] tomorrow.  2. Chronic systolic CHF: EF 16% on 08/26/16 with moderate RV dysfunction. Suspect that this was primarily valvular due to severe AS as EF improved to 50-55% range after TAVR.  - Volume status stable on exam. NYHA II symptoms.   - Continue Lasix 40 mg daily. Do not think urinary frequency is due to lasix. Have asked him to make sure he is taking lasix in am, if he is, needs to see PCP => urologist with frequency.  - Continue coreg 3.125 mg BID today.   No room to uptitrate with bradycardia.  - Continue spironolactone 25 mg daily.  3. Atrial fibrillation:  - Sinus brady by EKG today. - Of note, he had an LA appendage thrombus noted by TEE.  - Continue amiodarone 200 mg daily. LFTs and TSH stable at last check.  - Will need  regular eye exams.   - Continue warfarin, followed by PCP.   4. Pleural effusion: - CXR in 09/2016 with decrease from prior. Sounds clear on exam today.   5. CAD: Moderate CAD without interventional target on 10/17 cath.  - No ASA on coumadin with stable CAD.   - Continue atorvastatin 40 mg daily.  Lipids next visit.   Graciella Freer, PA-C  11/20/2016

## 2016-11-28 DIAGNOSIS — Z7901 Long term (current) use of anticoagulants: Secondary | ICD-10-CM | POA: Diagnosis not present

## 2016-12-21 DIAGNOSIS — N183 Chronic kidney disease, stage 3 (moderate): Secondary | ICD-10-CM | POA: Diagnosis not present

## 2016-12-21 DIAGNOSIS — I509 Heart failure, unspecified: Secondary | ICD-10-CM | POA: Diagnosis not present

## 2016-12-21 DIAGNOSIS — I350 Nonrheumatic aortic (valve) stenosis: Secondary | ICD-10-CM | POA: Diagnosis not present

## 2016-12-21 DIAGNOSIS — Z125 Encounter for screening for malignant neoplasm of prostate: Secondary | ICD-10-CM | POA: Diagnosis not present

## 2016-12-21 DIAGNOSIS — Z6828 Body mass index (BMI) 28.0-28.9, adult: Secondary | ICD-10-CM | POA: Diagnosis not present

## 2016-12-21 DIAGNOSIS — Z79899 Other long term (current) drug therapy: Secondary | ICD-10-CM | POA: Diagnosis not present

## 2016-12-21 DIAGNOSIS — I4891 Unspecified atrial fibrillation: Secondary | ICD-10-CM | POA: Diagnosis not present

## 2016-12-29 ENCOUNTER — Other Ambulatory Visit (HOSPITAL_COMMUNITY): Payer: Self-pay | Admitting: Cardiology

## 2016-12-29 DIAGNOSIS — R791 Abnormal coagulation profile: Secondary | ICD-10-CM | POA: Diagnosis not present

## 2017-01-04 DIAGNOSIS — I4891 Unspecified atrial fibrillation: Secondary | ICD-10-CM | POA: Diagnosis not present

## 2017-01-18 ENCOUNTER — Encounter (HOSPITAL_COMMUNITY): Payer: Self-pay

## 2017-01-18 ENCOUNTER — Ambulatory Visit (HOSPITAL_COMMUNITY)
Admission: RE | Admit: 2017-01-18 | Discharge: 2017-01-18 | Disposition: A | Discharge status: Home / Self Care | Payer: Medicare HMO | Source: Ambulatory Visit | Attending: Cardiology | Admitting: Cardiology

## 2017-01-18 ENCOUNTER — Ambulatory Visit (HOSPITAL_BASED_OUTPATIENT_CLINIC_OR_DEPARTMENT_OTHER)
Admission: RE | Admit: 2017-01-18 | Discharge: 2017-01-18 | Disposition: A | Discharge status: Home / Self Care | Payer: Medicare HMO | Source: Ambulatory Visit | Attending: Cardiology | Admitting: Cardiology

## 2017-01-18 VITALS — BP 130/77 | HR 55 | Wt 183.5 lb

## 2017-01-18 DIAGNOSIS — Z952 Presence of prosthetic heart valve: Secondary | ICD-10-CM | POA: Diagnosis not present

## 2017-01-18 DIAGNOSIS — I4819 Other persistent atrial fibrillation: Secondary | ICD-10-CM

## 2017-01-18 DIAGNOSIS — I5022 Chronic systolic (congestive) heart failure: Secondary | ICD-10-CM

## 2017-01-18 DIAGNOSIS — I481 Persistent atrial fibrillation: Secondary | ICD-10-CM | POA: Diagnosis not present

## 2017-01-18 DIAGNOSIS — Z87891 Personal history of nicotine dependence: Secondary | ICD-10-CM | POA: Diagnosis not present

## 2017-01-18 DIAGNOSIS — Z811 Family history of alcohol abuse and dependence: Secondary | ICD-10-CM | POA: Diagnosis not present

## 2017-01-18 DIAGNOSIS — I2510 Atherosclerotic heart disease of native coronary artery without angina pectoris: Secondary | ICD-10-CM | POA: Insufficient documentation

## 2017-01-18 DIAGNOSIS — Z7901 Long term (current) use of anticoagulants: Secondary | ICD-10-CM | POA: Diagnosis not present

## 2017-01-18 DIAGNOSIS — Z8249 Family history of ischemic heart disease and other diseases of the circulatory system: Secondary | ICD-10-CM | POA: Insufficient documentation

## 2017-01-18 DIAGNOSIS — K7460 Unspecified cirrhosis of liver: Secondary | ICD-10-CM | POA: Insufficient documentation

## 2017-01-18 DIAGNOSIS — I480 Paroxysmal atrial fibrillation: Secondary | ICD-10-CM | POA: Diagnosis not present

## 2017-01-18 DIAGNOSIS — Z79899 Other long term (current) drug therapy: Secondary | ICD-10-CM | POA: Insufficient documentation

## 2017-01-18 DIAGNOSIS — I110 Hypertensive heart disease with heart failure: Secondary | ICD-10-CM | POA: Diagnosis not present

## 2017-01-18 DIAGNOSIS — Z953 Presence of xenogenic heart valve: Secondary | ICD-10-CM | POA: Insufficient documentation

## 2017-01-18 LAB — ECHOCARDIOGRAM COMPLETE: Weight: 2936 oz

## 2017-01-18 LAB — TSH: TSH: 0.854 u[IU]/mL (ref 0.350–4.500)

## 2017-01-18 NOTE — Progress Notes (Signed)
Advanced Heart Failure Clinic Note   PCP: Dr. Junious Munoz HF Cardiology: Dr. Shirlee Munoz  66 yo with history of chronic systolic CHF, paroxysmal atrial fibrillation, CAD, and severe AS s/p TAVR presents for cardiology followup.  Patient was hospitalized in 10/17 with cardiogenic shock.  Echo showed EF 15% with severe AS and moderately decreased RV systolic function.  He was in persistent atrial fibrillation, TEE showed an LA appendage thrombus.  He was started on milrinone and diuresed. Moderate to large pericardial effusion was found and he had a pericardiocentesis. He had TAVR on 09/05/16.   Repeat echo on 09/07/16 showed EF improved to 50-55%.  Post-op from TAVR, he developed HCAP with septic shock.  He was treated with antibiotics and had right chest tube for pleural effusion.    He presents today for followup. Weight up 6 lbs from last visit. He has been eating well and working out at gym. Back to work at [employer]. Feeling great. No BRBPR/melena, PCP following warfarin.  No exertional dyspnea.  No chest pain.  Overall, very active. No orthopnea/PND.  No lightheadedness, no palpitations. He is in NSR today.   ECG (reviewed personally): NSR, iLBBB  Labs (11/17): K 3.5, creatinine 1.08, hgb 8.5 Labs (1/18): K 4.3, creatinine 1.28, BNP 370  PMH: 1. HTN 2. Cholelithiasis 3. Cirrhosis: No ETOH per his report since 1990.  4. Aortic stenosis: Severe.  S/p TAVR 10/17.  5. Atrial fibrillation: Paroxysmal. H/o LA appendage thrombus.  6. CAD: Moderate.  - LHC (10/17): 60% distal LAD, long 70% ostial D2, 50% proximal LCx, 40% RCA.  7. Pericardial effusion: S/p pericardiocentesis 10/17. 8. Pleural effusion on right: s/p chest tube 10/17.  9. Chronic systolic CHF: This appears to have been primarily valvular, related to severe AS.  - Echo (08/26/16): EF 15%, severe AS, moderately decreased RV systolic function, moderate to large pericardial effusion.  - Echo (09/07/16): EF 50-55%, apical  hypokinesis, bioprosthetic aortic valve with mean gradient 22 mmHg and no perivalvular AI.   Social History   Social History  . Marital status: Divorced    Spouse name: N/A  . Number of children: N/A  . Years of education: N/A   Occupational History  . Not on file.   Social History Main Topics  . Smoking status: Former Smoker    Packs/day: 1.00    Years: 30.00    Types: Cigarettes    Quit date: 04/05/2000  . Smokeless tobacco: Never Used  . Alcohol use No     Comment: 08/24/2016 "stopped drinking in 1990"  . Drug use: No  . Sexual activity: Not Currently   Other Topics Concern  . Not on file   Social History Narrative  . No narrative on file   Family History  Problem Relation Age of Onset  . Cancer Mother   . Heart disease Father   . Alcohol abuse Brother   . Cancer Brother   . Cancer Maternal Grandmother   . Alcohol abuse Maternal Grandfather   . Alcohol abuse Paternal Grandfather    ROS: All systems reviewed and negative except as per HPI.   Current Outpatient Prescriptions  Medication Sig Dispense Refill  . amiodarone (PACERONE) 200 MG tablet Take 1 tablet (200 mg total) by mouth daily. 60 tablet 6  . atorvastatin (LIPITOR) 40 MG tablet Take 1 tablet (40 mg total) by mouth daily at 6 PM. 30 tablet 6  . carvedilol (COREG) 3.125 MG tablet TAKE ONE TABLET BY MOUTH TWICE DAILY 60 tablet  6  . famotidine (PEPCID) 20 MG tablet Take 20 mg by mouth daily.    . furosemide (LASIX) 40 MG tablet Take 1 tablet (40 mg total) by mouth daily. 30 tablet 6  . ondansetron (ZOFRAN) 4 MG tablet Take 1 tablet (4 mg total) by mouth every 8 (eight) hours as needed for nausea or vomiting. 10 tablet 0  . spironolactone (ALDACTONE) 25 MG tablet Take 1 tablet (25 mg total) by mouth daily. 30 tablet 6  . warfarin (COUMADIN) 2 MG tablet Take 1 tablet (2 mg total) by mouth daily. 45 tablet 2   No current facility-administered medications for this encounter.    BP 130/77   Pulse (!) 55    Wt 183 lb 8 oz (83.2 kg)   SpO2 100%   BMI 27.90 kg/m    Wt Readings from Last 3 Encounters:  01/18/17 183 lb 8 oz (83.2 kg)  11/20/16 177 lb 3.2 oz (80.4 kg)  10/20/16 165 lb (74.8 kg)    General: NAD HEENT: Normal Neck: Supple. JVP 7 cm. No thyromegaly or thyroid nodule. No carotid bruit. Lungs: Clear, normal effort CV: Nondisplaced PMI.  Heart regular S1/S2, no S3/S4, 2/6 early SEM RUSB with clear S2.   Abdomen: Soft, NT, ND, no HSM. No bruits or masses. +BS  Skin: Intact without lesions or rashes.  Neurologic: Alert and oriented x 3.  Psych: Normal affect. Extremities: No clubbing or cyanosis. No peripheral edema.   Assessment/Plan: 1. Aortic stenosis: S/p TAVR.  Echo on 09/07/16 showed well-seated bioprosthetic aortic valve with no significant perivalvular regurgitation. Mean gradient was mildly elevated and will have to be followed.   - We will get a repeat echocardiogram today.  2. Chronic systolic CHF: EF 15% on 08/26/16 with moderate RV dysfunction. Suspect that this was primarily valvular due to severe AS as EF improved to 50-55% range after TAVR.  He is euvolemic with NYHA class II symptoms.  - Continue Lasix 40 mg bid. Will get recent BMET from PCP.  - Continue coreg 3.125 mg BID today.    - Continue spironolactone 25 mg daily.  - He will get an echo done today.  If EF is low, will need to add ACEI/ARB/ARNI.  3. Atrial fibrillation: NSR today.  Of note, he had an LA appendage thrombus noted by TEE during last hospitalization.  - Continue amiodarone 200 mg daily. Check TSH today.  Will get LFTs from PCP (recently checked).  He will need regular eye exams.   - Continue warfarin, followed by PCP (will get CBC from PCP => recently done).   4. CAD: Moderate CAD without interventional target on 10/17 cath.  - No ASA as on coumadin with stable CAD.   - Continue atorvastatin 40 mg daily.  PCP did lipids recently, I will call to get a copy.    Lucas Ancona, MD  01/18/2017

## 2017-01-18 NOTE — Progress Notes (Signed)
  Echocardiogram 2D Echocardiogram has been performed.  Arvil Chaco 01/18/2017, 2:17 PM

## 2017-01-18 NOTE — Patient Instructions (Signed)
Your physician has requested that you have an echocardiogram. Echocardiography is a painless test that uses sound waves to create images of your heart. It provides your doctor with information about the size and shape of your heart and how well your heart's chambers and valves are working. This procedure takes approximately one hour. There are no restrictions for this procedure.  TODAY  Your physician recommends that you schedule a follow-up appointment in: 2 months  

## 2017-01-23 ENCOUNTER — Other Ambulatory Visit (HOSPITAL_COMMUNITY): Payer: Self-pay | Admitting: *Deleted

## 2017-01-23 DIAGNOSIS — K8020 Calculus of gallbladder without cholecystitis without obstruction: Secondary | ICD-10-CM

## 2017-01-23 MED ORDER — ATORVASTATIN CALCIUM 40 MG PO TABS: 40.0000 mg | ORAL_TABLET | Freq: Every day | ORAL | 2 refills | Status: AC

## 2017-01-23 MED ORDER — FUROSEMIDE 40 MG PO TABS: 40.0000 mg | ORAL_TABLET | Freq: Every day | ORAL | 2 refills | Status: AC

## 2017-01-23 MED ORDER — SPIRONOLACTONE 25 MG PO TABS: 25.0000 mg | ORAL_TABLET | Freq: Every day | ORAL | 2 refills | Status: AC

## 2017-01-23 MED ORDER — CARVEDILOL 3.125 MG PO TABS: 3.1250 mg | ORAL_TABLET | Freq: Two times a day (BID) | ORAL | 2 refills | Status: AC

## 2017-01-23 MED ORDER — AMIODARONE HCL 200 MG PO TABS: 200.0000 mg | ORAL_TABLET | Freq: Every day | ORAL | 2 refills | Status: AC

## 2017-02-01 DIAGNOSIS — I4891 Unspecified atrial fibrillation: Secondary | ICD-10-CM | POA: Diagnosis not present

## 2017-02-23 ENCOUNTER — Other Ambulatory Visit (HOSPITAL_COMMUNITY): Payer: Self-pay | Admitting: Internal Medicine

## 2017-03-05 DIAGNOSIS — Z7901 Long term (current) use of anticoagulants: Secondary | ICD-10-CM | POA: Diagnosis not present

## 2017-03-20 ENCOUNTER — Inpatient Hospital Stay (HOSPITAL_COMMUNITY): Admission: RE | Admit: 2017-03-20 | Payer: Commercial Managed Care - HMO | Source: Ambulatory Visit

## 2017-03-23 ENCOUNTER — Telehealth: Payer: Self-pay | Admitting: Cardiovascular Disease

## 2017-03-23 NOTE — Telephone Encounter (Signed)
New message      [place] leaving out of state [place] 6 [place] is there anything he need to do before he leave?

## 2017-03-23 NOTE — Telephone Encounter (Signed)
Spoke w/pt he missed appt with our office 5/8, he apoligized and states he did not realize he had missed an appt.  He states he is going to [place] for 6 months to help his sister in law.  Scheduled an appt for next Friday 5/18 at 10:30.  He states he is seeing his pcp on wed 5/16 and is planning to get a pcp while in [place] so they can manage his INR

## 2017-03-26 IMAGING — US US RENAL
1 series · 14 of 25 positions shown · non-contrast
Comparison: CT abdomen pelvis 04/11/2016

CLINICAL DATA: Acute renal failure

EXAM:
RENAL / URINARY TRACT ULTRASOUND COMPLETE

[Series 1: us renal · 0.26mm/px · 14 of 26 slices shown]
[im 1/26]
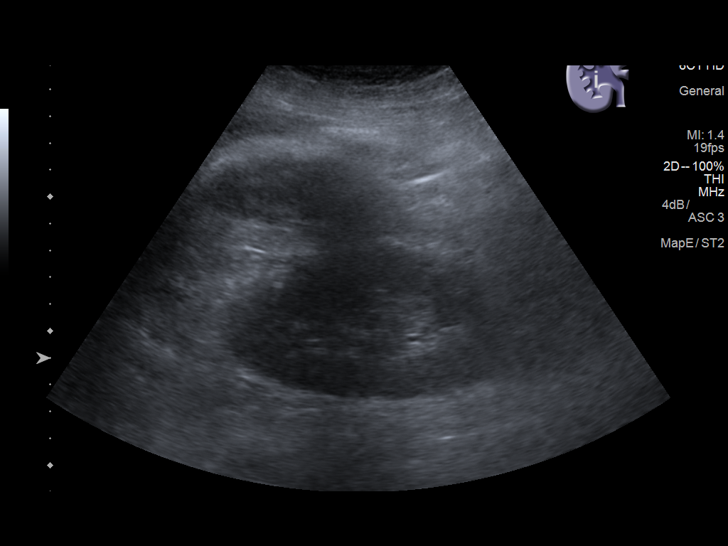
[im 3/26]
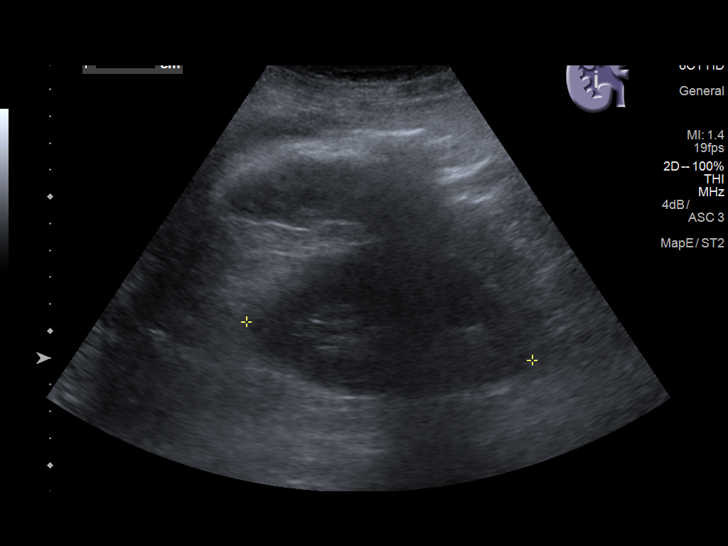
[im 5/26]
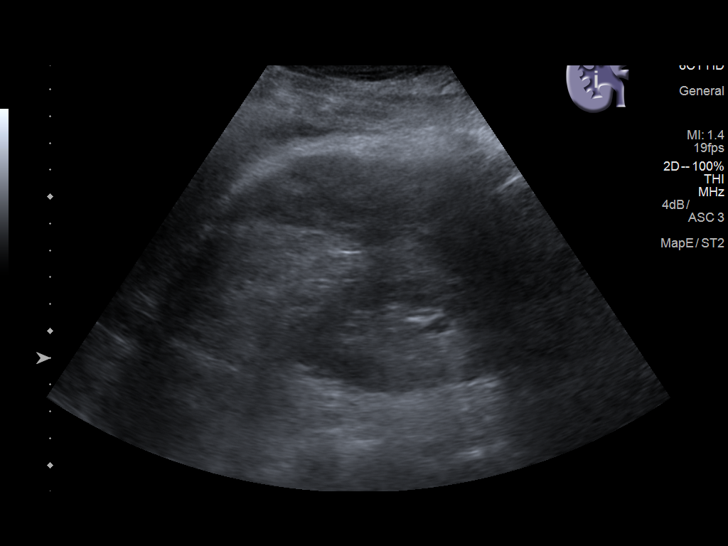
[im 7/26]
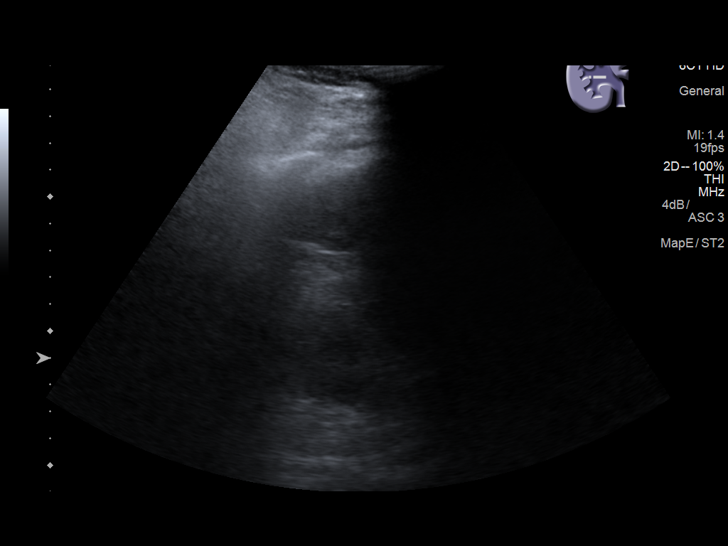
[im 9/26]
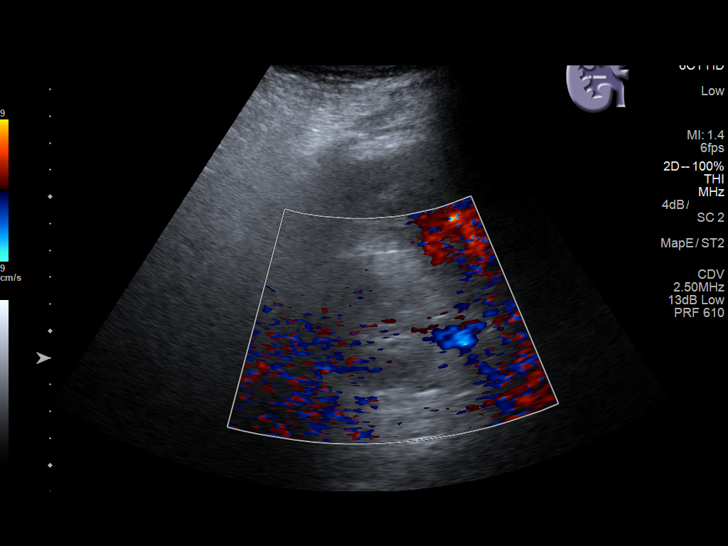
[im 10/26]
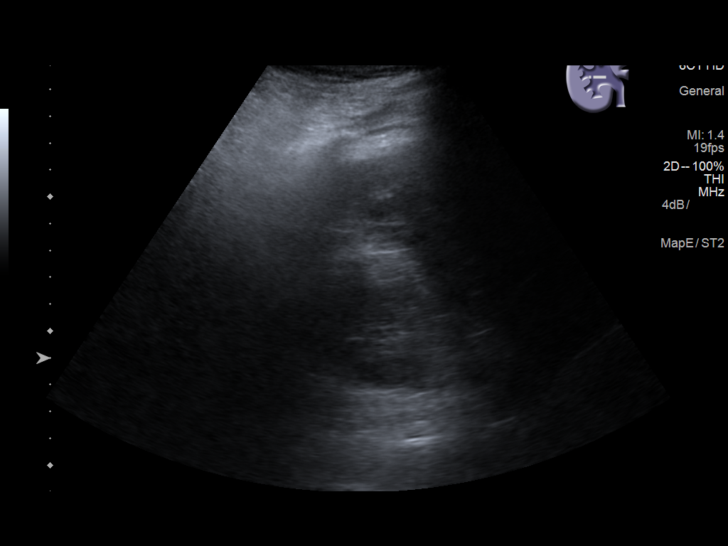
[im 12/26]
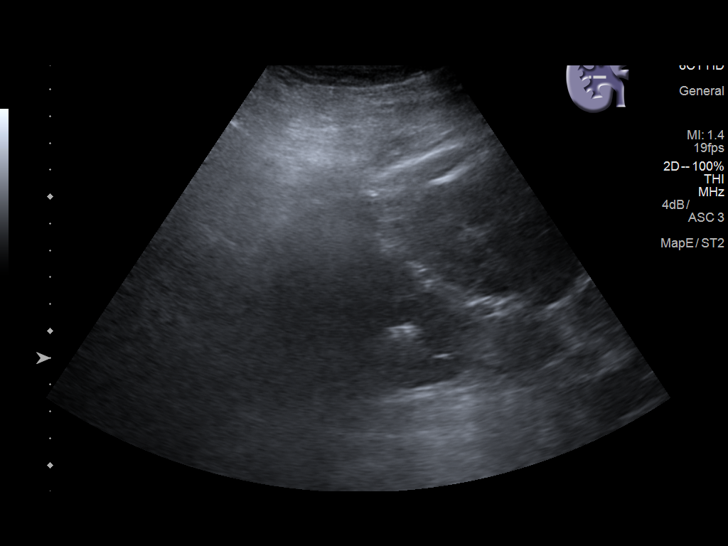
[im 14/26]
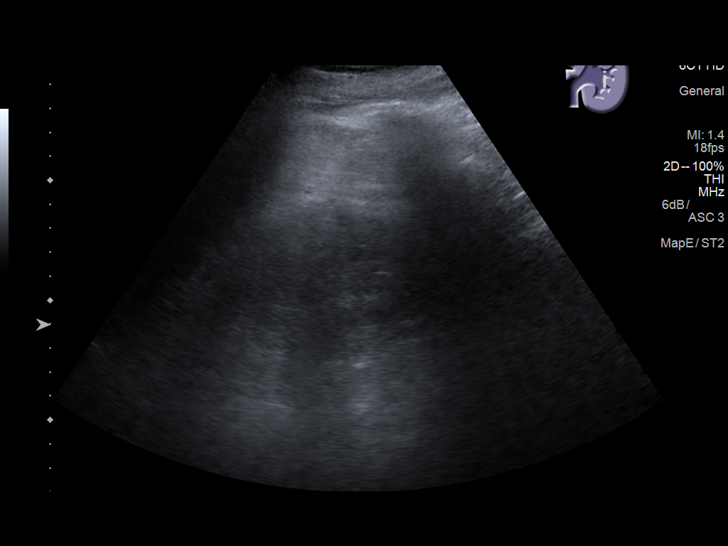
[im 16/26]
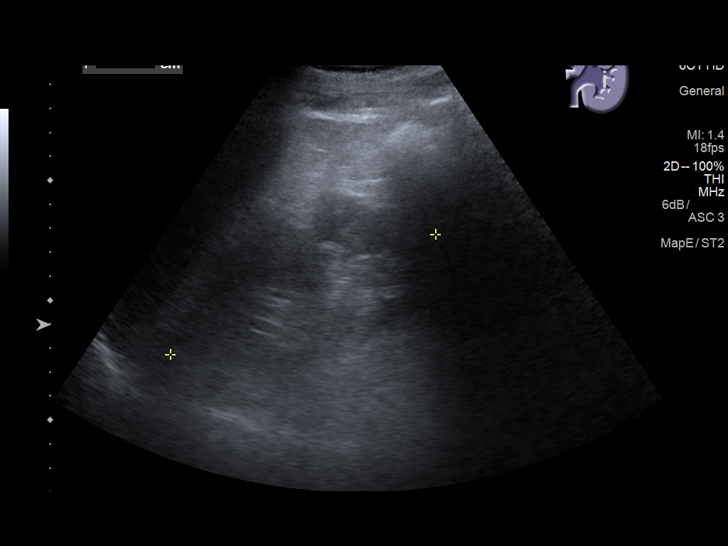
[im 17/26]
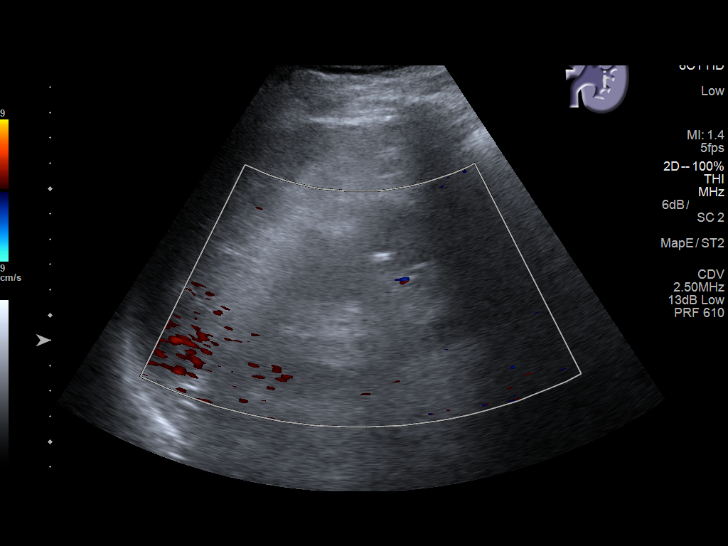
[im 19/26]
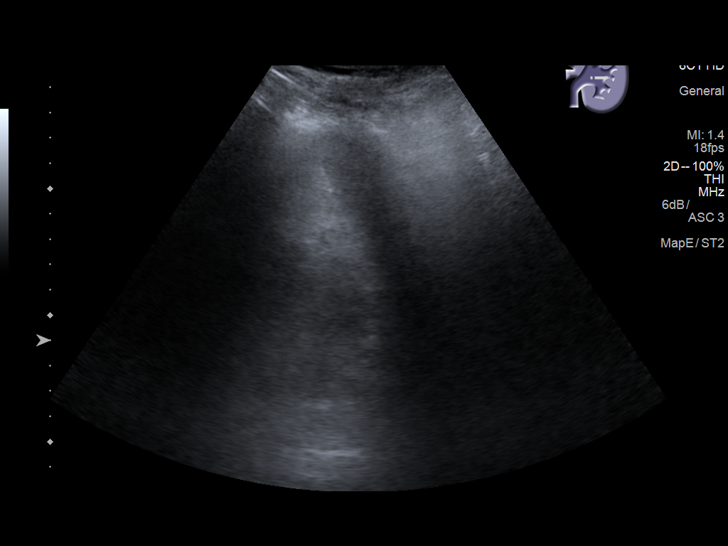
[im 21/26]
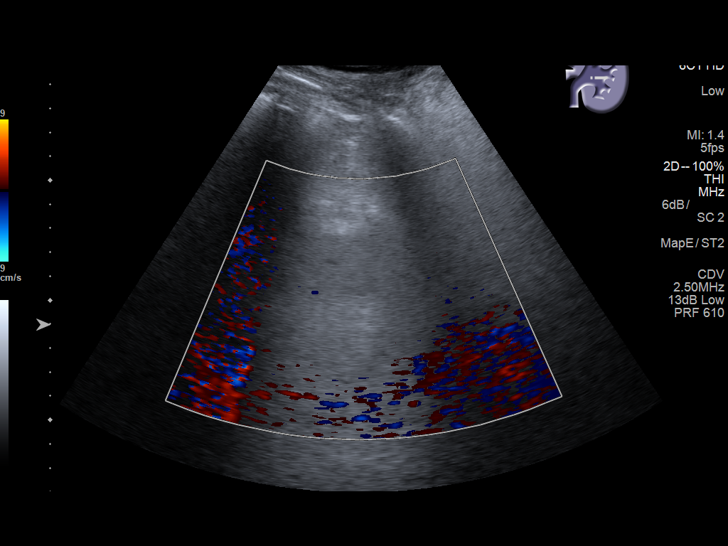
[im 23/26]
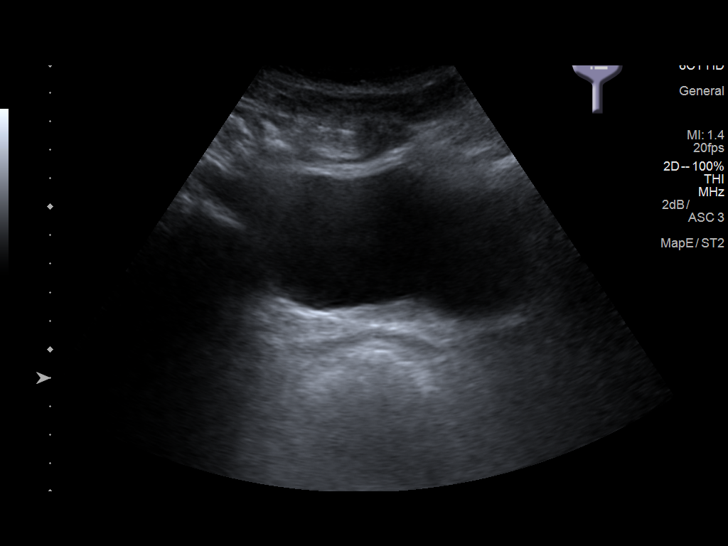
[im 26/26]
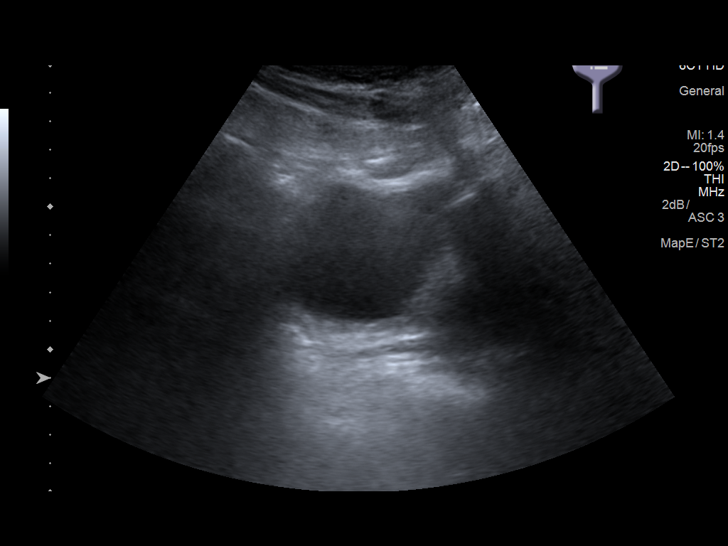

[14 of 25 positions shown; findings below may reference images not displayed]

FINDINGS: Right Kidney:

Length: 10.8 cm.. Negative for obstruction or mass. Limited
evaluation the right kidney due to chest tube and bandages.

Left Kidney:

Length: 12.2 cm. Echogenicity within normal limits. No mass or
hydronephrosis visualized.

Bladder:

Appears normal for degree of bladder distention.
IMPRESSION: Negative renal ultrasound.

## 2017-03-28 DIAGNOSIS — N183 Chronic kidney disease, stage 3 (moderate): Secondary | ICD-10-CM | POA: Diagnosis not present

## 2017-03-28 DIAGNOSIS — I10 Essential (primary) hypertension: Secondary | ICD-10-CM | POA: Diagnosis not present

## 2017-03-28 DIAGNOSIS — Z79899 Other long term (current) drug therapy: Secondary | ICD-10-CM | POA: Diagnosis not present

## 2017-03-28 DIAGNOSIS — I150 Renovascular hypertension: Secondary | ICD-10-CM | POA: Diagnosis not present

## 2017-03-28 DIAGNOSIS — I4891 Unspecified atrial fibrillation: Secondary | ICD-10-CM | POA: Diagnosis not present

## 2017-03-28 DIAGNOSIS — I509 Heart failure, unspecified: Secondary | ICD-10-CM | POA: Diagnosis not present

## 2017-03-28 DIAGNOSIS — Z6828 Body mass index (BMI) 28.0-28.9, adult: Secondary | ICD-10-CM | POA: Diagnosis not present

## 2017-03-28 DIAGNOSIS — E663 Overweight: Secondary | ICD-10-CM | POA: Diagnosis not present

## 2017-03-28 DIAGNOSIS — Z953 Presence of xenogenic heart valve: Secondary | ICD-10-CM | POA: Diagnosis not present

## 2017-03-30 ENCOUNTER — Ambulatory Visit (HOSPITAL_COMMUNITY)
Admission: RE | Admit: 2017-03-30 | Discharge: 2017-03-30 | Disposition: A | Discharge status: Home / Self Care | Payer: Commercial Managed Care - HMO | Source: Ambulatory Visit | Attending: Cardiology | Admitting: Cardiology

## 2017-03-30 ENCOUNTER — Encounter (HOSPITAL_COMMUNITY): Payer: Self-pay

## 2017-03-30 ENCOUNTER — Telehealth (HOSPITAL_COMMUNITY): Payer: Self-pay

## 2017-03-30 VITALS — BP 135/64 | HR 62 | Wt 192.1 lb

## 2017-03-30 DIAGNOSIS — I4819 Other persistent atrial fibrillation: Secondary | ICD-10-CM

## 2017-03-30 DIAGNOSIS — I480 Paroxysmal atrial fibrillation: Secondary | ICD-10-CM | POA: Diagnosis not present

## 2017-03-30 DIAGNOSIS — I5022 Chronic systolic (congestive) heart failure: Secondary | ICD-10-CM | POA: Diagnosis not present

## 2017-03-30 DIAGNOSIS — I420 Dilated cardiomyopathy: Secondary | ICD-10-CM | POA: Diagnosis not present

## 2017-03-30 DIAGNOSIS — K7460 Unspecified cirrhosis of liver: Secondary | ICD-10-CM | POA: Diagnosis not present

## 2017-03-30 DIAGNOSIS — Z811 Family history of alcohol abuse and dependence: Secondary | ICD-10-CM | POA: Insufficient documentation

## 2017-03-30 DIAGNOSIS — Z87891 Personal history of nicotine dependence: Secondary | ICD-10-CM | POA: Diagnosis not present

## 2017-03-30 DIAGNOSIS — I10 Essential (primary) hypertension: Secondary | ICD-10-CM

## 2017-03-30 DIAGNOSIS — I481 Persistent atrial fibrillation: Secondary | ICD-10-CM

## 2017-03-30 DIAGNOSIS — Z7901 Long term (current) use of anticoagulants: Secondary | ICD-10-CM | POA: Diagnosis not present

## 2017-03-30 DIAGNOSIS — Z952 Presence of prosthetic heart valve: Secondary | ICD-10-CM | POA: Insufficient documentation

## 2017-03-30 DIAGNOSIS — I350 Nonrheumatic aortic (valve) stenosis: Secondary | ICD-10-CM | POA: Diagnosis not present

## 2017-03-30 DIAGNOSIS — Z8249 Family history of ischemic heart disease and other diseases of the circulatory system: Secondary | ICD-10-CM | POA: Insufficient documentation

## 2017-03-30 DIAGNOSIS — I2510 Atherosclerotic heart disease of native coronary artery without angina pectoris: Secondary | ICD-10-CM | POA: Insufficient documentation

## 2017-03-30 DIAGNOSIS — I509 Heart failure, unspecified: Secondary | ICD-10-CM | POA: Diagnosis not present

## 2017-03-30 DIAGNOSIS — Z79899 Other long term (current) drug therapy: Secondary | ICD-10-CM | POA: Diagnosis not present

## 2017-03-30 DIAGNOSIS — Z809 Family history of malignant neoplasm, unspecified: Secondary | ICD-10-CM | POA: Insufficient documentation

## 2017-03-30 DIAGNOSIS — I110 Hypertensive heart disease with heart failure: Secondary | ICD-10-CM | POA: Insufficient documentation

## 2017-03-30 LAB — BASIC METABOLIC PANEL
ANION GAP: 9 (ref 5–15)
BUN: 12 mg/dL (ref 6–20)
CALCIUM: 8.4 mg/dL — AB (ref 8.9–10.3)
CO2: 30 mmol/L (ref 22–32)
Chloride: 102 mmol/L (ref 101–111)
Creatinine, Ser: 1.24 mg/dL (ref 0.61–1.24)
GFR, EST NON AFRICAN AMERICAN: 58 mL/min — AB (ref >60)
GLUCOSE: 112 mg/dL — AB (ref 65–99)
POTASSIUM: 2.9 mmol/L — AB (ref 3.5–5.1)
SODIUM: 141 mmol/L (ref 135–145)

## 2017-03-30 NOTE — Progress Notes (Signed)
Advanced Heart Failure Medication Review by a Pharmacist  Does the patient  feel that his/her medications are working for him/her?  yes  Has the patient been experiencing any side effects to the medications prescribed?  no  Does the patient measure his/her own blood pressure or blood glucose at home?  yes   Does the patient have any problems obtaining medications due to transportation or finances?   no  Understanding of regimen: good Understanding of indications: good Potential of compliance: good Patient understands to avoid NSAIDs. Patient understands to avoid decongestants.  Issues to address at subsequent visits: None   Pharmacist comments: Lucas Munoz is a pleasant 66 yo M presenting with his medication bottles. He reports great compliance with his regimen and did not have any specific medication-related questions or concerns for me at this time.   Tyler Deis. Bonnye Fava, PharmD, BCPS, CPP Clinical Pharmacist Pager: [phone] Phone: [phone] 03/30/2017 10:44 AM       Time with patient: 10 minutes Preparation and documentation time: 2 minutes Total time: 12 minutes

## 2017-03-30 NOTE — Patient Instructions (Signed)
Routine lab work today. Will notify you of abnormal results, otherwise no news is good news!  No changes to medication at this time.  Follow up as needed next time you are in town with Dr. Shirlee Latch.  Do the following things EVERYDAY: 1) Weigh yourself in the morning before breakfast. Write it down and keep it in a log. 2) Take your medicines as prescribed 3) Eat low salt foods-Limit salt (sodium) to 2000 mg per day.  4) Stay as active as you can everyday 5) Limit all fluids for the day to less than 2 liters

## 2017-03-30 NOTE — Telephone Encounter (Signed)
Basic metabolic panel  Order: [id number]  Status:  Final result Visible to patient:  No (Not Released) Dx:  Chronic systolic congestive heart fai...  Notes recorded by Chyrl Civatte, RN on 03/30/2017 at 1:22 PM EDT Patient reluctant to come back in for lab work and states he will have this rechecked as soon as possible once he gets to [place] with his PCP. Advised on high potassium/low salt diet. ------  Notes recorded by Graciella Freer, PA-C on 03/30/2017 at 11:58 AM EDT Refuses potassium supplementation. Increase K in diet. Can he recheck Monday before he leaves for [place]??    Doreatha Martin, PA-C 03/30/2017 11:58 AM

## 2017-03-30 NOTE — Progress Notes (Addendum)
Advanced Heart Failure Clinic Note   PCP: Dr. Junious Dresser HF Cardiology: Dr. Shirlee Latch  66 yo with history of chronic systolic CHF, paroxysmal atrial fibrillation, CAD, and severe AS s/p TAVR presents for cardiology followup.  Patient was hospitalized in 10/17 with cardiogenic shock.  Echo showed EF 15% with severe AS and moderately decreased RV systolic function.  He was in persistent atrial fibrillation, TEE showed an LA appendage thrombus.  He was started on milrinone and diuresed. Moderate to large pericardial effusion was found and he had a pericardiocentesis. He had TAVR on 09/05/16.   Repeat echo on 09/07/16 showed EF improved to 50-55%.  Post-op from TAVR, he developed HCAP with septic shock.  He was treated with antibiotics and had right chest tube for pleural effusion.    He presents today for follow up.  Weight up 9 lbs over past 2 months.  He asked for appt today as he is getting ready to move to [facility] for at least 6 months to help his sister who is getting double hip replacement, but he may stay up there. Denies any DOE whatsoever. Works at [employer] and can feel himself progressing. Denies lightheadedness or dizziness. Has contacted [facility] to get him a booklet of approved providers for him to see up there, Sister also can recommend PCP.   Labs (11/17): K 3.5, creatinine 1.08, hgb 8.5 Labs (1/18): K 4.3, creatinine 1.28, BNP 370  PMH: 1. HTN 2. Cholelithiasis 3. Cirrhosis: No ETOH per his report since 1990.  4. Aortic stenosis: Severe.  S/p TAVR 10/17.  5. Atrial fibrillation: Paroxysmal. H/o LA appendage thrombus.  6. CAD: Moderate.  - LHC (10/17): 60% distal LAD, long 70% ostial D2, 50% proximal LCx, 40% RCA.  7. Pericardial effusion: S/p pericardiocentesis 10/17. 8. Pleural effusion on right: s/p chest tube 10/17.  9. Chronic systolic CHF: This appears to have been primarily valvular, related to severe AS.  - Echo (08/26/16): EF 15%, severe AS, moderately decreased RV  systolic function, moderate to large pericardial effusion.  - Echo (09/07/16): EF 50-55%, apical hypokinesis, bioprosthetic aortic valve with mean gradient 22 mmHg and no perivalvular AI.   Social History   Social History  . Marital status: Divorced    Spouse name: N/A  . Number of children: N/A  . Years of education: N/A   Occupational History  . Not on file.   Social History Main Topics  . Smoking status: Former Smoker    Packs/day: 1.00    Years: 30.00    Types: Cigarettes    Quit date: 04/05/2000  . Smokeless tobacco: Never Used  . Alcohol use No     Comment: 08/24/2016 "stopped drinking in 1990"  . Drug use: No  . Sexual activity: Not Currently   Other Topics Concern  . Not on file   Social History Narrative  . No narrative on file   Family History  Problem Relation Age of Onset  . Cancer Mother   . Heart disease Father   . Alcohol abuse Brother   . Cancer Brother   . Cancer Maternal Grandmother   . Alcohol abuse Maternal Grandfather   . Alcohol abuse Paternal Grandfather    Review of systems complete and found to be negative unless listed in HPI.    Current Outpatient Prescriptions  Medication Sig Dispense Refill  . amiodarone (PACERONE) 200 MG tablet Take 1 tablet (200 mg total) by mouth daily. 90 tablet 2  . atorvastatin (LIPITOR) 40 MG tablet Take 1  tablet (40 mg total) by mouth daily at 6 PM. 90 tablet 2  . carvedilol (COREG) 3.125 MG tablet Take 1 tablet (3.125 mg total) by mouth 2 (two) times daily. 180 tablet 2  . famotidine (PEPCID) 20 MG tablet Take 20 mg by mouth daily.    . furosemide (LASIX) 40 MG tablet Take 1 tablet (40 mg total) by mouth daily. 90 tablet 2  . ondansetron (ZOFRAN) 4 MG tablet Take 1 tablet (4 mg total) by mouth every 8 (eight) hours as needed for nausea or vomiting. 10 tablet 0  . spironolactone (ALDACTONE) 25 MG tablet Take 1 tablet (25 mg total) by mouth daily. 90 tablet 2  . warfarin (COUMADIN) 3 MG tablet Take 3 mg by mouth  daily.     No current facility-administered medications for this encounter.    BP 135/64 (BP Location: Right Arm, Patient Position: Sitting, Cuff Size: Normal)   Pulse 62   Wt 192 lb 2 oz (87.1 kg)   SpO2 100%   BMI 29.21 kg/m    Wt Readings from Last 3 Encounters:  03/30/17 192 lb 2 oz (87.1 kg)  01/18/17 183 lb 8 oz (83.2 kg)  11/20/16 177 lb 3.2 oz (80.4 kg)    General: Well appearing. No resp difficulty. HEENT: normal Neck: supple. JVD 6-7. Carotids 2+ bilat; no bruits. No thyromegaly or nodule noted. Cor: PMI nondisplaced. RRR, 2/6 early SEM RUSB with clear S2. Lungs: CTAB, normal effort. Abdomen: soft, non-tender, distended, no HSM. No bruits or masses. +BS  Extremities: no cyanosis, clubbing, rash, R and LLE no edema.  Neuro: alert & orientedx3, cranial nerves grossly intact. moves all 4 extremities w/o difficulty. Affect pleasant   Assessment/Plan: 1. Aortic stenosis: S/p TAVR.  Echo on 09/07/16 showed well-seated bioprosthetic aortic valve with no significant perivalvular regurgitation. Mean gradient was mildly elevated and will have to be followed.   - Repeat echo at last visit with normal EF and AV prosthesis with peak and mean gradients through the valve of 42 and 24 mm Hg, respectively. (mildly increased from 10/2016) 2. Chronic systolic CHF: EF 62% on 08/26/16 with moderate RV dysfunction. Suspect that this was primarily valvular due to severe AS as EF improved to 50-55% range after TAVR.  He is euvolemic with NYHA class II symptoms.  - Echo 01/18/17 LVEF 60-65%, Grade 1 DD, AV proshtesis with peak and mean gradients through the valve of 42 and 24 mm Hg, respectively. (mildly increased from 10/2016). - Continue Lasix 40 mg bid. BMET today.  - Continue coreg 3.125 mg BID today.    - Continue spironolactone 25 mg daily.  - Reinforced fluid restriction to < 2 L daily, sodium restriction to less than 2000 mg daily, and the importance of daily weights.   3. Atrial  fibrillation:  - NSR today by exam.  Of note, he had an LA appendage thrombus noted by TEE during last hospitalization.  This was not noted on Echo 01/2017 - Continue amiodarone 200 mg daily. TSH recently stable. LFTs from PCP. Needs yearly eye exams. - Continue warfarin, followed by PCP (will get CBC from PCP => recently done).   4. CAD: Moderate CAD without interventional target on 10/17 cath.  - No ASA as on coumadin with stable CAD.   - Continue atorvastatin 40 mg daily. Per PCP  Pt stable and much improved from his initial encounter. He is moving to [place] next week for the foreseeable future.  We will help him with medications in  any way we can while he transitions to care up there. We will gladly see him in follow up if he returns to [facility].   Graciella Freer, PA-C  03/30/2017   Greater than 50% of the 25 minute visit was spent in counseling/coordination of care regarding disease state education, salt/fluid restriction, activity restriction, medication regimen, and importance of procuring care upon his arrival to [place].   Patient was seen with PA.  I agree with the above note and have made adjustments to it. I performed my own exam of the patient.   He is doing well overall, NYHA class II and working full time at [employer].  He is in NSR today, no significant exertional dyspnea.   Mild elevation of mean gradient across the TAVR valve.  Doubt valve thrombosis as he is on warfarin for atrial fibrillation.   He will be moving to [place] to live with his sister.  We discussed the importance of finding a new cardiologist and coumadin clinic.    Marca Ancona 03/31/2017

## 2017-03-31 NOTE — Addendum Note (Signed)
Encounter addended by: Laurey Morale, MD on: 03/31/2017 10:45 PM<BR>    Actions taken: Sign clinical note

## 2017-05-24 ENCOUNTER — Encounter (HOSPITAL_COMMUNITY): Payer: Self-pay

## 2017-05-24 NOTE — Progress Notes (Signed)
Medical record request received from Dr. Carolanne Grumbling office in [facility] for transition of cardiac care as patient has moved. All available records from our office/physicians faxed to provided number [phone] Copy of request scanned into patient's electronic medical record.  Ave Filter, RN

## 2017-08-07 ENCOUNTER — Telehealth: Payer: Self-pay

## 2017-08-07 NOTE — Telephone Encounter (Signed)
In reviewing the pt's chart he has relocated to [facility], [place] since his TAVR.  I left the pt a message to contact the office so that we can perform [facility]Q over the phone and determine if he has established with a local cardiologist.  If he has had an echocardiogram performed I will have him forward a copy to our office.

## 2017-09-09 IMAGING — DX DG CHEST 1V PORT
1 series · 1 of 1 positions shown · non-contrast
Comparison: 09/07/2016.

CLINICAL DATA: Shortness of breath.  CHF.

EXAM:
PORTABLE CHEST 1 VIEW

[chest ap]
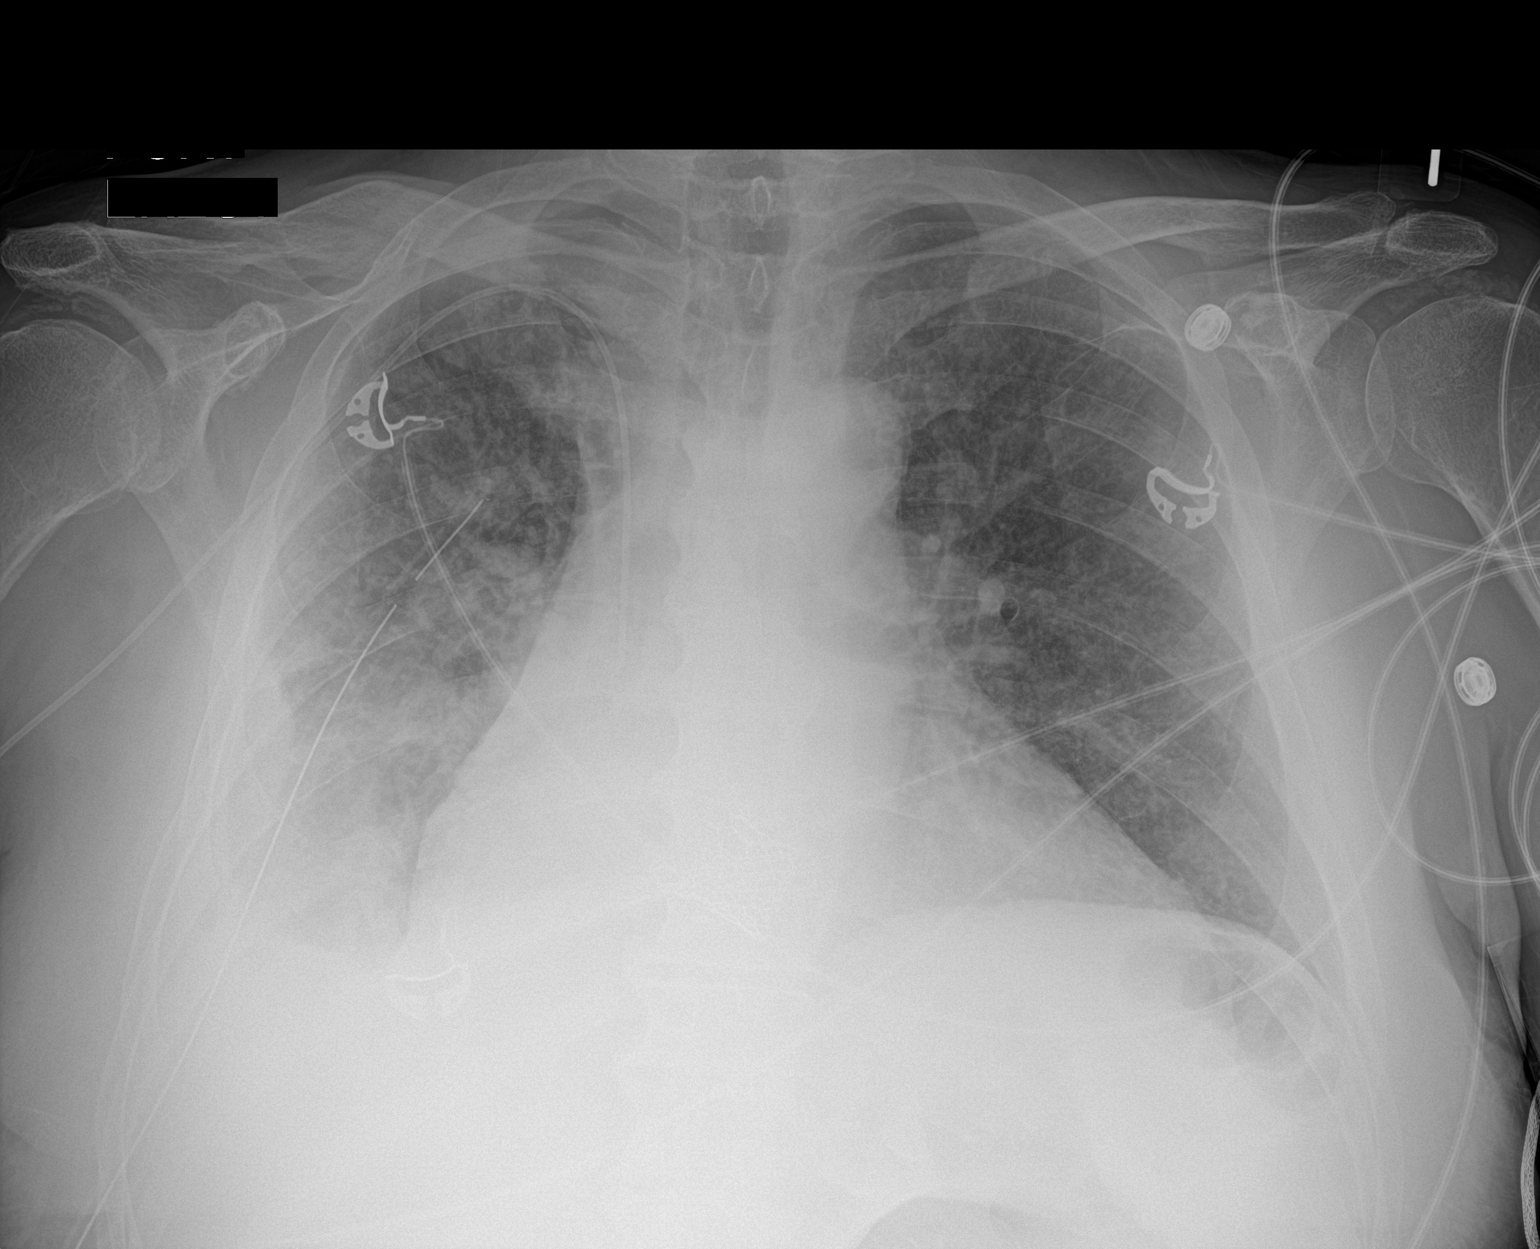

[1 of 1 positions shown; findings below may reference images not displayed]

FINDINGS: Right PICC line and right chest tube in stable position. No
pneumothorax. Right pleural effusion is improved slightly.
Cardiomegaly with diffuse pulmonary interstitial prominence mild
congestive heart failure cannot be excluded .
IMPRESSION: 1. Right PICC line right chest tube in stable position. No
pneumothorax. Right pleural effusion has improved slightly.

2. With bilateral from interstitial prominence. Mild congestive
heart failure cannot be excluded .

## 2017-09-10 IMAGING — CR DG CHEST 1V PORT
1 series · 1 of 1 positions shown · non-contrast
Comparison: 09/08/2016

CLINICAL DATA: Status post TAVR and right chest tube placement for
hemothorax.

EXAM:
PORTABLE CHEST 1 VIEW

[AP]
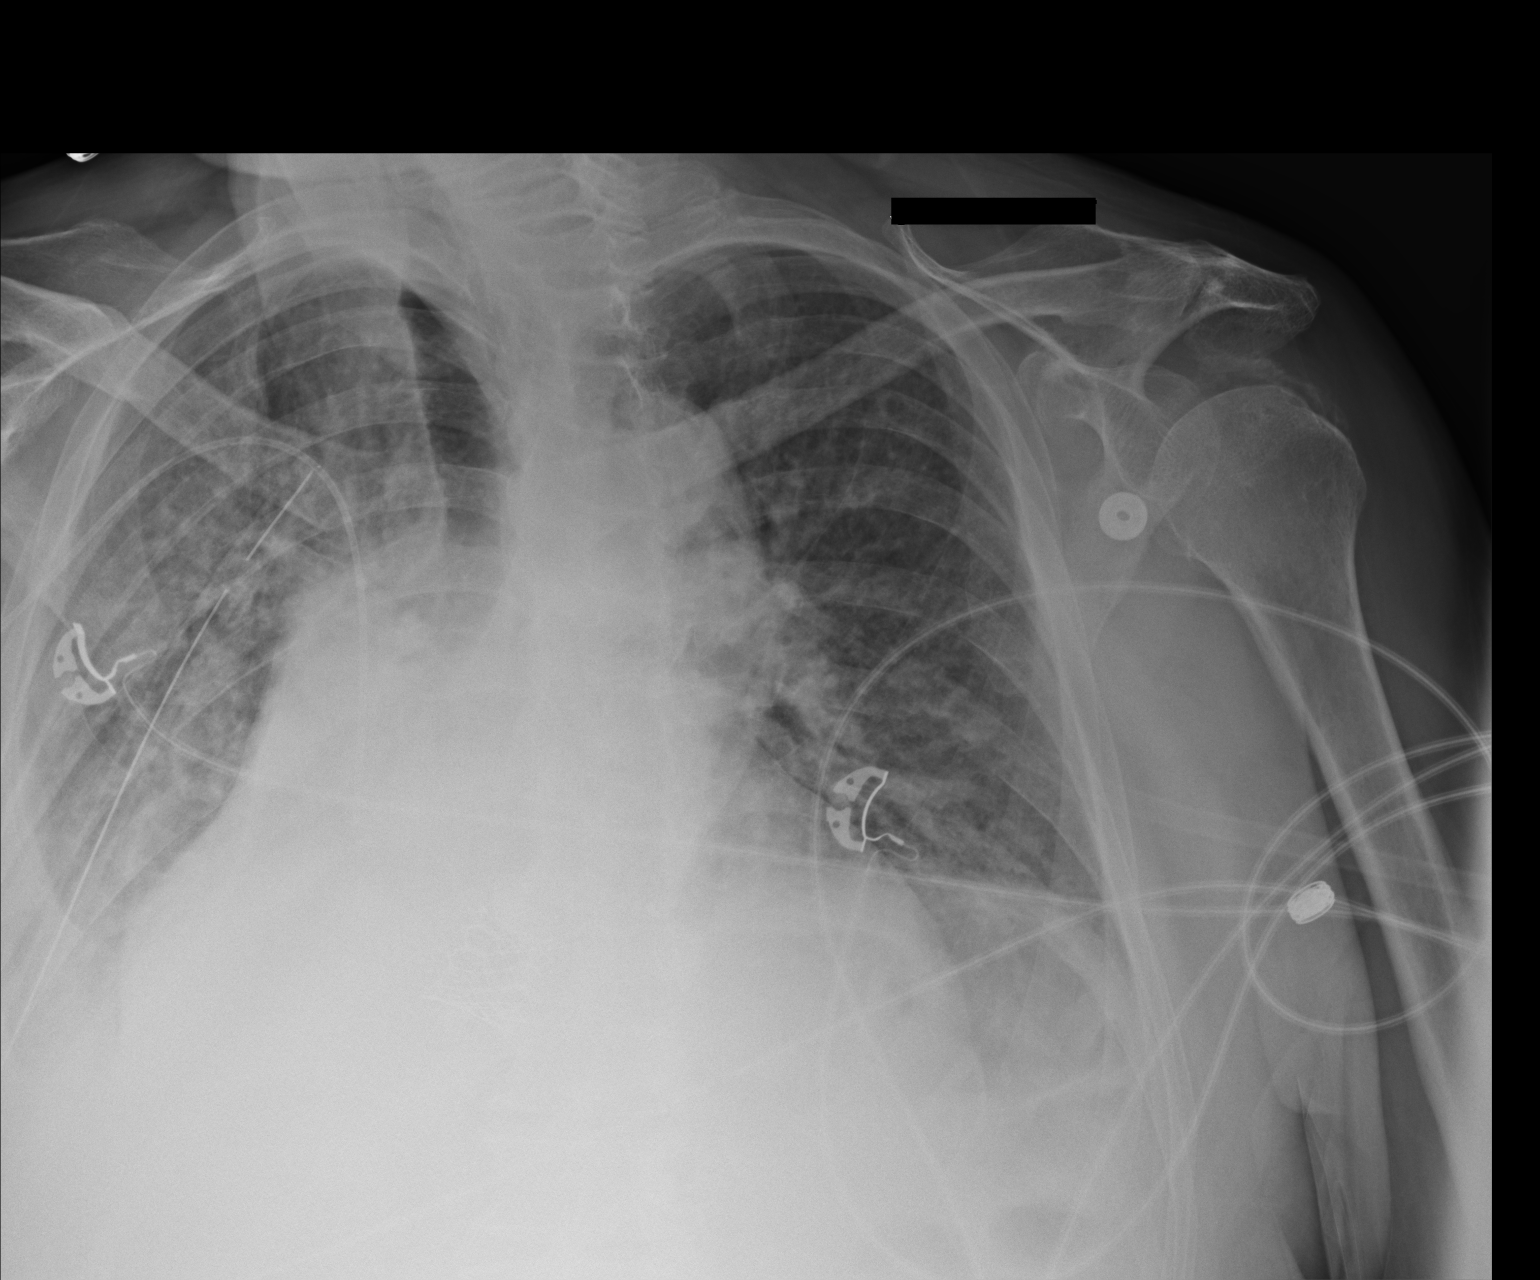

[1 of 1 positions shown; findings below may reference images not displayed]

FINDINGS: Right chest tube remains in place with no significant pleural fluid
volume. No pneumothorax. There remains atelectasis in the right
lower lung. PICC line positioning stable in the distal SVC. Stable
cardiac enlargement and appearance of transcatheter aortic valve.
Suspect mild residual interstitial edema which appears stable.
IMPRESSION: No significant residual right pleural fluid volume with atelectasis
remaining in the right lower lung. Stable mild interstitial edema.

## 2017-09-11 NOTE — Telephone Encounter (Signed)
I called the pt's phone number and it rang a few times and then was disconnected.

## 2017-09-12 IMAGING — CR DG CHEST 1V PORT
1 series · 1 of 1 positions shown · non-contrast
Comparison: Portable chest x-rays September 05, 2016 and September 10, 2016.

CLINICAL DATA: Respiratory failure, shortness of breath, status
post aortic valve replacement on September 2016.

EXAM:
PORTABLE CHEST 1 VIEW

[AP]
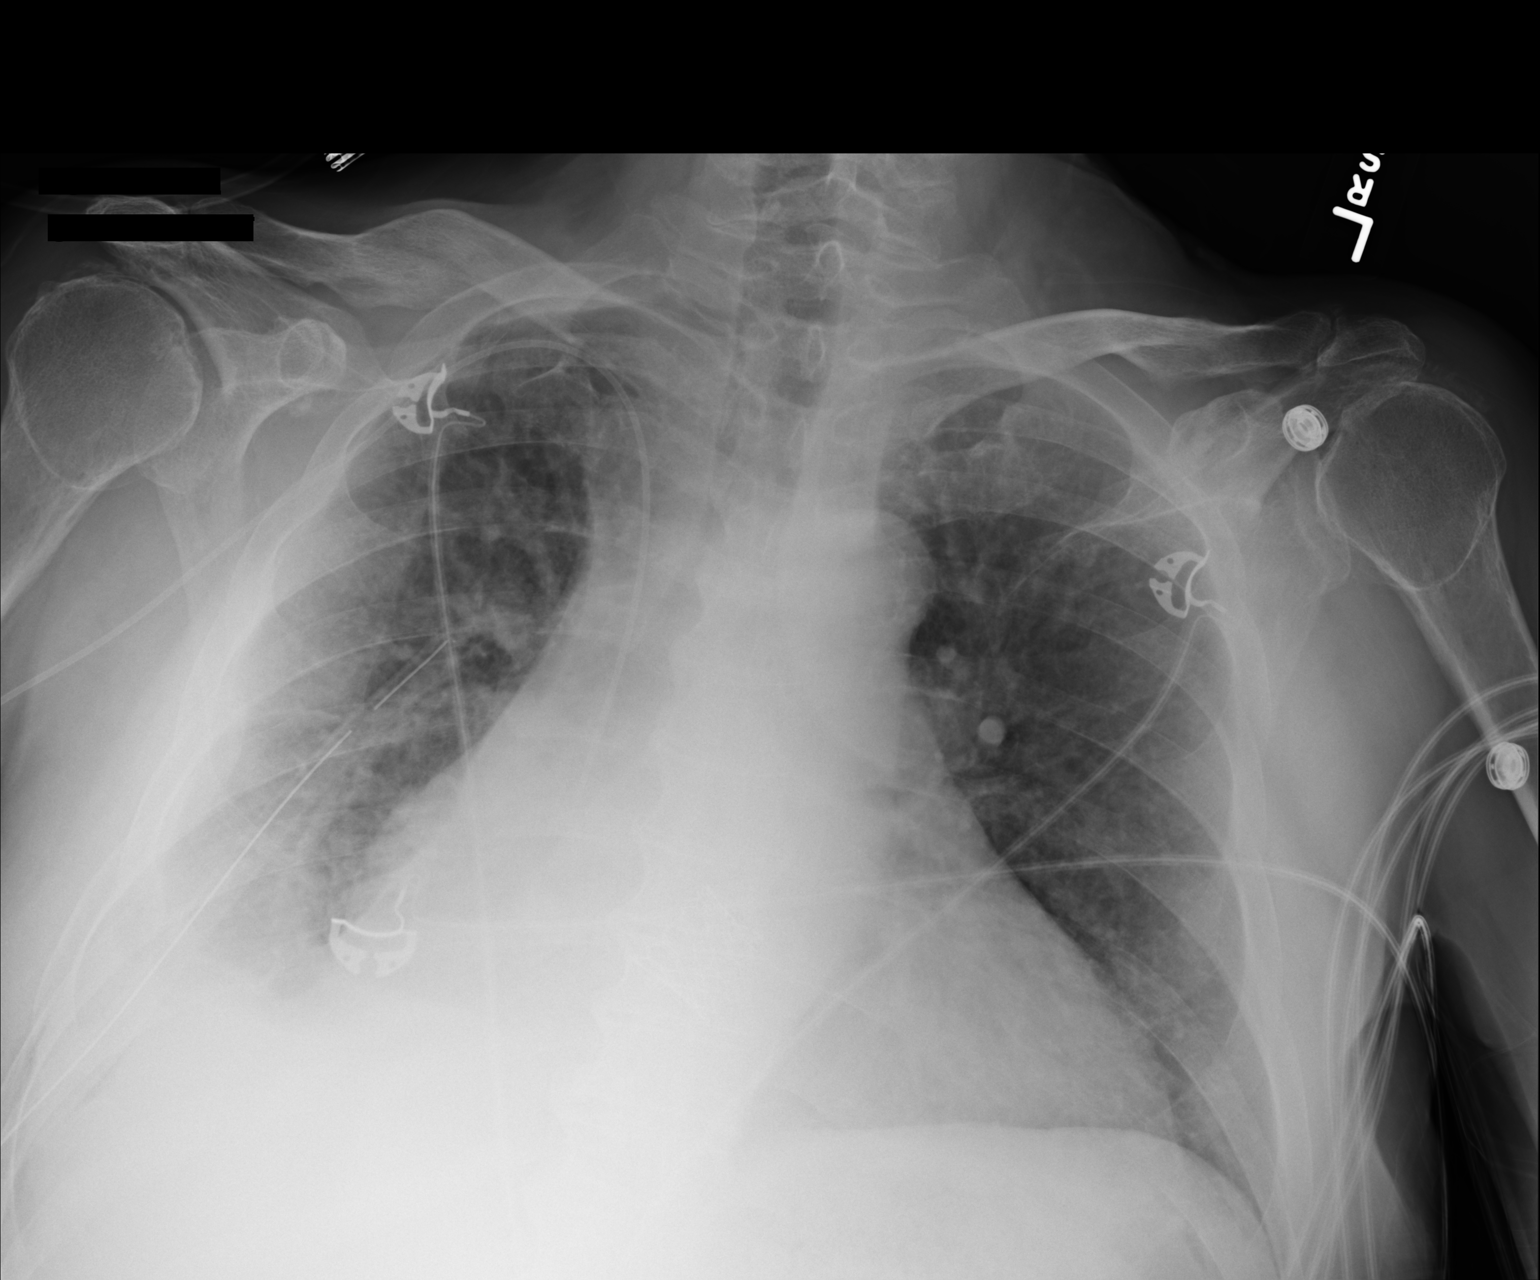

[1 of 1 positions shown; findings below may reference images not displayed]

FINDINGS: The left lung is well-expanded and clear. On the right no
pneumothorax is evident. There is a small right pleural effusion.
The interstitial markings remain prominent especially at the lung
base. The right-sided chest tube tip projects over the inferior
margin of the posterior aspect of the right sixth rib. The cardiac
silhouette remains enlarged. The pulmonary vascularity is less
engorged. There is calcification in the wall of the aortic arch.
There degenerative changes of the shoulders and there is an old
healed midshaft right clavicular fracture. The right-sided PICC line
tip projects over the midportion of the SVC.
IMPRESSION: Mild improvement in the pulmonary interstitium overall consistent
with decreasing interstitial edema. Persistent small right pleural
effusion. The right-sided chest tube is in stable position.

## 2017-09-13 IMAGING — DX DG CHEST 1V PORT
1 series · 1 of 1 positions shown · non-contrast
Comparison: 09/11/2016

CLINICAL DATA: Right chest tube removal

EXAM:
PORTABLE CHEST 1 VIEW

[chest ap]
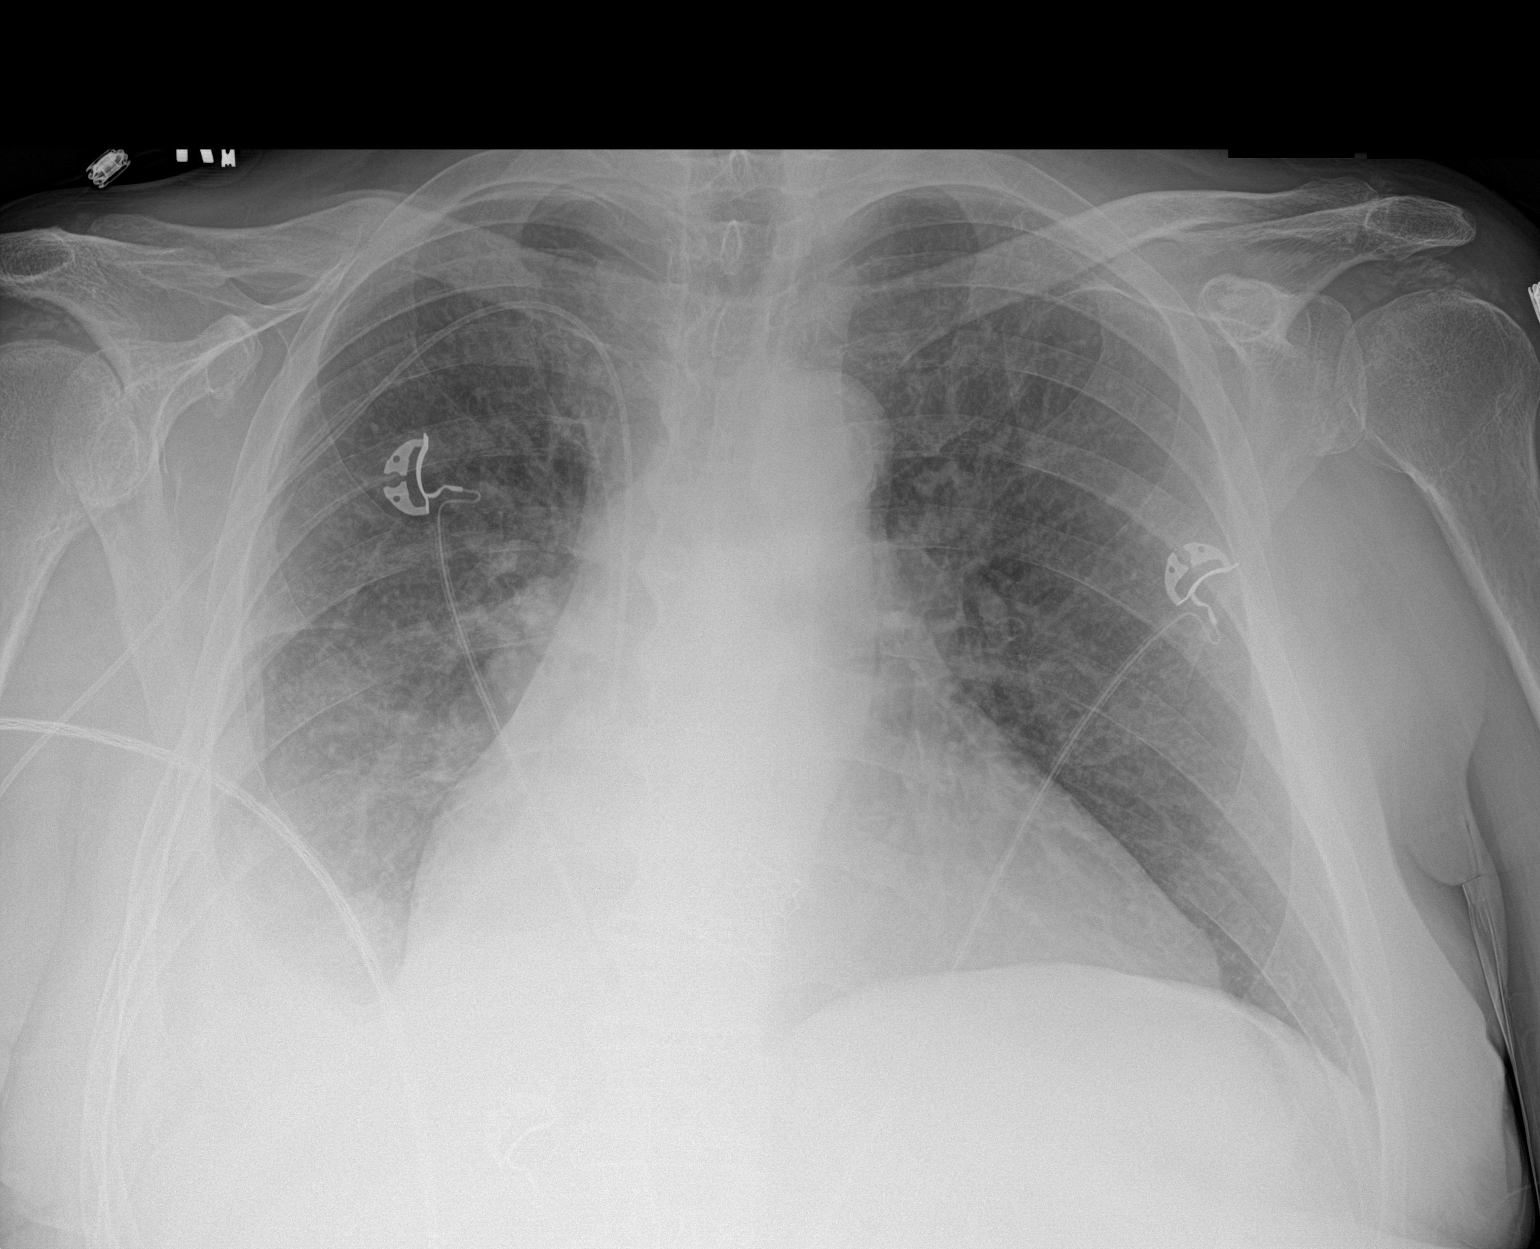

[1 of 1 positions shown; findings below may reference images not displayed]

FINDINGS: Right chest tube has been removed. There is small right pleural
effusion with right basilar atelectasis or infiltrate. No
pneumothorax. Right arm PICC line with tip in SVC right atrium
junction.
IMPRESSION: Small right pleural effusion with right basilar atelectasis or
infiltrate. Stable right arm PICC line position. No pneumothorax.

## 2017-09-14 NOTE — Telephone Encounter (Signed)
I left the pt a voicemail in regards to 1 year TAVR follow-up (TAVR performed 09/05/2016). The pt has relocated to [facility], [place] per documentation in chart.  I instructed him to call me back so that we can perform KCCQ over the phone and determine if he has established with a local cardiologist and had an echocardiogram performed. I will close this encounter at this time.
